# Patient Record
Sex: Female | Born: 1935
Health system: Southern US, Community
[De-identification: ages and names within clinical notes are randomized; demographics above are authoritative.]

## PROBLEM LIST (undated history)

## (undated) DIAGNOSIS — H35372 Puckering of macula, left eye: Secondary | ICD-10-CM

## (undated) DIAGNOSIS — E785 Hyperlipidemia, unspecified: Secondary | ICD-10-CM

## (undated) DIAGNOSIS — R51 Headache: Secondary | ICD-10-CM

## (undated) DIAGNOSIS — K222 Esophageal obstruction: Secondary | ICD-10-CM

## (undated) DIAGNOSIS — R269 Unspecified abnormalities of gait and mobility: Secondary | ICD-10-CM

## (undated) DIAGNOSIS — Z8719 Personal history of other diseases of the digestive system: Secondary | ICD-10-CM

## (undated) DIAGNOSIS — R519 Headache, unspecified: Secondary | ICD-10-CM

## (undated) DIAGNOSIS — R0600 Dyspnea, unspecified: Secondary | ICD-10-CM

## (undated) DIAGNOSIS — M810 Age-related osteoporosis without current pathological fracture: Secondary | ICD-10-CM

## (undated) DIAGNOSIS — I10 Essential (primary) hypertension: Secondary | ICD-10-CM

## (undated) DIAGNOSIS — E119 Type 2 diabetes mellitus without complications: Secondary | ICD-10-CM

## (undated) DIAGNOSIS — K219 Gastro-esophageal reflux disease without esophagitis: Secondary | ICD-10-CM

## (undated) DIAGNOSIS — M1990 Unspecified osteoarthritis, unspecified site: Secondary | ICD-10-CM

## (undated) DIAGNOSIS — I499 Cardiac arrhythmia, unspecified: Secondary | ICD-10-CM

## (undated) DIAGNOSIS — H269 Unspecified cataract: Secondary | ICD-10-CM

## (undated) DIAGNOSIS — I77810 Thoracic aortic ectasia: Secondary | ICD-10-CM

## (undated) DIAGNOSIS — H9319 Tinnitus, unspecified ear: Secondary | ICD-10-CM

## (undated) HISTORY — DX: Essential (primary) hypertension: I10

## (undated) HISTORY — DX: Unspecified abnormalities of gait and mobility: R26.9

## (undated) HISTORY — PX: APPENDECTOMY: SHX54

## (undated) HISTORY — PX: KYPHOSIS SURGERY: SHX114

## (undated) HISTORY — DX: Puckering of macula, left eye: H35.372

## (undated) HISTORY — PX: FRACTURE SURGERY: SHX138

## (undated) HISTORY — PX: BACK SURGERY: SHX140

## (undated) HISTORY — DX: Gastro-esophageal reflux disease without esophagitis: K21.9

## (undated) HISTORY — DX: Thoracic aortic ectasia: I77.810

## (undated) HISTORY — DX: Hyperlipidemia, unspecified: E78.5

## (undated) HISTORY — PX: EYE SURGERY: SHX253

## (undated) HISTORY — PX: SHOULDER SURGERY: SHX246

## (undated) HISTORY — DX: Unspecified cataract: H26.9

## (undated) HISTORY — PX: ABDOMINAL HYSTERECTOMY: SHX81

## (undated) HISTORY — DX: Esophageal obstruction: K22.2

## (undated) HISTORY — DX: Age-related osteoporosis without current pathological fracture: M81.0

## (undated) HISTORY — DX: Unspecified osteoarthritis, unspecified site: M19.90

## (undated) HISTORY — PX: TOTAL KNEE ARTHROPLASTY: SHX125

## (undated) HISTORY — DX: Type 2 diabetes mellitus without complications: E11.9

## (undated) HISTORY — PX: LUMBAR FUSION: SHX111

---

## 1991-08-26 HISTORY — PX: CHOLECYSTECTOMY: SHX55

## 1998-05-30 ENCOUNTER — Encounter: Admission: RE | Admit: 1998-05-30 | Discharge: 1998-08-28 | Payer: Self-pay | Admitting: Orthopaedic Surgery

## 1998-08-31 ENCOUNTER — Encounter: Payer: Self-pay | Admitting: Orthopaedic Surgery

## 1998-09-04 ENCOUNTER — Inpatient Hospital Stay (HOSPITAL_COMMUNITY): Admission: RE | Admit: 1998-09-04 | Discharge: 1998-09-10 | Payer: Self-pay | Admitting: Orthopaedic Surgery

## 1998-11-20 ENCOUNTER — Encounter: Admission: RE | Admit: 1998-11-20 | Discharge: 1999-02-18 | Payer: Self-pay | Admitting: Orthopaedic Surgery

## 2000-03-13 ENCOUNTER — Ambulatory Visit (HOSPITAL_COMMUNITY): Admission: RE | Admit: 2000-03-13 | Discharge: 2000-03-13 | Payer: Self-pay | Admitting: Gastroenterology

## 2001-08-24 ENCOUNTER — Ambulatory Visit (HOSPITAL_COMMUNITY): Admission: RE | Admit: 2001-08-24 | Discharge: 2001-08-24 | Payer: Self-pay | Admitting: Family Medicine

## 2001-08-24 ENCOUNTER — Encounter: Payer: Self-pay | Admitting: Family Medicine

## 2001-10-08 ENCOUNTER — Encounter: Payer: Self-pay | Admitting: Neurosurgery

## 2001-10-12 ENCOUNTER — Inpatient Hospital Stay (HOSPITAL_COMMUNITY): Admission: RE | Admit: 2001-10-12 | Discharge: 2001-10-17 | Payer: Self-pay | Admitting: Neurosurgery

## 2001-10-12 ENCOUNTER — Encounter: Payer: Self-pay | Admitting: Neurosurgery

## 2002-08-25 HISTORY — PX: KNEE ARTHROSCOPY: SUR90

## 2002-09-21 ENCOUNTER — Encounter: Payer: Self-pay | Admitting: Orthopaedic Surgery

## 2002-09-22 ENCOUNTER — Inpatient Hospital Stay (HOSPITAL_COMMUNITY): Admission: RE | Admit: 2002-09-22 | Discharge: 2002-09-27 | Payer: Self-pay | Admitting: Orthopaedic Surgery

## 2004-07-02 ENCOUNTER — Ambulatory Visit: Payer: Self-pay | Admitting: Cardiology

## 2004-07-04 ENCOUNTER — Ambulatory Visit: Payer: Self-pay

## 2007-05-18 ENCOUNTER — Encounter: Admission: RE | Admit: 2007-05-18 | Discharge: 2007-05-18 | Payer: Self-pay | Admitting: Neurosurgery

## 2007-05-22 ENCOUNTER — Encounter: Admission: RE | Admit: 2007-05-22 | Discharge: 2007-05-22 | Payer: Self-pay | Admitting: Neurosurgery

## 2007-08-05 ENCOUNTER — Ambulatory Visit: Payer: Self-pay | Admitting: Cardiology

## 2007-08-23 ENCOUNTER — Encounter: Payer: Self-pay | Admitting: Cardiology

## 2007-08-23 ENCOUNTER — Ambulatory Visit: Payer: Self-pay

## 2007-09-16 ENCOUNTER — Encounter: Admission: RE | Admit: 2007-09-16 | Discharge: 2007-09-16 | Payer: Self-pay | Admitting: Neurosurgery

## 2008-06-13 ENCOUNTER — Ambulatory Visit (HOSPITAL_COMMUNITY): Admission: RE | Admit: 2008-06-13 | Discharge: 2008-06-13 | Payer: Self-pay | Admitting: Orthopedic Surgery

## 2008-08-25 HISTORY — PX: CARDIAC CATHETERIZATION: SHX172

## 2008-12-12 DIAGNOSIS — E1169 Type 2 diabetes mellitus with other specified complication: Secondary | ICD-10-CM | POA: Insufficient documentation

## 2008-12-12 DIAGNOSIS — M1990 Unspecified osteoarthritis, unspecified site: Secondary | ICD-10-CM | POA: Insufficient documentation

## 2008-12-12 DIAGNOSIS — K219 Gastro-esophageal reflux disease without esophagitis: Secondary | ICD-10-CM

## 2008-12-12 DIAGNOSIS — E785 Hyperlipidemia, unspecified: Secondary | ICD-10-CM | POA: Insufficient documentation

## 2008-12-12 DIAGNOSIS — I152 Hypertension secondary to endocrine disorders: Secondary | ICD-10-CM | POA: Insufficient documentation

## 2008-12-12 DIAGNOSIS — I10 Essential (primary) hypertension: Secondary | ICD-10-CM

## 2008-12-13 ENCOUNTER — Ambulatory Visit: Payer: Self-pay | Admitting: Cardiology

## 2008-12-28 ENCOUNTER — Encounter: Admission: RE | Admit: 2008-12-28 | Discharge: 2008-12-28 | Payer: Self-pay | Admitting: Neurosurgery

## 2009-01-18 ENCOUNTER — Ambulatory Visit (HOSPITAL_COMMUNITY): Admission: RE | Admit: 2009-01-18 | Discharge: 2009-01-18 | Payer: Self-pay | Admitting: Neurosurgery

## 2009-03-09 ENCOUNTER — Encounter: Payer: Self-pay | Admitting: Cardiology

## 2009-04-11 ENCOUNTER — Encounter (INDEPENDENT_AMBULATORY_CARE_PROVIDER_SITE_OTHER): Payer: Self-pay | Admitting: *Deleted

## 2009-04-19 ENCOUNTER — Encounter: Admission: RE | Admit: 2009-04-19 | Discharge: 2009-04-19 | Payer: Self-pay | Admitting: Neurosurgery

## 2009-06-27 ENCOUNTER — Ambulatory Visit: Payer: Self-pay | Admitting: Cardiology

## 2009-06-27 DIAGNOSIS — R0602 Shortness of breath: Secondary | ICD-10-CM

## 2009-06-27 DIAGNOSIS — I719 Aortic aneurysm of unspecified site, without rupture: Secondary | ICD-10-CM | POA: Insufficient documentation

## 2009-12-10 ENCOUNTER — Encounter (HOSPITAL_COMMUNITY): Admission: RE | Admit: 2009-12-10 | Discharge: 2010-03-07 | Payer: Self-pay | Admitting: Family Medicine

## 2009-12-12 ENCOUNTER — Ambulatory Visit: Payer: Self-pay | Admitting: Cardiology

## 2009-12-12 DIAGNOSIS — I2510 Atherosclerotic heart disease of native coronary artery without angina pectoris: Secondary | ICD-10-CM

## 2010-01-03 ENCOUNTER — Ambulatory Visit: Payer: Self-pay

## 2010-01-03 ENCOUNTER — Ambulatory Visit: Payer: Self-pay | Admitting: Internal Medicine

## 2010-01-03 ENCOUNTER — Ambulatory Visit (HOSPITAL_COMMUNITY): Admission: RE | Admit: 2010-01-03 | Discharge: 2010-01-03 | Payer: Self-pay | Admitting: Cardiology

## 2010-01-03 ENCOUNTER — Encounter: Payer: Self-pay | Admitting: Cardiology

## 2010-02-20 ENCOUNTER — Encounter (INDEPENDENT_AMBULATORY_CARE_PROVIDER_SITE_OTHER): Payer: Self-pay | Admitting: Internal Medicine

## 2010-02-20 ENCOUNTER — Inpatient Hospital Stay (HOSPITAL_COMMUNITY): Admission: EM | Admit: 2010-02-20 | Discharge: 2010-02-21 | Payer: Self-pay | Admitting: Emergency Medicine

## 2010-02-20 ENCOUNTER — Ambulatory Visit: Payer: Self-pay | Admitting: Internal Medicine

## 2010-02-21 ENCOUNTER — Telehealth: Payer: Self-pay | Admitting: Cardiology

## 2010-02-26 ENCOUNTER — Encounter: Payer: Self-pay | Admitting: Cardiology

## 2010-02-27 ENCOUNTER — Ambulatory Visit: Payer: Self-pay | Admitting: Cardiology

## 2010-02-27 DIAGNOSIS — R002 Palpitations: Secondary | ICD-10-CM

## 2010-03-05 ENCOUNTER — Telehealth (INDEPENDENT_AMBULATORY_CARE_PROVIDER_SITE_OTHER): Payer: Self-pay | Admitting: *Deleted

## 2010-03-06 ENCOUNTER — Encounter: Payer: Self-pay | Admitting: Cardiology

## 2010-03-06 ENCOUNTER — Ambulatory Visit: Payer: Self-pay

## 2010-03-06 ENCOUNTER — Encounter (HOSPITAL_COMMUNITY): Admission: RE | Admit: 2010-03-06 | Discharge: 2010-05-01 | Payer: Self-pay | Admitting: Cardiology

## 2010-03-06 ENCOUNTER — Ambulatory Visit: Payer: Self-pay | Admitting: Cardiology

## 2010-03-18 ENCOUNTER — Telehealth: Payer: Self-pay | Admitting: Cardiology

## 2010-09-24 NOTE — Progress Notes (Signed)
Summary: Nuclear Pre-Procedure  Phone Note Outgoing Call Call back at [facility] Phone [phone]   Call placed by: Stanton Kidney, EMT-P,  March 05, 2010 2:45 PM Call placed to: Patient Action Taken: Phone Call Completed Summary of Call: Reviewed information on Myoview Information Sheet (see scanned document for further details).  Spoke with Patient.    Nuclear Med Background Indications for Stress Test: Evaluation for Ischemia, Post Hospital  Indications Comments: 02/19/10  CP/Palps  History: Abnormal EKG, Echo, Myocardial Perfusion Study  History Comments: 12/08 MPS: EF=75%, (-) ischemia 5/11 Echo=50%  Symptoms: Chest Pressure, Palpitations    Nuclear Pre-Procedure Cardiac Risk Factors: Family History - CAD, Hypertension, Lipids Height (in): 65

## 2010-09-24 NOTE — Progress Notes (Signed)
Summary: test results  Phone Note Call from Patient Call back at Home Phone [phone]   Caller: Patient Summary of Call: test results Initial call taken by: Judie Grieve,  March 18, 2010 10:34 AM  Follow-up for Phone Call        Phone Call Completed. PT AWARE OF MYOVIEW RESULTS. Follow-up by: Scherrie Bateman, LPN,  March 18, 2010 10:59 AM

## 2010-09-24 NOTE — Assessment & Plan Note (Signed)
Summary: [facility]   Visit Type:  Follow-up Primary Provider:  Dr Vernon Prey  CC:  palpitations.  History of Present Illness: The patient presents after her recent hospitalization. She was admitted with palpitations and was noted to have PACs. She did have some chest discomfort and chest pressure as well. An outpatient stress perfusion study was suggested but has not yet been done. She did have an echocardiogram suggesting that her ejection fraction was slightly reduced at 50%. 6 weeks earlier this had been 60%. She did rule out for myocardial infarction however. Since going home she has had no further palpitations and no presyncope or syncope. She has had no chest pressure, neck or arm discomfort. She does feel "jittery". She has had some intermittent chest pressure but cannot quantify or qualify this. She has not been particularly active. Of note she took herself off her antidepressant recently as she "didn't think she needed it."  Current Medications (verified): 1)  Diltiazem Hcl Cr 180 Mg Xr24h-Cap (Diltiazem Hcl) .Marland Kitchen.. 1 By Mouth Daily 2)  Lisinopril-Hydrochlorothiazide 20-12.5 Mg Tabs (Lisinopril-Hydrochlorothiazide) .Marland Kitchen.. 1 By Mouth Daily 3)  Citalopram Hydrobromide 20 Mg Tabs (Citalopram Hydrobromide) .Marland Kitchen.. 1 By Mouth Daily 4)  Furosemide 20 Mg Tabs (Furosemide) .... As Needed 5)  Aspirin 81 Mg  Tabs (Aspirin) .Marland Kitchen.. 1 By Mouth Daily 6)  Fish Oil 1000 Mg Caps (Omega-3 Fatty Acids) .... 2 By Mouth Daily 7)  Calcium 600 Mg Tabs (Calcium) .... Daily 8)  Multivitamins   Tabs (Multiple Vitamin) .Marland Kitchen.. 1 By Mouth Daily 9)  Vitamin C 500 Mg Tabs (Ascorbic Acid) .Marland Kitchen.. 1 By Mouth Daily 10)  Vitamin D3 1000 Unit Tabs (Cholecalciferol) .... 2 By Mouth Daily 11)  Cinnamon 500 Mg Caps (Cinnamon) .... 2 By Mouth Daily 12)  Dexilant 60 Mg Cpdr (Dexlansoprazole) .Marland Kitchen.. 1 By Mouth Daily 13)  Glucophage 500 Mg Tabs (Metformin Hcl) .Marland Kitchen.. 1 By Mouth Two Times A Day 14)  Crestor 5 Mg Tabs (Rosuvastatin  Calcium) .Marland Kitchen.. 1 By Mouth Daily 15)  Tramadol Hcl 50 Mg Tabs (Tramadol Hcl) .Marland Kitchen.. 1 By Mouth As Needed  Allergies (verified): 1)  ! Sulfa 2)  ! Codeine 3)  ! Vancomycin  Past History:  Past Medical History: Reviewed history from 06/27/2009 and no changes required.  1. Hypertension.   2. Esophageal stricture status post dilatation.   3. Osteoarthritis.   4. Gastroesophageal reflux disease.   5. Right breast cyst.   6. Dyslipidemia.   7. Mild Ao root dilatation  Past Surgical History: Reviewed history from 12/12/2009 and no changes required. Partial hysterectomy Cholecystectomy Left  total knee replacement, Right shoulder surgery Right and left knee arthroscopies Lumbar back fusion 2000 Kyphoplasty  Review of Systems       As stated in the HPI and negative for all other systems.   Vital Signs:  Patient profile:   77 year old female Height:      65 inches Weight:      174 pounds BMI:     29.06 Pulse rate:   87 / minute Resp:     18 per minute BP sitting:   124 / 78  (right arm)  Vitals Entered By: Marrion Coy, CNA (February 27, 2010 2:11 PM)  Physical Exam  General:  Well developed, well nourished, in no acute distress. Head:  normocephalic and atraumatic Eyes:  PERRLA/EOM intact; conjunctiva and lids normal. Mouth:  edentulousgums and palate normal. Oral mucosa normal. Neck:  Neck supple, no JVD. No masses, thyromegaly  or abnormal cervical nodes. Chest Wall:  no deformities or breast masses noted Lungs:  Clear bilaterally to auscultation and percussion. Abdomen:  Bowel sounds positive; abdomen soft and non-tender without masses, organomegaly, or hernias noted. No hepatosplenomegaly. Msk:  Back normal, normal gait. Muscle strength and tone normal. Extremities:  No clubbing or cyanosis. Neurologic:  Alert and oriented x 3. Skin:  Intact without lesions or rashes. Cervical Nodes:  no significant adenopathy Inguinal Nodes:  no significant adenopathy Psych:   depressed affect.  depressed affect.     Detailed Cardiovascular Exam  Neck    Carotids: Carotids full and equal bilaterally without bruits.      Neck Veins: Normal, no JVD.    Heart    Inspection: no deformities or lifts noted.      Palpation: normal PMI with no thrills palpable.      Auscultation: regular rate and rhythm, S1, S2 without murmurs, rubs, gallops, or clicks.    Vascular    Abdominal Aorta: no palpable masses, pulsations, or audible bruits.      Femoral Pulses: normal femoral pulses bilaterally.      Pedal Pulses: normal pedal pulses bilaterally.      Radial Pulses: normal radial pulses bilaterally.      Peripheral Circulation: no clubbing, cyanosis, or edema noted with normal capillary refill.     EKG  Procedure date:  02/27/2010  Findings:      Srhythm, rate 87, leftward axis, anteroseptal infarct  Impression & Recommendations:  Problem # 1:  CAD (ICD-414.00) Tt has had some chest discomfort and the EF is reduced apparently on the most recent echo. Therefore, stress perfusion imaging is indicated to see if there are any high risk findings.  Orders: EKG w/ Interpretation (93000) Nuclear Stress Test (Nuc Stress Test)  Problem # 2:  AORTIC ANEURYSM (ICD-441.9) She had very mild aortic root dilatation on the last echo. No further evaluation is suggested.  Problem # 3:  PALPITATIONS (ICD-785.1) Her palpitations have improved. Therefore, we will continue on the meds as listed.  Problem # 4:  HYPERTENSION (ICD-401.9) Her blood pressure is controlled. She will continue the meds as listed. Orders: EKG w/ Interpretation (93000)  Patient Instructions: 1)  Your physician recommends that you schedule a follow-up appointment in: 6 month  2)  Your physician recommends that you continue on your current medications as directed. Please refer to the Current Medication list given to you today. 3)  Your physician has requested that you have an exercise stress myoview.   For further information please visit [email or web address]  Please follow instruction sheet, as given.

## 2010-09-24 NOTE — Assessment & Plan Note (Signed)
Summary: rov/jml   Visit Type:  Follow-up Primary Provider:  Dr Vernon Prey  CC:  Ao Aneurysm and Coronary Calcification.  History of Present Illness: The patient presents for followup of the above. Since I last saw her she has had no new cardiovascular complaints. She has occasional chest pain but thinks it's related to her hiatal hernia. She has some shortness of breath with activities but this has not progressed. She is able to do her chores of daily living. She denies any resting complaints such as PND or orthopnea. She denies any palpitations, presyncope or syncope. She does have emotional stress as one of her daughters is battling breast cancer.  Current Medications (verified): 1)  Diltiazem Hcl Cr 180 Mg Xr24h-Cap (Diltiazem Hcl) .Marland Kitchen.. 1 By Mouth Daily 2)  Lisinopril-Hydrochlorothiazide 20-12.5 Mg Tabs (Lisinopril-Hydrochlorothiazide) .Marland Kitchen.. 1 By Mouth Daily 3)  Citalopram Hydrobromide 20 Mg Tabs (Citalopram Hydrobromide) .... 1/2 By Mouth Daily 4)  Furosemide 20 Mg Tabs (Furosemide) .... As Needed 5)  Aspirin 81 Mg  Tabs (Aspirin) .Marland Kitchen.. 1 By Mouth Daily 6)  Fish Oil 1000 Mg Caps (Omega-3 Fatty Acids) .... 2 By Mouth Daily 7)  Calcium 600 Mg Tabs (Calcium) .... Daily 8)  Multivitamins   Tabs (Multiple Vitamin) .Marland Kitchen.. 1 By Mouth Daily 9)  Vitamin C 500 Mg Tabs (Ascorbic Acid) .Marland Kitchen.. 1 By Mouth Daily 10)  Vitamin D3 1000 Unit Tabs (Cholecalciferol) .... 2 By Mouth Daily 11)  Cinnamon 500 Mg Caps (Cinnamon) .... 2 By Mouth Daily 12)  Dexilant 60 Mg Cpdr (Dexlansoprazole) .Marland Kitchen.. 1 By Mouth Daily 13)  Glucophage 500 Mg Tabs (Metformin Hcl) .Marland Kitchen.. 1 By Mouth Daily  Allergies (verified): 1)  ! Sulfa 2)  ! Codeine 3)  ! Vancomycin  Past History:  Past Medical History: Reviewed history from 06/27/2009 and no changes required.  1. Hypertension.   2. Esophageal stricture status post dilatation.   3. Osteoarthritis.   4. Gastroesophageal reflux disease.   5. Right breast cyst.   6. Dyslipidemia.    7. Mild Ao root dilatation  Past Surgical History: Partial hysterectomy Cholecystectomy Left  total knee replacement, Right shoulder surgery Right and left knee arthroscopies Lumbar back fusion 2000 Kyphoplasty  Review of Systems       Back Pain, Neck Pain, Fatigue,Stress.  Otherwise as stated in the history of present illness negative for all other systems.  Vital Signs:  Patient profile:   77 year old female Height:      65 inches Weight:      180 pounds BMI:     30.06 Pulse rate:   83 / minute Resp:     18 per minute BP sitting:   142 / 78  (right arm)  Vitals Entered By: Marrion Coy, CNA (December 12, 2009 3:13 PM)  Physical Exam  General:  Well developed, well nourished, in no acute distress. Head:  normocephalic and atraumatic Eyes:  PERRLA/EOM intact; conjunctiva and lids normal. Neck:  Neck supple, no JVD. No masses, thyromegaly or abnormal cervical nodes. Chest Wall:  no deformities or breast masses noted Lungs:  Clear bilaterally to auscultation and percussion. Heart:  Non-displaced PMI, chest non-tender; regular rate and rhythm, S1, S2 without murmurs, rubs or gallops. Carotid upstroke normal, no bruit. Normal abdominal aortic size, no bruits. Femorals normal pulses, no bruits. Pedals normal pulses. No edema, no varicosities. Abdomen:  Bowel sounds positive; abdomen soft and non-tender without masses, organomegaly, or hernias noted. No hepatosplenomegaly. Msk:  Back normal, normal gait. Muscle strength  and tone normal. Extremities:  No clubbing or cyanosis. Neurologic:  Alert and oriented x 3. Psych:  Normal affect.   EKG  Procedure date:  12/12/2009  Findings:      sinus rhythm, rate 83, leftward axis, poor anterior R-wave progression, no acute ST-T wave changes  Impression & Recommendations:  Problem # 1:  AORTIC ANEURYSM (ICD-441.9) The patient has a mild aortic root dilatation which I will keep benign with an echocardiogram. I did ask the question  as to whether she would even be interested in surgery if it ever came to that and she said she would consider it.  Problem # 2:  HYPERTENSION (ICD-401.9) Her blood pressure is at the upper limits of normal. She will continue the meds as listed. Orders: Echocardiogram (Echo)  Problem # 3:  CAD (ICD-414.00) She has some mild coronary calcification. She had a stress perfusion study in 2008 and no new symptoms. No further testing is suggested.  Other Orders: EKG w/ Interpretation (93000)  Patient Instructions: 1)  Your physician recommends that you schedule a follow-up appointment in: 1 year with Dr Antoine Poche in [place] 2)  Your physician recommends that you continue on your current medications as directed. Please refer to the Current Medication list given to you today. 3)  Your physician has requested that you have an echocardiogram.  Echocardiography is a painless test that uses sound waves to create images of your heart. It provides your doctor with information about the size and shape of your heart and how well your heart's chambers and valves are working.  This procedure takes approximately one hour. There are no restrictions for this procedure.

## 2010-09-24 NOTE — Assessment & Plan Note (Signed)
Summary: Cardiology Nuclear Study  Nuclear Med Background Indications for Stress Test: Evaluation for Ischemia, Post Hospital  Indications Comments: 02/19/10  Chest pain/palps  History: Abnormal EKG, Echo, Myocardial Perfusion Study  History Comments: '08 MPS:no ischemia, EF=75%; 5/11 Echo=50%; h/o AAA  Symptoms: Chest Pressure, Chest Pressure with Exertion, Diaphoresis, Dizziness, DOE, Fatigue, Palpitations  Symptoms Comments: Last episode of WG:NFAO since D/C.   Nuclear Pre-Procedure Cardiac Risk Factors: Family History - CAD, Hypertension, Lipids, NIDDM, PVD Caffeine/Decaff Intake: None NPO After: 8:00 PM Lungs: Clear.  O2 Sat 98% on RA. IV 0.9% NS with Angio Cath: 24g     IV Site: (R) Wrist IV Started by: Irean Hong RN Chest Size (in) 40     Cup Size B     Height (in): 65 Weight (lb): 171 BMI: 28.56  Nuclear Med Study 1 or 2 day study:  1 day     Stress Test Type:  Eugenie Birks Reading MD:  Marca Ancona, MD     Referring MD:  Rollene Rotunda, MD Resting Radionuclide:  Technetium 66m Tetrofosmin     Resting Radionuclide Dose:  11.0 mCi  Stress Radionuclide:  Technetium 66m Tetrofosmin     Stress Radionuclide Dose:  33.0 mCi   Stress Protocol   Lexiscan: 0.4 mg   Stress Test Technologist:  Rea College CMA-N     Nuclear Technologist:  Domenic Polite CNMT  Rest Procedure  Myocardial perfusion imaging was performed at rest 45 minutes following the intravenous administration of Myoview Technetium 47m Tetrofosmin.  Stress Procedure  The patient received IV Lexiscan 0.4 mg over 15-seconds.  Myoview injected at 30-seconds.  There were no significant changes with infusion.  She did c/o chest tightness and jaw pain with infusion.  Quantitative spect images were obtained after a 45 minute delay.  QPS Raw Data Images:  Normal; no motion artifact; normal heart/lung ratio. Stress Images:  NI: Uniform and normal uptake of tracer in all myocardial segments. Rest Images:  Normal  homogeneous uptake in all areas of the myocardium. Subtraction (SDS):  There is no evidence of scar or ischemia. Transient Ischemic Dilatation:  1.0  (Normal <1.22)  Lung/Heart Ratio:  .27  (Normal <0.45)  Quantitative Gated Spect Images QGS EDV:  76 ml QGS ESV:  20 ml QGS EF:  74 % QGS cine images:  Normal wall motion.    Overall Impression  Exercise Capacity: Lexiscan study.  BP Response: Normal blood pressure response. Clinical Symptoms: Chest tightness.  ECG Impression: No significant ST segment change suggestive of ischemia. Overall Impression: Normal stress nuclear study.  Appended Document: Cardiology Nuclear Study  lmtc 3:45 03/11/2010 No evidence of ischemia.

## 2010-09-24 NOTE — Miscellaneous (Signed)
  Clinical Lists Changes  Observations: Added new observation of ECHOINTERP:  - Left ventricle: Wall thickness was at the upper limits of normal.       Systolic function was normal. The estimated ejection fraction was       in the range of 55% to 60%. Wall motion was normal; there were no       regional wall motion abnormalities. Doppler parameters are       consistent with abnormal left ventricular relaxation (grade 1       diastolic dysfunction). Doppler parameters are consistent with       high ventricular filling pressure.     - Left atrium: The atrium was mildly dilated.     - Atrial septum: There was an atrial septal aneurysm.        (01/03/2010 9:41)      Echocardiogram  Procedure date:  01/03/2010  Findings:       - Left ventricle: Wall thickness was at the upper limits of normal.       Systolic function was normal. The estimated ejection fraction was       in the range of 55% to 60%. Wall motion was normal; there were no       regional wall motion abnormalities. Doppler parameters are       consistent with abnormal left ventricular relaxation (grade 1       diastolic dysfunction). Doppler parameters are consistent with       high ventricular filling pressure.     - Left atrium: The atrium was mildly dilated.     - Atrial septum: There was an atrial septal aneurysm.

## 2010-09-24 NOTE — Progress Notes (Signed)
Summary: still havinf palpatations since hospital d/c  Phone Note Call from Patient   Caller: Daughter Patsi Sears [phone] Reason for Call: Talk to Nurse Summary of Call: pt d/c from hosptial w/note to callto set up 1-2 week follow up with dr Tamala Julian is booked and the pa couldn't see her until 7-28-dtr said pt still having the palpatations-what to do? dtr wanda east [phone] Initial call taken by: Glynda Jaeger,  February 21, 2010 4:55 PM  Follow-up for Phone Call        spoke with daughter d/c from [facility] 02/21/10 with instructions to see Dr Antoine Poche in 1-2 weeks. Our schedulers told her it would be the end of the month before he could see her.  I will foward to [facility] and let her call the daughter back tomorrow with a time Dennis Bast, RN, BSN  February 21, 2010 6:43 PM  Additional Follow-up for Phone Call Additional follow up Details #1::        pt can be seen 7/18 At 9 am with Dr Antoine Poche in [place] or if she wants to be seen in [place] we can see her 02/27/10 at 10:45.  Marcelino Duster will call pt/family Additional Follow-up by: Charolotte Capuchin, RN,  February 22, 2010 2:44 PM

## 2010-11-10 LAB — DIFFERENTIAL
Basophils Relative: 1 % (ref 0–1)
Eosinophils Absolute: 0.1 10*3/uL (ref 0.0–0.7)
Neutro Abs: 7.1 10*3/uL (ref 1.7–7.7)
Neutrophils Relative %: 55 % (ref 43–77)

## 2010-11-10 LAB — GLUCOSE, CAPILLARY
Glucose-Capillary: 129 mg/dL — ABNORMAL HIGH (ref 70–99)
Glucose-Capillary: 134 mg/dL — ABNORMAL HIGH (ref 70–99)
Glucose-Capillary: 139 mg/dL — ABNORMAL HIGH (ref 70–99)

## 2010-11-10 LAB — POCT CARDIAC MARKERS: CKMB, poc: <1 ng/mL — ABNORMAL LOW (ref 1.0–8.0)

## 2010-11-10 LAB — CBC
Hemoglobin: 14.3 g/dL (ref 12.0–15.0)
MCH: 30.7 pg (ref 26.0–34.0)
MCH: 31.4 pg (ref 26.0–34.0)
MCHC: 34.4 g/dL (ref 30.0–36.0)
MCV: 91.2 fL (ref 78.0–100.0)
Platelets: 237 10*3/uL (ref 150–400)
RBC: 4.05 MIL/uL (ref 3.87–5.11)
RBC: 4.64 MIL/uL (ref 3.87–5.11)
RDW: 15.2 % (ref 11.5–15.5)

## 2010-11-10 LAB — POCT I-STAT, CHEM 8
Calcium, Ion: 1.02 mmol/L — ABNORMAL LOW (ref 1.12–1.32)
Creatinine, Ser: 1.1 mg/dL (ref 0.4–1.2)
Hemoglobin: 14.6 g/dL (ref 12.0–15.0)
Potassium: 3.9 mEq/L (ref 3.5–5.1)
TCO2: 22 mmol/L (ref 0–100)

## 2010-11-10 LAB — CARDIAC PANEL(CRET KIN+CKTOT+MB+TROPI)
CK, MB: 2.8 ng/mL (ref 0.3–4.0)
Relative Index: 2.5 (ref 0.0–2.5)
Total CK: 107 U/L (ref 7–177)
Troponin I: 0.01 ng/mL (ref 0.00–0.06)
Troponin I: 0.02 ng/mL (ref 0.00–0.06)

## 2010-11-10 LAB — MRSA PCR SCREENING: MRSA by PCR: NEGATIVE

## 2010-11-10 LAB — CK TOTAL AND CKMB (NOT AT ARMC): Relative Index: 2.5 (ref 0.0–2.5)

## 2010-11-10 LAB — TSH: TSH: 3.607 u[IU]/mL (ref 0.350–4.500)

## 2010-11-10 LAB — BASIC METABOLIC PANEL
CO2: 28 mEq/L (ref 19–32)
Calcium: 8.3 mg/dL — ABNORMAL LOW (ref 8.4–10.5)
Creatinine, Ser: 0.8 mg/dL (ref 0.4–1.2)
GFR calc Af Amer: >60 mL/min (ref >60)
Glucose, Bld: 138 mg/dL — ABNORMAL HIGH (ref 70–99)

## 2010-11-10 LAB — TROPONIN I: Troponin I: 0.01 ng/mL (ref 0.00–0.06)

## 2010-12-03 LAB — BASIC METABOLIC PANEL
BUN: 13 mg/dL (ref 6–23)
CO2: 26 mEq/L (ref 19–32)
Chloride: 101 mEq/L (ref 96–112)
Creatinine, Ser: 0.83 mg/dL (ref 0.4–1.2)

## 2010-12-03 LAB — CBC
MCHC: 34.4 g/dL (ref 30.0–36.0)
MCV: 90.2 fL (ref 78.0–100.0)
Platelets: 301 10*3/uL (ref 150–400)

## 2010-12-24 ENCOUNTER — Encounter (HOSPITAL_BASED_OUTPATIENT_CLINIC_OR_DEPARTMENT_OTHER)
Admission: RE | Admit: 2010-12-24 | Discharge: 2010-12-24 | Disposition: A | Discharge status: Home / Self Care | Payer: Medicare Other | Source: Ambulatory Visit | Attending: Orthopedic Surgery | Admitting: Orthopedic Surgery

## 2010-12-24 HISTORY — PX: RADIAL HEAD ARTHROPLASTY: SUR75

## 2010-12-24 LAB — BASIC METABOLIC PANEL
BUN: 15 mg/dL (ref 6–23)
CO2: 23 mEq/L (ref 19–32)
Calcium: 9.5 mg/dL (ref 8.4–10.5)
Chloride: 102 mEq/L (ref 96–112)
Creatinine, Ser: 0.78 mg/dL (ref 0.4–1.2)
GFR calc Af Amer: >60 mL/min (ref >60)
GFR calc non Af Amer: >60 mL/min (ref >60)
Glucose, Bld: 97 mg/dL (ref 70–99)
Potassium: 3.7 mEq/L (ref 3.5–5.1)
Sodium: 139 mEq/L (ref 135–145)

## 2010-12-25 ENCOUNTER — Ambulatory Visit (HOSPITAL_BASED_OUTPATIENT_CLINIC_OR_DEPARTMENT_OTHER)
Admission: RE | Admit: 2010-12-25 | Discharge: 2010-12-25 | Disposition: A | Discharge status: Home / Self Care | Payer: Medicare Other | Source: Ambulatory Visit | Attending: Orthopedic Surgery | Admitting: Orthopedic Surgery

## 2010-12-25 DIAGNOSIS — Z01818 Encounter for other preprocedural examination: Secondary | ICD-10-CM | POA: Insufficient documentation

## 2010-12-25 DIAGNOSIS — S52123A Displaced fracture of head of unspecified radius, initial encounter for closed fracture: Secondary | ICD-10-CM | POA: Insufficient documentation

## 2010-12-25 DIAGNOSIS — Z01812 Encounter for preprocedural laboratory examination: Secondary | ICD-10-CM | POA: Insufficient documentation

## 2010-12-25 DIAGNOSIS — I10 Essential (primary) hypertension: Secondary | ICD-10-CM | POA: Insufficient documentation

## 2010-12-25 DIAGNOSIS — E119 Type 2 diabetes mellitus without complications: Secondary | ICD-10-CM | POA: Insufficient documentation

## 2010-12-25 DIAGNOSIS — Z79899 Other long term (current) drug therapy: Secondary | ICD-10-CM | POA: Insufficient documentation

## 2010-12-25 DIAGNOSIS — X58XXXA Exposure to other specified factors, initial encounter: Secondary | ICD-10-CM | POA: Insufficient documentation

## 2010-12-25 LAB — GLUCOSE, CAPILLARY
Glucose-Capillary: 109 mg/dL — ABNORMAL HIGH (ref 70–99)
Glucose-Capillary: 131 mg/dL — ABNORMAL HIGH (ref 70–99)

## 2011-01-01 NOTE — Op Note (Signed)
  NAME:  Debra Phillips, Debra Phillips             ACCOUNT NO.:  [id number]  MEDICAL RECORD NO.:  [id number]           PATIENT TYPE:  O  LOCATION:                                FACILITY:  [facility]  PHYSICIAN:  Paris Hohn A. Byrd Terrero, M.D.DATE OF BIRTH:  [birth year]  DATE OF PROCEDURE:  12/25/2010 DATE OF DISCHARGE:  12/25/2010                              OPERATIVE REPORT   PREOPERATIVE DIAGNOSIS:  Comminuted intra-articular fracture radial head right elbow.  POSTOPERATIVE DIAGNOSIS:  Comminuted intra-articular fracture radial head right elbow.  PROCEDURE:  Radial head replacement using Align 20 x 8 radial head.  SURGEONS:  Artist Pais. Mina Marble, MD and Dr. Merlyn Lot.  ANESTHESIA:  General and axillary block.  TOURNIQUET TIME:  45 minutes.  COMPLICATIONS:  None.  DRAINS:  None.  DESCRIPTION:  The patient was taken to the operating suite.  After induction of adequate axillary block analgesia and general anesthetic, right upper extremity was prepped and draped in sterile fashion.  An Esmarch was used to exsanguinate the limb.  Tourniquet was inflated to 275 mmHg.  At this point in time, incision was made on the lateral aspect of the right elbow and incision was carried down to the skin and subcutaneous tissues.  The interval between the [facility] and ECRL was identified and split.  Palpate was used to determine the radiocapitellar joint.  This was incised anteriorly.  The fracture site was identified. There was a comminuted fracture of the radial head with three large fragments that were carefully excised comprising about 45% of the radial head.  At this point in time since operative fixation was not attainable, the radial head was replaced using Align radial head replacement.  A neck cut was made using alignment jig.  The intramedullary canal was broached using a bur that broached up to an 8 mm.  Trialing was then used using an 8-mm stem and a 20-mm head which have been predetermined.  Intraoperative  fluoroscopy revealed good placement and good rotation both with full flexion, extension, pronation, supination.  The wound was then thoroughly irrigated.  Using the external alignment guide, the radial head __________ was placed and secured with the locking screw.  Intraoperative fluoroscopy then revealed adequate reduction in AP, lateral and oblique view.  Good placement of the components.  The wound was irrigated and loosely closed in layers of 0 Vicryl to close the capsule and deeper layers followed by 3-0 Prolene subcuticular stitch on the skin.  Steri-Strips, 4x4s, fluffs and posterior elbow splint was applied.  The patient tolerated the procedure well and went to recovery room in stable fashion.     Artist Pais Mina Marble, M.D.     MAW/MEDQ  D:  12/25/2010  T:  12/26/2010  Job:  [id number]  Electronically Signed by Dairl Ponder M.D. on 01/01/2011 09:26:40 AM
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
## 2011-01-07 NOTE — Assessment & Plan Note (Signed)
[facility]                            CARDIOLOGY OFFICE NOTE   STARKISHA, TULLIS                    MRN:          [id number]  DATE:08/05/2007                            DOB:          May 28, 1929    PRIMARY CARE PHYSICIAN:  Dr. Rudi Heap.   REASON FOR PRESENTATION:  Evaluate patient with progressive dyspnea.   HISTORY OF PRESENT ILLNESS:  The patient is 77 years old.  I last saw  her over 3 years ago.  She had some chest discomfort and had a stress-  perfusion study which was negative for any evidence of ischemia and an  EF of 65%. Since I last saw her she did have a cellulitis at [facility].  Somehow during that admission they found an abdominal aortic  aneurysm that she thinks is greater than 4 cm.  This was in May and she  has had followup recently and was told it was unchanged.   She is referred here because of dyspnea. This was with any activity.  She says she gets short of breath walking a few hundred feet from her  mailbox.  This has been progressive over a year.  She sometimes has to  stop on the way in.  She does not have any dyspnea when she is resting.  She is not describing PND or orthopnea.  She is not describing  palpitations, presyncope or syncope.   She does get chest discomfort.  This does not happen at the same time as  the dyspnea however.  She says this comes on as an aching discomfort.  She stops what she is doing and it goes away.  This seems to be more of  a stable pattern.  She says this happens at rest or with activity.  There was no radiation to her jaw or to her arms.  She has had no  associated nausea, vomiting or diaphoresis.   PAST MEDICAL HISTORY:  1. Hypertension.  2. Esophageal stricture status post dilatation.  3. Osteoarthritis.  4. Gastroesophageal reflux disease.  5. Right breast cyst.  6. Dyslipidemia.   PAST SURGICAL HISTORY:  Partial hysterectomy, cholecystectomy, left  total knee  replacement, right shoulder surgery, right and left knee  arthroscopies.   ALLERGIES:  CODEINE.   MEDICATIONS:  1. Zestoretic 20/12.5.  2. Diltiazem 180 mg daily.  3. Lexapro 10 mg daily.  4. Actos 30 mg daily.  5. Lescol.  6. Coenzyme-Q.  7. Aspirin 81 mg daily.  8. Multivitamin.  9. Calcium.  10.Nexium.  11.Mobic.  12.Lasix p.r.n.   SOCIAL HISTORY:  The patient is a retired Scientist, product/process development. She does not  smoke cigarettes. She is married and has five children.   FAMILY HISTORY:  Noncontributory for early coronary artery disease.   REVIEW OF SYSTEMS:  As stated in the HPI and otherwise negative for  other systems.   PHYSICAL EXAMINATION:  GENERAL:  The patient is in no distress.  VITAL SIGNS:  Blood pressure 125/74, heart rate 68 and regular, weight  203 pounds, body mass index 33.  HEENT:  Eyelids unremarkable, pupils are equal, round and  reactive to  light, fundi within normal limits. Oral mucosa unremarkable.  NECK:  No jugular venous distension at 45 degrees, carotid upstroke  brisk and symmetric, no bruits, no thyromegaly.  LYMPHATICS:  No cervical, axillary, or inguinal adenopathy.  LUNGS:  Clear to auscultation bilaterally.  BACK:  No costovertebral angle tenderness.  CHEST:  Unremarkable.  HEART:  PMI not displaced or sustained, S1 and S2 within normal limits,  no S3, no S4, no clicks, no rubs, no murmurs.  ABDOMEN:  Flat, positive bowel sounds, normal frequency and pitch. No  bruits, no rebound, no guarding, no midline pulsatile mass, no  hepatomegaly, no hepatosplenomegaly.  SKIN:  No rashes.  EXTREMITIES:  Two+ pulses, mild bilateral lower extremity edema, no  cyanosis, no clubbing.  NEURO:  Oriented to person, place and time, cranial nerves II-XII  grossly intact, motor grossly intact.   EKG:  Sinus rhythm, rate 71, axis within normal limits, intervals within  normal limits, no acute ST, T wave changes.   ASSESSMENT AND PLAN:  1. Dyspnea and chest  discomfort. The patient has both complaints.  The      dyspnea seems to be very progressive.  Given this I think screening      with a exercise test would be reasonable.  The patient says she      would not be able to walk on a treadmill.  Therefore, she will have      an Adenosine perfusion study.  Also with the dyspnea and lower      extremity swelling, I would like to see an echocardiogram as well.      This will allow me to look for any occult valve problems, and      measure pulmonary pressures.  2. Abdominal aortic aneurysm.  The patient is asking me to review      these results and will try to get them from Dr. Christell Constant.  3. Hypertension.  Blood pressure is well controlled and she will      continue the medications as listed.  4. Anxiety/depression.  The patient has this managed by Dr. Christell Constant.  5. Followup.  I will see the patient back based on the results of the      above.     Rollene Rotunda, MD, [facility]  Electronically Signed    JH/MedQ  DD: 08/05/2007  DT: 08/05/2007  Job #: [id number]   cc:   Ernestina Penna, M.D.

## 2011-01-10 NOTE — Op Note (Signed)
[facility]  Patient:    Debra Phillips, Debra Phillips Visit Number: [id number] MRN: [id number]          Service Type: SUR Location: [facility] 3172 [id number] Attending Physician:  Emeterio Reeve Dictated by:   Payton Doughty, M.D. Proc. Date: 10/12/01 Admit Date:  10/12/2001                             Operative Report  PREOPERATIVE DIAGNOSIS:  Spondylosis with spondylolisthesis at L4-5.  POSTOPERATIVE DIAGNOSIS:  Spondylosis with spondylolisthesis at L4-5.  OPERATION PERFORMED:  L4-5 laminectomy, diskectomy and posterior lumbar interbody fusion with Ray threaded fusion cage and pedicle screw fixation with 90D system.  SURGEON:  Payton Doughty, M.D.  ANESTHESIA:  General endotracheal.  PREP:  Sterile Betadine prep and scrub with alcohol wipe.  COMPLICATIONS:  None.  ASSISTANT: 1. Cristi Loron, M.D. 2. Farley.  DESCRIPTION OF PROCEDURE:  The patient is a 77 year old right-handed white female with severe lumbar spondylosis and spinal stenosis at 4-5.  The patient was taken to the operating room, smoothly anesthetized and intubated, and placed prone on the operating table.  Following shave, prep and drape in the usual sterile fashion, the skin was infiltrated with 1% lidocaine, 1:400,000 epinephrine.  The skin was incised from the bottom of L5 to the middle of L3 and the lamina of L4 and L5 were exposed bilaterally in the subperiosteal plane.  Several intraoperative x-rays were used to ensure correctness of level because of the dense spondylitic nature of the problem.  Having confirmed correctness of the level, the pars interarticularis,  lamina and inferior facet of L4 and superior facet of L5 were reviewed using the high speed drill and the bone set aside for grafting.  The ligamentum flavum was removed bilaterally.  This uncovered the 4 and 5 roots as they traversed the disk space.  The slip was reduced to about 4 mm.  Having completed  decompression, 14 x 21 Ray threaded fusion cages were placed.  Intraoperative x-ray showed good placement of cages.  Using the standard landmarks, pedicle screws were then placed at L4 and L5 and they were 6.25 x 40 mm screws.  Having placed the screws, an intraoperative x-ray confirming good placement, rods were attached. The transverse processes were decorticated and bone graft laid out there. Bone graft from the facet joints was also packed into the Ray cages and they were capped. Rods were attached to the pedicle screws and tightened. Intraoperative x-ray of the finished product showed good placement of Ray cages, pedicle screws and rods.  The fascia was then reapproximated with 0 Vicryl suture in interrupted fashion and subcutaneous tissues reapproximated with 0 Vicryl in interrupted fashion.  Subcuticular tissues were reapproximated with 3-0 Vicryl in interrupted fashion.  The skin was closed with 3-0 nylon in running locked fashion.  Bacitracin and Telfa dressing was applied and made occlusive with Op-Site.  The patient returned to the recovery room in good condition.  Dictated by:   Payton Doughty, M.D. Attending Physician:  Emeterio Reeve DD:  10/12/01 TD:  10/12/01 Job: [id number]

## 2011-01-10 NOTE — Procedures (Signed)
[facility]  Patient:    Debra Phillips, Debra Phillips                 MRN: [id number] Proc. Date: 03/13/00 Adm. Date:  [id number] Disc. Date: [id number] Attending:  Louie Bun CC:         Gweneth Dimitri, M.D.                           Procedure Report  PROCEDURE:  Esophagogastroduodenoscopy with esophageal dilatation.  ENDOSCOPIST:  Everardo All. Madilyn Fireman, M.D.  ANESTHESIA:  INDICATIONS FOR PROCEDURE:  Intermittent solid food dysphagia in the patient who has responded in the past to dilatation and esophageal ring.  DESCRIPTION OF PROCEDURE:  The patient was placed in the left lateral decubitus position and placed on the pulse monitor with continuous low flow oxygen delivered by nasal cannula.  She was sedated with 50 mg of IV Demerol and 5 mg of IV Versed.  The Olympus video endoscope was advanced under direct vision into the oropharynx and esophagus.  The esophagus was straight and of normal caliber at the squamocolumnar line at 37 cm above a 1.5 cm hiatal hernia.  There is a clearly seen lower esophageal ring at the Z-line with a single small erosion extending about 1 cm upward from it with no exudate.  No other evidence of esophagitis or stricture.  The stomach was entered and there was about a 1.5 cm hiatal hernia with no erosions in the hernia sac. Retroflex view of the cardia confirmed the hernia was otherwise unremarkable. The fundus, body, antrum, and pylorus all appeared normal.  The duodenum was entered, and both the bulb and second portion were well-inspected and appeared to be within normal limits.  A Savary guidewire was placed through the endoscope channel and the scope withdrawn.  The Savary dilators of 15, 16, and 17 mm were passed effectively with mild resistance and moderate amount of blood noted on the last dilator only.  This dilator was removed together with the wire and the patient returned to the recovery room in stable condition.  She  tolerated the procedure well and there were no immediate complications.  IMPRESSION:  Lower esophageal ring with hiatal hernia, status post dilatation to 70 mm.  PLAN:  Advance diet and observe response to dilatation. DD:  03/13/00 TD:  03/16/00 Job: [id number]

## 2011-01-10 NOTE — H&P (Signed)
NAME:  Debra Phillips, Debra Phillips                       ACCOUNT NO.:  [id number]   MEDICAL RECORD NO.:  [id number]                   PATIENT TYPE:   LOCATION:                                       FACILITY:  [facility]   PHYSICIAN:  Claude Manges. Cleophas Dunker, M.D.            DATE OF BIRTH:  May 18, 1929   DATE OF ADMISSION:  09/22/2002  DATE OF DISCHARGE:                                HISTORY & PHYSICAL   CHIEF COMPLAINT:  Right knee pain for last 10 to 15 years.   HISTORY OF PRESENT ILLNESS:  [name] presents  to Dr. Cleophas Dunker with a history of a left knee replacement by Dr. Cleophas Dunker  on September 04, 1998.  She has done well from her left knee replacement but  has been having problems with the right knee for many years.  She has a  history of a right knee replacement by Dr. Cleophas Dunker on August 23, 1987,  and has had intermittent problems with the knee since that time.  She has  had no new injuries with her knee.   At this point the pain in the right knee is described as constant throbbing  at times which can be very sharp and seems to be diffuse about the joint  with radiation about the joint proximally into her hip and distally to her  foot.  The pain increases if she sits for a prolonged period of time and  then goes from a sitting to a standing position and then decreases if she  sits and elevates the knee.  The knee has locked in the past and does grind  but there is no popping, catching or giving way. The knee does swell and it  does keep her up at night at times.  She has received cortisone shots in the  past with minimal relief.  She is currently taking either Darvocet or  Percocet for pain that provides a mild amount of relief.  She was not  walking with any assistive devices.   ALLERGIES:  No known drug allergies.   CURRENT MEDICATIONS:  1. Zestoretic 20/12.5 mg one tablet p.o. q.a.m.  2. Verapamil 240 mg one tablet p.o. q.a.m.  3. Celebrex 200 mg one  tablet p.o. q.a.m.  4. Tricor 160 mg one tablet p.o. q.a.m.  5. Prevacid 30 mg one tablet p.o. q.a.m.  6. Darvocet-N 100 one to two p.o. q.6h. p.r.n. for pain.  7. Percocet 5/325 mg one to two p.o. q.4h. p.r.n. for pain.  8. Tylenol Arthritis one to two p.o. q.8h. p.r.n. for pain.  9. Multivitamin one tablet p.o. q.d.  10.      Os-Cal Plus D one tablet p.o. b.i.d.  11.      Vitamin E 400 i.u. one tablet p.o. q.d.  12.      Black Cohosh one tablet p.o. b.i.d. for hot flashes.  13.      Aspirin 81  mg one tablet p.o. q.d.   PAST MEDICAL HISTORY:  She has had hypertension for the last 10 to 12 years.  She has a hiatal hernia and gastroesophageal reflux disease and has had that  for about seven to nine years.  She denies any history of diabetes mellitus,  thyroid disease, peptic ulcer disease, heart disease, asthma or any other  chronic medical condition other than noted previously.   PAST SURGICAL HISTORY:  1. Partial hysterectomy with appendectomy in 1975 by Dr.  Donnalee Curry in     [place].  2. Right knee arthroscopy August 23, 1987, by Dr. Claude Manges. Whitfield.  3. Laparoscopic cholecystectomy March 15, 1992, by Dr. Elpidio Eric.  4. Open  rotator cuff repair by Dr. Claude Manges. Whitfield, August 1995.  5. Left knee arthroscopy July 1999 by Dr. Claude Manges. Whitfield.  6. Left total knee arthroplasty September 04, 1998, by Dr. Claude Manges. Whitfield.  7. Lumbar fusion February 2003, by Dr. Trey Sailors.   She denies any complications from the above mentioned procedures.   HABITS:  She denies any history of cigarette smoking, alcohol use or drug  use.   SOCIAL HISTORY:  She is married and has five children.  She and her husband  live in a one story house with no step into the main entrance.  Her husband  does have Alzheimer's and is progressing fairly quickly.  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  Her medical doctor is  Ernestina Penna, M.D., and his phone number is  [phone].   FAMILY HISTORY:  Her mother died at the age of 64 with pneumonia.  Her  father died at the age of 26 with brain cancer.  She has one brother alive  at age 58 with emphysema.  She has one brother who passed away at the age of  3 with lung cancer and heart disease and one who died at age 44 with liver  cancer.  She has a sister alive at age 57 with arthritis.  She has five  children; one age 49 with diabetes, son of 15, son 81, daughter 110, and  daughter 64.  The rest of her children are healthy.   REVIEW OF SYSTEMS:  She has bilateral cataracts and occasional tinnitus.  She has some arthritis in her back. She complains of occasional headaches  and sinus congestion with complaints of stiff neck.  She has a history of  palpitations which is treated with Verapamil.  She does have shortness of  breath at times and has problems with some swelling in her lower extremities  which seems to be worse in the summer.  This is treated with fluid pill.  She does complain of urinary frequency and urgency.  She is having some  problems with tendonitis in both shoulders.  She wears dentures and glasses.  She does have occasional neck pain.  All other systems are negative and  noncontributory.   PHYSICAL EXAMINATION:  GENERAL APPEARANCE:  A well-developed, well-  nourished, mildly overweight white female in no acute distress.  She walks  with a limp on the right.  Mood and affect are appropriate.  She talks  easily with the examiner.  VITAL SIGNS:  Height 5 feet 5 inches, weight 185 pounds, BMI is 30.  Temperature 96.9 degrees F, pulse 68, respiratory rate 20, blood pressure  124/82.  HEENT:  Normocephalic and atraumatic with frontal or maxillary sinus  tenderness to palpation.  Conjunctiva pink.  Sclerae are anicteric.  PERRLA.  EOMs intact.  No visible external ear deformities.  Hearing grossly intact.  Tympanic membranes pearly gray bilaterally with good light reflex.  Nose and nasal  septum midline.  Nasal mucosa pink and moist without exudate or polyps  noted.  Buckle mucosa pink and moist.  Good dentition. Pharynx without  erythema or exudate.  Tongue and uvula midline.  Tongue without  fasciculations and uvula rises equally with phonation.  NECK:  No visible masses or lesions noted.  Trachea midline.  No palpable  lymphadenopathy.  No thyromegaly.  Carotids +2 bilaterally without bruits.  RESPIRATORY:  Respirations even and unlabored.  Breath sounds clear to  auscultation bilaterally without rales or wheezes noted.  CARDIOVASCULAR:  Regular rate and rhythm.  S1 and S2 present without  murmurs, rubs, clicks, or gallops noted.  ABDOMEN:  Rounded abdominal contour.  Bowel sounds present x4 quadrants.  Soft and nontender to palpation without hepatosplenomegaly.  No CVA  tenderness.  Femoral pulses +2 bilaterally.  She has a well healed lower  lumbar incision line.  No pain with palpation along the entire length of the  vertebral column.  BREASTS/GU/RECTAL/PELVIC:  These examinations are deferred at this time.  MUSCULOSKELETAL:  She had no obvious deformities of the bilateral upper  extremities with the exception of a well healed transverse incision line  noted over her right shoulder.  She has full range of motion of her  bilateral upper extremities with just some pain with the extremes of forward  flexion and abduction of her shoulders. Radial pulses are +2 bilaterally.  She has full range of motion of her hips, ankles and toes bilaterally.  DP  and PT pulses are +2.  No lower extremity edema at this time and no calf  pain with palpation at this time.  Negative Homan's bilaterally.  Left knee  has a well-healed midline incision.  Skin is otherwise intact.  She has full  extension and flexion to 110 degrees without pain or crepitus. There is no  pain with palpation along the joint line and she is stable to varus and  valgus stress and has a negative anterior drawer.   Right knee has no obvious  deformity.  She is lacking probably 5 to 10 degrees of full extension but  can flex to 105 degrees.  There is a mild amount of crepitus with range of  motion of the knee and it is slow to bend at first and then seems to bend  better after you extend it a few times.  She does have pain with palpation  both the medial and lateral joint line at this time.  Minimal effusion in  the knee.  Moderate crepitus.  Stable to varus and valgus stress and  negative anterior drawer.  NEUROLOGIC:  She is alert and oriented x3.  Cranial nerves II-XII are  grossly intact.  Strength 5/5 bilateral upper and lower extremities.  Sensation intact to light touch.  Deep tendon reflexes 2+ bilateral upper  and lower extremities.  Rapid alternating movements intact.   RADIOLOGIC FINDINGS:  X-rays taken of her right knee in October of 2003  showed significant  spurring in the superior aspect of the patella.  There  is also narrow joint space in the medial compartment of the knee and several loose bodies noted in the posterior aspect of the knee.   IMPRESSION:  1. End-stage osteoarthritis right knee, status post left knee replacement in  January 2000.  2. Hypertension.  3. Hiatal hernia with gastroesophageal reflux disease.  4. Possible history of premature ventricular contractions  5. Tendinitis bilateral shoulders.   PLAN:  The patient will be admitted to [facility] on  September 22, 2002, where she will undergo a right total knee arthroplasty by  Dr. Claude Manges. Whitfield.  She will undergo all the routine preoperative  laboratory tests and studies prior to this procedure.      Legrand Pitts Duffy, P.A.                      Claude Manges. Cleophas Dunker, M.D.    KED/MEDQ  D:  09/14/2002  T:  09/14/2002  Job:  [id number]

## 2011-01-10 NOTE — H&P (Signed)
[facility]  Patient:    KABREA, SEENEY Visit Number: [id number] MRN: [id number]          Service Type: SUR Location: [facility] 3172 [id number] Attending Physician:  Emeterio Reeve Dictated by:   Payton Doughty, M.D. Admit Date:  10/12/2001                           History and Physical  ADMISSION DIAGNOSIS:  Spondylosis L4-5 with neurogenic claudication.  SERVICE:  Neurosurgery.  HISTORY OF PRESENT ILLNESS:  A 77 year old right-handed white female who, since November, has had a sharp increase in back pain, bilateral buttock pain with occasional pain radiating down her legs.  It is worse when she lays down. Her doctor obtained an MRI that demonstrated significant spondylosis and spondylolisthesis grade I bilateral L4 and L5 nerve root compression.  She was referred to me.  PAST MEDICAL HISTORY:  This is remarkable for hypertension.  PAST SURGICAL HISTORY:  She had a hysterectomy in the past, cholecystectomy, and right shoulder spur rotator cuff and left knee replacement.  MEDICATIONS: 1. Calan 240 mg a day. 2. Zestoretic 20/12.5 a day. 3. Premarin 0.625 mg a day. 4. Celebrex 200 mg a day. 5. Aspirin one a day.  ALLERGIES:  CODEINE and SULFA.  SOCIAL HISTORY:  She neither smokes nor drinks and is retired from [employer].  FAMILY HISTORY:  Both parents are deceased.  She has a diabetic son.  REVIEW OF SYSTEMS:  This is remarkable for glasses, tinnitus, nose bleeds, nasal congestion, occasional angina, hypertension, swelling of hands and feet, indigestion, longstanding stress incontinence, leg weakness, back pain, joint pain, arthritis and neck pain.  PHYSICAL EXAMINATION:  HEENT:  Within normal limits.  NECK:  She has regional range of motion of her neck.  CHEST:  Clear.  CARDIOVASCULAR:   Regular rate and rhythm.  ABDOMEN:  Nontender with no hepatosplenomegaly.  EXTREMITIES:  No clubbing or cyanosis.  Femoral pulses are good.  GU:   Deferred.  NEUROLOGICAL:  She is awake, alert and oriented.  Cranial nerves are intact. Motor exam shows 5/5 strength throughout the upper and lower extremities.  At this time when she walks, she feels as if her legs are giving out.  Reflexes are 2 at the right knee, 1 at the left, flicker at the ankles bilaterally. When she sits down, she feels more comfortable when she gets into a flexed position.  MRI shows diffuse spondylitic change much worse at 4/5 with a grade I slit and significant stenosis.  CLINICAL IMPRESSION:  Lumbar spondylosis with neurogenic claudication.  PLAN:  Lumbar laminectomy, discectomy, posterior lumbar interbody fusion, and augmentation with pedicle screws.  The risks and benefits of this approach have been discussed with her and her family and she wished to proceed. Dictated by:   Payton Doughty, M.D. Attending Physician:  Emeterio Reeve DD:  10/12/01 TD:  10/12/01 Job: [id number]

## 2011-01-10 NOTE — Discharge Summary (Signed)
[facility]  Patient:    Debra Phillips, Debra Phillips Visit Number: [id number] MRN: [id number]          Service Type: SUR Location: 3000 3015 01 Attending Physician:  Emeterio Reeve Dictated by:   Hewitt Shorts, M.D. Admit Date:  10/12/2001 Discharge Date: 10/17/2001                             Discharge Summary  HISTORY OF PRESENT ILLNESS:  The patient is a 77 year old woman who is a patient of Payton Doughty, M.D.  She was admitted for lumbar degeneration. Details of her admission history and physical are included in Dr. Emilie Rutter. Roys admission note.  HOSPITAL COURSE:  The patient was admitted and underwent an L4-5 lumbar decompression, Ray cage interbody fusion, and pedicle screw fixation. Postoperatively, she did well.  Her wound has healed nicely.  She is afebrile. Physical therapy and occupational therapy were consulted and worked with the patient throughout her hospitalization.  She had some difficulties with constipation which responded to treatment.  We have recommended home health aid, home health PT, and home health OT and these have been ordered.  The patient does have a rolling walker.  She is to return in one week for suture removal with Payton Doughty, M.D.  She has been instructed on wound care and activities following discharge.  She is to let us know if she has any problems in the meantime.  DISCHARGE MEDICATIONS:  A discharge prescription was given for Percocet one to two tablets p.o. q.4-6h. p.r.n. pain, 50 pills prescribed with no refills. She does have Celebrex at home and she was told to go ahead and use that. Dictated by:   Hewitt Shorts, M.D. Attending Physician:  Emeterio Reeve DD:  10/17/01 TD:  10/18/01 Job: [id number]

## 2011-01-10 NOTE — Discharge Summary (Signed)
NAME:  Debra Phillips, Debra Phillips                       ACCOUNT NO.:  [id number]   MEDICAL RECORD NO.:  [id number]                   PATIENT TYPE:  INP   LOCATION:  5027                                 FACILITY:  [facility]   PHYSICIAN:  Claude Manges. Cleophas Dunker, M.D.            DATE OF BIRTH:  [birth year]   DATE OF ADMISSION:  09/22/2002  DATE OF DISCHARGE:  09/27/2002                                 DISCHARGE SUMMARY   ADMISSION DIAGNOSES:  1. End-stage osteoarthritis right knee, status post left knee replacement     January 2000.  2. Hypertension.  3. Hiatal hernia with gastroesophageal reflux disease.  4. Tendinitis bilateral shoulders.  5. History of premature ventricular contractions.   DISCHARGE DIAGNOSES:  1. End-stage osteoarthritis right knee, status post right total knee     arthroplasty.  2. History of left total knee arthroplasty January 2000.  3. Acute blood loss anemia secondary to surgery.  4. Nausea secondary to patient-controlled anesthesia.  5. Constipation.  6. Hypertension.  7. Hiatal hernia with gastroesophageal reflux disease.  8. Tendinitis bilateral shoulders.  9. History of premature ventricular contractions.   SURGICAL PROCEDURE:  On September 22, 2002, the patient underwent a right  total knee arthroplasty by Dr. Claude Manges. Whitfield, assisted by Arnoldo Morale,  P.A.C.  She had a femoral size standard right LCS complete femoral placed  with an LCS complete MB patella cemented size standard, and then a DePuy MBT  keeled tibial tray size 4 cemented, and then an LCS complete RP insert size  standard 10 mm insert.   COMPLICATIONS:  None.   CONSULTATIONS:  1. Pharmacy consult for Coumadin therapy September 22, 2002.  2. Physical therapy, rehabilitation medicine consult September 23, 2002.  3. Occupational therapy consult September 24, 2002.   HISTORY OF PRESENT ILLNESS:  [name] presented  to Dr. Cleophas Dunker with complaints of right knee pain.  She  had a left knee  replacement in January 2000.  The right knee has a constant throbbing  sensation which can be sharp and diffuse about the joint with radiation into  the joint proximally into her hip and distally to her foot.  The pain  increases with prolonged sitting and decreases with sitting in elevation.  The knee locks and grinds.  She has failed conservative treatment and,  because of that, she is presenting for a right total knee arthroplasty.   HOSPITAL COURSE:  The patient tolerated her surgical procedure well without  immediate postoperative complications.  She was transferred to 5000.  She  did have some postoperative nausea attributed to the PCA, and medicines were  adjusted.  Hemoglobin was 10, hematocrit 29.1.  She was started on PT per  protocol.   The rest of her postoperative course remained basically unremarkable.  She  was started on physical therapy and switched to p.o. pain medications.  She  had a mild elevated temperature on September 26, 2002, with t-max of 100.1.  Her hemoglobin remained slightly low at 8.9 with hematocrit of 26.  She did  not require a transfusion and was started on iron.  She did have some  difficulty with constipation which were treated with a laxative.  On  September 27, 2002, it was felt she was stable enough for discharge home and  was discharged home at that time.   DISCHARGE INSTRUCTIONS:  1. Diet:  She can resume her regular prehospitalization diet.  2. She can resume her home medications except for Celebrex, Tylenol     Arthritis, aspirin, and Darvocet.  These medications include:     A. Zestoretic 20/12.5 mg p.o. q.a.m.     B. Verapamil 240 mg p.o. q.a.m.     C. Tricor 160 mg p.o. q.a.m.     D. Prevacid 30 mg p.o. q.a.m.     E. Multivitamin p.o. daily.     F. Os-Cal + D 1 tablet p.o. b.i.d.     G. Vitamin E 400 international units p.o. daily.     H. Black cohosh 1 tablet p.o. b.i.d.  3. Additional medications given at this time  would be:     A. OxyContin 10 mg 1 p.o. q.12h.     B. Percocet 5/325 mg 1-2 p.o. q.4-6h. p.r.n. for pain.     C. Iron supplement 1 tablet p.o. b.i.d. for a month.     D. Coumadin, to take as directed by the pharmacy.  4. Activity:  She is to be out of bed, 50% weightbearing on the right leg or     less with the use of the walker.  She is arranged for home health PT,     R.N., and pharmacy per [facility].  She is arranged for     home CPM 0-90 degrees six to eight hours a day.  5. Wound care:  She needs to keep the right knee incision clean and dry.     She may shower when no drainage from the wound for two days.  She is to     notify Dr. Cleophas Dunker for temperature greater than or equal to 101.5     degrees Fahrenheit, chills, pain unrelieved by pain medications, or foul-     smelling drainage from the wound.  6. Follow-up:  She needs to follow up with Dr. Cleophas Dunker in our office in     approximately 7-10 days and is to call [phone] for that appointment.   LABORATORY DATA:  Chest x-ray on October 08, 2001, showed a tortuous aorta  but no lung pathology.   On September 23, 2002, hemoglobin 10, hematocrit 29.1.  On September 24, 2002,  white count 12.4, hemoglobin 9.7, hematocrit 28.6.  On September 25, 2002,  hemoglobin 9.3, hematocrit 26.9.  On September 26, 2002, hemoglobin 8.9,  hematocrit 26.   On September 23, 2002, PT 15.3, INR 1.2.  On September 27, 2002, PT 19.4, INR  1.8.   On September 21, 2002, sodium 132, potassium 3.8.  On September 23, 2002,  glucose 130.  On September 24, 2002, glucose 141.  All other laboratory  studies were within normal limits.     Debra Phillips, P.A.                      Claude Manges. Cleophas Dunker, M.D.    KED/MEDQ  D:  11/10/2002  T:  11/11/2002  Job:  [id number]   cc:   Dorinda Hill  Hoy Register, M.D.  [address] [place]  [place] [address]  Fax: [phone]

## 2011-01-10 NOTE — Procedures (Signed)
[facility]  Patient:    Debra Phillips, Debra Phillips                 MRN: [id number] Proc. Date: 03/13/00 Adm. Date:  [id number] Disc. Date: [id number] Attending:  Louie Bun CC:         Gweneth Dimitri, M.D.                           Procedure Report  PROCEDURE:  Colonoscopy.  ENDOSCOPIST:  Everardo All. Madilyn Fireman, M.D.  ANESTHESIA:  INDICATIONS FOR PROCEDURE:  Screening colonoscopy requested by primary care physician in a patient with no recent colon imaging.  DESCRIPTION OF PROCEDURE:  The patient was placed in the left lateral decubitus position and placed on the pulse monitor with continuous low flow oxygen delivered by nasal cannula.  She was sedated with 2 mg of IV Versed and 20 mg of IV Demerol in addition to the 50 mg of Demerol and 5 mg of Versed given for the previous EGD.  The Olympus video colonoscope was inserted into the rectum and advanced to the cecum, confirmed by transillumination at McBurneys point and visualization of the ileocecal valve and appendiceal orifice.  The prep was good.  The cecum, ascending, transverse, descending, and sigmoid colon all appeared normal with no masses, polyps, diverticuli, or other mucosal abnormalities.  The rectum also appeared normal.  Retroflexed view of the anus revealed no obvious internal hemorrhoids.  The colonoscope was then withdrawn and the patient returned to the recovery room in stable condition.  She tolerated the procedure well and there were no immediate complications.  IMPRESSION:  Essentially normal colonoscopy.  PLAN:  Consider repeat colonoscopy in 10 years or flexible sigmoidoscopy in five years. DD:  03/13/00 TD:  03/16/00 Job: [id number]

## 2011-01-10 NOTE — Op Note (Signed)
NAME:  Debra Phillips, Debra Phillips                       ACCOUNT NO.:  [id number]   MEDICAL RECORD NO.:  [id number]                   PATIENT TYPE:  INP   LOCATION:  NA                                   FACILITY:  [facility]   PHYSICIAN:  Claude Manges. Cleophas Dunker, M.D.            DATE OF BIRTH:  [birth year]   DATE OF PROCEDURE:  09/22/2002  DATE OF DISCHARGE:                                 OPERATIVE REPORT   PREOPERATIVE DIAGNOSIS:  Osteoarthritis of the right knee.   POSTOPERATIVE DIAGNOSIS:  Osteoarthritis of the right knee.   OPERATION PERFORMED:  Right total knee replacement.   SURGEON:  Claude Manges. Cleophas Dunker, M.D.   ASSISTANT:  Legrand Pitts. Duffy, P.A.   ANESTHESIA:  General orotracheal anesthesia.   COMPLICATIONS:  None.   COMPONENTS:  Depuy LCS complete standard femoral and #4 rotating keel tibial  platform with a 10 mm polyethylene bridging bearing and a three-peg metal  back rotating patella.  All were secured with polymethyl methacrylate.   DESCRIPTION OF PROCEDURE:  With the patient comfortable on the operating  table and under general orotracheal anesthesia, the nursing staff inserted a  Foley catheter.  The right lower extremity tourniquet was applied to the  right thigh.  The right leg was then prepped with Betadine scrub and then  DuraPrep from the tourniquet to the midfoot.  Sterile draping was performed.  With the extremity still elevated, it was Esmarch exsanguinated with the  proximal tourniquet at 350 mmHg.   A midline longitudinal incision was made centered about the patella  extending from the superior pouch to the tibial tubercle.  By sharp  dissection, the incision was carried down through subcutaneous tissue to the  first layer of the capsule.  A medial parapatellar incision was made through  the deep capsule with the Bovie.  There was a small clear yellow joint  effusion.  The patella was everted 180 degrees, the knee flexed to 90  degrees.  There were large osteophytes  along the medial and lateral femoral  condyle and along the patella.  There were multiple loose bodies.  We  removed a total of five or six, the largest of which was an inch in  diameter.  There were actually two in that size range in the superior pouch.  There were three or four that were removed from the posterior aspect of the  medial and lateral femoral condyle that measured about 1 cm in diameter.  There was complete absence of articular cartilage along the medial  compartment.  There was not a fixed varus position.  Medial release was not  necessary.   Preoperatively, we have measured a standard femoral component either a  number 3 or 4 tibial tray, a number 4 was confirmed intraoperatively.  The  appropriate femoral and tibial jigs were then applied to make the  appropriate femoral and tibial cuts.  ACL and PCL were sacrificed.  MCL and  LCL remained intact.  Laminar spreader was inserted to remove remnants of  the ACL and PCL as well as the medial and lateral menisci and osteophytes  along the medial and lateral femoral condyle.  A 7 degree posterior  inclination was used on the tibia, a 4 degree distal femoral valgus cut was  utilized, 10 mm flexion-extension gaps were perfectly symmetrical.   The trial components were inserted including the #4 rotating tibial platform  with a 10 mm bridging bearing and the standard femoral component.  We had an  excellent range of motion.  There was no malrotation of the tibial tray.  The patella was then prepared by removing 8 mm of bone, leaving  approximately 12 mm of patellar thickness.  The tri peg guide was utilized.  The three holes were made and the trial patella was applied.  The entire  trial construct was then placed with a full range of motion and the patella  did not sublux laterally.   The trial components were removed.  The joint was then copiously irrigated  with jet saline antibiotic solution.  All loose material was removed.   The  knee was then flexed.  The retractors were inserted.  The final components  were secured with polymethyl methacrylate.  After complete maturation,  extraneous methacrylate was removed with an osteotome.  We had excellent  position of the components and again with a full range of motion there was  no malrotation of movement of any of the components.  The patella tracked in  the midline.   The tourniquet was deflated.  Bleeders were Bovie coagulated.  A Hemovac was  not necessary.  The deep capsule was closed with interrupted  #1 Ethibond.  Superficial capsule closed with a running 0 Vicryl, the subcu with 2-0  Vicryl.  The skin closed with skin clips.  Sterile bulky dressing was  applied followed by the patient's support stocking.  A postoperative femoral  nerve block was to be performed by Dr. Ivin Booty for pain control.  The patient  tolerated the procedure without complications.  There was excellent  capillary refill to the right foot.                                                Claude Manges. Cleophas Dunker, M.D.    PWW/MEDQ  D:  09/22/2002  T:  09/22/2002  Job:  [id number]

## 2011-04-01 ENCOUNTER — Encounter: Payer: Self-pay | Admitting: Cardiology

## 2011-04-07 ENCOUNTER — Ambulatory Visit: Payer: Medicare Other | Attending: Family Medicine | Admitting: Physical Therapy

## 2011-04-07 DIAGNOSIS — R5381 Other malaise: Secondary | ICD-10-CM | POA: Insufficient documentation

## 2011-04-07 DIAGNOSIS — IMO0001 Reserved for inherently not codable concepts without codable children: Secondary | ICD-10-CM | POA: Insufficient documentation

## 2011-04-07 DIAGNOSIS — M25629 Stiffness of unspecified elbow, not elsewhere classified: Secondary | ICD-10-CM | POA: Insufficient documentation

## 2011-04-07 DIAGNOSIS — M25529 Pain in unspecified elbow: Secondary | ICD-10-CM | POA: Insufficient documentation

## 2011-04-10 ENCOUNTER — Ambulatory Visit: Payer: Medicare Other | Admitting: *Deleted

## 2011-04-15 ENCOUNTER — Ambulatory Visit: Payer: Medicare Other | Admitting: Physical Therapy

## 2011-04-17 ENCOUNTER — Ambulatory Visit: Payer: Medicare Other | Admitting: Physical Therapy

## 2011-04-22 ENCOUNTER — Ambulatory Visit: Payer: Medicare Other | Admitting: Physical Therapy

## 2011-04-24 ENCOUNTER — Ambulatory Visit: Payer: Medicare Other | Admitting: *Deleted

## 2011-04-30 ENCOUNTER — Ambulatory Visit: Payer: Medicare Other | Attending: Family Medicine | Admitting: Physical Therapy

## 2011-04-30 DIAGNOSIS — R5381 Other malaise: Secondary | ICD-10-CM | POA: Insufficient documentation

## 2011-04-30 DIAGNOSIS — M25529 Pain in unspecified elbow: Secondary | ICD-10-CM | POA: Insufficient documentation

## 2011-04-30 DIAGNOSIS — IMO0001 Reserved for inherently not codable concepts without codable children: Secondary | ICD-10-CM | POA: Insufficient documentation

## 2011-04-30 DIAGNOSIS — M25629 Stiffness of unspecified elbow, not elsewhere classified: Secondary | ICD-10-CM | POA: Insufficient documentation

## 2011-05-06 ENCOUNTER — Ambulatory Visit: Payer: Medicare Other | Admitting: Physical Therapy

## 2011-05-08 ENCOUNTER — Ambulatory Visit: Payer: Medicare Other | Admitting: *Deleted

## 2011-05-12 ENCOUNTER — Encounter: Payer: Medicare Other | Admitting: Physical Therapy

## 2011-05-14 ENCOUNTER — Ambulatory Visit: Payer: Medicare Other | Admitting: Physical Therapy

## 2011-05-20 ENCOUNTER — Ambulatory Visit: Payer: Medicare Other | Admitting: Physical Therapy

## 2011-05-22 ENCOUNTER — Ambulatory Visit: Payer: Medicare Other | Admitting: Physical Therapy

## 2011-05-26 LAB — BASIC METABOLIC PANEL
BUN: 11
CO2: 30
Calcium: 9.4
Chloride: 103
Creatinine, Ser: 0.79
GFR calc Af Amer: >60
GFR calc non Af Amer: >60
Glucose, Bld: 126 — ABNORMAL HIGH
Potassium: 3.8
Sodium: 138

## 2011-09-01 DIAGNOSIS — M47817 Spondylosis without myelopathy or radiculopathy, lumbosacral region: Secondary | ICD-10-CM | POA: Diagnosis not present

## 2011-09-01 DIAGNOSIS — N390 Urinary tract infection, site not specified: Secondary | ICD-10-CM | POA: Diagnosis not present

## 2011-09-01 DIAGNOSIS — R300 Dysuria: Secondary | ICD-10-CM | POA: Diagnosis not present

## 2011-09-12 DIAGNOSIS — Q649 Congenital malformation of urinary system, unspecified: Secondary | ICD-10-CM | POA: Diagnosis not present

## 2011-09-24 DIAGNOSIS — M19019 Primary osteoarthritis, unspecified shoulder: Secondary | ICD-10-CM | POA: Diagnosis not present

## 2011-09-24 DIAGNOSIS — IMO0002 Reserved for concepts with insufficient information to code with codable children: Secondary | ICD-10-CM | POA: Diagnosis not present

## 2011-09-24 DIAGNOSIS — M48061 Spinal stenosis, lumbar region without neurogenic claudication: Secondary | ICD-10-CM | POA: Diagnosis not present

## 2011-09-24 DIAGNOSIS — M47817 Spondylosis without myelopathy or radiculopathy, lumbosacral region: Secondary | ICD-10-CM | POA: Diagnosis not present

## 2011-09-24 DIAGNOSIS — M25519 Pain in unspecified shoulder: Secondary | ICD-10-CM | POA: Diagnosis not present

## 2011-09-29 DIAGNOSIS — M48061 Spinal stenosis, lumbar region without neurogenic claudication: Secondary | ICD-10-CM | POA: Diagnosis not present

## 2011-09-29 DIAGNOSIS — M19019 Primary osteoarthritis, unspecified shoulder: Secondary | ICD-10-CM | POA: Diagnosis not present

## 2011-09-29 DIAGNOSIS — M47817 Spondylosis without myelopathy or radiculopathy, lumbosacral region: Secondary | ICD-10-CM | POA: Diagnosis not present

## 2011-09-29 DIAGNOSIS — IMO0002 Reserved for concepts with insufficient information to code with codable children: Secondary | ICD-10-CM | POA: Diagnosis not present

## 2011-10-02 ENCOUNTER — Ambulatory Visit: Payer: Medicare Other | Attending: Anesthesiology | Admitting: Physical Therapy

## 2011-10-02 DIAGNOSIS — R5381 Other malaise: Secondary | ICD-10-CM | POA: Diagnosis not present

## 2011-10-02 DIAGNOSIS — M25519 Pain in unspecified shoulder: Secondary | ICD-10-CM | POA: Insufficient documentation

## 2011-10-02 DIAGNOSIS — IMO0001 Reserved for inherently not codable concepts without codable children: Secondary | ICD-10-CM | POA: Insufficient documentation

## 2011-10-02 DIAGNOSIS — M25619 Stiffness of unspecified shoulder, not elsewhere classified: Secondary | ICD-10-CM | POA: Diagnosis not present

## 2011-10-07 ENCOUNTER — Ambulatory Visit: Payer: Medicare Other | Admitting: Physical Therapy

## 2011-10-07 DIAGNOSIS — R5381 Other malaise: Secondary | ICD-10-CM | POA: Diagnosis not present

## 2011-10-07 DIAGNOSIS — IMO0001 Reserved for inherently not codable concepts without codable children: Secondary | ICD-10-CM | POA: Diagnosis not present

## 2011-10-07 DIAGNOSIS — M25619 Stiffness of unspecified shoulder, not elsewhere classified: Secondary | ICD-10-CM | POA: Diagnosis not present

## 2011-10-07 DIAGNOSIS — M25519 Pain in unspecified shoulder: Secondary | ICD-10-CM | POA: Diagnosis not present

## 2011-10-09 ENCOUNTER — Ambulatory Visit: Payer: Medicare Other | Admitting: Physical Therapy

## 2011-10-09 DIAGNOSIS — M25519 Pain in unspecified shoulder: Secondary | ICD-10-CM | POA: Diagnosis not present

## 2011-10-09 DIAGNOSIS — R5381 Other malaise: Secondary | ICD-10-CM | POA: Diagnosis not present

## 2011-10-09 DIAGNOSIS — M25619 Stiffness of unspecified shoulder, not elsewhere classified: Secondary | ICD-10-CM | POA: Diagnosis not present

## 2011-10-09 DIAGNOSIS — IMO0001 Reserved for inherently not codable concepts without codable children: Secondary | ICD-10-CM | POA: Diagnosis not present

## 2011-10-14 ENCOUNTER — Ambulatory Visit: Payer: Medicare Other | Admitting: Physical Therapy

## 2011-10-14 DIAGNOSIS — M25519 Pain in unspecified shoulder: Secondary | ICD-10-CM | POA: Diagnosis not present

## 2011-10-14 DIAGNOSIS — E559 Vitamin D deficiency, unspecified: Secondary | ICD-10-CM | POA: Diagnosis not present

## 2011-10-14 DIAGNOSIS — E785 Hyperlipidemia, unspecified: Secondary | ICD-10-CM | POA: Diagnosis not present

## 2011-10-14 DIAGNOSIS — R5381 Other malaise: Secondary | ICD-10-CM | POA: Diagnosis not present

## 2011-10-14 DIAGNOSIS — IMO0001 Reserved for inherently not codable concepts without codable children: Secondary | ICD-10-CM | POA: Diagnosis not present

## 2011-10-14 DIAGNOSIS — E119 Type 2 diabetes mellitus without complications: Secondary | ICD-10-CM | POA: Diagnosis not present

## 2011-10-14 DIAGNOSIS — M25619 Stiffness of unspecified shoulder, not elsewhere classified: Secondary | ICD-10-CM | POA: Diagnosis not present

## 2011-10-14 DIAGNOSIS — I10 Essential (primary) hypertension: Secondary | ICD-10-CM | POA: Diagnosis not present

## 2011-10-16 ENCOUNTER — Ambulatory Visit: Payer: Medicare Other | Admitting: Physical Therapy

## 2011-10-16 DIAGNOSIS — M25619 Stiffness of unspecified shoulder, not elsewhere classified: Secondary | ICD-10-CM | POA: Diagnosis not present

## 2011-10-16 DIAGNOSIS — IMO0001 Reserved for inherently not codable concepts without codable children: Secondary | ICD-10-CM | POA: Diagnosis not present

## 2011-10-16 DIAGNOSIS — M25519 Pain in unspecified shoulder: Secondary | ICD-10-CM | POA: Diagnosis not present

## 2011-10-16 DIAGNOSIS — R5381 Other malaise: Secondary | ICD-10-CM | POA: Diagnosis not present

## 2011-10-20 ENCOUNTER — Ambulatory Visit: Payer: Medicare Other | Admitting: Physical Therapy

## 2011-10-20 DIAGNOSIS — R5381 Other malaise: Secondary | ICD-10-CM | POA: Diagnosis not present

## 2011-10-20 DIAGNOSIS — IMO0001 Reserved for inherently not codable concepts without codable children: Secondary | ICD-10-CM | POA: Diagnosis not present

## 2011-10-20 DIAGNOSIS — M25619 Stiffness of unspecified shoulder, not elsewhere classified: Secondary | ICD-10-CM | POA: Diagnosis not present

## 2011-10-20 DIAGNOSIS — M25519 Pain in unspecified shoulder: Secondary | ICD-10-CM | POA: Diagnosis not present

## 2011-10-22 ENCOUNTER — Ambulatory Visit: Payer: Medicare Other | Admitting: Physical Therapy

## 2011-10-27 ENCOUNTER — Ambulatory Visit: Payer: Medicare Other | Attending: Anesthesiology | Admitting: Physical Therapy

## 2011-10-27 DIAGNOSIS — M25519 Pain in unspecified shoulder: Secondary | ICD-10-CM | POA: Insufficient documentation

## 2011-10-27 DIAGNOSIS — M25619 Stiffness of unspecified shoulder, not elsewhere classified: Secondary | ICD-10-CM | POA: Diagnosis not present

## 2011-10-27 DIAGNOSIS — IMO0001 Reserved for inherently not codable concepts without codable children: Secondary | ICD-10-CM | POA: Insufficient documentation

## 2011-10-27 DIAGNOSIS — R5381 Other malaise: Secondary | ICD-10-CM | POA: Insufficient documentation

## 2011-10-29 DIAGNOSIS — M47817 Spondylosis without myelopathy or radiculopathy, lumbosacral region: Secondary | ICD-10-CM | POA: Diagnosis not present

## 2011-10-29 DIAGNOSIS — M48061 Spinal stenosis, lumbar region without neurogenic claudication: Secondary | ICD-10-CM | POA: Diagnosis not present

## 2011-10-29 DIAGNOSIS — IMO0002 Reserved for concepts with insufficient information to code with codable children: Secondary | ICD-10-CM | POA: Diagnosis not present

## 2011-10-29 DIAGNOSIS — M1990 Unspecified osteoarthritis, unspecified site: Secondary | ICD-10-CM | POA: Diagnosis not present

## 2011-10-30 ENCOUNTER — Ambulatory Visit: Payer: Medicare Other | Admitting: *Deleted

## 2011-10-30 DIAGNOSIS — R5381 Other malaise: Secondary | ICD-10-CM | POA: Diagnosis not present

## 2011-10-30 DIAGNOSIS — IMO0001 Reserved for inherently not codable concepts without codable children: Secondary | ICD-10-CM | POA: Diagnosis not present

## 2011-10-30 DIAGNOSIS — M25619 Stiffness of unspecified shoulder, not elsewhere classified: Secondary | ICD-10-CM | POA: Diagnosis not present

## 2011-10-30 DIAGNOSIS — M25519 Pain in unspecified shoulder: Secondary | ICD-10-CM | POA: Diagnosis not present

## 2011-10-31 DIAGNOSIS — K219 Gastro-esophageal reflux disease without esophagitis: Secondary | ICD-10-CM | POA: Diagnosis not present

## 2011-11-04 ENCOUNTER — Ambulatory Visit: Payer: Medicare Other | Admitting: Physical Therapy

## 2011-11-04 DIAGNOSIS — IMO0001 Reserved for inherently not codable concepts without codable children: Secondary | ICD-10-CM | POA: Diagnosis not present

## 2011-11-04 DIAGNOSIS — M25519 Pain in unspecified shoulder: Secondary | ICD-10-CM | POA: Diagnosis not present

## 2011-11-04 DIAGNOSIS — N3289 Other specified disorders of bladder: Secondary | ICD-10-CM | POA: Diagnosis not present

## 2011-11-04 DIAGNOSIS — R5381 Other malaise: Secondary | ICD-10-CM | POA: Diagnosis not present

## 2011-11-04 DIAGNOSIS — N952 Postmenopausal atrophic vaginitis: Secondary | ICD-10-CM | POA: Diagnosis not present

## 2011-11-04 DIAGNOSIS — N3941 Urge incontinence: Secondary | ICD-10-CM | POA: Diagnosis not present

## 2011-11-04 DIAGNOSIS — M25619 Stiffness of unspecified shoulder, not elsewhere classified: Secondary | ICD-10-CM | POA: Diagnosis not present

## 2011-11-06 ENCOUNTER — Ambulatory Visit: Payer: Medicare Other | Admitting: Physical Therapy

## 2011-11-06 DIAGNOSIS — M25519 Pain in unspecified shoulder: Secondary | ICD-10-CM | POA: Diagnosis not present

## 2011-11-06 DIAGNOSIS — IMO0001 Reserved for inherently not codable concepts without codable children: Secondary | ICD-10-CM | POA: Diagnosis not present

## 2011-11-06 DIAGNOSIS — R5381 Other malaise: Secondary | ICD-10-CM | POA: Diagnosis not present

## 2011-11-06 DIAGNOSIS — M25619 Stiffness of unspecified shoulder, not elsewhere classified: Secondary | ICD-10-CM | POA: Diagnosis not present

## 2011-11-11 ENCOUNTER — Ambulatory Visit: Payer: Medicare Other | Admitting: Physical Therapy

## 2011-11-11 DIAGNOSIS — H353190 Nonexudative age-related macular degeneration, unspecified eye, stage unspecified: Secondary | ICD-10-CM | POA: Diagnosis not present

## 2011-11-11 DIAGNOSIS — IMO0001 Reserved for inherently not codable concepts without codable children: Secondary | ICD-10-CM | POA: Diagnosis not present

## 2011-11-11 DIAGNOSIS — M25519 Pain in unspecified shoulder: Secondary | ICD-10-CM | POA: Diagnosis not present

## 2011-11-11 DIAGNOSIS — R5381 Other malaise: Secondary | ICD-10-CM | POA: Diagnosis not present

## 2011-11-11 DIAGNOSIS — M25619 Stiffness of unspecified shoulder, not elsewhere classified: Secondary | ICD-10-CM | POA: Diagnosis not present

## 2011-11-13 ENCOUNTER — Ambulatory Visit: Payer: Medicare Other | Admitting: Physical Therapy

## 2011-11-13 DIAGNOSIS — R5381 Other malaise: Secondary | ICD-10-CM | POA: Diagnosis not present

## 2011-11-13 DIAGNOSIS — IMO0001 Reserved for inherently not codable concepts without codable children: Secondary | ICD-10-CM | POA: Diagnosis not present

## 2011-11-13 DIAGNOSIS — M25519 Pain in unspecified shoulder: Secondary | ICD-10-CM | POA: Diagnosis not present

## 2011-11-13 DIAGNOSIS — M25619 Stiffness of unspecified shoulder, not elsewhere classified: Secondary | ICD-10-CM | POA: Diagnosis not present

## 2011-11-27 DIAGNOSIS — Z1211 Encounter for screening for malignant neoplasm of colon: Secondary | ICD-10-CM | POA: Diagnosis not present

## 2011-12-02 DIAGNOSIS — S52123A Displaced fracture of head of unspecified radius, initial encounter for closed fracture: Secondary | ICD-10-CM | POA: Diagnosis not present

## 2011-12-02 DIAGNOSIS — M25539 Pain in unspecified wrist: Secondary | ICD-10-CM | POA: Diagnosis not present

## 2011-12-02 DIAGNOSIS — M19019 Primary osteoarthritis, unspecified shoulder: Secondary | ICD-10-CM | POA: Diagnosis not present

## 2011-12-03 ENCOUNTER — Other Ambulatory Visit: Payer: Self-pay | Admitting: Orthopedic Surgery

## 2011-12-03 DIAGNOSIS — E119 Type 2 diabetes mellitus without complications: Secondary | ICD-10-CM | POA: Diagnosis not present

## 2011-12-03 DIAGNOSIS — H353190 Nonexudative age-related macular degeneration, unspecified eye, stage unspecified: Secondary | ICD-10-CM | POA: Diagnosis not present

## 2011-12-03 DIAGNOSIS — H43819 Vitreous degeneration, unspecified eye: Secondary | ICD-10-CM | POA: Diagnosis not present

## 2011-12-03 DIAGNOSIS — H35379 Puckering of macula, unspecified eye: Secondary | ICD-10-CM | POA: Diagnosis not present

## 2011-12-04 DIAGNOSIS — R1310 Dysphagia, unspecified: Secondary | ICD-10-CM | POA: Diagnosis not present

## 2011-12-04 DIAGNOSIS — K222 Esophageal obstruction: Secondary | ICD-10-CM | POA: Diagnosis not present

## 2011-12-05 ENCOUNTER — Encounter (HOSPITAL_BASED_OUTPATIENT_CLINIC_OR_DEPARTMENT_OTHER): Payer: Self-pay | Admitting: *Deleted

## 2011-12-05 NOTE — Progress Notes (Signed)
Pt here 5/12 for original surg-does not drive in [place].  Saw dr hochrein 2011-had stess test neg- Had endo and esoph dilated last week-did well To come in 2 hr early for ekg and istat with daughter

## 2011-12-08 DIAGNOSIS — M8560 Other cyst of bone, unspecified site: Secondary | ICD-10-CM | POA: Diagnosis not present

## 2011-12-08 DIAGNOSIS — M75110 Incomplete rotator cuff tear or rupture of unspecified shoulder, not specified as traumatic: Secondary | ICD-10-CM | POA: Diagnosis not present

## 2011-12-08 DIAGNOSIS — M67919 Unspecified disorder of synovium and tendon, unspecified shoulder: Secondary | ICD-10-CM | POA: Diagnosis not present

## 2011-12-08 DIAGNOSIS — M75100 Unspecified rotator cuff tear or rupture of unspecified shoulder, not specified as traumatic: Secondary | ICD-10-CM | POA: Diagnosis not present

## 2011-12-08 DIAGNOSIS — S43429A Sprain of unspecified rotator cuff capsule, initial encounter: Secondary | ICD-10-CM | POA: Diagnosis not present

## 2011-12-08 DIAGNOSIS — M719 Bursopathy, unspecified: Secondary | ICD-10-CM | POA: Diagnosis not present

## 2011-12-08 DIAGNOSIS — M19019 Primary osteoarthritis, unspecified shoulder: Secondary | ICD-10-CM | POA: Diagnosis not present

## 2011-12-08 DIAGNOSIS — M24119 Other articular cartilage disorders, unspecified shoulder: Secondary | ICD-10-CM | POA: Diagnosis not present

## 2011-12-08 DIAGNOSIS — S46819A Strain of other muscles, fascia and tendons at shoulder and upper arm level, unspecified arm, initial encounter: Secondary | ICD-10-CM | POA: Diagnosis not present

## 2011-12-08 DIAGNOSIS — M898X9 Other specified disorders of bone, unspecified site: Secondary | ICD-10-CM | POA: Diagnosis not present

## 2011-12-10 ENCOUNTER — Encounter (HOSPITAL_BASED_OUTPATIENT_CLINIC_OR_DEPARTMENT_OTHER): Payer: Self-pay | Admitting: *Deleted

## 2011-12-10 ENCOUNTER — Encounter (HOSPITAL_BASED_OUTPATIENT_CLINIC_OR_DEPARTMENT_OTHER)
Admission: RE | Disposition: A | Discharge status: Home / Self Care | Payer: Self-pay | Source: Ambulatory Visit | Attending: Orthopedic Surgery

## 2011-12-10 ENCOUNTER — Other Ambulatory Visit: Payer: Self-pay

## 2011-12-10 ENCOUNTER — Ambulatory Visit (HOSPITAL_BASED_OUTPATIENT_CLINIC_OR_DEPARTMENT_OTHER): Payer: Medicare Other | Admitting: *Deleted

## 2011-12-10 ENCOUNTER — Ambulatory Visit (HOSPITAL_BASED_OUTPATIENT_CLINIC_OR_DEPARTMENT_OTHER)
Admission: RE | Admit: 2011-12-10 | Discharge: 2011-12-10 | Disposition: A | Discharge status: Home / Self Care | Payer: Medicare Other | Source: Ambulatory Visit | Attending: Orthopedic Surgery | Admitting: Orthopedic Surgery

## 2011-12-10 DIAGNOSIS — S52123A Displaced fracture of head of unspecified radius, initial encounter for closed fracture: Secondary | ICD-10-CM

## 2011-12-10 DIAGNOSIS — K219 Gastro-esophageal reflux disease without esophagitis: Secondary | ICD-10-CM | POA: Insufficient documentation

## 2011-12-10 DIAGNOSIS — I10 Essential (primary) hypertension: Secondary | ICD-10-CM | POA: Diagnosis not present

## 2011-12-10 DIAGNOSIS — M19029 Primary osteoarthritis, unspecified elbow: Secondary | ICD-10-CM | POA: Insufficient documentation

## 2011-12-10 DIAGNOSIS — E785 Hyperlipidemia, unspecified: Secondary | ICD-10-CM | POA: Diagnosis not present

## 2011-12-10 DIAGNOSIS — Z472 Encounter for removal of internal fixation device: Secondary | ICD-10-CM | POA: Diagnosis not present

## 2011-12-10 DIAGNOSIS — G8918 Other acute postprocedural pain: Secondary | ICD-10-CM | POA: Diagnosis not present

## 2011-12-10 DIAGNOSIS — M25529 Pain in unspecified elbow: Secondary | ICD-10-CM | POA: Diagnosis not present

## 2011-12-10 DIAGNOSIS — I2510 Atherosclerotic heart disease of native coronary artery without angina pectoris: Secondary | ICD-10-CM | POA: Insufficient documentation

## 2011-12-10 DIAGNOSIS — T8489XA Other specified complication of internal orthopedic prosthetic devices, implants and grafts, initial encounter: Secondary | ICD-10-CM | POA: Diagnosis not present

## 2011-12-10 DIAGNOSIS — Y831 Surgical operation with implant of artificial internal device as the cause of abnormal reaction of the patient, or of later complication, without mention of misadventure at the time of the procedure: Secondary | ICD-10-CM | POA: Insufficient documentation

## 2011-12-10 HISTORY — PX: HARDWARE REMOVAL: SHX979

## 2011-12-10 LAB — POCT I-STAT, CHEM 8
BUN: 12 mg/dL (ref 6–23)
Calcium, Ion: 1.15 mmol/L (ref 1.12–1.32)
Chloride: 104 mEq/L (ref 96–112)
Creatinine, Ser: 0.8 mg/dL (ref 0.50–1.10)
Glucose, Bld: 121 mg/dL — ABNORMAL HIGH (ref 70–99)
HCT: 37 % (ref 36.0–46.0)
Hemoglobin: 12.6 g/dL (ref 12.0–15.0)
Potassium: 4 mEq/L (ref 3.5–5.1)
Sodium: 139 mEq/L (ref 135–145)
TCO2: 26 mmol/L (ref 0–100)

## 2011-12-10 LAB — GLUCOSE, CAPILLARY: Glucose-Capillary: 108 mg/dL — ABNORMAL HIGH (ref 70–99)

## 2011-12-10 SURGERY — REMOVAL, HARDWARE
Anesthesia: General | Site: Arm Upper | Laterality: Right | Wound class: Clean

## 2011-12-10 MED ORDER — ONDANSETRON HCL 4 MG/2ML IJ SOLN
INTRAMUSCULAR | Status: DC | PRN
  Administered 2011-12-10: 4 mg via INTRAVENOUS

## 2011-12-10 MED ORDER — LACTATED RINGERS IV SOLN
INTRAVENOUS | Status: DC
  Administered 2011-12-10 (×2): via INTRAVENOUS

## 2011-12-10 MED ORDER — OXYCODONE-ACETAMINOPHEN 5-325 MG PO TABS: 1.0000 | ORAL_TABLET | ORAL | Status: AC | PRN

## 2011-12-10 MED ORDER — HYDROMORPHONE HCL PF 1 MG/ML IJ SOLN: 0.2500 mg | INTRAMUSCULAR | Status: DC | PRN

## 2011-12-10 MED ORDER — CHLORHEXIDINE GLUCONATE 4 % EX LIQD: 60.0000 mL | Freq: Once | CUTANEOUS | Status: DC

## 2011-12-10 MED ORDER — CEFAZOLIN SODIUM 1-5 GM-% IV SOLN
1.0000 g | INTRAVENOUS | Status: AC
  Administered 2011-12-10: 1 g via INTRAVENOUS

## 2011-12-10 MED ORDER — ROPIVACAINE HCL 5 MG/ML IJ SOLN
INTRAMUSCULAR | Status: DC | PRN
  Administered 2011-12-10: 30 mL via EPIDURAL

## 2011-12-10 MED ORDER — PROPOFOL 10 MG/ML IV EMUL
INTRAVENOUS | Status: DC | PRN
  Administered 2011-12-10: 100 mg via INTRAVENOUS

## 2011-12-10 MED ORDER — GLYCOPYRROLATE 0.2 MG/ML IJ SOLN
INTRAMUSCULAR | Status: DC | PRN
  Administered 2011-12-10: 0.2 mg via INTRAVENOUS

## 2011-12-10 MED ORDER — FENTANYL CITRATE 0.05 MG/ML IJ SOLN
100.0000 ug | INTRAMUSCULAR | Status: AC | PRN
  Administered 2011-12-10 (×2): 50 ug via INTRAVENOUS

## 2011-12-10 MED ORDER — MIDAZOLAM HCL 2 MG/2ML IJ SOLN: 2.0000 mg | INTRAMUSCULAR | Status: DC | PRN

## 2011-12-10 SURGICAL SUPPLY — 56 items
APL SKNCLS STERI-STRIP NONHPOA (GAUZE/BANDAGES/DRESSINGS) ×1
BANDAGE ELASTIC 3 VELCRO ST LF (GAUZE/BANDAGES/DRESSINGS) ×2 IMPLANT
BANDAGE ELASTIC 4 VELCRO ST LF (GAUZE/BANDAGES/DRESSINGS) ×2 IMPLANT
BANDAGE GAUZE ELAST BULKY 4 IN (GAUZE/BANDAGES/DRESSINGS) ×2 IMPLANT
BENZOIN TINCTURE PRP APPL 2/3 (GAUZE/BANDAGES/DRESSINGS) ×2 IMPLANT
BLADE MINI RND TIP GREEN BEAV (BLADE) IMPLANT
BLADE SURG 15 STRL LF DISP TIS (BLADE) ×1 IMPLANT
BLADE SURG 15 STRL SS (BLADE) ×2
BNDG CMPR 9X4 STRL LF SNTH (GAUZE/BANDAGES/DRESSINGS) ×1
BNDG ESMARK 4X9 LF (GAUZE/BANDAGES/DRESSINGS) ×2 IMPLANT
CHLORAPREP W/TINT 26ML (MISCELLANEOUS) IMPLANT
CLOTH BEACON ORANGE TIMEOUT ST (SAFETY) ×2 IMPLANT
CORDS BIPOLAR (ELECTRODE) ×2 IMPLANT
COVER TABLE BACK 60X90 (DRAPES) ×2 IMPLANT
CUFF TOURNIQUET SINGLE 18IN (TOURNIQUET CUFF) ×2 IMPLANT
DECANTER SPIKE VIAL GLASS SM (MISCELLANEOUS) IMPLANT
DRAPE EXTREMITY T 121X128X90 (DRAPE) ×2 IMPLANT
DRAPE OEC MINIVIEW 54X84 (DRAPES) ×2 IMPLANT
DRAPE SURG 17X23 STRL (DRAPES) ×2 IMPLANT
DURAPREP 26ML APPLICATOR (WOUND CARE) ×2 IMPLANT
GAUZE SPONGE 4X4 16PLY XRAY LF (GAUZE/BANDAGES/DRESSINGS) IMPLANT
GAUZE XEROFORM 1X8 LF (GAUZE/BANDAGES/DRESSINGS) IMPLANT
GLOVE BIO SURGEON STRL SZ 6.5 (GLOVE) ×2 IMPLANT
GLOVE BIO SURGEON STRL SZ8.5 (GLOVE) ×2 IMPLANT
GLOVE BIOGEL PI IND STRL 7.0 (GLOVE) ×1 IMPLANT
GLOVE BIOGEL PI INDICATOR 7.0 (GLOVE) ×1
GLOVE SURG ORTHO 8.0 STRL STRW (GLOVE) ×2 IMPLANT
GOWN PREVENTION PLUS XLARGE (GOWN DISPOSABLE) ×2 IMPLANT
GOWN PREVENTION PLUS XXLARGE (GOWN DISPOSABLE) ×6 IMPLANT
NEEDLE HYPO 25X1 1.5 SAFETY (NEEDLE) IMPLANT
PACK BASIN DAY SURGERY FS (CUSTOM PROCEDURE TRAY) ×2 IMPLANT
PAD CAST 4YDX4 CTTN HI CHSV (CAST SUPPLIES) ×1 IMPLANT
PADDING CAST ABS 4INX4YD NS (CAST SUPPLIES) ×1
PADDING CAST ABS COTTON 4X4 ST (CAST SUPPLIES) ×1 IMPLANT
PADDING CAST COTTON 4X4 STRL (CAST SUPPLIES) ×2
SHEET MEDIUM DRAPE 40X70 STRL (DRAPES) ×2 IMPLANT
SLING ARM FOAM STRAP LRG (SOFTGOODS) ×2 IMPLANT
SPONGE GAUZE 4X4 12PLY (GAUZE/BANDAGES/DRESSINGS) ×2 IMPLANT
STOCKINETTE 4X48 STRL (DRAPES) ×2 IMPLANT
STRIP CLOSURE SKIN 1/2X4 (GAUZE/BANDAGES/DRESSINGS) ×2 IMPLANT
SUT ETHILON 3 0 PS 1 (SUTURE) IMPLANT
SUT ETHILON 5 0 PS 2 18 (SUTURE) IMPLANT
SUT MON AB 4-0 PC3 18 (SUTURE) IMPLANT
SUT PROLENE 3 0 PS 2 (SUTURE) ×2 IMPLANT
SUT VIC AB 0 CT3 27 (SUTURE) IMPLANT
SUT VIC AB 0 SH 27 (SUTURE) ×2 IMPLANT
SUT VIC AB 2-0 SH 27 (SUTURE) ×2
SUT VIC AB 2-0 SH 27XBRD (SUTURE) ×1 IMPLANT
SUT VIC AB 3-0 PS1 18 (SUTURE)
SUT VIC AB 3-0 PS1 18XBRD (SUTURE) IMPLANT
SUT VICRYL 4-0 PS2 18IN ABS (SUTURE) IMPLANT
SUT VICRYL RAPIDE 4/0 PS 2 (SUTURE) IMPLANT
SYR BULB 3OZ (MISCELLANEOUS) ×2 IMPLANT
SYRINGE 10CC LL (SYRINGE) IMPLANT
UNDERPAD 30X30 INCONTINENT (UNDERPADS AND DIAPERS) ×2 IMPLANT
WATER STERILE IRR 1000ML POUR (IV SOLUTION) IMPLANT

## 2011-12-10 NOTE — Anesthesia Postprocedure Evaluation (Signed)
  Anesthesia Post-op Note  Patient: Debra Phillips  Procedure(s) Performed: Procedure(s) (LRB): HARDWARE REMOVAL (Right)  Patient Location: PACU  Anesthesia Type: GA combined with regional for post-op pain  Level of Consciousness: awake and alert   Airway and Oxygen Therapy: Patient Spontanous Breathing  Post-op Pain: none  Post-op Assessment: Post-op Vital signs reviewed, Patient's Cardiovascular Status Stable, Respiratory Function Stable and Patent Airway  Post-op Vital Signs: Reviewed and stable  Complications: No apparent anesthesia complications

## 2011-12-10 NOTE — Progress Notes (Signed)
Assisted Dr. Fitzgerald with right, ultrasound guided, supraclavicular block. Side rails up, monitors on throughout procedure. See vital signs in flow sheet. Tolerated Procedure well. 

## 2011-12-10 NOTE — Transfer of Care (Signed)
Immediate Anesthesia Transfer of Care Note  Patient: Debra Phillips  Procedure(s) Performed: Procedure(s) (LRB): HARDWARE REMOVAL (Right)  Patient Location: PACU  Anesthesia Type: GA combined with regional for post-op pain  Level of Consciousness: sedated  Airway & Oxygen Therapy: Patient Spontanous Breathing and Patient connected to face mask oxygen  Post-op Assessment: Report given to PACU RN and Post -op Vital signs reviewed and stable  Post vital signs: Reviewed and stable  Complications: No apparent anesthesia complications

## 2011-12-10 NOTE — Discharge Instructions (Signed)
°  Post Anesthesia Home Care Instructions ° °Activity: °Get plenty of rest for the remainder of the day. A responsible adult should stay with you for 24 hours following the procedure.  °For the next 24 hours, DO NOT: °-Drive a car °-Operate machinery °-Drink alcoholic beverages °-Take any medication unless instructed by your physician °-Make any legal decisions or sign important papers. ° °Meals: °Start with liquid foods such as gelatin or soup. Progress to regular foods as tolerated. Avoid greasy, spicy, heavy foods. If nausea and/or vomiting occur, drink only clear liquids until the nausea and/or vomiting subsides. Call your physician if vomiting continues. ° °Special Instructions/Symptoms: °Your throat may feel dry or sore from the anesthesia or the breathing tube placed in your throat during surgery. If this causes discomfort, gargle with warm salt water. The discomfort should disappear within 24 hours. ° °Regional Anesthesia Blocks ° °1. Numbness or the inability to move the "blocked" extremity may last from 3-48 hours after placement. The length of time depends on the medication injected and your individual response to the medication. If the numbness is not going away after 48 hours, call your surgeon. ° °2. The extremity that is blocked will need to be protected until the numbness is gone and the  Strength has returned. Because you cannot feel it, you will need to take extra care to avoid injury. Because it may be weak, you may have difficulty moving it or using it. You may not know what position it is in without looking at it while the block is in effect. ° °3. For blocks in the legs and feet, returning to weight bearing and walking needs to be done carefully. You will need to wait until the numbness is entirely gone and the strength has returned. You should be able to move your leg and foot normally before you try and bear weight or walk. You will need someone to be with you when you first try to ensure you  do not fall and possibly risk injury. ° °4. Bruising and tenderness at the needle site are common side effects and will resolve in a few days. ° °5. Persistent numbness or new problems with movement should be communicated to the surgeon or the [facility] ([phone])/ [facility] ([phone]). °

## 2011-12-10 NOTE — Anesthesia Procedure Notes (Addendum)
Anesthesia Regional Block:  Supraclavicular block  Pre-Anesthetic Checklist: ,, timeout performed, Correct Patient, Correct Site, Correct Laterality, Correct Procedure, Correct Position, site marked, Risks and benefits discussed, pre-op evaluation, post-op pain management  Laterality: Right  Prep: Maximum Sterile Barrier Precautions used and chloraprep       Needles:  Injection technique: Single-shot  Needle Type: Echogenic Stimulator Needle          Additional Needles:  Procedures: ultrasound guided and nerve stimulator Supraclavicular block Narrative:  Start time: 12/10/2011 11:23 AM End time: 12/10/2011 11:38 AM Injection made incrementally with aspirations every 5 mL. Anesthesiologist: Fitzgerald,MD  Additional Notes: 2% Lidocaine skin wheel. Intercostobrachial with 8cc of 0.5% Ropivicaine plain.  Supraclavicular block Procedure Name: LMA Insertion Date/Time: 12/10/2011 11:49 AM Performed by: Verlan Friends Pre-anesthesia Checklist: Patient identified, Emergency Drugs available, Suction available and Patient being monitored Patient Re-evaluated:Patient Re-evaluated prior to inductionOxygen Delivery Method: Circle System Utilized Preoxygenation: Pre-oxygenation with 100% oxygen Intubation Type: IV induction Ventilation: Mask ventilation without difficulty LMA: LMA with gastric port inserted LMA Size: 4.0 Number of attempts: 1 Placement Confirmation: positive ETCO2 Tube secured with: Tape Dental Injury: Teeth and Oropharynx as per pre-operative assessment

## 2011-12-10 NOTE — H&P (Signed)
Debra Phillips is an 78 y.o. female.   Chief Complaint: right elbow pain HPI:78 y/o female s/p radial head replacement now with capitellar djd  Past Medical History  Diagnosis Date  . Hypertension   . Esophageal stricture     s/p dilation  . Osteoarthritis   . GERD (gastroesophageal reflux disease)   . Breast cyst     Right  . Dyslipidemia   . Aortic root dilatation     Mild  . Shortness of breath     Past Surgical History  Procedure Date  . Partial hysterectomy   . Total knee arthroplasty     Left  . Shoulder surgery     Right  . Knee arthroscopy 2004    Left and right  . Lumbar fusion 2000,2003  . Radial head arthroplasty 5/12    orif rt radial head  . Back surgery   . Abdominal hysterectomy   . Appendectomy   . Cholecystectomy 1993  . Cardiac catheterization 2010    both cataracts  . Fracture surgery     rt wrist2010  . Kyphosis surgery     Family History  Problem Relation Age of Onset  . Pneumonia Mother   . Brain cancer Father   . Arthritis Sister   . Emphysema Brother   . Lung cancer Brother   . Heart disease Brother   . Liver cancer Brother   . Diabetes Other      1 child has diabetes at age 44 and the other 4 are healthy   Social History:  reports that she has never smoked. She does not have any smokeless tobacco history on file. She reports that she does not drink alcohol or use illicit drugs.  Allergies:  Allergies  Allergen Reactions  . Codeine Nausea And Vomiting  . Sulfonamide Derivatives Hives  . Vancomycin Itching    No current facility-administered medications on file as of 12/10/2011.   Medications Prior to Admission  Medication Sig Dispense Refill  . ascorbic acid (VITAMIN C) 500 MG tablet Take 500 mg by mouth daily.        Marland Kitchen aspirin 81 MG tablet Take 81 mg by mouth daily.        . calcium carbonate (OS-CAL) 600 MG TABS Take 600 mg by mouth daily.        . Cholecalciferol 1000 UNITS tablet Take 2,000 Units by mouth daily.         . Cinnamon 500 MG capsule Take 1,000 mg by mouth daily.        . citalopram (CELEXA) 20 MG tablet Take 20 mg by mouth daily.        Marland Kitchen dexlansoprazole (DEXILANT) 60 MG capsule Take 60 mg by mouth daily.       Marland Kitchen diltiazem (DILACOR XR) 180 MG 24 hr capsule Take 180 mg by mouth daily.        . furosemide (LASIX) 20 MG tablet Take by mouth as needed.       Marland Kitchen lisinopril-hydrochlorothiazide (PRINZIDE,ZESTORETIC) 20-12.5 MG per tablet Take 1 tablet by mouth daily.        . metFORMIN (GLUCOPHAGE) 500 MG tablet Take 500 mg by mouth 2 (two) times daily.        . multivitamin (THERAGRAN) per tablet Take 1 tablet by mouth daily.        . Omega-3 Fatty Acids (FISH OIL) 1000 MG CAPS Take 2,000 mg by mouth daily.        . rosuvastatin (CRESTOR)  5 MG tablet Take 5 mg by mouth daily.        . traMADol (ULTRAM) 50 MG tablet Take 50 mg by mouth as needed.          No results found for this or any previous visit (from the past 48 hour(s)). No results found.  Review of Systems  Constitutional: Negative.   HENT: Negative.   Eyes: Negative.   Respiratory: Negative.   Cardiovascular: Negative.   Gastrointestinal: Negative.   Genitourinary: Negative.   Musculoskeletal: Negative.   Skin: Negative.   Neurological: Negative.   Endo/Heme/Allergies: Negative.   Psychiatric/Behavioral: Negative.     There were no vitals taken for this visit. Physical Exam  Constitutional: She is oriented to person, place, and time. She appears well-developed and well-nourished.  HENT:  Head: Normocephalic and atraumatic.  Cardiovascular: Normal rate.   Respiratory: Effort normal.  Musculoskeletal:       Right elbow: tenderness found. Radial head tenderness noted.  Neurological: She is alert and oriented to person, place, and time.  Skin: Skin is warm.  Psychiatric: She has a normal mood and affect. Her speech is normal and behavior is normal. Thought content normal.     Assessment/Plan 78 y/o female with  radiocapitellar DJD  Plan radial head excision  Debra Phillips A 12/10/2011, 10:48 AM

## 2011-12-10 NOTE — Brief Op Note (Signed)
12/10/2011  12:44 PM  PATIENT:  Debra Phillips  78 y.o. female  PRE-OPERATIVE DIAGNOSIS:  right radial head replacement  POST-OPERATIVE DIAGNOSIS:  right radial head replacement  PROCEDURE:  Procedure(s) (LRB): HARDWARE REMOVAL (Right)  SURGEON:  Surgeon(s) and Role:    * Marlowe Shores, MD - Primary    * Nicki Reaper, MD - Assisting  PHYSICIAN ASSISTANT:   ASSISTANTS: Cindee Salt   ANESTHESIA:   general  EBL:  Total I/O In: 1000 [I.V.:1000] Out: -   BLOOD ADMINISTERED:none  DRAINS: none   LOCAL MEDICATIONS USED:  NONE  SPECIMEN:  No Specimen  DISPOSITION OF SPECIMEN:  N/A  COUNTS:  YES  TOURNIQUET:   Total Tourniquet Time Documented: Upper Arm (Right) - 40 minutes  DICTATION: .Other Dictation: Dictation Number [id number]  PLAN OF CARE: Discharge to home after PACU  PATIENT DISPOSITION:  PACU - hemodynamically stable.   Delay start of Pharmacological VTE agent (>24hrs) due to surgical blood loss or risk of bleeding: not applicable

## 2011-12-10 NOTE — Op Note (Signed)
NAME:  Debra Phillips, Debra Phillips           ACCOUNT NO.:  [id number]  MEDICAL RECORD NO.:  [id number]  LOCATION:  [facility]                         FACILITY:  [facility]  PHYSICIAN:  Artist Pais. Albie Bazin, M.D.DATE OF BIRTH:  April 18, 1929  DATE OF PROCEDURE:  12/10/2011 DATE OF DISCHARGE:                              OPERATIVE REPORT   PREOPERATIVE DIAGNOSIS:  Painful radial capitellar degenerative joint disease, status post radial head replacement, right elbow.  POSTOPERATIVE DIAGNOSIS:  Painful radial capitellar degenerative joint disease, status post radial head replacement, right elbow.  PROCEDURES:  Excision of radial head prosthesis, right elbow.  SURGEON:  Artist Pais. Mina Marble, M.D.  ASSISTANT:  Cindee Salt, M.D.  ANESTHESIA:  Ax block and general.  COMPLICATION:  No complication.  DRAINS:  No drains.  DESCRIPTION OF PROCEDURE:  The patient was taken to the operating suite. After induction of general anesthesia, axillary block analgesia.  The right upper extremity prepped and draped in sterile fashion.  An Esmarch was used to exsanguinate the limb.  Tourniquet was inflated to 250 mmHg. At this point time, heater previous incision.  Skin was incised 5-6 cm. Dissection was carried down to the lateral aspect of the elbow.  The arm was pronated and supinated to identify the radial capitellar joint.  The extensor musculature was split over the radial capitellar joint.  The joint was entered.  The implant was identified.  The head was removed as per protocol followed by removal of the stem, which required removal of some of the proximal radial neck area.  It was then removed without difficulty.  Wound was thoroughly irrigated and then loosely closed in layers of 0 Vicryl for the capsule and extensor mechanism, and 3-0 Prolene subcuticular stitch on the skin.  Steri-Strips, 4x4s, fluffs, and a posterior elbow splint was applied.  The patient tolerated well, went to recovery room in stable  fashion.     Artist Pais Mina Marble, M.D.    MAW/MEDQ  D:  12/10/2011  T:  12/10/2011  Job:  [id number]

## 2011-12-10 NOTE — Op Note (Signed)
See dictated note [phone]

## 2011-12-10 NOTE — Anesthesia Preprocedure Evaluation (Addendum)
Anesthesia Evaluation  Patient identified by MRN, date of birth, ID band Patient awake    Reviewed: Allergy & Precautions, H&P , NPO status , Patient's Chart, lab work & pertinent test results  History of Anesthesia Complications (+) AWARENESS UNDER ANESTHESIA  Airway Mallampati: II TM Distance: >3 FB Neck ROM: Full    Dental No notable dental hx. (+) Edentulous Upper and Edentulous Lower   Pulmonary neg pulmonary ROS,  breath sounds clear to auscultation  Pulmonary exam normal       Cardiovascular hypertension, On Medications + CAD Rhythm:Regular Rate:Normal     Neuro/Psych negative neurological ROS  negative psych ROS   GI/Hepatic Neg liver ROS, GERD-  Medicated and Controlled,  Endo/Other  negative endocrine ROS  Renal/GU negative Renal ROS  negative genitourinary   Musculoskeletal   Abdominal   Peds  Hematology negative hematology ROS (+)   Anesthesia Other Findings   Reproductive/Obstetrics negative OB ROS                           Anesthesia Physical Anesthesia Plan  ASA: III  Anesthesia Plan: General   Post-op Pain Management:    Induction: Intravenous  Airway Management Planned: LMA  Additional Equipment:   Intra-op Plan:   Post-operative Plan: Extubation in OR  Informed Consent: I have reviewed the patients History and Physical, chart, labs and discussed the procedure including the risks, benefits and alternatives for the proposed anesthesia with the patient or authorized representative who has indicated his/her understanding and acceptance.   Dental advisory given  Plan Discussed with: CRNA  Anesthesia Plan Comments:        Anesthesia Quick Evaluation

## 2011-12-11 ENCOUNTER — Encounter (HOSPITAL_BASED_OUTPATIENT_CLINIC_OR_DEPARTMENT_OTHER): Payer: Self-pay | Admitting: Orthopedic Surgery

## 2011-12-15 DIAGNOSIS — S43499A Other sprain of unspecified shoulder joint, initial encounter: Secondary | ICD-10-CM | POA: Diagnosis not present

## 2011-12-15 DIAGNOSIS — M7520 Bicipital tendinitis, unspecified shoulder: Secondary | ICD-10-CM | POA: Diagnosis not present

## 2011-12-15 DIAGNOSIS — S46819A Strain of other muscles, fascia and tendons at shoulder and upper arm level, unspecified arm, initial encounter: Secondary | ICD-10-CM | POA: Diagnosis not present

## 2011-12-15 DIAGNOSIS — S43429A Sprain of unspecified rotator cuff capsule, initial encounter: Secondary | ICD-10-CM | POA: Diagnosis not present

## 2011-12-16 ENCOUNTER — Encounter (HOSPITAL_BASED_OUTPATIENT_CLINIC_OR_DEPARTMENT_OTHER): Payer: Self-pay

## 2011-12-29 NOTE — Op Note (Signed)
NAMESOWMYA, Debra Phillips             ACCOUNT NO.:  [id number]  MEDICAL RECORD NO.:  [id number]  LOCATION:                                 FACILITY:  PHYSICIAN:  Artist Pais. Kandee Escalante, M.D.DATE OF BIRTH:  [birth year]  DATE OF PROCEDURE:  12/10/2011 DATE OF DISCHARGE:                              OPERATIVE REPORT   PREOPERATIVE DIAGNOSIS:  Painful radial capitellar degenerative joint disease, status post radial head replacement, right elbow.  POSTOPERATIVE DIAGNOSIS:  Painful radial capitellar degenerative joint disease, status post radial head replacement, right elbow.  PROCEDURES:  Excision of radial head prosthesis, right elbow.  SURGEON:  Artist Pais. Mina Marble, M.D.  ASSISTANT:  Cindee Salt, M.D.  ANESTHESIA:  Ax block and general.  COMPLICATION:  No complication.  DRAINS:  No drains.  DESCRIPTION OF PROCEDURE:  The patient was taken to the operating suite. After induction of general anesthesia, axillary block analgesia.  The right upper extremity prepped and draped in sterile fashion.  An Esmarch was used to exsanguinate the limb.  Tourniquet was inflated to 250 mmHg. At this point time, heater previous incision.  Skin was incised 5-6 cm. Dissection was carried down to the lateral aspect of the elbow.  The arm was pronated and supinated to identify the radial capitellar joint.  The extensor musculature was split over the radial capitellar joint.  The joint was entered.  The implant was identified.  The head was removed as per protocol followed by removal of the stem, which required removal of some of the proximal radial neck area.  It was then removed without difficulty.  Wound was thoroughly irrigated and then loosely closed in layers of 0 Vicryl for the capsule and extensor mechanism, and 3-0 Prolene subcuticular stitch on the skin.  Steri-Strips, 4x4s, fluffs, and a posterior elbow splint was applied.  The patient tolerated well, went to recovery room in stable  fashion.     Artist Pais Mina Marble, M.D.    MAW/MEDQ  D:  12/10/2011  T:  12/29/2011  Job:  [id number]

## 2011-12-31 DIAGNOSIS — M1990 Unspecified osteoarthritis, unspecified site: Secondary | ICD-10-CM | POA: Diagnosis not present

## 2011-12-31 DIAGNOSIS — M48061 Spinal stenosis, lumbar region without neurogenic claudication: Secondary | ICD-10-CM | POA: Diagnosis not present

## 2011-12-31 DIAGNOSIS — IMO0002 Reserved for concepts with insufficient information to code with codable children: Secondary | ICD-10-CM | POA: Diagnosis not present

## 2011-12-31 DIAGNOSIS — M47817 Spondylosis without myelopathy or radiculopathy, lumbosacral region: Secondary | ICD-10-CM | POA: Diagnosis not present

## 2012-01-01 DIAGNOSIS — H35379 Puckering of macula, unspecified eye: Secondary | ICD-10-CM | POA: Diagnosis not present

## 2012-01-02 DIAGNOSIS — H35379 Puckering of macula, unspecified eye: Secondary | ICD-10-CM | POA: Diagnosis not present

## 2012-01-13 DIAGNOSIS — I10 Essential (primary) hypertension: Secondary | ICD-10-CM | POA: Diagnosis not present

## 2012-01-13 DIAGNOSIS — I499 Cardiac arrhythmia, unspecified: Secondary | ICD-10-CM | POA: Diagnosis not present

## 2012-01-13 DIAGNOSIS — R5381 Other malaise: Secondary | ICD-10-CM | POA: Diagnosis not present

## 2012-01-13 DIAGNOSIS — E559 Vitamin D deficiency, unspecified: Secondary | ICD-10-CM | POA: Diagnosis not present

## 2012-01-13 DIAGNOSIS — E785 Hyperlipidemia, unspecified: Secondary | ICD-10-CM | POA: Diagnosis not present

## 2012-01-13 DIAGNOSIS — E119 Type 2 diabetes mellitus without complications: Secondary | ICD-10-CM | POA: Diagnosis not present

## 2012-01-14 DIAGNOSIS — S43429A Sprain of unspecified rotator cuff capsule, initial encounter: Secondary | ICD-10-CM | POA: Diagnosis not present

## 2012-01-14 DIAGNOSIS — M19019 Primary osteoarthritis, unspecified shoulder: Secondary | ICD-10-CM | POA: Diagnosis not present

## 2012-01-14 DIAGNOSIS — M7520 Bicipital tendinitis, unspecified shoulder: Secondary | ICD-10-CM | POA: Diagnosis not present

## 2012-01-21 DIAGNOSIS — K5900 Constipation, unspecified: Secondary | ICD-10-CM | POA: Diagnosis not present

## 2012-01-21 DIAGNOSIS — K219 Gastro-esophageal reflux disease without esophagitis: Secondary | ICD-10-CM | POA: Diagnosis not present

## 2012-01-27 DIAGNOSIS — H35379 Puckering of macula, unspecified eye: Secondary | ICD-10-CM | POA: Diagnosis not present

## 2012-02-11 DIAGNOSIS — T148 Other injury of unspecified body region: Secondary | ICD-10-CM | POA: Diagnosis not present

## 2012-02-11 DIAGNOSIS — W57XXXA Bitten or stung by nonvenomous insect and other nonvenomous arthropods, initial encounter: Secondary | ICD-10-CM | POA: Diagnosis not present

## 2012-02-16 ENCOUNTER — Ambulatory Visit (INDEPENDENT_AMBULATORY_CARE_PROVIDER_SITE_OTHER): Payer: Medicare Other | Admitting: Cardiology

## 2012-02-16 ENCOUNTER — Encounter: Payer: Self-pay | Admitting: Cardiology

## 2012-02-16 ENCOUNTER — Telehealth: Payer: Self-pay | Admitting: Cardiology

## 2012-02-16 VITALS — BP 116/69 | HR 71 | Ht 62.0 in | Wt 177.0 lb

## 2012-02-16 DIAGNOSIS — R0602 Shortness of breath: Secondary | ICD-10-CM

## 2012-02-16 DIAGNOSIS — I10 Essential (primary) hypertension: Secondary | ICD-10-CM | POA: Diagnosis not present

## 2012-02-16 DIAGNOSIS — I719 Aortic aneurysm of unspecified site, without rupture: Secondary | ICD-10-CM

## 2012-02-16 DIAGNOSIS — R072 Precordial pain: Secondary | ICD-10-CM | POA: Diagnosis not present

## 2012-02-16 LAB — BRAIN NATRIURETIC PEPTIDE: Pro B Natriuretic peptide (BNP): 62 pg/mL (ref 0.0–100.0)

## 2012-02-16 NOTE — Assessment & Plan Note (Signed)
Given the patient's chest discomfort he would need stress testing prior to surgery. However, she wouldn't be a walk on a treadmill so she will need a [employer].

## 2012-02-16 NOTE — Progress Notes (Signed)
HPI The patient presents for followup. I saw her last in July 2011. She had a stress perfusion study then prior to having elbow surgery. She's had another surgery since then and now is scheduled apparently or is thinking about having left shoulder surgery. Her stress perfusion study in 2011 demonstrated a well a well preserved ejection fraction and no ischemia. Previous ejection fraction on echo was 45% with some aortic root dilatation. Since I last saw her in over the past few months she's had increasing dyspnea with mild exertion such as walking 25 yards on level ground. Her family notices this. She is limited by back pain in joint pains but still tries to be active. She does get some chest fullness at times with activities as well. She has chronic neck and arm pain. She doesn't describe any associated nausea vomiting or diaphoresis. She doesn't have any palpitations, presyncope or syncope. She doesn't report PND or orthopnea. She's had only very mild lower extremity edema.  Allergies  Allergen Reactions  . Codeine Nausea And Vomiting  . Sulfonamide Derivatives Hives  . Vancomycin Itching    Current Outpatient Prescriptions  Medication Sig Dispense Refill  . ascorbic acid (VITAMIN C) 500 MG tablet Take 500 mg by mouth daily.       Marland Kitchen aspirin 81 MG tablet Take 81 mg by mouth daily.        . calcium carbonate (OS-CAL) 600 MG TABS Take 600 mg by mouth daily.        . Cholecalciferol 1000 UNITS tablet Take 2,000 Units by mouth daily.        . Cinnamon 500 MG capsule Take 1,000 mg by mouth daily.        . citalopram (CELEXA) 20 MG tablet Take 20 mg by mouth daily.        Marland Kitchen dexlansoprazole (DEXILANT) 60 MG capsule Take 60 mg by mouth daily.       Marland Kitchen diltiazem (DILACOR XR) 180 MG 24 hr capsule Take 180 mg by mouth daily.        Marland Kitchen lisinopril-hydrochlorothiazide (PRINZIDE,ZESTORETIC) 20-12.5 MG per tablet Take 1 tablet by mouth daily.        . metFORMIN (GLUCOPHAGE) 500 MG tablet Take 500 mg by mouth  2 (two) times daily.        . multivitamin (THERAGRAN) per tablet Take 1 tablet by mouth daily.        . Omega-3 Fatty Acids (FISH OIL) 1000 MG CAPS Take 2,000 mg by mouth daily.        . rosuvastatin (CRESTOR) 5 MG tablet Take 5 mg by mouth daily.        . traMADol (ULTRAM) 50 MG tablet Take 50 mg by mouth as needed.          Past Medical History  Diagnosis Date  . Hypertension   . Esophageal stricture     s/p dilation  . Osteoarthritis   . GERD (gastroesophageal reflux disease)   . Breast cyst     Right  . Dyslipidemia   . Aortic root dilatation     Mild  . Shortness of breath     Past Surgical History  Procedure Date  . Partial hysterectomy   . Total knee arthroplasty     Left  . Shoulder surgery     Right  . Knee arthroscopy 2004    Left and right  . Lumbar fusion 2000,2003  . Radial head arthroplasty 5/12    orif rt radial head  .  Back surgery   . Abdominal hysterectomy   . Appendectomy   . Cholecystectomy 1993  . Cardiac catheterization 2010    both cataracts  . Fracture surgery     rt wrist2010  . Kyphosis surgery   . Hardware removal 12/10/2011    Procedure: HARDWARE REMOVAL;  Surgeon: Marlowe Shores, MD;  Location: [facility];  Service: Orthopedics;  Laterality: Right;  radial head    ROS:  As stated in the HPI and negative for all other systems.  PHYSICAL EXAM BP 116/69  Pulse 71  Ht 5\' 2"  (1.575 m)  Wt 177 lb (80.287 kg)  BMI 32.37 kg/m2 GENERAL:  Well appearing HEENT:  Pupils equal round and reactive, fundi not visualized, oral mucosa unremarkable, dentures NECK:  No jugular venous distention, waveform within normal limits, carotid upstroke brisk and symmetric, no bruits, no thyromegaly LYMPHATICS:  No cervical, inguinal adenopathy LUNGS:  Clear to auscultation bilaterally BACK:  No CVA tenderness CHEST:  Unremarkable HEART:  PMI not displaced or sustained,S1 and S2 within normal limits, no S3, no S4, no clicks, no rubs, no  murmurs ABD:  Flat, positive bowel sounds normal in frequency in pitch, no bruits, no rebound, no guarding, no midline pulsatile mass, no hepatomegaly, no splenomegaly EXT:  2 plus pulses throughout, no edema, no cyanosis no clubbing SKIN:  No rashes no nodules NEURO:  Cranial nerves II through XII grossly intact, motor grossly intact throughout [facility]:  Cognitively intact, oriented to person place and time  EKG:  12/10/11  normal sinus rhythm, rate 63, left axis deviation, intervals within normal limits, no acute ST-T wave changes.  ASSESSMENT AND PLAN

## 2012-02-16 NOTE — Assessment & Plan Note (Signed)
Her blood pressures well controlled today though she reports it somewhat labile at other times. She will continue the medications as listed.

## 2012-02-16 NOTE — Assessment & Plan Note (Signed)
This will be followed with an echocardiogram.

## 2012-02-16 NOTE — Patient Instructions (Addendum)
Your physician has requested that you have an echocardiogram. Echocardiography is a painless test that uses sound waves to create images of your heart. It provides your doctor with information about the size and shape of your heart and how well your heart's chambers and valves are working. This procedure takes approximately one hour. There are no restrictions for this procedure.  Your physician has requested that you have a lexiscan myoview. For further information please visit [email or web address] Please follow instruction sheet, as given.  Lab today:  BNP

## 2012-03-02 DIAGNOSIS — N952 Postmenopausal atrophic vaginitis: Secondary | ICD-10-CM | POA: Diagnosis not present

## 2012-03-02 DIAGNOSIS — N390 Urinary tract infection, site not specified: Secondary | ICD-10-CM | POA: Diagnosis not present

## 2012-03-02 DIAGNOSIS — N3941 Urge incontinence: Secondary | ICD-10-CM | POA: Diagnosis not present

## 2012-03-02 DIAGNOSIS — N3000 Acute cystitis without hematuria: Secondary | ICD-10-CM | POA: Diagnosis not present

## 2012-03-04 ENCOUNTER — Ambulatory Visit (HOSPITAL_COMMUNITY): Payer: Medicare Other | Attending: Internal Medicine | Admitting: Radiology

## 2012-03-04 ENCOUNTER — Ambulatory Visit (HOSPITAL_COMMUNITY): Payer: Medicare Other | Attending: Internal Medicine

## 2012-03-04 VITALS — BP 105/68 | Ht 62.0 in | Wt 172.0 lb

## 2012-03-04 DIAGNOSIS — E785 Hyperlipidemia, unspecified: Secondary | ICD-10-CM | POA: Insufficient documentation

## 2012-03-04 DIAGNOSIS — R0602 Shortness of breath: Secondary | ICD-10-CM | POA: Diagnosis not present

## 2012-03-04 DIAGNOSIS — R0609 Other forms of dyspnea: Secondary | ICD-10-CM | POA: Diagnosis not present

## 2012-03-04 DIAGNOSIS — R079 Chest pain, unspecified: Secondary | ICD-10-CM | POA: Diagnosis not present

## 2012-03-04 DIAGNOSIS — I10 Essential (primary) hypertension: Secondary | ICD-10-CM | POA: Diagnosis not present

## 2012-03-04 DIAGNOSIS — R609 Edema, unspecified: Secondary | ICD-10-CM | POA: Insufficient documentation

## 2012-03-04 DIAGNOSIS — R0989 Other specified symptoms and signs involving the circulatory and respiratory systems: Secondary | ICD-10-CM | POA: Insufficient documentation

## 2012-03-04 MED ORDER — TECHNETIUM TC 99M TETROFOSMIN IV KIT
33.0000 | PACK | Freq: Once | INTRAVENOUS | Status: AC | PRN
  Administered 2012-03-04: 33 via INTRAVENOUS

## 2012-03-04 MED ORDER — TECHNETIUM TC 99M TETROFOSMIN IV KIT
11.0000 | PACK | Freq: Once | INTRAVENOUS | Status: AC | PRN
  Administered 2012-03-04: 11 via INTRAVENOUS

## 2012-03-04 MED ORDER — REGADENOSON 0.4 MG/5ML IV SOLN
0.4000 mg | Freq: Once | INTRAVENOUS | Status: AC
  Administered 2012-03-04: 0.4 mg via INTRAVENOUS

## 2012-03-04 NOTE — Progress Notes (Signed)
Echocardiogram performed.  

## 2012-03-04 NOTE — Progress Notes (Signed)
[facility] SITE 3 NUCLEAR MED [address] [place] [address] [phone]  Cardiology Nuclear Med Study  Debra Phillips is a 78 y.o. female     MRN : [id number]     DOB: 07-06-1929  Procedure Date: 03/04/2012  Nuclear Med Background Indication for Stress Test:  Evaluation for Ischemia and Pending Surgical Clearance: Left shoulder surgery History:  6/11 ECHO: EF: 45-50% 7/11 MPS: EF: 74% (-) ischemia A/S aneurysm, per echo 5/11,   Cardiac Risk Factors: Hypertension and Lipids  Symptoms:  Chest Pain   Nuclear Pre-Procedure Caffeine/Decaff Intake:  None NPO After: 7:00am   Lungs:  clear O2 Sat: 98% on room air. IV 0.9% NS with Angio Cath:  22g  IV Site: R Antecubital  IV Started by:  Stanton Kidney, EMT-P  Chest Size (in):  40 Cup Size: B  Height: 5\' 2"  (1.575 m)  Weight:  172 lb (78.019 kg)  BMI:  Body mass index is 31.46 kg/(m^2). Tech Comments:  NA    Nuclear Med Study 1 or 2 day study: 1 day  Stress Test Type:  Eugenie Birks  Reading MD: Dietrich Pates, MD  Order Authorizing Provider:  J.Hochrein  Resting Radionuclide: Technetium 55m Tetrofosmin  Resting Radionuclide Dose: 11.0 mCi   Stress Radionuclide:  Technetium 47m Tetrofosmin  Stress Radionuclide Dose: 33.0 mCi           Stress Protocol Rest HR: 62 Stress HR: 90  Rest BP: 105/68 Stress BP: 118  Exercise Time (min): n/a METS: n/a   Predicted Max HR: 139 bpm % Max HR: 64.75 bpm Rate Pressure Product: 46962   Dose of Adenosine (mg):  n/a Dose of Lexiscan: 0.4 mg  Dose of Atropine (mg): n/a Dose of Dobutamine: n/a mcg/kg/min (at max HR)  Stress Test Technologist: Milana Na, EMT-P  Nuclear Technologist:  Domenic Polite, CNMT     Rest Procedure:  Myocardial perfusion imaging was performed at rest 45 minutes following the intravenous administration of Technetium 14m Tetrofosmin. Rest ECG: NSR - Normal EKG  Stress Procedure:  The patient received IV Lexiscan 0.4 mg over 15-seconds.   Technetium 31m Tetrofosmin injected at 30-seconds.  There were no significant changes, sob, and feels faint with Lexiscan.  Quantitative spect images were obtained after a 45 minute delay. Stress ECG: No significant change from baseline ECG  QPS Raw Data Images:  Soft tissue (diaphragm, bowel activity) underlies the inferior wall. Stress Images: Normal perfusion. Rest Images:  Normal homogeneous uptake in all areas of the myocardium. Subtraction (SDS):  No evidence of ischemia. Transient Ischemic Dilatation (Normal <1.22):  *0.97** Lung/Heart Ratio (Normal <0.45):  0.24  Quantitative Gated Spect Images QGS EDV:  70 ml QGS ESV:  18 ml  Impression Exercise Capacity:  Lexiscan with no exercise. BP Response:  Normal blood pressure response. Clinical Symptoms:  No significant symptoms noted. ECG Impression:  No significant ST segment change suggestive of ischemia. Comparison with Prior Nuclear Study: No significant change from previous study  Overall Impression:  Normal stress nuclear study.  LV Ejection Fraction: 74%.  LV Wall Motion:  NL LV Function; NL Wall Motion

## 2012-03-22 ENCOUNTER — Telehealth: Payer: Self-pay | Admitting: Cardiology

## 2012-03-22 ENCOUNTER — Encounter: Payer: Self-pay | Admitting: *Deleted

## 2012-03-22 NOTE — Telephone Encounter (Signed)
Pt needs letter of clearance written for her surgery. She was told everything was ok but she still has no letter

## 2012-03-22 NOTE — Telephone Encounter (Signed)
Letter mailed to pt.  

## 2012-03-29 DIAGNOSIS — N390 Urinary tract infection, site not specified: Secondary | ICD-10-CM | POA: Diagnosis not present

## 2012-03-31 DIAGNOSIS — H3581 Retinal edema: Secondary | ICD-10-CM | POA: Diagnosis not present

## 2012-03-31 DIAGNOSIS — H35379 Puckering of macula, unspecified eye: Secondary | ICD-10-CM | POA: Diagnosis not present

## 2012-04-02 ENCOUNTER — Other Ambulatory Visit: Payer: Self-pay | Admitting: *Deleted

## 2012-04-02 DIAGNOSIS — I714 Abdominal aortic aneurysm, without rupture: Secondary | ICD-10-CM

## 2012-04-08 DIAGNOSIS — I714 Abdominal aortic aneurysm, without rupture: Secondary | ICD-10-CM | POA: Diagnosis not present

## 2012-04-08 DIAGNOSIS — I77819 Aortic ectasia, unspecified site: Secondary | ICD-10-CM | POA: Diagnosis not present

## 2012-04-15 DIAGNOSIS — S43429A Sprain of unspecified rotator cuff capsule, initial encounter: Secondary | ICD-10-CM | POA: Diagnosis not present

## 2012-04-15 DIAGNOSIS — S43499A Other sprain of unspecified shoulder joint, initial encounter: Secondary | ICD-10-CM | POA: Diagnosis not present

## 2012-04-15 DIAGNOSIS — M7520 Bicipital tendinitis, unspecified shoulder: Secondary | ICD-10-CM | POA: Diagnosis not present

## 2012-04-15 DIAGNOSIS — S46819A Strain of other muscles, fascia and tendons at shoulder and upper arm level, unspecified arm, initial encounter: Secondary | ICD-10-CM | POA: Diagnosis not present

## 2012-04-19 ENCOUNTER — Telehealth: Payer: Self-pay

## 2012-04-19 NOTE — Telephone Encounter (Signed)
Message copied by Yolonda Kida on Mon Apr 19, 2012 11:55 AM ------      Message from: Eustace Moore      Created: Tue Apr 13, 2012  4:33 PM                   ----- Message -----         From: Rollene Rotunda, MD         Sent: 04/11/2012   9:36 PM           To: Eustace Moore, LPN            No abdominal aortic anuerysm.  Call Ms. Sippel with the results.

## 2012-04-19 NOTE — Telephone Encounter (Signed)
Patient aware of results of abd test

## 2012-04-22 ENCOUNTER — Encounter: Payer: Self-pay | Admitting: Cardiology

## 2012-04-22 DIAGNOSIS — E559 Vitamin D deficiency, unspecified: Secondary | ICD-10-CM | POA: Diagnosis not present

## 2012-04-22 DIAGNOSIS — E785 Hyperlipidemia, unspecified: Secondary | ICD-10-CM | POA: Diagnosis not present

## 2012-04-22 DIAGNOSIS — N390 Urinary tract infection, site not specified: Secondary | ICD-10-CM | POA: Diagnosis not present

## 2012-04-22 DIAGNOSIS — I10 Essential (primary) hypertension: Secondary | ICD-10-CM | POA: Diagnosis not present

## 2012-04-27 DIAGNOSIS — J069 Acute upper respiratory infection, unspecified: Secondary | ICD-10-CM | POA: Diagnosis not present

## 2012-05-20 DIAGNOSIS — S43499A Other sprain of unspecified shoulder joint, initial encounter: Secondary | ICD-10-CM | POA: Diagnosis not present

## 2012-05-20 DIAGNOSIS — I519 Heart disease, unspecified: Secondary | ICD-10-CM | POA: Diagnosis not present

## 2012-05-20 DIAGNOSIS — Z885 Allergy status to narcotic agent status: Secondary | ICD-10-CM | POA: Diagnosis not present

## 2012-05-20 DIAGNOSIS — S43429A Sprain of unspecified rotator cuff capsule, initial encounter: Secondary | ICD-10-CM | POA: Diagnosis not present

## 2012-05-20 DIAGNOSIS — J449 Chronic obstructive pulmonary disease, unspecified: Secondary | ICD-10-CM | POA: Diagnosis not present

## 2012-05-20 DIAGNOSIS — M810 Age-related osteoporosis without current pathological fracture: Secondary | ICD-10-CM | POA: Diagnosis not present

## 2012-05-20 DIAGNOSIS — I10 Essential (primary) hypertension: Secondary | ICD-10-CM | POA: Diagnosis not present

## 2012-05-20 DIAGNOSIS — G8929 Other chronic pain: Secondary | ICD-10-CM | POA: Diagnosis not present

## 2012-05-20 DIAGNOSIS — Z882 Allergy status to sulfonamides status: Secondary | ICD-10-CM | POA: Diagnosis not present

## 2012-05-20 DIAGNOSIS — K219 Gastro-esophageal reflux disease without esophagitis: Secondary | ICD-10-CM | POA: Diagnosis not present

## 2012-05-20 DIAGNOSIS — F329 Major depressive disorder, single episode, unspecified: Secondary | ICD-10-CM | POA: Diagnosis not present

## 2012-05-20 DIAGNOSIS — Z883 Allergy status to other anti-infective agents status: Secondary | ICD-10-CM | POA: Diagnosis not present

## 2012-05-20 DIAGNOSIS — E119 Type 2 diabetes mellitus without complications: Secondary | ICD-10-CM | POA: Diagnosis not present

## 2012-05-21 DIAGNOSIS — M7520 Bicipital tendinitis, unspecified shoulder: Secondary | ICD-10-CM | POA: Diagnosis not present

## 2012-05-21 DIAGNOSIS — I519 Heart disease, unspecified: Secondary | ICD-10-CM | POA: Diagnosis not present

## 2012-05-21 DIAGNOSIS — S43499A Other sprain of unspecified shoulder joint, initial encounter: Secondary | ICD-10-CM | POA: Diagnosis not present

## 2012-05-21 DIAGNOSIS — J449 Chronic obstructive pulmonary disease, unspecified: Secondary | ICD-10-CM | POA: Diagnosis not present

## 2012-05-21 DIAGNOSIS — K219 Gastro-esophageal reflux disease without esophagitis: Secondary | ICD-10-CM | POA: Diagnosis not present

## 2012-05-21 DIAGNOSIS — M19019 Primary osteoarthritis, unspecified shoulder: Secondary | ICD-10-CM | POA: Diagnosis not present

## 2012-05-21 DIAGNOSIS — G8929 Other chronic pain: Secondary | ICD-10-CM | POA: Diagnosis not present

## 2012-05-21 DIAGNOSIS — G8918 Other acute postprocedural pain: Secondary | ICD-10-CM | POA: Diagnosis not present

## 2012-05-21 DIAGNOSIS — S43429A Sprain of unspecified rotator cuff capsule, initial encounter: Secondary | ICD-10-CM | POA: Diagnosis not present

## 2012-05-21 DIAGNOSIS — M25819 Other specified joint disorders, unspecified shoulder: Secondary | ICD-10-CM | POA: Diagnosis not present

## 2012-05-21 DIAGNOSIS — M810 Age-related osteoporosis without current pathological fracture: Secondary | ICD-10-CM | POA: Diagnosis not present

## 2012-05-31 DIAGNOSIS — S43429A Sprain of unspecified rotator cuff capsule, initial encounter: Secondary | ICD-10-CM | POA: Diagnosis not present

## 2012-06-04 DIAGNOSIS — Z23 Encounter for immunization: Secondary | ICD-10-CM | POA: Diagnosis not present

## 2012-06-14 ENCOUNTER — Ambulatory Visit: Payer: Medicare Other | Attending: Orthopedic Surgery | Admitting: Physical Therapy

## 2012-06-14 DIAGNOSIS — R5381 Other malaise: Secondary | ICD-10-CM | POA: Diagnosis not present

## 2012-06-14 DIAGNOSIS — M25519 Pain in unspecified shoulder: Secondary | ICD-10-CM | POA: Insufficient documentation

## 2012-06-14 DIAGNOSIS — IMO0001 Reserved for inherently not codable concepts without codable children: Secondary | ICD-10-CM | POA: Diagnosis not present

## 2012-06-14 DIAGNOSIS — M25619 Stiffness of unspecified shoulder, not elsewhere classified: Secondary | ICD-10-CM | POA: Diagnosis not present

## 2012-06-15 ENCOUNTER — Ambulatory Visit: Payer: Medicare Other | Admitting: Physical Therapy

## 2012-06-16 DIAGNOSIS — H35379 Puckering of macula, unspecified eye: Secondary | ICD-10-CM | POA: Diagnosis not present

## 2012-06-16 DIAGNOSIS — E119 Type 2 diabetes mellitus without complications: Secondary | ICD-10-CM | POA: Diagnosis not present

## 2012-06-16 DIAGNOSIS — H524 Presbyopia: Secondary | ICD-10-CM | POA: Diagnosis not present

## 2012-06-16 DIAGNOSIS — H353190 Nonexudative age-related macular degeneration, unspecified eye, stage unspecified: Secondary | ICD-10-CM | POA: Diagnosis not present

## 2012-06-18 ENCOUNTER — Ambulatory Visit: Payer: Medicare Other | Admitting: Physical Therapy

## 2012-06-21 ENCOUNTER — Ambulatory Visit: Payer: Medicare Other | Admitting: *Deleted

## 2012-06-23 ENCOUNTER — Ambulatory Visit: Payer: Medicare Other | Admitting: Physical Therapy

## 2012-06-24 ENCOUNTER — Ambulatory Visit: Payer: Medicare Other | Admitting: Physical Therapy

## 2012-06-28 ENCOUNTER — Ambulatory Visit: Payer: Medicare Other | Attending: Orthopedic Surgery | Admitting: Physical Therapy

## 2012-06-28 DIAGNOSIS — M25519 Pain in unspecified shoulder: Secondary | ICD-10-CM | POA: Diagnosis not present

## 2012-06-28 DIAGNOSIS — R5381 Other malaise: Secondary | ICD-10-CM | POA: Insufficient documentation

## 2012-06-28 DIAGNOSIS — IMO0001 Reserved for inherently not codable concepts without codable children: Secondary | ICD-10-CM | POA: Insufficient documentation

## 2012-06-28 DIAGNOSIS — M25619 Stiffness of unspecified shoulder, not elsewhere classified: Secondary | ICD-10-CM | POA: Diagnosis not present

## 2012-06-30 ENCOUNTER — Ambulatory Visit: Payer: Medicare Other | Admitting: Physical Therapy

## 2012-07-01 ENCOUNTER — Ambulatory Visit: Payer: Medicare Other | Admitting: Physical Therapy

## 2012-07-06 ENCOUNTER — Ambulatory Visit: Payer: Medicare Other | Admitting: Physical Therapy

## 2012-07-07 ENCOUNTER — Ambulatory Visit: Payer: Medicare Other | Admitting: *Deleted

## 2012-07-09 ENCOUNTER — Ambulatory Visit: Payer: Medicare Other | Admitting: Physical Therapy

## 2012-07-12 ENCOUNTER — Ambulatory Visit: Payer: Medicare Other | Admitting: Physical Therapy

## 2012-07-14 ENCOUNTER — Ambulatory Visit: Payer: Medicare Other | Admitting: Physical Therapy

## 2012-07-15 ENCOUNTER — Ambulatory Visit: Payer: Medicare Other | Admitting: Physical Therapy

## 2012-07-19 ENCOUNTER — Ambulatory Visit: Payer: Medicare Other | Admitting: Physical Therapy

## 2012-07-20 ENCOUNTER — Encounter: Payer: Medicare Other | Admitting: Physical Therapy

## 2012-07-21 ENCOUNTER — Encounter: Payer: Medicare Other | Admitting: Physical Therapy

## 2012-07-26 ENCOUNTER — Ambulatory Visit: Payer: Medicare Other | Attending: Orthopedic Surgery | Admitting: *Deleted

## 2012-07-26 DIAGNOSIS — E785 Hyperlipidemia, unspecified: Secondary | ICD-10-CM | POA: Diagnosis not present

## 2012-07-26 DIAGNOSIS — R5381 Other malaise: Secondary | ICD-10-CM | POA: Insufficient documentation

## 2012-07-26 DIAGNOSIS — E559 Vitamin D deficiency, unspecified: Secondary | ICD-10-CM | POA: Diagnosis not present

## 2012-07-26 DIAGNOSIS — K219 Gastro-esophageal reflux disease without esophagitis: Secondary | ICD-10-CM | POA: Diagnosis not present

## 2012-07-26 DIAGNOSIS — IMO0001 Reserved for inherently not codable concepts without codable children: Secondary | ICD-10-CM | POA: Diagnosis not present

## 2012-07-26 DIAGNOSIS — M25619 Stiffness of unspecified shoulder, not elsewhere classified: Secondary | ICD-10-CM | POA: Diagnosis not present

## 2012-07-26 DIAGNOSIS — E119 Type 2 diabetes mellitus without complications: Secondary | ICD-10-CM | POA: Diagnosis not present

## 2012-07-26 DIAGNOSIS — M25519 Pain in unspecified shoulder: Secondary | ICD-10-CM | POA: Insufficient documentation

## 2012-07-26 DIAGNOSIS — I10 Essential (primary) hypertension: Secondary | ICD-10-CM | POA: Diagnosis not present

## 2012-07-27 ENCOUNTER — Ambulatory Visit: Payer: Medicare Other | Admitting: Physical Therapy

## 2012-07-29 ENCOUNTER — Ambulatory Visit: Payer: Medicare Other | Admitting: Physical Therapy

## 2012-08-02 ENCOUNTER — Ambulatory Visit: Payer: Medicare Other | Admitting: *Deleted

## 2012-08-03 ENCOUNTER — Other Ambulatory Visit: Payer: Medicare Other

## 2012-08-03 ENCOUNTER — Ambulatory Visit: Payer: Medicare Other | Admitting: Cardiology

## 2012-08-03 DIAGNOSIS — I10 Essential (primary) hypertension: Secondary | ICD-10-CM | POA: Diagnosis not present

## 2012-08-03 DIAGNOSIS — Z124 Encounter for screening for malignant neoplasm of cervix: Secondary | ICD-10-CM | POA: Diagnosis not present

## 2012-08-05 ENCOUNTER — Ambulatory Visit: Payer: Medicare Other | Admitting: Physical Therapy

## 2012-08-09 DIAGNOSIS — Z9889 Other specified postprocedural states: Secondary | ICD-10-CM | POA: Diagnosis not present

## 2012-08-09 DIAGNOSIS — S43429A Sprain of unspecified rotator cuff capsule, initial encounter: Secondary | ICD-10-CM | POA: Diagnosis not present

## 2012-08-10 ENCOUNTER — Ambulatory Visit: Payer: Medicare Other | Admitting: Physical Therapy

## 2012-08-12 ENCOUNTER — Ambulatory Visit: Payer: Medicare Other | Admitting: Physical Therapy

## 2012-08-19 ENCOUNTER — Ambulatory Visit: Payer: Medicare Other | Admitting: Physical Therapy

## 2012-08-23 ENCOUNTER — Encounter: Payer: Medicare Other | Admitting: Physical Therapy

## 2012-08-24 ENCOUNTER — Ambulatory Visit: Payer: Medicare Other | Admitting: Physical Therapy

## 2012-08-30 ENCOUNTER — Encounter: Payer: Medicare Other | Admitting: Physical Therapy

## 2012-09-01 ENCOUNTER — Ambulatory Visit: Payer: Medicare Other | Attending: Orthopedic Surgery | Admitting: Physical Therapy

## 2012-09-01 DIAGNOSIS — M25519 Pain in unspecified shoulder: Secondary | ICD-10-CM | POA: Insufficient documentation

## 2012-09-01 DIAGNOSIS — M25619 Stiffness of unspecified shoulder, not elsewhere classified: Secondary | ICD-10-CM | POA: Insufficient documentation

## 2012-09-01 DIAGNOSIS — R5381 Other malaise: Secondary | ICD-10-CM | POA: Insufficient documentation

## 2012-09-01 DIAGNOSIS — IMO0001 Reserved for inherently not codable concepts without codable children: Secondary | ICD-10-CM | POA: Insufficient documentation

## 2012-09-02 ENCOUNTER — Ambulatory Visit: Payer: Medicare Other | Admitting: Physical Therapy

## 2012-09-06 ENCOUNTER — Ambulatory Visit: Payer: Medicare Other | Admitting: *Deleted

## 2012-09-06 DIAGNOSIS — J209 Acute bronchitis, unspecified: Secondary | ICD-10-CM | POA: Diagnosis not present

## 2012-09-08 ENCOUNTER — Ambulatory Visit: Payer: Medicare Other | Admitting: *Deleted

## 2012-09-13 ENCOUNTER — Ambulatory Visit: Payer: Medicare Other | Admitting: *Deleted

## 2012-09-15 ENCOUNTER — Encounter: Payer: Medicare Other | Admitting: *Deleted

## 2012-09-20 DIAGNOSIS — M546 Pain in thoracic spine: Secondary | ICD-10-CM | POA: Diagnosis not present

## 2012-09-20 DIAGNOSIS — S43429A Sprain of unspecified rotator cuff capsule, initial encounter: Secondary | ICD-10-CM | POA: Diagnosis not present

## 2012-09-20 DIAGNOSIS — Z9889 Other specified postprocedural states: Secondary | ICD-10-CM | POA: Diagnosis not present

## 2012-09-21 ENCOUNTER — Encounter: Payer: Medicare Other | Admitting: Physical Therapy

## 2012-09-23 ENCOUNTER — Encounter: Payer: Medicare Other | Admitting: Physical Therapy

## 2012-09-29 ENCOUNTER — Ambulatory Visit: Payer: Medicare Other | Attending: Orthopedic Surgery | Admitting: *Deleted

## 2012-09-29 DIAGNOSIS — M25519 Pain in unspecified shoulder: Secondary | ICD-10-CM | POA: Diagnosis not present

## 2012-09-29 DIAGNOSIS — IMO0001 Reserved for inherently not codable concepts without codable children: Secondary | ICD-10-CM | POA: Insufficient documentation

## 2012-09-29 DIAGNOSIS — R5381 Other malaise: Secondary | ICD-10-CM | POA: Insufficient documentation

## 2012-09-29 DIAGNOSIS — M25619 Stiffness of unspecified shoulder, not elsewhere classified: Secondary | ICD-10-CM | POA: Diagnosis not present

## 2012-10-11 DIAGNOSIS — M47817 Spondylosis without myelopathy or radiculopathy, lumbosacral region: Secondary | ICD-10-CM | POA: Diagnosis not present

## 2012-10-14 ENCOUNTER — Other Ambulatory Visit: Payer: Self-pay | Admitting: Neurosurgery

## 2012-10-14 DIAGNOSIS — I6203 Nontraumatic chronic subdural hemorrhage: Secondary | ICD-10-CM

## 2012-10-14 DIAGNOSIS — G919 Hydrocephalus, unspecified: Secondary | ICD-10-CM

## 2012-10-15 ENCOUNTER — Ambulatory Visit
Admission: RE | Admit: 2012-10-15 | Discharge: 2012-10-15 | Disposition: A | Discharge status: Home / Self Care | Payer: Medicare Other | Source: Ambulatory Visit | Attending: Neurosurgery | Admitting: Neurosurgery

## 2012-10-15 DIAGNOSIS — R51 Headache: Secondary | ICD-10-CM | POA: Diagnosis not present

## 2012-10-15 DIAGNOSIS — I6203 Nontraumatic chronic subdural hemorrhage: Secondary | ICD-10-CM

## 2012-10-15 DIAGNOSIS — F29 Unspecified psychosis not due to a substance or known physiological condition: Secondary | ICD-10-CM | POA: Diagnosis not present

## 2012-10-15 DIAGNOSIS — G919 Hydrocephalus, unspecified: Secondary | ICD-10-CM

## 2012-10-20 DIAGNOSIS — Z1231 Encounter for screening mammogram for malignant neoplasm of breast: Secondary | ICD-10-CM | POA: Diagnosis not present

## 2012-11-03 ENCOUNTER — Encounter: Payer: Self-pay | Admitting: Cardiology

## 2012-11-03 DIAGNOSIS — K219 Gastro-esophageal reflux disease without esophagitis: Secondary | ICD-10-CM | POA: Diagnosis not present

## 2012-11-03 DIAGNOSIS — N390 Urinary tract infection, site not specified: Secondary | ICD-10-CM | POA: Diagnosis not present

## 2012-11-03 DIAGNOSIS — I10 Essential (primary) hypertension: Secondary | ICD-10-CM | POA: Diagnosis not present

## 2012-11-03 DIAGNOSIS — R350 Frequency of micturition: Secondary | ICD-10-CM | POA: Diagnosis not present

## 2012-11-03 DIAGNOSIS — E785 Hyperlipidemia, unspecified: Secondary | ICD-10-CM | POA: Diagnosis not present

## 2012-11-03 DIAGNOSIS — E559 Vitamin D deficiency, unspecified: Secondary | ICD-10-CM | POA: Diagnosis not present

## 2012-11-03 DIAGNOSIS — E039 Hypothyroidism, unspecified: Secondary | ICD-10-CM | POA: Diagnosis not present

## 2012-11-03 DIAGNOSIS — E119 Type 2 diabetes mellitus without complications: Secondary | ICD-10-CM | POA: Diagnosis not present

## 2012-11-03 DIAGNOSIS — R82998 Other abnormal findings in urine: Secondary | ICD-10-CM | POA: Diagnosis not present

## 2012-11-10 DIAGNOSIS — S060X9A Concussion with loss of consciousness of unspecified duration, initial encounter: Secondary | ICD-10-CM | POA: Diagnosis not present

## 2012-12-23 ENCOUNTER — Other Ambulatory Visit (INDEPENDENT_AMBULATORY_CARE_PROVIDER_SITE_OTHER): Payer: Medicare Other

## 2012-12-23 DIAGNOSIS — Z1212 Encounter for screening for malignant neoplasm of rectum: Secondary | ICD-10-CM | POA: Diagnosis not present

## 2013-01-04 ENCOUNTER — Telehealth: Payer: Self-pay | Admitting: Family Medicine

## 2013-01-04 ENCOUNTER — Ambulatory Visit (INDEPENDENT_AMBULATORY_CARE_PROVIDER_SITE_OTHER): Payer: Medicare Other | Admitting: Family Medicine

## 2013-01-04 ENCOUNTER — Encounter: Payer: Self-pay | Admitting: Family Medicine

## 2013-01-04 ENCOUNTER — Ambulatory Visit (INDEPENDENT_AMBULATORY_CARE_PROVIDER_SITE_OTHER): Payer: Medicare Other

## 2013-01-04 VITALS — BP 126/77 | HR 70 | Temp 97.7°F | Ht 60.0 in | Wt 178.6 lb

## 2013-01-04 DIAGNOSIS — R0602 Shortness of breath: Secondary | ICD-10-CM | POA: Diagnosis not present

## 2013-01-04 DIAGNOSIS — R5383 Other fatigue: Secondary | ICD-10-CM

## 2013-01-04 DIAGNOSIS — W57XXXA Bitten or stung by nonvenomous insect and other nonvenomous arthropods, initial encounter: Secondary | ICD-10-CM

## 2013-01-04 DIAGNOSIS — R609 Edema, unspecified: Secondary | ICD-10-CM

## 2013-01-04 DIAGNOSIS — R5381 Other malaise: Secondary | ICD-10-CM | POA: Diagnosis not present

## 2013-01-04 DIAGNOSIS — T148 Other injury of unspecified body region: Secondary | ICD-10-CM | POA: Diagnosis not present

## 2013-01-04 LAB — BASIC METABOLIC PANEL WITH GFR
BUN: 14 mg/dL (ref 6–23)
Calcium: 10.1 mg/dL (ref 8.4–10.5)
GFR, Est African American: 81 mL/min
Glucose, Bld: 106 mg/dL — ABNORMAL HIGH (ref 70–99)

## 2013-01-04 LAB — POCT CBC
Lymph, poc: 2.7 (ref 0.6–3.4)
MCH, POC: 30.2 pg (ref 27–31.2)
MCV: 90.3 fL (ref 80–97)
Platelet Count, POC: 335 10*3/uL (ref 142–424)
RDW, POC: 13.2 %
WBC: 8.1 10*3/uL (ref 4.6–10.2)

## 2013-01-04 MED ORDER — FUROSEMIDE 20 MG PO TABS: ORAL_TABLET | ORAL | Status: DC

## 2013-01-04 NOTE — Patient Instructions (Addendum)
Take medications as directed Return to clinic in about 10 days for recheck Watch sodium intake more closely

## 2013-01-04 NOTE — Telephone Encounter (Signed)
APPT MADE

## 2013-01-04 NOTE — Progress Notes (Signed)
  Subjective:    Patient ID: Debra Phillips, female    DOB: 1928-09-12, 79 y.o.   MRN: [id number]  HPI Increased swelling in feet and legs for 2-3 weeks. Yesterday was a particularly bad day because of increased swelling and shortness of breath. She does not have any worsening breathing laying down than sitting up. Weight is up 2 lb. No increased sodium in diet. She also notes removing several ticks from her over the past 2-3 weeks. She cannot say how long they were attached.   Review of Systems  Constitutional: Positive for fatigue.  HENT: Negative for congestion.   Respiratory: Positive for cough (dry) and shortness of breath.   Cardiovascular: Positive for leg swelling (x 2 weeks).  Neurological: Positive for weakness, light-headedness and headaches.       Objective:   Physical Exam  Nursing note and vitals reviewed. Constitutional: She is oriented to person, place, and time. She appears well-developed and well-nourished. No distress.  HENT:  Head: Normocephalic and atraumatic.  Right Ear: External ear normal.  Left Ear: External ear normal.  Nose: Nose normal.  Mouth/Throat: Oropharynx is clear and moist.  Eyes: Conjunctivae are normal. Right eye exhibits no discharge. Left eye exhibits no discharge. No scleral icterus.  Neck: Normal range of motion. Neck supple. No thyromegaly present.  Cardiovascular: Normal rate, regular rhythm and normal heart sounds.  Exam reveals no friction rub.   No murmur heard. Pulmonary/Chest: Effort normal and breath sounds normal. No respiratory distress. She has no wheezes. She has no rales.  Abdominal: Soft. Bowel sounds are normal. She exhibits no mass. There is no tenderness. There is no guarding.  Slight epigastric tenderness  Musculoskeletal: She exhibits edema (1+ pretibial edema bilaterally).  Her motion is limited due to back pain.  Lymphadenopathy:    She has no cervical adenopathy.  Neurological: She is alert and oriented to person,  place, and time. She has normal reflexes.  Skin: Skin is warm and dry. No rash noted. No erythema.  Psychiatric: She has a normal mood and affect. Her behavior is normal. Judgment and thought content normal.   [facility] reading (PRIMARY) by  Dr. Christell Constant : CXR: Kyphotic and degenerative disc changes at the lower athero sclerotic changes in the aorta                                        Assessment & Plan:  1. SOB (shortness of breath) - POCT CBC - Brain natriuretic peptide - DG Chest 2 View - BASIC METABOLIC PANEL WITH GFR  2. Edema - POCT CBC - Brain natriuretic peptide - DG Chest 2 View - BASIC METABOLIC PANEL WITH GFR     - furosemide (LASIX) 20 MG tablet; 2 pills the first day then one pill daily thereafter for fluid  Dispense: 30 tablet; Refill: 3   Patient Instructions  Take medications as directed Return to clinic in about 10 days for recheck Watch sodium intake more closely

## 2013-01-13 ENCOUNTER — Encounter: Payer: Self-pay | Admitting: Family Medicine

## 2013-01-13 ENCOUNTER — Ambulatory Visit (INDEPENDENT_AMBULATORY_CARE_PROVIDER_SITE_OTHER): Payer: Medicare Other | Admitting: Family Medicine

## 2013-01-13 VITALS — BP 105/68 | HR 75 | Temp 98.4°F | Ht 60.0 in | Wt 179.0 lb

## 2013-01-13 DIAGNOSIS — R609 Edema, unspecified: Secondary | ICD-10-CM | POA: Diagnosis not present

## 2013-01-13 DIAGNOSIS — T148 Other injury of unspecified body region: Secondary | ICD-10-CM | POA: Diagnosis not present

## 2013-01-13 DIAGNOSIS — W57XXXA Bitten or stung by nonvenomous insect and other nonvenomous arthropods, initial encounter: Secondary | ICD-10-CM

## 2013-01-13 DIAGNOSIS — R0602 Shortness of breath: Secondary | ICD-10-CM | POA: Diagnosis not present

## 2013-01-13 NOTE — Patient Instructions (Addendum)
Continue current medications. Take Lasix or fluid pill only as needed Continue to watch sodium intake For ticks, you might try [place] Off Check skin regularly!!!!

## 2013-01-13 NOTE — Progress Notes (Signed)
  Subjective:    Patient ID: Debra Phillips, female    DOB: 1929/05/24, 79 y.o.   MRN: [id number]  HPI Patient says the swelling is better and her breathing is better even though she still fatigues easily. Lab work from the previous visit was reviewed with patient and everything looked good. The chest x-ray did not show any sign of any infection. She does present today with another tick on her which we promptly removed. Was on her left posterior shoulder. She said this it probably was not there but for less than 24 hour. No purulent sputum. No fever.   Review of Systems  Constitutional: Positive for fatigue.  Eyes: Positive for itching (very dry).  Respiratory: Positive for cough and wheezing (at times).   Cardiovascular: Positive for leg swelling (ankle edema).  Gastrointestinal: Negative.   Endocrine: Negative.   Genitourinary: Negative.   Musculoskeletal: Negative.   Skin: Positive for wound (possible tick on back).  Allergic/Immunologic: Negative.   Neurological: Negative.   Hematological: Negative.   Psychiatric/Behavioral: Negative.    Labs reviewed with patient from last week    Objective:   Physical Exam Patient alert and feeling well. Her edema is less, only 1+ today. HEENT within normal Neck within normal limit Heart had a regular rate and rhythm without any murmurs or gallops Lungs were clear anteriorly and posteriorly Abdomen was soft to palpation without tenderness or enlargement or mass Extremities 1+ or less edema bilaterally       Assessment & Plan:  1. Edema - BASIC METABOLIC PANEL WITH GFR  2. Tick bite  3. SOB (shortness of breath) -Improved Patient Instructions  Continue current medications. Take Lasix or fluid pill only as needed Continue to watch sodium intake For ticks, you might try [place] Off Check skin regularly!!!!

## 2013-01-25 ENCOUNTER — Other Ambulatory Visit: Payer: Self-pay

## 2013-01-25 MED ORDER — METFORMIN HCL 500 MG PO TABS: 500.0000 mg | ORAL_TABLET | Freq: Two times a day (BID) | ORAL | Status: DC

## 2013-01-25 MED ORDER — LISINOPRIL-HYDROCHLOROTHIAZIDE 20-12.5 MG PO TABS: 1.0000 | ORAL_TABLET | Freq: Every day | ORAL | Status: DC

## 2013-02-26 ENCOUNTER — Other Ambulatory Visit: Payer: Self-pay | Admitting: Family Medicine

## 2013-03-11 DIAGNOSIS — K219 Gastro-esophageal reflux disease without esophagitis: Secondary | ICD-10-CM | POA: Diagnosis not present

## 2013-03-11 DIAGNOSIS — R159 Full incontinence of feces: Secondary | ICD-10-CM | POA: Diagnosis not present

## 2013-03-11 DIAGNOSIS — R1310 Dysphagia, unspecified: Secondary | ICD-10-CM | POA: Diagnosis not present

## 2013-03-14 ENCOUNTER — Ambulatory Visit (INDEPENDENT_AMBULATORY_CARE_PROVIDER_SITE_OTHER): Payer: Medicare Other | Admitting: Family Medicine

## 2013-03-14 ENCOUNTER — Encounter: Payer: Self-pay | Admitting: Family Medicine

## 2013-03-14 VITALS — BP 128/67 | HR 66 | Temp 97.4°F | Ht 60.0 in | Wt 175.0 lb

## 2013-03-14 DIAGNOSIS — R7309 Other abnormal glucose: Secondary | ICD-10-CM

## 2013-03-14 DIAGNOSIS — I10 Essential (primary) hypertension: Secondary | ICD-10-CM | POA: Diagnosis not present

## 2013-03-14 DIAGNOSIS — N390 Urinary tract infection, site not specified: Secondary | ICD-10-CM | POA: Diagnosis not present

## 2013-03-14 DIAGNOSIS — R300 Dysuria: Secondary | ICD-10-CM

## 2013-03-14 DIAGNOSIS — E785 Hyperlipidemia, unspecified: Secondary | ICD-10-CM | POA: Diagnosis not present

## 2013-03-14 DIAGNOSIS — I2510 Atherosclerotic heart disease of native coronary artery without angina pectoris: Secondary | ICD-10-CM | POA: Diagnosis not present

## 2013-03-14 DIAGNOSIS — R7989 Other specified abnormal findings of blood chemistry: Secondary | ICD-10-CM

## 2013-03-14 DIAGNOSIS — K219 Gastro-esophageal reflux disease without esophagitis: Secondary | ICD-10-CM

## 2013-03-14 DIAGNOSIS — M1990 Unspecified osteoarthritis, unspecified site: Secondary | ICD-10-CM

## 2013-03-14 DIAGNOSIS — R002 Palpitations: Secondary | ICD-10-CM

## 2013-03-14 LAB — BASIC METABOLIC PANEL WITH GFR
CO2: 28 mEq/L (ref 19–32)
Chloride: 100 mEq/L (ref 96–112)
Creat: 0.77 mg/dL (ref 0.50–1.10)
Potassium: 4.9 mEq/L (ref 3.5–5.3)
Sodium: 136 mEq/L (ref 135–145)

## 2013-03-14 LAB — HEPATIC FUNCTION PANEL
AST: 16 U/L (ref 0–37)
Alkaline Phosphatase: 62 U/L (ref 39–117)
Bilirubin, Direct: 0.1 mg/dL (ref 0.0–0.3)
Indirect Bilirubin: 0.4 mg/dL (ref 0.0–0.9)
Total Bilirubin: 0.5 mg/dL (ref 0.3–1.2)

## 2013-03-14 LAB — POCT URINALYSIS DIPSTICK
Bilirubin, UA: NEGATIVE
Glucose, UA: NEGATIVE
Nitrite, UA: NEGATIVE
Spec Grav, UA: <=1.005

## 2013-03-14 LAB — POCT UA - MICROSCOPIC ONLY: Crystals, Ur, HPF, POC: NEGATIVE

## 2013-03-14 NOTE — Addendum Note (Signed)
Addended by: Monica Becton on: 03/14/2013 06:12 PM   Modules accepted: Orders

## 2013-03-14 NOTE — Progress Notes (Signed)
  Subjective:    Patient ID: Debra Phillips, female    DOB: 15-Jan-1929, 79 y.o.   MRN: [id number]  HPI Patient returns to clinic today for followup of chronic medical problems. These include diabetes mellitus type 2, hypertension, hyperlipidemia, and chronic back pain both upper and lower. On her health maintenance, the only item that is passed view is her shingles shot. Edema in her legs is better since on the Lasix. Patient is going to see Dr. Channing Mutters, tomorrow regarding her shoulder and neck pain. She complains of weakness and fatigue along with her chronic low back pain and neck and shoulder. The problems voiding started yesterday with burning and pain. She is also getting ready to have another endoscopy and dilatation for an esophageal stricture by Dr. Jason Fila.   Review of Systems  Constitutional: Positive for fatigue.  HENT: Positive for neck pain (and left shoulder pain).   Eyes: Negative.   Respiratory: Negative.   Cardiovascular: Positive for leg swelling (better since on lasix).  Gastrointestinal: Negative.   Endocrine: Negative.   Genitourinary: Positive for dysuria, frequency and difficulty urinating.  Skin: Negative.   Allergic/Immunologic: Negative.   Neurological: Positive for weakness.  Hematological: Negative.   Psychiatric/Behavioral: Negative.        Objective:   Physical Exam BP 128/67  Pulse 66  Temp(Src) 97.4 F (36.3 C) (Oral)  Ht 5' (1.524 m)  Wt 175 lb (79.379 kg)  BMI 34.18 kg/m2  The patient appeared well nourished and normally developed, somewhat overweight, alert and oriented to time and place. Speech, behavior and judgement appear normal. Vital signs as documented.  Head exam is unremarkable. No scleral icterus or pallor noted. Ears nose and throat are within normal limit. She has upper lower dentures in place.  Neck is without jugular venous distension, thyromegally, or carotid bruits. Carotid upstrokes are brisk bilaterally. No cervical  adenopathy. Lungs are clear anteriorly and posteriorly to auscultation. Normal respiratory effort. There is no axillary adenopathy Cardiac exam reveals regular rate and rhythm at 72 per min. First and second heart sounds normal.  No murmurs, rubs or gallops.  Abdominal exam reveals normal bowl sounds, no masses, no organomegaly and no aortic enlargement. No inguinal adenopathy. Slight epigastric tenderness, no suprapubic tenderness. Extremities are nonedematous and both femoral and pedal pulses are normal. Skin without pallor or jaundice.  Warm and dry, without rash. She does have an eraser sized nevus over her right scapula, for which she is going to see a dermatologist about. Neurologic exam reveals normal deep tendon reflexes and normal sensation.        Assessment & Plan:  1. HTN (hypertension) - BASIC METABOLIC PANEL WITH GFR - Hepatic function panel  2. Other and unspecified hyperlipidemia - NMR Lipoprofile with Lipids - Hepatic function panel  3. Dysuria - POCT urinalysis dipstick - POCT UA - Microscopic Only  4. CAD  5. Chronic low back pain  6. Dysplastic nevus right scapula area -Patient has appointment to see dermatologist   7. Esophageal stricture -Has appointment with gastroenterologist for endoscopy and dilatation  8. OSTEOARTHRITIS  9. HYPERTENSION  7. GERD  8. Palpitations   Patient Instructions  Keep appointments as planned Be careful to put herself at risk of falling   Nyra Capes MD

## 2013-03-14 NOTE — Patient Instructions (Signed)
Keep appointments as planned Be careful to put herself at risk of falling

## 2013-03-15 ENCOUNTER — Telehealth: Payer: Self-pay | Admitting: Family Medicine

## 2013-03-15 DIAGNOSIS — M47817 Spondylosis without myelopathy or radiculopathy, lumbosacral region: Secondary | ICD-10-CM | POA: Diagnosis not present

## 2013-03-15 DIAGNOSIS — M48061 Spinal stenosis, lumbar region without neurogenic claudication: Secondary | ICD-10-CM | POA: Diagnosis not present

## 2013-03-15 DIAGNOSIS — M1990 Unspecified osteoarthritis, unspecified site: Secondary | ICD-10-CM | POA: Diagnosis not present

## 2013-03-15 DIAGNOSIS — IMO0002 Reserved for concepts with insufficient information to code with codable children: Secondary | ICD-10-CM | POA: Diagnosis not present

## 2013-03-15 LAB — NMR LIPOPROFILE WITH LIPIDS
Cholesterol, Total: 182 mg/dL (ref <200)
HDL Particle Number: 33.6 umol/L (ref >=30.5)
LDL (calc): 101 mg/dL — ABNORMAL HIGH (ref <100)
LP-IR Score: 46 — ABNORMAL HIGH (ref <=45)
Small LDL Particle Number: 718 nmol/L — ABNORMAL HIGH (ref <=527)
Triglycerides: 205 mg/dL — ABNORMAL HIGH (ref <150)

## 2013-03-15 NOTE — Addendum Note (Signed)
Addended by: Orma Render F on: 03/15/2013 10:27 AM   Modules accepted: Orders

## 2013-03-16 ENCOUNTER — Telehealth: Payer: Self-pay | Admitting: Family Medicine

## 2013-03-16 LAB — URINE CULTURE

## 2013-03-17 ENCOUNTER — Telehealth: Payer: Self-pay | Admitting: Family Medicine

## 2013-03-17 NOTE — Telephone Encounter (Signed)
PT CALLED

## 2013-03-18 NOTE — Telephone Encounter (Signed)
Refill for lancets called into [facility]

## 2013-03-24 ENCOUNTER — Other Ambulatory Visit: Payer: Self-pay | Admitting: *Deleted

## 2013-03-24 DIAGNOSIS — D485 Neoplasm of uncertain behavior of skin: Secondary | ICD-10-CM | POA: Diagnosis not present

## 2013-03-24 DIAGNOSIS — E119 Type 2 diabetes mellitus without complications: Secondary | ICD-10-CM

## 2013-03-24 MED ORDER — CITALOPRAM HYDROBROMIDE 20 MG PO TABS: 20.0000 mg | ORAL_TABLET | Freq: Every day | ORAL | Status: DC

## 2013-03-24 MED ORDER — ONETOUCH ULTRASOFT LANCETS MISC: Status: DC

## 2013-03-29 ENCOUNTER — Other Ambulatory Visit: Payer: Self-pay | Admitting: *Deleted

## 2013-03-29 DIAGNOSIS — E119 Type 2 diabetes mellitus without complications: Secondary | ICD-10-CM

## 2013-03-29 LAB — POCT GLYCOSYLATED HEMOGLOBIN (HGB A1C): Hemoglobin A1C: 5.9

## 2013-03-29 MED ORDER — ONETOUCH ULTRASOFT LANCETS MISC: Status: DC

## 2013-04-04 ENCOUNTER — Other Ambulatory Visit (INDEPENDENT_AMBULATORY_CARE_PROVIDER_SITE_OTHER): Payer: Medicare Other

## 2013-04-04 DIAGNOSIS — R799 Abnormal finding of blood chemistry, unspecified: Secondary | ICD-10-CM

## 2013-04-04 DIAGNOSIS — N390 Urinary tract infection, site not specified: Secondary | ICD-10-CM

## 2013-04-04 LAB — POCT UA - MICROSCOPIC ONLY
Crystals, Ur, HPF, POC: NEGATIVE
Mucus, UA: NEGATIVE
RBC, urine, microscopic: NEGATIVE
WBC, Ur, HPF, POC: NEGATIVE

## 2013-04-04 LAB — POCT URINALYSIS DIPSTICK
Blood, UA: NEGATIVE
Glucose, UA: NEGATIVE
Ketones, UA: NEGATIVE
Spec Grav, UA: <=1.005
Urobilinogen, UA: NEGATIVE

## 2013-04-04 NOTE — Progress Notes (Signed)
Pt dropped off urine for recheck only

## 2013-04-05 DIAGNOSIS — R1310 Dysphagia, unspecified: Secondary | ICD-10-CM | POA: Diagnosis not present

## 2013-04-05 DIAGNOSIS — K222 Esophageal obstruction: Secondary | ICD-10-CM | POA: Diagnosis not present

## 2013-04-07 DIAGNOSIS — D3000 Benign neoplasm of unspecified kidney: Secondary | ICD-10-CM | POA: Diagnosis not present

## 2013-04-07 DIAGNOSIS — N281 Cyst of kidney, acquired: Secondary | ICD-10-CM | POA: Diagnosis not present

## 2013-04-07 DIAGNOSIS — R1011 Right upper quadrant pain: Secondary | ICD-10-CM | POA: Diagnosis not present

## 2013-04-23 ENCOUNTER — Other Ambulatory Visit: Payer: Self-pay | Admitting: Family Medicine

## 2013-04-26 ENCOUNTER — Other Ambulatory Visit: Payer: Self-pay | Admitting: Family Medicine

## 2013-05-04 ENCOUNTER — Ambulatory Visit (INDEPENDENT_AMBULATORY_CARE_PROVIDER_SITE_OTHER): Payer: Medicare Other | Admitting: General Practice

## 2013-05-04 VITALS — BP 144/79 | HR 66 | Temp 97.8°F | Wt 175.0 lb

## 2013-05-04 DIAGNOSIS — N390 Urinary tract infection, site not specified: Secondary | ICD-10-CM

## 2013-05-04 DIAGNOSIS — R300 Dysuria: Secondary | ICD-10-CM | POA: Diagnosis not present

## 2013-05-04 DIAGNOSIS — E1059 Type 1 diabetes mellitus with other circulatory complications: Secondary | ICD-10-CM | POA: Diagnosis not present

## 2013-05-04 LAB — POCT UA - MICROSCOPIC ONLY
Bacteria, U Microscopic: NEGATIVE
Casts, Ur, LPF, POC: NEGATIVE
Crystals, Ur, HPF, POC: NEGATIVE
Mucus, UA: NEGATIVE
Yeast, UA: NEGATIVE

## 2013-05-04 MED ORDER — CIPROFLOXACIN HCL 500 MG PO TABS: 500.0000 mg | ORAL_TABLET | Freq: Two times a day (BID) | ORAL | Status: DC

## 2013-05-04 NOTE — Progress Notes (Signed)
  Subjective:    Patient ID: Debra Phillips, female    DOB: Aug 09, 1929, 79 y.o.   MRN: [id number]  Urinary Tract Infection  This is a new problem. The current episode started in the past 7 days. The problem occurs every urination. The problem has been gradually worsening. The quality of the pain is described as aching and burning. The pain is at a severity of 3/10. The pain is mild. There has been no fever. She is not sexually active. There is no history of pyelonephritis. Associated symptoms include flank pain, frequency and urgency. Pertinent negatives include no chills, hematuria, nausea or vomiting. She has tried nothing for the symptoms. Her past medical history is significant for recurrent UTIs.      Review of Systems  Constitutional: Negative for fever and chills.  Respiratory: Negative for chest tightness and shortness of breath.   Cardiovascular: Negative for chest pain and palpitations.  Gastrointestinal: Positive for abdominal pain. Negative for nausea, vomiting and blood in stool.  Genitourinary: Positive for urgency, frequency and flank pain. Negative for dysuria, hematuria and difficulty urinating.  Neurological: Negative for dizziness, weakness and headaches.       Objective:   Physical Exam  Constitutional: She is oriented to person, place, and time. She appears well-developed and well-nourished.  Cardiovascular: Normal rate, regular rhythm and normal heart sounds.   Pulmonary/Chest: Effort normal and breath sounds normal. No respiratory distress. She exhibits no tenderness.  Abdominal: Soft. Bowel sounds are normal. She exhibits no distension. There is tenderness in the suprapubic area. There is no CVA tenderness.  Neurological: She is alert and oriented to person, place, and time.  Skin: Skin is warm and dry.  Psychiatric: She has a normal mood and affect.    Results for orders placed in visit on 05/04/13  POCT UA - MICROALBUMIN      Result Value Range   Microalbumin Ur, POC 50    POCT UA - MICROSCOPIC ONLY      Result Value Range   WBC, Ur, HPF, POC 20-30     RBC, urine, microscopic 5-10     Bacteria, U Microscopic neg     Mucus, UA neg     Epithelial cells, urine per micros occ     Crystals, Ur, HPF, POC neg     Casts, Ur, LPF, POC neg     Yeast, UA neg           Assessment & Plan:  1. Burning with urination - POCT UA - Microalbumin - POCT UA - Microscopic Only  2. UTI (urinary tract infection) - Urine culture - Microalbumin, urine - ciprofloxacin (CIPRO) 500 MG tablet; Take 1 tablet (500 mg total) by mouth 2 (two) times daily.  Dispense: 20 tablet; Refill: 0 -Increase fluid intake AZO over the counter X2 days Frequent voiding Proper perineal hygiene RTO if symptoms worsen or unresolved Patient verbalized understanding Coralie Keens, FNP-C

## 2013-05-04 NOTE — Patient Instructions (Addendum)
Urinary Tract Infection  Urinary tract infections (UTIs) can develop anywhere along your urinary tract. Your urinary tract is your body's drainage system for removing wastes and extra water. Your urinary tract includes two kidneys, two ureters, a bladder, and a urethra. Your kidneys are a pair of bean-shaped organs. Each kidney is about the size of your fist. They are located below your ribs, one on each side of your spine.  CAUSES  Infections are caused by microbes, which are microscopic organisms, including fungi, viruses, and bacteria. These organisms are so small that they can only be seen through a microscope. Bacteria are the microbes that most commonly cause UTIs.  SYMPTOMS   Symptoms of UTIs may vary by age and gender of the patient and by the location of the infection. Symptoms in young women typically include a frequent and intense urge to urinate and a painful, burning feeling in the bladder or urethra during urination. Older women and men are more likely to be tired, shaky, and weak and have muscle aches and abdominal pain. A fever may mean the infection is in your kidneys. Other symptoms of a kidney infection include pain in your back or sides below the ribs, nausea, and vomiting.  DIAGNOSIS  To diagnose a UTI, your caregiver will ask you about your symptoms. Your caregiver also will ask to provide a urine sample. The urine sample will be tested for bacteria and white blood cells. White blood cells are made by your body to help fight infection.  TREATMENT   Typically, UTIs can be treated with medication. Because most UTIs are caused by a bacterial infection, they usually can be treated with the use of antibiotics. The choice of antibiotic and length of treatment depend on your symptoms and the type of bacteria causing your infection.  HOME CARE INSTRUCTIONS   If you were prescribed antibiotics, take them exactly as your caregiver instructs you. Finish the medication even if you feel better after you  have only taken some of the medication.   Drink enough water and fluids to keep your urine clear or pale yellow.   Avoid caffeine, tea, and carbonated beverages. They tend to irritate your bladder.   Empty your bladder often. Avoid holding urine for long periods of time.   Empty your bladder before and after sexual intercourse.   After a bowel movement, women should cleanse from front to back. Use each tissue only once.  SEEK MEDICAL CARE IF:    You have back pain.   You develop a fever.   Your symptoms do not begin to resolve within 3 days.  SEEK IMMEDIATE MEDICAL CARE IF:    You have severe back pain or lower abdominal pain.   You develop chills.   You have nausea or vomiting.   You have continued burning or discomfort with urination.  MAKE SURE YOU:    Understand these instructions.   Will watch your condition.   Will get help right away if you are not doing well or get worse.  Document Released: 05/21/2005 Document Revised: 02/10/2012 Document Reviewed: 09/19/2011  [employer] Patient Information 2014 [employer].

## 2013-05-06 ENCOUNTER — Other Ambulatory Visit: Payer: Self-pay | Admitting: General Practice

## 2013-05-10 ENCOUNTER — Telehealth: Payer: Self-pay | Admitting: *Deleted

## 2013-05-10 NOTE — Telephone Encounter (Signed)
Find out if patinet has trid anything else other than dexilant- ins doesn't want to cover it

## 2013-05-10 NOTE — Telephone Encounter (Signed)
Ins denied dexilant saying they need the prescriber to send a letter of medical necessity indicating that this drug  Is necessary because:  It must say none of the covered drugs on the formulary will be as effective, or those drugs would have adverse effects. Or they said provider could call [phone] within 14 calendar days from 05-09-13 and discuss coverage.. I told them that she had documented prilosec and prevacid. And documented failure guess they want to hear it from you.

## 2013-06-06 DIAGNOSIS — Z23 Encounter for immunization: Secondary | ICD-10-CM | POA: Diagnosis not present

## 2013-06-09 DIAGNOSIS — R1310 Dysphagia, unspecified: Secondary | ICD-10-CM | POA: Diagnosis not present

## 2013-06-09 DIAGNOSIS — K219 Gastro-esophageal reflux disease without esophagitis: Secondary | ICD-10-CM | POA: Diagnosis not present

## 2013-07-01 DIAGNOSIS — M1990 Unspecified osteoarthritis, unspecified site: Secondary | ICD-10-CM | POA: Diagnosis not present

## 2013-07-01 DIAGNOSIS — M48061 Spinal stenosis, lumbar region without neurogenic claudication: Secondary | ICD-10-CM | POA: Diagnosis not present

## 2013-07-01 DIAGNOSIS — M76899 Other specified enthesopathies of unspecified lower limb, excluding foot: Secondary | ICD-10-CM | POA: Diagnosis not present

## 2013-07-08 DIAGNOSIS — H04129 Dry eye syndrome of unspecified lacrimal gland: Secondary | ICD-10-CM | POA: Diagnosis not present

## 2013-07-08 DIAGNOSIS — H35379 Puckering of macula, unspecified eye: Secondary | ICD-10-CM | POA: Diagnosis not present

## 2013-07-08 DIAGNOSIS — E119 Type 2 diabetes mellitus without complications: Secondary | ICD-10-CM | POA: Diagnosis not present

## 2013-07-08 DIAGNOSIS — H353190 Nonexudative age-related macular degeneration, unspecified eye, stage unspecified: Secondary | ICD-10-CM | POA: Diagnosis not present

## 2013-07-19 ENCOUNTER — Other Ambulatory Visit: Payer: Self-pay | Admitting: Family Medicine

## 2013-07-19 DIAGNOSIS — M48061 Spinal stenosis, lumbar region without neurogenic claudication: Secondary | ICD-10-CM | POA: Diagnosis not present

## 2013-07-19 DIAGNOSIS — M76899 Other specified enthesopathies of unspecified lower limb, excluding foot: Secondary | ICD-10-CM | POA: Diagnosis not present

## 2013-07-19 DIAGNOSIS — M47817 Spondylosis without myelopathy or radiculopathy, lumbosacral region: Secondary | ICD-10-CM | POA: Diagnosis not present

## 2013-07-19 DIAGNOSIS — M1990 Unspecified osteoarthritis, unspecified site: Secondary | ICD-10-CM | POA: Diagnosis not present

## 2013-07-24 ENCOUNTER — Other Ambulatory Visit: Payer: Self-pay | Admitting: Family Medicine

## 2013-07-26 NOTE — Telephone Encounter (Signed)
Last seen 03/14/13 DWM  Requesting a 90 day supply

## 2013-07-28 DIAGNOSIS — M47817 Spondylosis without myelopathy or radiculopathy, lumbosacral region: Secondary | ICD-10-CM | POA: Diagnosis not present

## 2013-07-28 DIAGNOSIS — Z7982 Long term (current) use of aspirin: Secondary | ICD-10-CM | POA: Diagnosis not present

## 2013-07-28 DIAGNOSIS — I10 Essential (primary) hypertension: Secondary | ICD-10-CM | POA: Diagnosis not present

## 2013-07-28 DIAGNOSIS — Z883 Allergy status to other anti-infective agents status: Secondary | ICD-10-CM | POA: Diagnosis not present

## 2013-07-28 DIAGNOSIS — M76899 Other specified enthesopathies of unspecified lower limb, excluding foot: Secondary | ICD-10-CM | POA: Diagnosis not present

## 2013-07-28 DIAGNOSIS — Z96659 Presence of unspecified artificial knee joint: Secondary | ICD-10-CM | POA: Diagnosis not present

## 2013-07-28 DIAGNOSIS — Z882 Allergy status to sulfonamides status: Secondary | ICD-10-CM | POA: Diagnosis not present

## 2013-07-28 DIAGNOSIS — G8929 Other chronic pain: Secondary | ICD-10-CM | POA: Diagnosis not present

## 2013-07-28 DIAGNOSIS — I519 Heart disease, unspecified: Secondary | ICD-10-CM | POA: Diagnosis not present

## 2013-07-28 DIAGNOSIS — Z79899 Other long term (current) drug therapy: Secondary | ICD-10-CM | POA: Diagnosis not present

## 2013-07-28 DIAGNOSIS — M810 Age-related osteoporosis without current pathological fracture: Secondary | ICD-10-CM | POA: Diagnosis not present

## 2013-07-28 DIAGNOSIS — E119 Type 2 diabetes mellitus without complications: Secondary | ICD-10-CM | POA: Diagnosis not present

## 2013-07-28 DIAGNOSIS — F329 Major depressive disorder, single episode, unspecified: Secondary | ICD-10-CM | POA: Diagnosis not present

## 2013-07-28 DIAGNOSIS — Z880 Allergy status to penicillin: Secondary | ICD-10-CM | POA: Diagnosis not present

## 2013-07-28 DIAGNOSIS — Z809 Family history of malignant neoplasm, unspecified: Secondary | ICD-10-CM | POA: Diagnosis not present

## 2013-07-28 DIAGNOSIS — K219 Gastro-esophageal reflux disease without esophagitis: Secondary | ICD-10-CM | POA: Diagnosis not present

## 2013-07-28 DIAGNOSIS — M1990 Unspecified osteoarthritis, unspecified site: Secondary | ICD-10-CM | POA: Diagnosis not present

## 2013-07-28 DIAGNOSIS — J449 Chronic obstructive pulmonary disease, unspecified: Secondary | ICD-10-CM | POA: Diagnosis not present

## 2013-07-28 DIAGNOSIS — Z885 Allergy status to narcotic agent status: Secondary | ICD-10-CM | POA: Diagnosis not present

## 2013-08-09 ENCOUNTER — Other Ambulatory Visit: Payer: Self-pay | Admitting: Family Medicine

## 2013-08-10 NOTE — Telephone Encounter (Signed)
Last seen 03/14/13  DWM

## 2013-08-11 ENCOUNTER — Telehealth: Payer: Self-pay | Admitting: Family Medicine

## 2013-08-11 DIAGNOSIS — E119 Type 2 diabetes mellitus without complications: Secondary | ICD-10-CM

## 2013-08-11 MED ORDER — CITALOPRAM HYDROBROMIDE 20 MG PO TABS: ORAL_TABLET | ORAL | Status: DC

## 2013-08-11 NOTE — Telephone Encounter (Signed)
done

## 2013-08-24 ENCOUNTER — Encounter: Payer: Self-pay | Admitting: Family Medicine

## 2013-08-24 ENCOUNTER — Ambulatory Visit (INDEPENDENT_AMBULATORY_CARE_PROVIDER_SITE_OTHER): Payer: Medicare Other | Admitting: Family Medicine

## 2013-08-24 VITALS — BP 134/74 | HR 77 | Temp 98.4°F | Ht 60.0 in | Wt 177.0 lb

## 2013-08-24 DIAGNOSIS — Z23 Encounter for immunization: Secondary | ICD-10-CM | POA: Diagnosis not present

## 2013-08-24 DIAGNOSIS — M545 Low back pain: Secondary | ICD-10-CM

## 2013-08-24 DIAGNOSIS — I2510 Atherosclerotic heart disease of native coronary artery without angina pectoris: Secondary | ICD-10-CM

## 2013-08-24 DIAGNOSIS — K219 Gastro-esophageal reflux disease without esophagitis: Secondary | ICD-10-CM

## 2013-08-24 DIAGNOSIS — E785 Hyperlipidemia, unspecified: Secondary | ICD-10-CM | POA: Diagnosis not present

## 2013-08-24 DIAGNOSIS — M1990 Unspecified osteoarthritis, unspecified site: Secondary | ICD-10-CM

## 2013-08-24 DIAGNOSIS — I10 Essential (primary) hypertension: Secondary | ICD-10-CM

## 2013-08-24 DIAGNOSIS — N390 Urinary tract infection, site not specified: Secondary | ICD-10-CM

## 2013-08-24 DIAGNOSIS — E559 Vitamin D deficiency, unspecified: Secondary | ICD-10-CM | POA: Diagnosis not present

## 2013-08-24 DIAGNOSIS — E119 Type 2 diabetes mellitus without complications: Secondary | ICD-10-CM | POA: Diagnosis not present

## 2013-08-24 DIAGNOSIS — N3941 Urge incontinence: Secondary | ICD-10-CM | POA: Diagnosis not present

## 2013-08-24 LAB — POCT URINALYSIS DIPSTICK
Bilirubin, UA: NEGATIVE
Glucose, UA: NEGATIVE
Ketones, UA: NEGATIVE
Nitrite, UA: NEGATIVE
Protein, UA: NEGATIVE
Spec Grav, UA: 1.015
Urobilinogen, UA: NEGATIVE
pH, UA: 6

## 2013-08-24 LAB — POCT CBC
HCT, POC: 40 % (ref 37.7–47.9)
Hemoglobin: 13.3 g/dL (ref 12.2–16.2)
MCH, POC: 30.1 pg (ref 27–31.2)
MCHC: 33.2 g/dL (ref 31.8–35.4)
MCV: 90.5 fL (ref 80–97)
MPV: 6.7 fL (ref 0–99.8)
POC LYMPH PERCENT: 22.9 %L (ref 10–50)
RDW, POC: 13.4 %
WBC: 11 10*3/uL — AB (ref 4.6–10.2)

## 2013-08-24 LAB — POCT UA - MICROSCOPIC ONLY: Yeast, UA: NEGATIVE

## 2013-08-24 MED ORDER — CIPROFLOXACIN HCL 500 MG PO TABS: 500.0000 mg | ORAL_TABLET | Freq: Two times a day (BID) | ORAL | Status: DC

## 2013-08-24 NOTE — Progress Notes (Signed)
Subjective:    Patient ID: Debra Phillips, female    DOB: 1929-05-24, 79 y.o.   MRN: [id number]  HPI Pt here for follow up and management of chronic medical problems. Patient is pleasant and has her usual complaints with her back and some lightheadedness. She just received an injection in her left hip.       Patient Active Problem List   Diagnosis Date Noted  . Precordial pain 02/16/2012  . PALPITATIONS 02/27/2010  . CAD 12/12/2009  . Aortic aneurysm of unspecified site without mention of rupture 06/27/2009  . DYSPNEA 06/27/2009  . DYSLIPIDEMIA 12/12/2008  . HYPERTENSION 12/12/2008  . GERD 12/12/2008  . OSTEOARTHRITIS 12/12/2008   Outpatient Encounter Prescriptions as of 08/24/2013  Medication Sig  . ascorbic acid (VITAMIN C) 500 MG tablet Take 500 mg by mouth daily.   Marland Kitchen aspirin 81 MG tablet Take 81 mg by mouth daily.    . calcium carbonate (OS-CAL) 600 MG TABS Take 600 mg by mouth daily.    . Cholecalciferol 1000 UNITS tablet Take 2,000 Units by mouth daily.    . Cinnamon 500 MG capsule Take 1,000 mg by mouth daily.    . citalopram (CELEXA) 20 MG tablet One tab po qd as directed  . DEXILANT 60 MG capsule TAKE ONE CAPSULE BY MOUTH ONE TIME DAILY  . diltiazem (CARDIZEM CD) 180 MG 24 hr capsule TAKE ONE CAPSULE BY MOUTH ONE TIME DAILY  . diltiazem (DILACOR XR) 180 MG 24 hr capsule Take 180 mg by mouth daily.    . furosemide (LASIX) 20 MG tablet 2 pills the first day then one pill daily thereafter for fluid  . Lancets (ONETOUCH ULTRASOFT) lancets Use to check blood sugar once daily and prn  . lisinopril-hydrochlorothiazide (PRINZIDE,ZESTORETIC) 20-12.5 MG per tablet TAKE ONE TABLET BY MOUTH ONE TIME DAILY  . metFORMIN (GLUCOPHAGE) 500 MG tablet TAKE ONE TABLET BY MOUTH TWICE DAILY  . multivitamin (THERAGRAN) per tablet Take 1 tablet by mouth daily.    . Omega-3 Fatty Acids (FISH OIL) 1000 MG CAPS Take 2,000 mg by mouth daily.    . ONE TOUCH ULTRA TEST test strip USE TO CHECK  BLOOD GLUCOSE ONCE DAILY  . rosuvastatin (CRESTOR) 5 MG tablet Take 5 mg by mouth daily.    . traMADol (ULTRAM) 50 MG tablet Take 50 mg by mouth as needed.    . [DISCONTINUED] ciprofloxacin (CIPRO) 500 MG tablet Take 1 tablet (500 mg total) by mouth 2 (two) times daily.    Review of Systems  Constitutional: Negative.   HENT: Negative.   Eyes: Negative.   Respiratory: Negative.   Cardiovascular: Negative.   Gastrointestinal: Negative.   Endocrine: Negative.   Genitourinary: Negative.   Musculoskeletal: Positive for back pain (and hip pain- recent injection).  Skin: Negative.   Allergic/Immunologic: Negative.   Neurological: Positive for light-headedness (off balance at times).  Hematological: Negative.   Psychiatric/Behavioral: Negative.        Objective:   Physical Exam  Nursing note and vitals reviewed. Constitutional: She is oriented to person, place, and time. She appears well-developed and well-nourished. No distress.  For her age  HENT:  Head: Normocephalic and atraumatic.  Right Ear: External ear normal.  Left Ear: External ear normal.  Nose: Nose normal.  Mouth/Throat: Oropharynx is clear and moist.  Eyes: Conjunctivae and EOM are normal. Pupils are equal, round, and reactive to light. Right eye exhibits no discharge. Left eye exhibits no discharge. No scleral icterus.  Neck:  Normal range of motion. Neck supple. No thyromegaly present.  No carotid bruits  Cardiovascular: Normal rate, regular rhythm, normal heart sounds and intact distal pulses.  Exam reveals no gallop and no friction rub.   No murmur heard. At 72 per minute  Pulmonary/Chest: Effort normal and breath sounds normal. No respiratory distress. She has no wheezes. She has no rales. She exhibits no tenderness.  Abdominal: Soft. Bowel sounds are normal. She exhibits no mass. There is tenderness. There is no rebound and no guarding.  Slight upper abdominal tenderness  Musculoskeletal: She exhibits no edema  and no tenderness.  Range of motion and mobility somewhat restricted D2 knee pain and back pain and her kyphosis.  Lymphadenopathy:    She has no cervical adenopathy.  Neurological: She is alert and oriented to person, place, and time. She has normal reflexes. No cranial nerve deficit.  Skin: Skin is warm and dry.  Psychiatric: She has a normal mood and affect. Her behavior is normal. Judgment and thought content normal.  She has a positive affect   BP 134/74  Pulse 77  Temp(Src) 98.4 F (36.9 C) (Oral)  Ht 5' (1.524 m)  Wt 177 lb (80.287 kg)  BMI 34.57 kg/m2        Assessment & Plan:   1. CAD - POCT CBC  2. DYSLIPIDEMIA - POCT CBC - NMR, lipoprofile  3. GERD - POCT CBC  4. HYPERTENSION - POCT CBC - BMP8+EGFR - Hepatic function panel  5. Diabetes - POCT CBC - POCT glycosylated hemoglobin (Hb A1C)  6. Vitamin D deficiency - Vit D  25 hydroxy (rtn osteoporosis monitoring)  7. Urgency incontinence - POCT UA - Microscopic Only - POCT urinalysis dipstick  8. OSTEOARTHRITIS  9. Low back pain No orders of the defined types were placed in this encounter.   Patient Instructions  Continue current medications. Continue good therapeutic lifestyle changes which include good diet and exercise. Fall precautions discussed with patient. Schedule your flu vaccine if you haven't had it yet If you are over 10 years old - you may need Prevnar 13 or the adult Pneumonia vaccine. Walk as much as possible The Prevnar vaccine may make your arm sore Will schedule you for a future pelvic exam, mammogram, and DEXA scan.   Nyra Capes MD

## 2013-08-24 NOTE — Addendum Note (Signed)
Addended by: Ernestina Penna on: 08/24/2013 01:45 PM   Modules accepted: Orders

## 2013-08-24 NOTE — Patient Instructions (Addendum)
Continue current medications. Continue good therapeutic lifestyle changes which include good diet and exercise. Fall precautions discussed with patient. Schedule your flu vaccine if you haven't had it yet If you are over 77 years old - you may need Prevnar 13 or the adult Pneumonia vaccine. Walk as much as possible The Prevnar vaccine may make your arm sore Will schedule you for a future pelvic exam, mammogram, and DEXA scan.

## 2013-08-26 DIAGNOSIS — I2510 Atherosclerotic heart disease of native coronary artery without angina pectoris: Secondary | ICD-10-CM | POA: Diagnosis not present

## 2013-08-26 DIAGNOSIS — E785 Hyperlipidemia, unspecified: Secondary | ICD-10-CM | POA: Diagnosis not present

## 2013-08-26 DIAGNOSIS — N3941 Urge incontinence: Secondary | ICD-10-CM | POA: Diagnosis not present

## 2013-08-26 DIAGNOSIS — M1990 Unspecified osteoarthritis, unspecified site: Secondary | ICD-10-CM | POA: Diagnosis not present

## 2013-08-26 DIAGNOSIS — E119 Type 2 diabetes mellitus without complications: Secondary | ICD-10-CM | POA: Diagnosis not present

## 2013-08-26 DIAGNOSIS — K219 Gastro-esophageal reflux disease without esophagitis: Secondary | ICD-10-CM | POA: Diagnosis not present

## 2013-08-26 DIAGNOSIS — E559 Vitamin D deficiency, unspecified: Secondary | ICD-10-CM | POA: Diagnosis not present

## 2013-08-26 DIAGNOSIS — I10 Essential (primary) hypertension: Secondary | ICD-10-CM | POA: Diagnosis not present

## 2013-08-26 LAB — HEPATIC FUNCTION PANEL
ALT: 13 IU/L (ref 0–32)
AST: 17 IU/L (ref 0–40)
Albumin: 4.4 g/dL (ref 3.5–4.7)
Alkaline Phosphatase: 70 IU/L (ref 39–117)
Bilirubin, Direct: 0.09 mg/dL (ref 0.00–0.40)
Total Bilirubin: 0.3 mg/dL (ref 0.0–1.2)
Total Protein: 6.8 g/dL (ref 6.0–8.5)

## 2013-08-26 LAB — BMP8+EGFR
BUN/Creatinine Ratio: 20 (ref 11–26)
BUN: 16 mg/dL (ref 8–27)
CO2: 25 mmol/L (ref 18–29)
Calcium: 10.1 mg/dL (ref 8.6–10.2)
Chloride: 96 mmol/L — ABNORMAL LOW (ref 97–108)
Creatinine, Ser: 0.82 mg/dL (ref 0.57–1.00)
GFR calc Af Amer: 77 mL/min/{1.73_m2} (ref >59)
GFR calc non Af Amer: 67 mL/min/{1.73_m2} (ref >59)
Glucose: 112 mg/dL — ABNORMAL HIGH (ref 65–99)
Potassium: 4.7 mmol/L (ref 3.5–5.2)
Sodium: 137 mmol/L (ref 134–144)

## 2013-08-26 LAB — VITAMIN D 25 HYDROXY (VIT D DEFICIENCY, FRACTURES): Vit D, 25-Hydroxy: 32.1 ng/mL (ref 30.0–100.0)

## 2013-08-26 NOTE — Addendum Note (Signed)
Addended by: Pollyann Kennedy F on: 08/26/2013 10:29 AM   Modules accepted: Orders

## 2013-08-28 LAB — NMR, LIPOPROFILE
CHOLESTEROL: 236 mg/dL — AB (ref <200)
HDL CHOLESTEROL BY NMR: 46 mg/dL (ref >=40)
HDL PARTICLE NUMBER: 33.2 umol/L (ref >=30.5)
LDL PARTICLE NUMBER: 2002 nmol/L — AB (ref <1000)
LDL Size: 19.6 nm — ABNORMAL LOW (ref >20.5)
LDLC SERPL CALC-MCNC: 131 mg/dL — AB (ref <100)
LP-IR SCORE: 74 — AB (ref <=45)
Small LDL Particle Number: 1543 nmol/L — ABNORMAL HIGH (ref <=527)
Triglycerides by NMR: 297 mg/dL — ABNORMAL HIGH (ref <150)

## 2013-08-30 DIAGNOSIS — M48061 Spinal stenosis, lumbar region without neurogenic claudication: Secondary | ICD-10-CM | POA: Diagnosis not present

## 2013-08-30 DIAGNOSIS — M5380 Other specified dorsopathies, site unspecified: Secondary | ICD-10-CM | POA: Diagnosis not present

## 2013-08-30 DIAGNOSIS — M47817 Spondylosis without myelopathy or radiculopathy, lumbosacral region: Secondary | ICD-10-CM | POA: Diagnosis not present

## 2013-08-30 DIAGNOSIS — M1990 Unspecified osteoarthritis, unspecified site: Secondary | ICD-10-CM | POA: Diagnosis not present

## 2013-09-07 ENCOUNTER — Other Ambulatory Visit: Payer: Self-pay | Admitting: Family Medicine

## 2013-09-14 ENCOUNTER — Other Ambulatory Visit: Payer: Self-pay | Admitting: Family Medicine

## 2013-09-23 ENCOUNTER — Ambulatory Visit (INDEPENDENT_AMBULATORY_CARE_PROVIDER_SITE_OTHER): Payer: Medicare Other | Admitting: Family Medicine

## 2013-09-23 VITALS — BP 114/69 | HR 78 | Temp 98.8°F | Ht 60.0 in | Wt 170.0 lb

## 2013-09-23 DIAGNOSIS — J069 Acute upper respiratory infection, unspecified: Secondary | ICD-10-CM | POA: Diagnosis not present

## 2013-09-23 MED ORDER — AZITHROMYCIN 250 MG PO TABS: ORAL_TABLET | ORAL | Status: DC

## 2013-09-23 NOTE — Progress Notes (Signed)
   Subjective:    Patient ID: Debra Phillips, female    DOB: [birth year], 80 y.o.   MRN: [id number]  HPI This 80 y.o. female presents for evaluation of uri sx's for over a week.   Review of Systems C/o uri sx's No chest pain, SOB, HA, dizziness, vision change, N/V, diarrhea, constipation, dysuria, urinary urgency or frequency, myalgias, arthralgias or rash.     Objective:   Physical Exam Vital signs noted  Well developed well nourished female.  HEENT - Head atraumatic Normocephalic                Eyes - PERRLA, Conjuctiva - clear Sclera- Clear EOMI                Ears - EAC's Wnl TM's Wnl Gross Hearing WNL                Throat - oropharanx wnl Respiratory - Lungs CTA bilateral Cardiac - RRR S1 and S2 without murmur GI - Abdomen soft Nontender and bowel sounds active x 4        Assessment & Plan:  URI (upper respiratory infection) - Plan: azithromycin (ZITHROMAX) 250 MG tablet Push po fluids, rest, tylenol and motrin otc prn as directed for fever, arthralgias, and myalgias.  Follow up prn if sx's continue or persist.  Lysbeth Penner FNP

## 2013-09-27 DIAGNOSIS — K219 Gastro-esophageal reflux disease without esophagitis: Secondary | ICD-10-CM | POA: Diagnosis not present

## 2013-09-27 DIAGNOSIS — R1310 Dysphagia, unspecified: Secondary | ICD-10-CM | POA: Diagnosis not present

## 2013-09-27 DIAGNOSIS — R112 Nausea with vomiting, unspecified: Secondary | ICD-10-CM | POA: Diagnosis not present

## 2013-10-11 ENCOUNTER — Other Ambulatory Visit: Payer: Medicare Other | Admitting: General Practice

## 2013-10-18 ENCOUNTER — Other Ambulatory Visit: Payer: Self-pay | Admitting: Family Medicine

## 2013-10-20 ENCOUNTER — Other Ambulatory Visit: Payer: Self-pay | Admitting: Family Medicine

## 2013-10-24 DIAGNOSIS — R1013 Epigastric pain: Secondary | ICD-10-CM | POA: Diagnosis not present

## 2013-10-24 DIAGNOSIS — K219 Gastro-esophageal reflux disease without esophagitis: Secondary | ICD-10-CM | POA: Diagnosis not present

## 2013-10-28 DIAGNOSIS — R1013 Epigastric pain: Secondary | ICD-10-CM | POA: Diagnosis not present

## 2013-10-31 ENCOUNTER — Encounter: Payer: Self-pay | Admitting: General Practice

## 2013-10-31 ENCOUNTER — Ambulatory Visit (INDEPENDENT_AMBULATORY_CARE_PROVIDER_SITE_OTHER): Payer: Medicare Other | Admitting: General Practice

## 2013-10-31 VITALS — BP 110/67 | HR 74 | Temp 98.2°F

## 2013-10-31 DIAGNOSIS — Z124 Encounter for screening for malignant neoplasm of cervix: Secondary | ICD-10-CM | POA: Diagnosis not present

## 2013-10-31 DIAGNOSIS — Z01419 Encounter for gynecological examination (general) (routine) without abnormal findings: Secondary | ICD-10-CM

## 2013-10-31 NOTE — Progress Notes (Signed)
   Subjective:    Patient ID: Debra Phillips, female    DOB: Dec 27, 1928, 80 y.o.   MRN: [id number]  HPI Patient presents today for gyn exam. Reports having partial hysterectomy. Denies any complaints at this time. Declines manual breast exam, current on mammograms.     Review of Systems  Constitutional: Negative for fever and chills.  Respiratory: Negative for chest tightness and shortness of breath.   Cardiovascular: Negative for chest pain and palpitations.  All other systems reviewed and are negative.       Objective:   Physical Exam  Constitutional: She is oriented to person, place, and time. She appears well-developed and well-nourished.  HENT:  Head: Normocephalic and atraumatic.  Right Ear: External ear normal.  Left Ear: External ear normal.  Eyes: EOM are normal.  Neck: Normal range of motion. Neck supple. No thyromegaly present.  Cardiovascular: Normal rate, regular rhythm and normal heart sounds.   Pulmonary/Chest: Effort normal and breath sounds normal. No respiratory distress. She exhibits no tenderness.  Abdominal: Soft. Bowel sounds are normal. She exhibits no distension. There is no tenderness.  Genitourinary: Rectum normal. No labial fusion. There is no rash, tenderness, lesion or injury on the right labia. There is no rash, tenderness, lesion or injury on the left labia. Right adnexum displays no mass, no tenderness and no fullness. Left adnexum displays no mass, no tenderness and no fullness. No erythema, tenderness or bleeding around the vagina. No foreign body around the vagina. No signs of injury around the vagina. No vaginal discharge found.  Lymphadenopathy:    She has no cervical adenopathy.  Neurological: She is alert and oriented to person, place, and time.  Skin: Skin is warm and dry.  Psychiatric: She has a normal mood and affect.          Assessment & Plan:  1. Routine gynecological examination - Pap IG w/ reflex to HPV when ASC-U -RTO prn  and scheduled appointment -Patient verbalized understanding Erby Pian, FNP-C

## 2013-10-31 NOTE — Patient Instructions (Signed)

## 2013-11-01 ENCOUNTER — Encounter: Payer: Self-pay | Admitting: General Practice

## 2013-11-01 LAB — PAP IG W/ RFLX HPV ASCU: PAP Smear Comment: 0

## 2013-11-03 DIAGNOSIS — M1990 Unspecified osteoarthritis, unspecified site: Secondary | ICD-10-CM | POA: Diagnosis not present

## 2013-11-03 DIAGNOSIS — M47817 Spondylosis without myelopathy or radiculopathy, lumbosacral region: Secondary | ICD-10-CM | POA: Diagnosis not present

## 2013-11-03 DIAGNOSIS — M48061 Spinal stenosis, lumbar region without neurogenic claudication: Secondary | ICD-10-CM | POA: Diagnosis not present

## 2013-11-11 DIAGNOSIS — K449 Diaphragmatic hernia without obstruction or gangrene: Secondary | ICD-10-CM | POA: Diagnosis not present

## 2013-11-11 DIAGNOSIS — R1013 Epigastric pain: Secondary | ICD-10-CM | POA: Diagnosis not present

## 2013-11-11 DIAGNOSIS — K219 Gastro-esophageal reflux disease without esophagitis: Secondary | ICD-10-CM | POA: Diagnosis not present

## 2013-11-11 DIAGNOSIS — K222 Esophageal obstruction: Secondary | ICD-10-CM | POA: Diagnosis not present

## 2013-11-14 DIAGNOSIS — H1045 Other chronic allergic conjunctivitis: Secondary | ICD-10-CM | POA: Diagnosis not present

## 2013-12-09 NOTE — Telephone Encounter (Signed)
Seeing internal medicine specialist and he has her on a different stomach medication is seeing him again in May,

## 2013-12-10 ENCOUNTER — Other Ambulatory Visit: Payer: Self-pay | Admitting: Family Medicine

## 2013-12-13 NOTE — Telephone Encounter (Signed)
Last seen 10/31/13  Debra Phillips  Last glucose 08/24/13

## 2013-12-19 ENCOUNTER — Encounter: Payer: Self-pay | Admitting: Family Medicine

## 2013-12-19 ENCOUNTER — Ambulatory Visit (INDEPENDENT_AMBULATORY_CARE_PROVIDER_SITE_OTHER): Payer: Medicare Other | Admitting: Family Medicine

## 2013-12-19 VITALS — BP 126/78 | HR 62 | Temp 97.6°F | Ht 60.0 in | Wt 181.0 lb

## 2013-12-19 DIAGNOSIS — I2510 Atherosclerotic heart disease of native coronary artery without angina pectoris: Secondary | ICD-10-CM

## 2013-12-19 DIAGNOSIS — K219 Gastro-esophageal reflux disease without esophagitis: Secondary | ICD-10-CM

## 2013-12-19 DIAGNOSIS — E8881 Metabolic syndrome: Secondary | ICD-10-CM

## 2013-12-19 DIAGNOSIS — W57XXXA Bitten or stung by nonvenomous insect and other nonvenomous arthropods, initial encounter: Secondary | ICD-10-CM

## 2013-12-19 DIAGNOSIS — Z13820 Encounter for screening for osteoporosis: Secondary | ICD-10-CM

## 2013-12-19 DIAGNOSIS — E559 Vitamin D deficiency, unspecified: Secondary | ICD-10-CM | POA: Diagnosis not present

## 2013-12-19 DIAGNOSIS — S30861A Insect bite (nonvenomous) of abdominal wall, initial encounter: Secondary | ICD-10-CM

## 2013-12-19 DIAGNOSIS — E785 Hyperlipidemia, unspecified: Secondary | ICD-10-CM | POA: Diagnosis not present

## 2013-12-19 DIAGNOSIS — S30860A Insect bite (nonvenomous) of lower back and pelvis, initial encounter: Secondary | ICD-10-CM

## 2013-12-19 DIAGNOSIS — I10 Essential (primary) hypertension: Secondary | ICD-10-CM

## 2013-12-19 LAB — POCT CBC
Granulocyte percent: 57 %G (ref 37–80)
HCT, POC: 40.4 % (ref 37.7–47.9)
Hemoglobin: 12.8 g/dL (ref 12.2–16.2)
LYMPH, POC: 2.4 (ref 0.6–3.4)
MCH, POC: 28.6 pg (ref 27–31.2)
MCHC: 31.7 g/dL — AB (ref 31.8–35.4)
MCV: 90.2 fL (ref 80–97)
MPV: 6.8 fL (ref 0–99.8)
PLATELET COUNT, POC: 335 10*3/uL (ref 142–424)
POC Granulocyte: 4 (ref 2–6.9)
POC LYMPH PERCENT: 34.7 %L (ref 10–50)
RBC: 4.5 M/uL (ref 4.04–5.48)
RDW, POC: 13.9 %
WBC: 7 10*3/uL (ref 4.6–10.2)

## 2013-12-19 LAB — POCT GLYCOSYLATED HEMOGLOBIN (HGB A1C): Hemoglobin A1C: 6.3

## 2013-12-19 MED ORDER — AMOXICILLIN 500 MG PO CAPS: 500.0000 mg | ORAL_CAPSULE | Freq: Three times a day (TID) | ORAL | Status: DC

## 2013-12-19 NOTE — Patient Instructions (Addendum)
Medicare Annual Wellness Visit  [facility] and the medical providers at [facility] strive to bring you the best medical care.  In doing so we not only want to address your current medical conditions and concerns but also to detect new conditions early and prevent illness, disease and health-related problems.    Medicare offers a yearly Wellness Visit which allows our clinical staff to assess your need for preventative services including immunizations, lifestyle education, counseling to decrease risk of preventable diseases and screening for fall risk and other medical concerns.    This visit is provided free of charge (no copay) for all Medicare recipients. The clinical pharmacists at [facility] have begun to conduct these Wellness Visits which will also include a thorough review of all your medications.    As you primary medical provider recommend that you make an appointment for your Annual Wellness Visit if you have not done so already this year.  You may set up this appointment before you leave today or you may call back ([phone]) and schedule an appointment.  Please make sure when you call that you mention that you are scheduling your Annual Wellness Visit with the clinical pharmacist so that the appointment may be made for the proper length of time.      Continue current medications. Continue good therapeutic lifestyle changes which include good diet and exercise. Fall precautions discussed with patient. If an FOBT was given today- please return it to our front desk. If you are over 81 years old - you may need Prevnar 58 or the adult Pneumonia vaccine.  Continue to monitor blood sugars at home Exercise but being cautious not to fall Continue to drink plenty of fluids  Monitor body closely for ticks

## 2013-12-19 NOTE — Progress Notes (Signed)
Subjective:    Patient ID: Debra Phillips, female    DOB: 03-Jul-1929, 80 y.o.   MRN: [id number]  HPI Pt here for follow up and management of chronic medical problems. The patient today complains of fatigue, arthralgias and a recent tick bite. She also has had some congestion. She is due for a mammogram, DEXA scan. She'll get a shingle shot today as a cause is minimal. He will have her lab work and will be given and FOBT to return.          Patient Active Problem List   Diagnosis Date Noted  . Precordial pain 02/16/2012  . PALPITATIONS 02/27/2010  . CAD 12/12/2009  . Aortic aneurysm of unspecified site without mention of rupture 06/27/2009  . DYSPNEA 06/27/2009  . DYSLIPIDEMIA 12/12/2008  . HYPERTENSION 12/12/2008  . GERD 12/12/2008  . OSTEOARTHRITIS 12/12/2008   Outpatient Encounter Prescriptions as of 12/19/2013  Medication Sig  . aspirin 81 MG tablet Take 81 mg by mouth daily.    . calcium carbonate (OS-CAL) 600 MG TABS Take 600 mg by mouth daily.    . Cholecalciferol 1000 UNITS tablet Take 2,000 Units by mouth daily.    . Cinnamon 500 MG capsule Take 1,000 mg by mouth daily.    . citalopram (CELEXA) 20 MG tablet TAKE 1 TABLET BY MOUTH ONCE DAILY AS INSTRUCTED  . diltiazem (CARDIZEM CD) 180 MG 24 hr capsule TAKE ONE CAPSULE BY MOUTH ONE TIME DAILY  . Lancets (ONETOUCH ULTRASOFT) lancets Use to check blood sugar once daily and prn  . lisinopril-hydrochlorothiazide (PRINZIDE,ZESTORETIC) 20-12.5 MG per tablet TAKE ONE TABLET BY MOUTH ONE TIME DAILY  . metFORMIN (GLUCOPHAGE) 500 MG tablet TAKE ONE TABLET BY MOUTH TWICE DAILY  . multivitamin (THERAGRAN) per tablet Take 1 tablet by mouth daily.    . Omega-3 Fatty Acids (FISH OIL) 1000 MG CAPS Take 2,000 mg by mouth daily.    Marland Kitchen omeprazole (PRILOSEC) 40 MG capsule Take 40 mg by mouth daily.  . ONE TOUCH ULTRA TEST test strip USE TO CHECK BLOOD GLUCOSE ONCE DAILY  . traMADol (ULTRAM) 50 MG tablet Take 50 mg by mouth as needed.     . [DISCONTINUED] ascorbic acid (VITAMIN C) 500 MG tablet Take 500 mg by mouth daily.   . furosemide (LASIX) 20 MG tablet 2 pills the first day then one pill daily thereafter for fluid  . [DISCONTINUED] diltiazem (DILACOR XR) 180 MG 24 hr capsule Take 180 mg by mouth daily.    . [DISCONTINUED] rosuvastatin (CRESTOR) 5 MG tablet Take 5 mg by mouth daily.      Review of Systems  Constitutional: Positive for fatigue (sleepy).  HENT: Positive for congestion.   Eyes: Negative.   Respiratory: Negative.   Cardiovascular: Negative.   Gastrointestinal: Negative.   Endocrine: Negative.   Genitourinary: Negative.   Musculoskeletal: Positive for arthralgias.  Skin: Negative.        Tick bite to right rib area  Allergic/Immunologic: Negative.   Neurological: Negative.   Hematological: Negative.   Psychiatric/Behavioral: Negative.        Objective:   Physical Exam  Nursing note and vitals reviewed. Constitutional: She is oriented to person, place, and time. She appears well-developed and well-nourished. No distress.  HENT:  Head: Normocephalic and atraumatic.  Right Ear: External ear normal.  Left Ear: External ear normal.  Mouth/Throat: Oropharynx is clear and moist.  Nasal congestion bilaterally  Eyes: Conjunctivae and EOM are normal. Pupils are equal, round, and reactive  to light. Right eye exhibits no discharge. Left eye exhibits no discharge. No scleral icterus.  Neck: Normal range of motion. Neck supple. No thyromegaly present.  Cardiovascular: Normal rate, regular rhythm, normal heart sounds and intact distal pulses.  Exam reveals no gallop and no friction rub.   No murmur heard. At 60 per minute  Pulmonary/Chest: Effort normal and breath sounds normal. No respiratory distress. She has no wheezes. She has no rales.  Axillary negative  Abdominal: Soft. Bowel sounds are normal. She exhibits no mass. There is no tenderness. There is no rebound and no guarding.   Generalized  abdominal tenderness upper and lower abdomen  Genitourinary:  Both breasts were checked and axillary regions were negative. There were no lumps or masses.  Musculoskeletal: Normal range of motion. She exhibits no edema and no tenderness.  The patient is slow to move due to 2 back pain and lower extremity joint pain.  Lymphadenopathy:    She has no cervical adenopathy.  Neurological: She is alert and oriented to person, place, and time. She has normal reflexes. No cranial nerve deficit.  Skin: Skin is warm and dry. Rash noted. There is erythema.  There was an area of erythema on the right flank area. There was a small speck in the center which might have been the residual of a piece of the tick bite she removed. This area was cleansed and this was carefully removed with a pair of tweezers.  Psychiatric: She has a normal mood and affect. Her behavior is normal. Judgment and thought content normal.   BP 126/78  Pulse 62  Temp(Src) 97.6 F (36.4 C) (Oral)  Ht 5' (1.524 m)  Wt 181 lb (82.101 kg)  BMI 35.35 kg/m2        Assessment & Plan:  1. CAD - POCT CBC - BMP8+EGFR - Hepatic function panel  2. DYSLIPIDEMIA - POCT CBC - NMR, lipoprofile  3. GERD - POCT CBC - Hepatic function panel -Continue followup with gastroenterologist, Dr. Glennon Hamilton  4. HYPERTENSION - POCT CBC - BMP8+EGFR - Hepatic function panel  5. Metabolic syndrome - POCT glycosylated hemoglobin (Hb A1C) - POCT CBC  6. Vitamin D deficiency - POCT CBC - Vit D  25 hydroxy (rtn osteoporosis monitoring)  7. Osteoporosis screening - DG Bone Density; Future  8. Tick bite of abdomen - amoxicillin (AMOXIL) 500 MG capsule; Take 1 capsule (500 mg total) by mouth 3 (three) times daily.  Dispense: 42 capsule; Refill: 0 -Tick remnant was removed with tweezers after thorough cleansing  Patient Instructions                       Medicare Annual Wellness Visit  [facility] and the medical providers at [facility] strive to bring you the best medical care.  In doing so we not only want to address your current medical conditions and concerns but also to detect new conditions early and prevent illness, disease and health-related problems.    Medicare offers a yearly Wellness Visit which allows our clinical staff to assess your need for preventative services including immunizations, lifestyle education, counseling to decrease risk of preventable diseases and screening for fall risk and other medical concerns.    This visit is provided free of charge (no copay) for all Medicare recipients. The clinical pharmacists at [facility] have begun to conduct these Wellness Visits which will also include a thorough review of all your medications.  As you primary medical provider recommend that you make an appointment for your Annual Wellness Visit if you have not done so already this year.  You may set up this appointment before you leave today or you may call back ([phone]) and schedule an appointment.  Please make sure when you call that you mention that you are scheduling your Annual Wellness Visit with the clinical pharmacist so that the appointment may be made for the proper length of time.      Continue current medications. Continue good therapeutic lifestyle changes which include good diet and exercise. Fall precautions discussed with patient. If an FOBT was given today- please return it to our front desk. If you are over 48 years old - you may need Prevnar 17 or the adult Pneumonia vaccine.  Continue to monitor blood sugars at home Exercise but being cautious not to fall Continue to drink plenty of fluids  Monitor body closely for ticks   Arrie Senate MD

## 2013-12-20 LAB — HEPATIC FUNCTION PANEL
ALK PHOS: 62 IU/L (ref 39–117)
ALT: 14 IU/L (ref 0–32)
AST: 17 IU/L (ref 0–40)
Albumin: 4.3 g/dL (ref 3.5–4.7)
Bilirubin, Direct: 0.12 mg/dL (ref 0.00–0.40)
Total Bilirubin: 0.4 mg/dL (ref 0.0–1.2)
Total Protein: 6.6 g/dL (ref 6.0–8.5)

## 2013-12-20 LAB — BMP8+EGFR
BUN/Creatinine Ratio: 18 (ref 11–26)
BUN: 13 mg/dL (ref 8–27)
CHLORIDE: 98 mmol/L (ref 97–108)
CO2: 27 mmol/L (ref 18–29)
Calcium: 9.6 mg/dL (ref 8.7–10.3)
Creatinine, Ser: 0.73 mg/dL (ref 0.57–1.00)
GFR calc non Af Amer: 76 mL/min/{1.73_m2} (ref >59)
GFR, EST AFRICAN AMERICAN: 88 mL/min/{1.73_m2} (ref >59)
Glucose: 148 mg/dL — ABNORMAL HIGH (ref 65–99)
Potassium: 4.7 mmol/L (ref 3.5–5.2)
SODIUM: 140 mmol/L (ref 134–144)

## 2013-12-20 LAB — NMR, LIPOPROFILE
Cholesterol: 223 mg/dL — ABNORMAL HIGH (ref <200)
HDL CHOLESTEROL BY NMR: 34 mg/dL — AB (ref >=40)
HDL Particle Number: 27.9 umol/L — ABNORMAL LOW (ref >=30.5)
LDL Particle Number: 1854 nmol/L — ABNORMAL HIGH (ref <1000)
LDL Size: 19.6 nm (ref >20.5)
LDLC SERPL CALC-MCNC: 117 mg/dL — ABNORMAL HIGH (ref <100)
LP-IR SCORE: 64 — AB (ref <=45)
SMALL LDL PARTICLE NUMBER: 1161 nmol/L — AB (ref <=527)
Triglycerides by NMR: 362 mg/dL — ABNORMAL HIGH (ref <150)

## 2013-12-20 LAB — VITAMIN D 25 HYDROXY (VIT D DEFICIENCY, FRACTURES): Vit D, 25-Hydroxy: 36.1 ng/mL (ref 30.0–100.0)

## 2013-12-21 ENCOUNTER — Telehealth: Payer: Self-pay | Admitting: Family Medicine

## 2013-12-21 NOTE — Telephone Encounter (Signed)
Message copied by Waverly Ferrari on Wed Dec 21, 2013 11:21 AM ------      Message from: Chipper Herb      Created: Tue Dec 20, 2013  6:15 PM       The blood sugar is elevated at 148. The creatinine, the most important kidney function test is within normal limits. The electrolytes including potassium are within normal limits.      All liver function tests are within normal limits      On advanced lipid testing, the total LDL particle number is elevated at 1854. This is slightly decreased from what it was 3 months ago. The LDL C. remains elevated at 117. The triglycerides are also elevated. The HDL particle number, the good cholesterol is low.=== Please find out from patient while she is not taking a statin drug??? Make sure that she is following her diet and getting as much exercise as possible       The vitamin D level is at the low end of the normal range.----------------- increased vitamin D3 to 2000 daily ------

## 2013-12-21 NOTE — Telephone Encounter (Signed)
Pt is aware of lab results.  Per pt she takes fish oil  And is not taking a statin.  She does not want to take one, she will cut back on sweets and watch diet.  Call her if you have questions.

## 2013-12-28 ENCOUNTER — Other Ambulatory Visit: Payer: Medicare Other

## 2013-12-28 DIAGNOSIS — Z1212 Encounter for screening for malignant neoplasm of rectum: Secondary | ICD-10-CM | POA: Diagnosis not present

## 2013-12-29 DIAGNOSIS — R1310 Dysphagia, unspecified: Secondary | ICD-10-CM | POA: Diagnosis not present

## 2013-12-29 DIAGNOSIS — K222 Esophageal obstruction: Secondary | ICD-10-CM | POA: Diagnosis not present

## 2013-12-30 LAB — FECAL OCCULT BLOOD, IMMUNOCHEMICAL: FECAL OCCULT BLD: NEGATIVE

## 2014-01-13 ENCOUNTER — Other Ambulatory Visit: Payer: Self-pay | Admitting: General Practice

## 2014-01-18 ENCOUNTER — Other Ambulatory Visit: Payer: Self-pay | Admitting: Family Medicine

## 2014-02-13 ENCOUNTER — Ambulatory Visit (INDEPENDENT_AMBULATORY_CARE_PROVIDER_SITE_OTHER): Payer: Medicare Other | Admitting: Family

## 2014-02-13 ENCOUNTER — Encounter: Payer: Self-pay | Admitting: Family

## 2014-02-13 ENCOUNTER — Other Ambulatory Visit: Payer: Self-pay | Admitting: General Practice

## 2014-02-13 ENCOUNTER — Other Ambulatory Visit: Payer: Self-pay | Admitting: Family Medicine

## 2014-02-13 VITALS — BP 133/79 | HR 72 | Temp 96.8°F | Ht 60.0 in | Wt 182.0 lb

## 2014-02-13 DIAGNOSIS — I2510 Atherosclerotic heart disease of native coronary artery without angina pectoris: Secondary | ICD-10-CM | POA: Diagnosis not present

## 2014-02-13 DIAGNOSIS — R300 Dysuria: Secondary | ICD-10-CM

## 2014-02-13 DIAGNOSIS — N390 Urinary tract infection, site not specified: Secondary | ICD-10-CM

## 2014-02-13 LAB — POCT URINALYSIS DIPSTICK
Bilirubin, UA: NEGATIVE
Blood, UA: NEGATIVE
GLUCOSE UA: NEGATIVE
Ketones, UA: NEGATIVE
NITRITE UA: NEGATIVE
PH UA: 6.5
Protein, UA: NEGATIVE
Spec Grav, UA: 1.01
UROBILINOGEN UA: NEGATIVE

## 2014-02-13 LAB — POCT UA - MICROSCOPIC ONLY
CASTS, UR, LPF, POC: NEGATIVE
Crystals, Ur, HPF, POC: NEGATIVE
Mucus, UA: NEGATIVE
RBC, urine, microscopic: NEGATIVE
YEAST UA: NEGATIVE

## 2014-02-13 MED ORDER — NITROFURANTOIN MONOHYD MACRO 100 MG PO CAPS: 100.0000 mg | ORAL_CAPSULE | Freq: Two times a day (BID) | ORAL | Status: DC

## 2014-02-13 NOTE — Patient Instructions (Signed)

## 2014-02-13 NOTE — Progress Notes (Signed)
   Subjective:    Patient ID: Debra Phillips, female    DOB: 10-15-1928, 80 y.o.   MRN: [id number]  Urinary Tract Infection  This is a new problem. The current episode started 1 to 4 weeks ago ("several weeks"). The problem has been gradually worsening. The quality of the pain is described as aching. The pain is at a severity of 9/10. The pain is moderate. There has been no fever. Associated symptoms include flank pain, frequency, hesitancy and urgency. Pertinent negatives include no chills, discharge, hematuria or nausea. The treatment provided mild relief. There is no history of kidney stones.      Review of Systems  Constitutional: Negative for chills.  HENT: Negative.   Respiratory: Negative.   Cardiovascular: Negative.   Gastrointestinal: Negative.  Negative for nausea.  Genitourinary: Positive for hesitancy, urgency, frequency and flank pain. Negative for hematuria.  All other systems reviewed and are negative.      Objective:   Physical Exam  Vitals reviewed. Constitutional: She is oriented to person, place, and time. She appears well-developed and well-nourished. No distress.  Cardiovascular: Normal rate, regular rhythm, normal heart sounds and intact distal pulses.   No murmur heard. Pulmonary/Chest: Effort normal and breath sounds normal. No respiratory distress. She has no wheezes.  Abdominal: Soft. Bowel sounds are normal. She exhibits no distension. There is no tenderness.  Musculoskeletal: Normal range of motion. She exhibits no edema and no tenderness.  Negative for CVA  Neurological: She is alert and oriented to person, place, and time.  Skin: Skin is warm and dry.  Psychiatric: She has a normal mood and affect. Her behavior is normal. Judgment and thought content normal.     BP 133/79  Pulse 72  Temp(Src) 96.8 F (36 C) (Oral)  Ht 5' (1.524 m)  Wt 182 lb (82.555 kg)  BMI 35.54 kg/m2  SpO2 98%   Results for orders placed in visit on 02/13/14  POCT UA  - MICROSCOPIC ONLY      Result Value Ref Range   WBC, Ur, HPF, POC 15-20     RBC, urine, microscopic neg     Bacteria, U Microscopic many     Mucus, UA neg     Epithelial cells, urine per micros occ     Crystals, Ur, HPF, POC neg     Casts, Ur, LPF, POC neg     Yeast, UA neg    POCT URINALYSIS DIPSTICK      Result Value Ref Range   Color, UA yellow     Clarity, UA cloudy     Glucose, UA neg     Bilirubin, UA neg     Ketones, UA neg     Spec Grav, UA 1.010     Blood, UA neg     pH, UA 6.5     Protein, UA neg     Urobilinogen, UA negative     Nitrite, UA neg     Leukocytes, UA moderate (2+)         Assessment & Plan:  1. Dysuria - POCT UA - Microscopic Only - POCT urinalysis dipstick  2. Urinary tract infection without hematuria, site unspecified -Force fluids AZO over the counter X2 days RTO prn - nitrofurantoin, macrocrystal-monohydrate, (MACROBID) 100 MG capsule; Take 1 capsule (100 mg total) by mouth 2 (two) times daily.  Dispense: 14 capsule; Refill: 0  Evelina Dun, FNP

## 2014-03-08 ENCOUNTER — Ambulatory Visit (INDEPENDENT_AMBULATORY_CARE_PROVIDER_SITE_OTHER): Payer: Medicare Other | Admitting: Pharmacist

## 2014-03-08 ENCOUNTER — Ambulatory Visit (INDEPENDENT_AMBULATORY_CARE_PROVIDER_SITE_OTHER): Payer: Medicare Other

## 2014-03-08 ENCOUNTER — Encounter: Payer: Self-pay | Admitting: Pharmacist

## 2014-03-08 VITALS — Ht 60.0 in | Wt 181.0 lb

## 2014-03-08 DIAGNOSIS — M810 Age-related osteoporosis without current pathological fracture: Secondary | ICD-10-CM | POA: Diagnosis not present

## 2014-03-08 DIAGNOSIS — Z13820 Encounter for screening for osteoporosis: Secondary | ICD-10-CM

## 2014-03-08 DIAGNOSIS — M8080XD Other osteoporosis with current pathological fracture, unspecified site, subsequent encounter for fracture with routine healing: Secondary | ICD-10-CM

## 2014-03-08 DIAGNOSIS — M8448XD Pathological fracture, other site, subsequent encounter for fracture with routine healing: Secondary | ICD-10-CM | POA: Diagnosis not present

## 2014-03-08 DIAGNOSIS — M8088XD Other osteoporosis with current pathological fracture, vertebra(e), subsequent encounter for fracture with routine healing: Secondary | ICD-10-CM | POA: Insufficient documentation

## 2014-03-08 DIAGNOSIS — Z8781 Personal history of (healed) traumatic fracture: Secondary | ICD-10-CM

## 2014-03-08 DIAGNOSIS — M4850XA Collapsed vertebra, not elsewhere classified, site unspecified, initial encounter for fracture: Secondary | ICD-10-CM | POA: Insufficient documentation

## 2014-03-08 DIAGNOSIS — M8580 Other specified disorders of bone density and structure, unspecified site: Secondary | ICD-10-CM

## 2014-03-08 LAB — HM DEXA SCAN

## 2014-03-08 NOTE — Progress Notes (Signed)
Patient ID: Debra Phillips, female   DOB: Jul 29, 1929, 80 y.o.   MRN: [id number] [facility] Current Height: Height: 5' (152.4 cm)      Max Lifetime Height:  5\' 3"  Current Weight: Weight: 181 lb (82.101 kg)       Ethnicity:Caucasian       HPI: Patient with osteoporosis and history of refusal of treatment recommendations in past Back Pain?  yes      Kyphosis?  Yes Prior fracture?  Yes - vertebral fracture w/ vetebralplasty and elbow Med(s) for Osteoporosis/Osteopenia:  None currently Med(s) previously tried for Osteoporosis/Osteopenia:  Tried Reclast by caused her to feel terrible and severe espoagitis prevents use of oral bisphosphonates                                                             PMH: Age at menopause:  54's Hysterectomy?  Yes Oophorectomy?  No HRT? Yes - Former.  Type/duration: premarin Steroid Use?  No Thyroid med?  No History of cancer?  No History of digestive disorders (ie Crohn's)?  Yes - severe esophagitis Current or previous eating disorders?  No Last Vitamin D Result:  36.1 (12/19/2013) Last GFR Result:  76 (03/20/2014)   FH/SH: Family history of osteoporosis?  Yes - maternal aunt Parent with history of hip fracture?  No Family history of breast cancer?  No Exercise?  No Smoking?  No Alcohol?  No    Calcium Assessment Calcium Intake  # of servings/day  Calcium mg  Milk (8 oz) 1  x  300  = 300mg   Yogurt (4 oz) 1 x  200 = 200mg   Cheese (1 oz) 0 x  200 = 0  Other Calcium sources   250mg   Ca supplement 1000mg  calcium + MVI = 1000mg    Estimated calcium intake per day 1750mg     DEXA Results Date of Test T-Score for AP Spine L1-L4 T-Score for Total Left Hip T-Score for Total Right Hip  03/08/2014 2.0 -1.1 -0.9  07/18/2009 0.7 -1.0 -1.0  08/05/1999 0.7 -1.1 -1.0       **lowest T-Score was -1.8 at neck of right hip**  Assessment: History of vertebral fracture with vertebralplasty Osteoporosis  Recommendations: 1.  Left message at  [facility] to check insurance coverage of Prolia 60mg  1SQ q 6 months (patient has both medicare and medicaid) 2.  recommend calcium 1200mg  daily through supplementation or diet.  3.  recommend weight bearing exercise - 30 minutes at least 4 days per week.   - only if OK with neurosurgeon.  Made referral for balance evaluation and treatment to PT 4.  Counseled and educated about fall risk and prevention. 5.  Appt for AWV made for 03/17/14  Recheck DEXA:  2 years  Time spent counseling patient:  30 minutes  Cherre Robins, PharmD, CPP

## 2014-03-08 NOTE — Patient Instructions (Addendum)
Make sure to separate Multivitamin and Calcium - take one in the morning and the other in the evening. We are checking into cost of Prolia for osteoporosis (injection that is given every 6 months)   Fall Prevention and Home Safety Falls cause injuries and can affect all age groups. It is possible to use preventive measures to significantly decrease the likelihood of falls. There are many simple measures which can make your home safer and prevent falls. OUTDOORS  Repair cracks and edges of walkways and driveways.  Remove high doorway thresholds.  Trim shrubbery on the main path into your home.  Have good outside lighting.  Clear walkways of tools, rocks, debris, and clutter.  Check that handrails are not broken and are securely fastened. Both sides of steps should have handrails.  Have leaves, snow, and ice cleared regularly.  Use sand or salt on walkways during winter months.  In the garage, clean up grease or oil spills. BATHROOM  Install night lights.  Install grab bars by the toilet and in the tub and shower.  Use non-skid mats or decals in the tub or shower.  Place a plastic non-slip stool in the shower to sit on, if needed.  Keep floors dry and clean up all water on the floor immediately.  Remove soap buildup in the tub or shower on a regular basis.  Secure bath mats with non-slip, double-sided rug tape.  Remove throw rugs and tripping hazards from the floors. BEDROOMS  Install night lights.  Make sure a bedside light is easy to reach.  Do not use oversized bedding.  Keep a telephone by your bedside.  Have a firm chair with side arms to use for getting dressed.  Remove throw rugs and tripping hazards from the floor. KITCHEN  Keep handles on pots and pans turned toward the center of the stove. Use back burners when possible.  Clean up spills quickly and allow time for drying.  Avoid walking on wet floors.  Avoid hot utensils and knives.  Position  shelves so they are not too high or low.  Place commonly used objects within easy reach.  If necessary, use a sturdy step stool with a grab bar when reaching.  Keep electrical cables out of the way.  Do not use floor polish or wax that makes floors slippery. If you must use wax, use non-skid floor wax.  Remove throw rugs and tripping hazards from the floor. STAIRWAYS  Never leave objects on stairs.  Place handrails on both sides of stairways and use them. Fix any loose handrails. Make sure handrails on both sides of the stairways are as long as the stairs.  Check carpeting to make sure it is firmly attached along stairs. Make repairs to worn or loose carpet promptly.  Avoid placing throw rugs at the top or bottom of stairways, or properly secure the rug with carpet tape to prevent slippage. Get rid of throw rugs, if possible.  Have an electrician put in a light switch at the top and bottom of the stairs. OTHER FALL PREVENTION TIPS  Wear low-heel or rubber-soled shoes that are supportive and fit well. Wear closed toe shoes.  When using a stepladder, make sure it is fully opened and both spreaders are firmly locked. Do not climb a closed stepladder.  Add color or contrast paint or tape to grab bars and handrails in your home. Place contrasting color strips on first and last steps.  Learn and use mobility aids as needed. Install an  electrical emergency response system.  Turn on lights to avoid dark areas. Replace light bulbs that burn out immediately. Get light switches that glow.  Arrange furniture to create clear pathways. Keep furniture in the same place.  Firmly attach carpet with non-skid or double-sided tape.  Eliminate uneven floor surfaces.  Select a carpet pattern that does not visually hide the edge of steps.  Be aware of all pets. OTHER HOME SAFETY TIPS  Set the water temperature for 120 F (48.8 C).  Keep emergency numbers on or near the telephone.  Keep  smoke detectors on every level of the home and near sleeping areas. Document Released: 08/01/2002 Document Revised: 02/10/2012 Document Reviewed: 10/31/2011 [facility] Patient Information 2015 [place], [place]. This information is not intended to replace advice given to you by your health care provider. Make sure you discuss any questions you have with your health care provider.                Exercise for Strong Bones  Exercise is important to build and maintain strong bones / bone density.  There are 2 types of exercises that are important to building and maintaining strong bones:  Weight- bearing and muscle-stregthening.  Weight-bearing Exercises  These exercises include activities that make you move against gravity while staying upright. Weight-bearing exercises can be high-impact or low-impact.  High-impact weight-bearing exercises help build bones and keep them strong. If you have broken a bone due to osteoporosis or are at risk of breaking a bone, you may need to avoid high-impact exercises. If you're not sure, you should check with your healthcare provider.  Examples of high-impact weight-bearing exercises are: Dancing  Doing high-impact aerobics  Hiking  Jogging/running  Jumping Rope  Stair climbing  Tennis  Low-impact weight-bearing exercises can also help keep bones strong and are a safe alternative if you cannot do high-impact exercises.   Examples of low-impact weight-bearing exercises are: Using elliptical training machines  Doing low-impact aerobics  Using stair-step machines  Fast walking on a treadmill or outside   Muscle-Strengthening Exercises These exercises include activities where you move your body, a weight or some other resistance against gravity. They are also known as resistance exercises and include: Lifting weights  Using elastic exercise bands  Using weight machines  Lifting your own body weight  Functional movements, such as standing and rising up on  your toes  Yoga and Pilates can also improve strength, balance and flexibility. However, certain positions may not be safe for people with osteoporosis or those at increased risk of broken bones. For example, exercises that have you bend forward may increase the chance of breaking a bone in the spine.   Non-Impact Exercises There are other types of exercises that can help prevent falls.  Non-impact exercises can help you to improve balance, posture and how well you move in everyday activities. Some of these exercises include: Balance exercises that strengthen your legs and test your balance, such as Tai Chi, can decrease your risk of falls.  Posture exercises that improve your posture and reduce rounded or "sloping" shoulders can help you decrease the chance of breaking a bone, especially in the spine.  Functional exercises that improve how well you move can help you with everyday activities and decrease your chance of falling and breaking a bone. For example, if you have trouble getting up from a chair or climbing stairs, you should do these activities as exercises.   **A physical therapist can teach you balance, posture and  functional exercises. He/she can also help you learn which exercises are safe and appropriate for you.  [facility] has a physical therapy office in [place] in front of our office and referrals can be made for assessments and treatment as needed and strength and balance training.  If you would like to have an assessment with [facility] and our physical therapy team please let a nurse or provider know.

## 2014-03-14 DIAGNOSIS — K219 Gastro-esophageal reflux disease without esophagitis: Secondary | ICD-10-CM | POA: Diagnosis not present

## 2014-03-16 DIAGNOSIS — Z1231 Encounter for screening mammogram for malignant neoplasm of breast: Secondary | ICD-10-CM | POA: Diagnosis not present

## 2014-03-17 ENCOUNTER — Encounter: Payer: Self-pay | Admitting: Pharmacist

## 2014-03-17 ENCOUNTER — Ambulatory Visit (INDEPENDENT_AMBULATORY_CARE_PROVIDER_SITE_OTHER): Payer: Medicare Other | Admitting: Pharmacist

## 2014-03-17 VITALS — BP 124/70 | HR 74 | Ht 60.0 in | Wt 181.5 lb

## 2014-03-17 DIAGNOSIS — Z0000 Encounter for general adult medical examination without abnormal findings: Secondary | ICD-10-CM | POA: Diagnosis not present

## 2014-03-17 DIAGNOSIS — Z23 Encounter for immunization: Secondary | ICD-10-CM

## 2014-03-17 NOTE — Patient Instructions (Signed)
Health Maintenance Summary    FOOT EXAM Done today       ZOSTAVAX Completed 03/17/14      INFLUENZA VACCINE Next Due 03/25/2014  last 11/02/13    URINE MICROALBUMIN Next Due 05/04/2014  last 05/04/13   Mammogram Next due 03/17/2015 Last was 03/16/2014   Dexa Next due  02/2016 Last was 03/08/2014    HEMOGLOBIN A1C Next Due 06/20/2014  last was 6.3% on 11/2013    OPHTHALMOLOGY EXAM Next Due 07/08/2014  last was 06/2013    TETANUS/TDAP Next Due 08/26/2015  last was 08/25/2005    COLONOSCOPY Next Due 11/23/2021  last was 11/24/2011       Pick up Prolia  (injection for bones / osteoporosis) at [facility] A healthy lifestyle and preventive care can promote health and wellness. Preventive health guidelines for women include the following key practices.  A routine yearly physical is a good way to check with your health care provider about your health and preventive screening. It is a chance to share any concerns and updates on your health and to receive a thorough exam.  Visit your dentist for a routine exam and preventive care every 6 months. Brush your teeth twice a day and floss once a day. Good oral hygiene prevents tooth decay and gum disease.  The frequency of eye exams is based on your age, health, family medical history, use of contact lenses, and other factors. Follow your health care provider's recommendations for frequency of eye exams.  Eat a healthy diet. Foods like vegetables, fruits, whole grains, low-fat dairy products, and lean protein foods contain the nutrients you need without too many calories. Decrease your intake of foods high in solid fats, added sugars, and salt. Eat the right amount of calories for you.Get information about a proper diet from your health care provider, if necessary.  Regular physical exercise is one of the most important things you can do for your health. Most adults should get at least 150 minutes of moderate-intensity exercise (any activity  that increases your heart rate and causes you to sweat) each week. In addition, most adults need muscle-strengthening exercises on 2 or more days a week.  Maintain a healthy weight. The body mass index (BMI) is a screening tool to identify possible weight problems. It provides an estimate of body fat based on height and weight. Your health care provider can find your BMI and can help you achieve or maintain a healthy weight.For adults 20 years and older:  A BMI below 18.5 is considered underweight.  A BMI of 18.5 to 24.9 is normal.  A BMI of 25 to 29.9 is considered overweight.  A BMI of 30 and above is considered obese.  Maintain normal blood lipids and cholesterol levels by exercising and minimizing your intake of saturated fat. Eat a balanced diet with plenty of fruit and vegetables. Blood tests for lipids and cholesterol should begin at age 7 and be repeated every 5 years. If your lipid or cholesterol levels are high, you are over 50, or you are at high risk for heart disease, you may need your cholesterol levels checked more frequently.Ongoing high lipid and cholesterol levels should be treated with medicines if diet and exercise are not working.  If you smoke, find out from your health care provider how to quit. If you do not use tobacco, do not start.  Lung cancer screening is recommended for adults aged 63-80 years who are at high risk for developing  lung cancer because of a history of smoking. A yearly low-dose CT scan of the lungs is recommended for people who have at least a 30-pack-year history of smoking and are a current smoker or have quit within the past 15 years. A pack year of smoking is smoking an average of 1 pack of cigarettes a day for 1 year (for example: 1 pack a day for 30 years or 2 packs a day for 15 years). Yearly screening should continue until the smoker has stopped smoking for at least 15 years. Yearly screening should be stopped for people who develop a health  problem that would prevent them from having lung cancer treatment.  If you are pregnant, do not drink alcohol. If you are breastfeeding, be very cautious about drinking alcohol. If you are not pregnant and choose to drink alcohol, do not have more than 1 drink per day. One drink is considered to be 12 ounces (355 mL) of beer, 5 ounces (148 mL) of wine, or 1.5 ounces (44 mL) of liquor.  Avoid use of street drugs. Do not share needles with anyone. Ask for help if you need support or instructions about stopping the use of drugs.  High blood pressure causes heart disease and increases the risk of stroke. Your blood pressure should be checked at least every 1 to 2 years. Ongoing high blood pressure should be treated with medicines if weight loss and exercise do not work.  If you are 41-46 years old, ask your health care provider if you should take aspirin to prevent strokes.  Diabetes screening involves taking a blood sample to check your fasting blood sugar level. This should be done once every 3 years, after age 47, if you are within normal weight and without risk factors for diabetes. Testing should be considered at a younger age or be carried out more frequently if you are overweight and have at least 1 risk factor for diabetes.  Breast cancer screening is essential preventive care for women. You should practice "breast self-awareness." This means understanding the normal appearance and feel of your breasts and may include breast self-examination. Any changes detected, no matter how small, should be reported to a health care provider. Women in their 63s and 30s should have a clinical breast exam (CBE) by a health care provider as part of a regular health exam every 1 to 3 years. After age 78, women should have a CBE every year. Starting at age 24, women should consider having a mammogram (breast X-ray test) every year. Women who have a family history of breast cancer should talk to their health care  provider about genetic screening. Women at a high risk of breast cancer should talk to their health care providers about having an MRI and a mammogram every year.  Breast cancer gene (BRCA)-related cancer risk assessment is recommended for women who have family members with BRCA-related cancers. BRCA-related cancers include breast, ovarian, tubal, and peritoneal cancers. Having family members with these cancers may be associated with an increased risk for harmful changes (mutations) in the breast cancer genes BRCA1 and BRCA2. Results of the assessment will determine the need for genetic counseling and BRCA1 and BRCA2 testing.  Routine pelvic exams to screen for cancer are no longer recommended for nonpregnant women who are considered low risk for cancer of the pelvic organs (ovaries, uterus, and vagina) and who do not have symptoms. Ask your health care provider if a screening pelvic exam is right for you.  If you  have had past treatment for cervical cancer or a condition that could lead to cancer, you need Pap tests and screening for cancer for at least 20 years after your treatment. If Pap tests have been discontinued, your risk factors (such as having a new sexual partner) need to be reassessed to determine if screening should be resumed. Some women have medical problems that increase the chance of getting cervical cancer. In these cases, your health care provider may recommend more frequent screening and Pap tests.  The HPV test is an additional test that may be used for cervical cancer screening. The HPV test looks for the virus that can cause the cell changes on the cervix. The cells collected during the Pap test can be tested for HPV. The HPV test could be used to screen women aged 59 years and older, and should be used in women of any age who have unclear Pap test results. After the age of 63, women should have HPV testing at the same frequency as a Pap test.  Colorectal cancer can be detected and  often prevented. Most routine colorectal cancer screening begins at the age of 75 years and continues through age 49 years. However, your health care provider may recommend screening at an earlier age if you have risk factors for colon cancer. On a yearly basis, your health care provider may provide home test kits to check for hidden blood in the stool. Use of a small camera at the end of a tube, to directly examine the colon (sigmoidoscopy or colonoscopy), can detect the earliest forms of colorectal cancer. Talk to your health care provider about this at age 46, when routine screening begins. Direct exam of the colon should be repeated every 5-10 years through age 38 years, unless early forms of pre-cancerous polyps or small growths are found.  People who are at an increased risk for hepatitis B should be screened for this virus. You are considered at high risk for hepatitis B if:  You were born in a country where hepatitis B occurs often. Talk with your health care provider about which countries are considered high risk.  Your parents were born in a high-risk country and you have not received a shot to protect against hepatitis B (hepatitis B vaccine).  You have HIV or AIDS.  You use needles to inject street drugs.  You live with, or have sex with, someone who has hepatitis B.  You get hemodialysis treatment.  You take certain medicines for conditions like cancer, organ transplantation, and autoimmune conditions.  Hepatitis C blood testing is recommended for all people born from [birth year] through 1963 and any individual with known risks for hepatitis C.  Practice safe sex. Use condoms and avoid high-risk sexual practices to reduce the spread of sexually transmitted infections (STIs). STIs include gonorrhea, chlamydia, syphilis, trichomonas, herpes, HPV, and human immunodeficiency virus (HIV). Herpes, HIV, and HPV are viral illnesses that have no cure. They can result in disability, cancer, and  death.  You should be screened for sexually transmitted illnesses (STIs) including gonorrhea and chlamydia if:  You are sexually active and are younger than 24 years.  You are older than 24 years and your health care provider tells you that you are at risk for this type of infection.  Your sexual activity has changed since you were last screened and you are at an increased risk for chlamydia or gonorrhea. Ask your health care provider if you are at risk.  If you are at  risk of being infected with HIV, it is recommended that you take a prescription medicine daily to prevent HIV infection. This is called preexposure prophylaxis (PrEP). You are considered at risk if:  You are a heterosexual woman, are sexually active, and are at increased risk for HIV infection.  You take drugs by injection.  You are sexually active with a partner who has HIV.  Talk with your health care provider about whether you are at high risk of being infected with HIV. If you choose to begin PrEP, you should first be tested for HIV. You should then be tested every 3 months for as long as you are taking PrEP.  Osteoporosis is a disease in which the bones lose minerals and strength with aging. This can result in serious bone fractures or breaks. The risk of osteoporosis can be identified using a bone density scan. Women ages 27 years and over and women at risk for fractures or osteoporosis should discuss screening with their health care providers. Ask your health care provider whether you should take a calcium supplement or vitamin D to reduce the rate of osteoporosis.  Menopause can be associated with physical symptoms and risks. Hormone replacement therapy is available to decrease symptoms and risks. You should talk to your health care provider about whether hormone replacement therapy is right for you.  Use sunscreen. Apply sunscreen liberally and repeatedly throughout the day. You should seek shade when your shadow is  shorter than you. Protect yourself by wearing long sleeves, pants, a wide-brimmed hat, and sunglasses year round, whenever you are outdoors.  Once a month, do a whole body skin exam, using a mirror to look at the skin on your back. Tell your health care provider of new moles, moles that have irregular borders, moles that are larger than a pencil eraser, or moles that have changed in shape or color.  Stay current with required vaccines (immunizations).  Influenza vaccine. All adults should be immunized every year.  Tetanus, diphtheria, and acellular pertussis (Td, Tdap) vaccine. Pregnant women should receive 1 dose of Tdap vaccine during each pregnancy. The dose should be obtained regardless of the length of time since the last dose. Immunization is preferred during the 27th-36th week of gestation. An adult who has not previously received Tdap or who does not know her vaccine status should receive 1 dose of Tdap. This initial dose should be followed by tetanus and diphtheria toxoids (Td) booster doses every 10 years. Adults with an unknown or incomplete history of completing a 3-dose immunization series with Td-containing vaccines should begin or complete a primary immunization series including a Tdap dose. Adults should receive a Td booster every 10 years.  Varicella vaccine. An adult without evidence of immunity to varicella should receive 2 doses or a second dose if she has previously received 1 dose. Pregnant females who do not have evidence of immunity should receive the first dose after pregnancy. This first dose should be obtained before leaving the health care facility. The second dose should be obtained 4-8 weeks after the first dose.  Human papillomavirus (HPV) vaccine. Females aged 13-26 years who have not received the vaccine previously should obtain the 3-dose series. The vaccine is not recommended for use in pregnant females. However, pregnancy testing is not needed before receiving a dose.  If a female is found to be pregnant after receiving a dose, no treatment is needed. In that case, the remaining doses should be delayed until after the pregnancy. Immunization is recommended  for any person with an immunocompromised condition through the age of 72 years if she did not get any or all doses earlier. During the 3-dose series, the second dose should be obtained 4-8 weeks after the first dose. The third dose should be obtained 24 weeks after the first dose and 16 weeks after the second dose.  Zoster vaccine. One dose is recommended for adults aged 75 years or older unless certain conditions are present.  Measles, mumps, and rubella (MMR) vaccine. Adults born before [birth year] generally are considered immune to measles and mumps. Adults born in [birth year] or later should have 1 or more doses of MMR vaccine unless there is a contraindication to the vaccine or there is laboratory evidence of immunity to each of the three diseases. A routine second dose of MMR vaccine should be obtained at least 28 days after the first dose for students attending postsecondary schools, health care workers, or international travelers. People who received inactivated measles vaccine or an unknown type of measles vaccine during 1963-1967 should receive 2 doses of MMR vaccine. People who received inactivated mumps vaccine or an unknown type of mumps vaccine before 1979 and are at high risk for mumps infection should consider immunization with 2 doses of MMR vaccine. For females of childbearing age, rubella immunity should be determined. If there is no evidence of immunity, females who are not pregnant should be vaccinated. If there is no evidence of immunity, females who are pregnant should delay immunization until after pregnancy. Unvaccinated health care workers born before [birth year] who lack laboratory evidence of measles, mumps, or rubella immunity or laboratory confirmation of disease should consider measles and mumps immunization with 2  doses of MMR vaccine or rubella immunization with 1 dose of MMR vaccine.  Pneumococcal 13-valent conjugate (PCV13) vaccine. When indicated, a person who is uncertain of her immunization history and has no record of immunization should receive the PCV13 vaccine. An adult aged 47 years or older who has certain medical conditions and has not been previously immunized should receive 1 dose of PCV13 vaccine. This PCV13 should be followed with a dose of pneumococcal polysaccharide (PPSV23) vaccine. The PPSV23 vaccine dose should be obtained at least 8 weeks after the dose of PCV13 vaccine. An adult aged 54 years or older who has certain medical conditions and previously received 1 or more doses of PPSV23 vaccine should receive 1 dose of PCV13. The PCV13 vaccine dose should be obtained 1 or more years after the last PPSV23 vaccine dose.  Pneumococcal polysaccharide (PPSV23) vaccine. When PCV13 is also indicated, PCV13 should be obtained first. All adults aged 33 years and older should be immunized. An adult younger than age 56 years who has certain medical conditions should be immunized. Any person who resides in a nursing home or long-term care facility should be immunized. An adult smoker should be immunized. People with an immunocompromised condition and certain other conditions should receive both PCV13 and PPSV23 vaccines. People with human immunodeficiency virus (HIV) infection should be immunized as soon as possible after diagnosis. Immunization during chemotherapy or radiation therapy should be avoided. Routine use of PPSV23 vaccine is not recommended for American Indians, [place] Natives, or people younger than 65 years unless there are medical conditions that require PPSV23 vaccine. When indicated, people who have unknown immunization and have no record of immunization should receive PPSV23 vaccine. One-time revaccination 5 years after the first dose of PPSV23 is recommended for people aged 19-64 years who  have chronic kidney failure, nephrotic  syndrome, asplenia, or immunocompromised conditions. People who received 1-2 doses of PPSV23 before age 77 years should receive another dose of PPSV23 vaccine at age 4 years or later if at least 5 years have passed since the previous dose. Doses of PPSV23 are not needed for people immunized with PPSV23 at or after age 97 years.  Meningococcal vaccine. Adults with asplenia or persistent complement component deficiencies should receive 2 doses of quadrivalent meningococcal conjugate (MenACWY-D) vaccine. The doses should be obtained at least 2 months apart. Microbiologists working with certain meningococcal bacteria, [employer], people at risk during an outbreak, and people who travel to or live in countries with a high rate of meningitis should be immunized. A first-year college student up through age 65 years who is living in a residence hall should receive a dose if she did not receive a dose on or after her 16th birthday. Adults who have certain high-risk conditions should receive one or more doses of vaccine.  Hepatitis A vaccine. Adults who wish to be protected from this disease, have certain high-risk conditions, work with hepatitis A-infected animals, work in hepatitis A research labs, or travel to or work in countries with a high rate of hepatitis A should be immunized. Adults who were previously unvaccinated and who anticipate close contact with an international adoptee during the first 60 days after arrival in the [place] from a country with a high rate of hepatitis A should be immunized.  Hepatitis B vaccine. Adults who wish to be protected from this disease, have certain high-risk conditions, may be exposed to blood or other infectious body fluids, are household contacts or sex partners of hepatitis B positive people, are clients or workers in certain care facilities, or travel to or work in countries with a high rate of hepatitis B should be  immunized.

## 2014-03-17 NOTE — Progress Notes (Signed)
Subjective:    Debra Phillips is a 80 y.o. female who presents for Medicare Initial Wellness Visit  Preventive Screening-Counseling & Management  Tobacco History  Smoking status  . Never Smoker   Smokeless tobacco  . Never Used     Current Problems (verified) Patient Active Problem List   Diagnosis Date Noted  . Osteoporotic compression fracture of spine with routine healing 03/08/2014  . Osteoporosis with fracture 03/08/2014  . Osteoporosis, post-menopausal 03/08/2014  . Precordial pain 02/16/2012  . PALPITATIONS 02/27/2010  . CAD 12/12/2009  . Aortic aneurysm of unspecified site without mention of rupture 06/27/2009  . DYSPNEA 06/27/2009  . DYSLIPIDEMIA 12/12/2008  . HYPERTENSION 12/12/2008  . GERD 12/12/2008  . OSTEOARTHRITIS 12/12/2008    Medications Prior to Visit Current Outpatient Prescriptions on File Prior to Visit  Medication Sig Dispense Refill  . aspirin 81 MG tablet Take 81 mg by mouth daily.        . calcium carbonate (OS-CAL) 600 MG TABS Take 600 mg by mouth daily.        . Cholecalciferol 1000 UNITS tablet Take 2,000 Units by mouth daily.        . Cinnamon 500 MG capsule Take 1,000 mg by mouth daily.        . citalopram (CELEXA) 20 MG tablet TAKE ONE TABLET BY MOUTH EVERY DAY AS DIRECTED  30 tablet  3  . diltiazem (CARDIZEM CD) 180 MG 24 hr capsule TAKE ONE CAPSULE BY MOUTH ONE TIME DAILY  90 capsule  1  . furosemide (LASIX) 20 MG tablet 2 pills the first day then one pill daily thereafter for fluid  30 tablet  3  . Lancets (ONETOUCH ULTRASOFT) lancets Use to check blood sugar once daily and prn  100 each  12  . lisinopril-hydrochlorothiazide (PRINZIDE,ZESTORETIC) 20-12.5 MG per tablet TAKE ONE TABLET BY MOUTH ONE TIME DAILY  30 tablet  3  . metFORMIN (GLUCOPHAGE) 500 MG tablet TAKE ONE TABLET BY MOUTH TWICE DAILY  60 tablet  2  . multivitamin (THERAGRAN) per tablet Take 1 tablet by mouth daily.        . Omega-3 Fatty Acids (FISH OIL) 1000 MG CAPS Take  2,000 mg by mouth daily.        Marland Kitchen omeprazole (PRILOSEC) 40 MG capsule Take 40 mg by mouth daily.      . ONE TOUCH ULTRA TEST test strip USE TO CHECK BLOOD GLUCOSE ONCE DAILY  50 each  10  . traMADol (ULTRAM) 50 MG tablet Take 50 mg by mouth as needed.         No current facility-administered medications on file prior to visit.    Current Medications (verified) Current Outpatient Prescriptions  Medication Sig Dispense Refill  . aspirin 81 MG tablet Take 81 mg by mouth daily.        . calcium carbonate (OS-CAL) 600 MG TABS Take 600 mg by mouth daily.        . Cholecalciferol 1000 UNITS tablet Take 2,000 Units by mouth daily.        . Cinnamon 500 MG capsule Take 1,000 mg by mouth daily.        . citalopram (CELEXA) 20 MG tablet TAKE ONE TABLET BY MOUTH EVERY DAY AS DIRECTED  30 tablet  3  . diltiazem (CARDIZEM CD) 180 MG 24 hr capsule TAKE ONE CAPSULE BY MOUTH ONE TIME DAILY  90 capsule  1  . furosemide (LASIX) 20 MG tablet 2 pills the first day  then one pill daily thereafter for fluid  30 tablet  3  . Lancets (ONETOUCH ULTRASOFT) lancets Use to check blood sugar once daily and prn  100 each  12  . lisinopril-hydrochlorothiazide (PRINZIDE,ZESTORETIC) 20-12.5 MG per tablet TAKE ONE TABLET BY MOUTH ONE TIME DAILY  30 tablet  3  . metFORMIN (GLUCOPHAGE) 500 MG tablet TAKE ONE TABLET BY MOUTH TWICE DAILY  60 tablet  2  . multivitamin (THERAGRAN) per tablet Take 1 tablet by mouth daily.        . Omega-3 Fatty Acids (FISH OIL) 1000 MG CAPS Take 2,000 mg by mouth daily.        Marland Kitchen omeprazole (PRILOSEC) 40 MG capsule Take 40 mg by mouth daily.      . ONE TOUCH ULTRA TEST test strip USE TO CHECK BLOOD GLUCOSE ONCE DAILY  50 each  10  . PROLIA 60 MG/ML SOLN injection       . traMADol (ULTRAM) 50 MG tablet Take 50 mg by mouth as needed.         No current facility-administered medications for this visit.     Allergies (verified) Doxycycline; Codeine; Sulfonamide derivatives; and Vancomycin   PAST  HISTORY  Family History Family History  Problem Relation Age of Onset  . Pneumonia Mother   . Brain cancer Father   . Arthritis Sister   . COPD Sister   . Emphysema Brother   . COPD Brother   . Lung cancer Brother   . Heart disease Brother   . Liver cancer Brother   . Diabetes Other      1 child has diabetes at age 11 and the other 66 are healthy    Social History History  Substance Use Topics  . Smoking status: Never Smoker   . Smokeless tobacco: Never Used  . Alcohol Use: No     Are there smokers in your home (other than you)? No  Risk Factors Current exercise habits: The patient does not participate in regular exercise at present.  Patient is going to start balance assessment with physical therapy next week Dietary issues discussed: limiting CHO serving sizes    Cardiac risk factors: advanced age (older than 56 for men, 86 for women), diabetes mellitus, dyslipidemia, family history of premature cardiovascular disease, obesity (BMI >= 30 kg/m2) and sedentary lifestyle.  Depression Screen - patient is taking citalopram daily (Note: if answer to either of the following is "Yes", a more complete depression screening is indicated)   Over the past 2 weeks, have you felt down, depressed or hopeless? No  Over the past 2 weeks, have you felt little interest or pleasure in doing things? No  Have you lost interest or pleasure in daily life? No  Do you often feel hopeless? No  Do you cry easily over simple problems? No  Activities of Daily Living In your present state of health, do you have any difficulty performing the following activities?:  Driving? No Managing money?  No Feeding yourself? No Getting from bed to chair? No  Climbing a flight of stairs? No Preparing food and eating?: No Bathing or showering? No Getting dressed: No Getting to the toilet? No Using the toilet:No Moving around from place to place: No In the past year have you fallen or had a near  fall?:No   Are you sexually active?  No  Do you have more than one partner?  No  Hearing Difficulties: No Do you often ask people to speak up or repeat  themselves? No Do you experience ringing or noises in your ears? No Do you have difficulty understanding soft or whispered voices? No   Do you feel that you have a problem with memory? No  Do you often misplace items? No  Do you feel safe at home?  Yes  Cognitive Testing  Alert? Yes    Normal Appearance?Yes  Oriented to person? Yes    Place? Yes   Time? Yes  Recall of three objects?  Yes  Can perform simple calculations? Yes  Displays appropriate judgment?Yes  Can read the correct time from a watch face?Yes   Advanced Directives have been discussed with the patient? Yes  List the Names of Other Physician/Practitioners you currently use: 1.  Urologist - Irine Seal 2.  Eye - Okey Regal 3.  Neurologist - Dr Glenna Fellows 4.  Pain Management - Dr Francesco Runner 5.  Cardio - Dr Jon Billings   Indicate any recent Medical Services you may have received from other than [facility] providers in the past year (date may be approximate).  Immunization History  Administered Date(s) Administered  . Influenza-Unspecified 04/25/2013  . Pneumococcal Conjugate-13 08/24/2013  . Zoster 03/17/2014    Screening Tests Health Maintenance  Topic Date Due  . Zostavax  11/14/1990  . Influenza Vaccine  03/25/2014  . Urine Microalbumin  05/04/2014  . Hemoglobin A1c  06/20/2014  . Ophthalmology Exam  07/08/2014  . Foot Exam  03/18/2015  . Tetanus/tdap  08/26/2015  . Colonoscopy  11/23/2021  . Pneumococcal Polysaccharide Vaccine Age 38 And Over  Completed    All answers were reviewed with the patient and necessary referrals were made:  Cherre Robins, [facility]   03/17/2014   History reviewed: allergies, current medications, past family history, past medical history, past social history, past surgical history and problem list     Objective:    Body  mass index is 35.45 kg/(m^2). BP 124/70  Pulse 74  Ht 5' (1.524 m)  Wt 181 lb 8 oz (82.328 kg)  BMI 35.45 kg/m2     Assessment:  Initial Annual Medicare Wellness Visit Obesity Type 2 DM    Plan:     During the course of the visit the patient was educated and counseled about appropriate screening and preventive services including:    Pneumococcal vaccine   Influenza vaccine  Hepatitis B vaccine  Td vaccine  Screening electrocardiogram  Screening mammography  Screening Pap smear and pelvic exam   Bone densitometry screening  Colorectal cancer screening  Glaucoma screening  Nutrition counseling   Advanced directives: caring connections packet given  Appt given to start prolia next week - rx called to [facility] and PA approved by her insruance.  Reviewed CHO serving sizes and discussed limiting CHO intake to 45 grams per meal and 15 grams per snack.   Printed out chart of medications, what they are used for and when to take.   Zostavax given today  Foot exam performed today   Diet review for nutrition referral? Patient refused   Patient Instructions (the written plan) was given to the patient.  Medicare Attestation I have personally reviewed: The patient's medical and social history Their use of alcohol, tobacco or illicit drugs Their current medications and supplements The patient's functional ability including ADLs,fall risks, home safety risks, cognitive, and hearing and visual impairment Diet and physical activities Evidence for depression or mood disorders  The patient's weight, height, BMI, and HR/BP have been recorded in the chart.  I have made  referrals, counseling, and provided education to the patient based on review of the above and I have provided the patient with a written personalized care plan for preventive services.     Cherre Robins, [facility]   03/17/2014

## 2014-03-18 ENCOUNTER — Emergency Department (HOSPITAL_COMMUNITY): Payer: Medicare Other

## 2014-03-18 ENCOUNTER — Encounter (HOSPITAL_COMMUNITY): Payer: Self-pay | Admitting: Emergency Medicine

## 2014-03-18 ENCOUNTER — Inpatient Hospital Stay (HOSPITAL_COMMUNITY)
Admission: EM | Admit: 2014-03-18 | Discharge: 2014-03-21 | DRG: 313 | Disposition: A | Discharge status: Home / Self Care | Payer: Medicare Other | Attending: Family Medicine | Admitting: Family Medicine

## 2014-03-18 DIAGNOSIS — Z96659 Presence of unspecified artificial knee joint: Secondary | ICD-10-CM | POA: Diagnosis not present

## 2014-03-18 DIAGNOSIS — Z981 Arthrodesis status: Secondary | ICD-10-CM

## 2014-03-18 DIAGNOSIS — R911 Solitary pulmonary nodule: Secondary | ICD-10-CM | POA: Diagnosis not present

## 2014-03-18 DIAGNOSIS — R634 Abnormal weight loss: Secondary | ICD-10-CM | POA: Diagnosis present

## 2014-03-18 DIAGNOSIS — Z66 Do not resuscitate: Secondary | ICD-10-CM | POA: Diagnosis present

## 2014-03-18 DIAGNOSIS — E785 Hyperlipidemia, unspecified: Secondary | ICD-10-CM | POA: Diagnosis present

## 2014-03-18 DIAGNOSIS — K2100 Gastro-esophageal reflux disease with esophagitis, without bleeding: Secondary | ICD-10-CM

## 2014-03-18 DIAGNOSIS — R079 Chest pain, unspecified: Secondary | ICD-10-CM | POA: Diagnosis not present

## 2014-03-18 DIAGNOSIS — F3289 Other specified depressive episodes: Secondary | ICD-10-CM | POA: Diagnosis present

## 2014-03-18 DIAGNOSIS — M1990 Unspecified osteoarthritis, unspecified site: Secondary | ICD-10-CM | POA: Diagnosis present

## 2014-03-18 DIAGNOSIS — N390 Urinary tract infection, site not specified: Secondary | ICD-10-CM | POA: Diagnosis not present

## 2014-03-18 DIAGNOSIS — J438 Other emphysema: Secondary | ICD-10-CM | POA: Diagnosis not present

## 2014-03-18 DIAGNOSIS — Z800 Family history of malignant neoplasm of digestive organs: Secondary | ICD-10-CM

## 2014-03-18 DIAGNOSIS — I10 Essential (primary) hypertension: Secondary | ICD-10-CM | POA: Diagnosis present

## 2014-03-18 DIAGNOSIS — K219 Gastro-esophageal reflux disease without esophagitis: Secondary | ICD-10-CM | POA: Diagnosis present

## 2014-03-18 DIAGNOSIS — F411 Generalized anxiety disorder: Secondary | ICD-10-CM | POA: Diagnosis present

## 2014-03-18 DIAGNOSIS — I719 Aortic aneurysm of unspecified site, without rupture: Secondary | ICD-10-CM | POA: Diagnosis not present

## 2014-03-18 DIAGNOSIS — G8929 Other chronic pain: Secondary | ICD-10-CM | POA: Diagnosis present

## 2014-03-18 DIAGNOSIS — F329 Major depressive disorder, single episode, unspecified: Secondary | ICD-10-CM | POA: Diagnosis present

## 2014-03-18 DIAGNOSIS — I2510 Atherosclerotic heart disease of native coronary artery without angina pectoris: Secondary | ICD-10-CM | POA: Diagnosis present

## 2014-03-18 DIAGNOSIS — M810 Age-related osteoporosis without current pathological fracture: Secondary | ICD-10-CM | POA: Diagnosis present

## 2014-03-18 DIAGNOSIS — R9389 Abnormal findings on diagnostic imaging of other specified body structures: Secondary | ICD-10-CM

## 2014-03-18 DIAGNOSIS — E119 Type 2 diabetes mellitus without complications: Secondary | ICD-10-CM | POA: Diagnosis present

## 2014-03-18 DIAGNOSIS — R0602 Shortness of breath: Secondary | ICD-10-CM

## 2014-03-18 DIAGNOSIS — I369 Nonrheumatic tricuspid valve disorder, unspecified: Secondary | ICD-10-CM | POA: Diagnosis not present

## 2014-03-18 DIAGNOSIS — Z7982 Long term (current) use of aspirin: Secondary | ICD-10-CM

## 2014-03-18 DIAGNOSIS — M549 Dorsalgia, unspecified: Secondary | ICD-10-CM | POA: Diagnosis present

## 2014-03-18 DIAGNOSIS — R0789 Other chest pain: Secondary | ICD-10-CM | POA: Diagnosis not present

## 2014-03-18 DIAGNOSIS — R918 Other nonspecific abnormal finding of lung field: Secondary | ICD-10-CM | POA: Diagnosis not present

## 2014-03-18 DIAGNOSIS — Z833 Family history of diabetes mellitus: Secondary | ICD-10-CM | POA: Diagnosis not present

## 2014-03-18 DIAGNOSIS — Z79899 Other long term (current) drug therapy: Secondary | ICD-10-CM | POA: Diagnosis not present

## 2014-03-18 DIAGNOSIS — R072 Precordial pain: Secondary | ICD-10-CM | POA: Diagnosis not present

## 2014-03-18 DIAGNOSIS — R6889 Other general symptoms and signs: Secondary | ICD-10-CM | POA: Diagnosis not present

## 2014-03-18 LAB — TSH: TSH: 3.52 u[IU]/mL (ref 0.350–4.500)

## 2014-03-18 LAB — URINALYSIS, ROUTINE W REFLEX MICROSCOPIC
Bilirubin Urine: NEGATIVE
Glucose, UA: NEGATIVE mg/dL
Hgb urine dipstick: NEGATIVE
Ketones, ur: NEGATIVE mg/dL
Nitrite: POSITIVE — AB
PH: 6.5 (ref 5.0–8.0)
Protein, ur: NEGATIVE mg/dL
SPECIFIC GRAVITY, URINE: 1.014 (ref 1.005–1.030)
Urobilinogen, UA: 0.2 mg/dL (ref 0.0–1.0)

## 2014-03-18 LAB — I-STAT TROPONIN, ED: Troponin i, poc: 0 ng/mL (ref 0.00–0.08)

## 2014-03-18 LAB — COMPREHENSIVE METABOLIC PANEL
ALBUMIN: 3.7 g/dL (ref 3.5–5.2)
ALT: 17 U/L (ref 0–35)
AST: 20 U/L (ref 0–37)
Alkaline Phosphatase: 62 U/L (ref 39–117)
Anion gap: 14 (ref 5–15)
BUN: 13 mg/dL (ref 6–23)
CALCIUM: 9.4 mg/dL (ref 8.4–10.5)
CO2: 26 meq/L (ref 19–32)
CREATININE: 0.78 mg/dL (ref 0.50–1.10)
Chloride: 95 mEq/L — ABNORMAL LOW (ref 96–112)
GFR calc Af Amer: 87 mL/min — ABNORMAL LOW (ref >90)
GFR calc non Af Amer: 75 mL/min — ABNORMAL LOW (ref >90)
Glucose, Bld: 127 mg/dL — ABNORMAL HIGH (ref 70–99)
Potassium: 4.1 mEq/L (ref 3.7–5.3)
SODIUM: 135 meq/L — AB (ref 137–147)
TOTAL PROTEIN: 6.6 g/dL (ref 6.0–8.3)
Total Bilirubin: 0.3 mg/dL (ref 0.3–1.2)

## 2014-03-18 LAB — CBC
HCT: 37.6 % (ref 36.0–46.0)
Hemoglobin: 12.4 g/dL (ref 12.0–15.0)
MCH: 29.8 pg (ref 26.0–34.0)
MCHC: 33 g/dL (ref 30.0–36.0)
MCV: 90.4 fL (ref 78.0–100.0)
PLATELETS: 292 10*3/uL (ref 150–400)
RBC: 4.16 MIL/uL (ref 3.87–5.11)
RDW: 13.6 % (ref 11.5–15.5)
WBC: 9.1 10*3/uL (ref 4.0–10.5)

## 2014-03-18 LAB — URINE MICROSCOPIC-ADD ON

## 2014-03-18 LAB — TROPONIN I: Troponin I: <0.3 ng/mL (ref <0.30)

## 2014-03-18 MED ORDER — LISINOPRIL-HYDROCHLOROTHIAZIDE 20-12.5 MG PO TABS: 1.0000 | ORAL_TABLET | Freq: Every day | ORAL | Status: DC

## 2014-03-18 MED ORDER — HYDROCHLOROTHIAZIDE 12.5 MG PO CAPS
12.5000 mg | ORAL_CAPSULE | Freq: Every day | ORAL | Status: DC
  Administered 2014-03-19 – 2014-03-21 (×3): 12.5 mg via ORAL
  Filled 2014-03-18 (×3): qty 1

## 2014-03-18 MED ORDER — MORPHINE SULFATE 2 MG/ML IJ SOLN: 1.0000 mg | INTRAMUSCULAR | Status: DC | PRN

## 2014-03-18 MED ORDER — LISINOPRIL 20 MG PO TABS
20.0000 mg | ORAL_TABLET | Freq: Every day | ORAL | Status: DC
  Administered 2014-03-19 – 2014-03-21 (×3): 20 mg via ORAL
  Filled 2014-03-18 (×3): qty 1

## 2014-03-18 MED ORDER — ENOXAPARIN SODIUM 40 MG/0.4ML ~~LOC~~ SOLN
40.0000 mg | SUBCUTANEOUS | Status: DC
  Administered 2014-03-18 – 2014-03-20 (×3): 40 mg via SUBCUTANEOUS
  Filled 2014-03-18 (×4): qty 0.4

## 2014-03-18 MED ORDER — ACETAMINOPHEN 325 MG PO TABS
650.0000 mg | ORAL_TABLET | ORAL | Status: DC | PRN
  Administered 2014-03-18 – 2014-03-20 (×2): 650 mg via ORAL
  Filled 2014-03-18: qty 2

## 2014-03-18 MED ORDER — ASPIRIN EC 81 MG PO TBEC
81.0000 mg | DELAYED_RELEASE_TABLET | Freq: Every day | ORAL | Status: DC
Start: 2014-03-19 — End: 2014-03-21
  Administered 2014-03-19 – 2014-03-21 (×3): 81 mg via ORAL
  Filled 2014-03-18 (×3): qty 1

## 2014-03-18 MED ORDER — TRAMADOL HCL 50 MG PO TABS
50.0000 mg | ORAL_TABLET | Freq: Every evening | ORAL | Status: DC | PRN
  Administered 2014-03-19: 50 mg via ORAL
  Filled 2014-03-18: qty 1

## 2014-03-18 MED ORDER — ONDANSETRON HCL 4 MG/2ML IJ SOLN: 4.0000 mg | Freq: Four times a day (QID) | INTRAMUSCULAR | Status: DC | PRN

## 2014-03-18 MED ORDER — CEPHALEXIN 500 MG PO CAPS
500.0000 mg | ORAL_CAPSULE | Freq: Four times a day (QID) | ORAL | Status: DC
  Administered 2014-03-18 – 2014-03-21 (×11): 500 mg via ORAL
  Filled 2014-03-18 (×12): qty 1

## 2014-03-18 MED ORDER — CITALOPRAM HYDROBROMIDE 10 MG PO TABS
10.0000 mg | ORAL_TABLET | Freq: Every day | ORAL | Status: DC
  Administered 2014-03-19 – 2014-03-21 (×3): 10 mg via ORAL
  Filled 2014-03-18 (×3): qty 1

## 2014-03-18 MED ORDER — DILTIAZEM HCL ER COATED BEADS 180 MG PO CP24
180.0000 mg | ORAL_CAPSULE | Freq: Every evening | ORAL | Status: DC
  Administered 2014-03-18 – 2014-03-20 (×3): 180 mg via ORAL
  Filled 2014-03-18 (×4): qty 1

## 2014-03-18 MED ORDER — PANTOPRAZOLE SODIUM 40 MG PO TBEC
40.0000 mg | DELAYED_RELEASE_TABLET | Freq: Every day | ORAL | Status: DC
  Administered 2014-03-19 – 2014-03-20 (×2): 40 mg via ORAL
  Filled 2014-03-18: qty 1

## 2014-03-18 NOTE — ED Provider Notes (Signed)
CSN: [id number]     Arrival date & time 03/18/14  1622 History   First MD Initiated Contact with Patient 03/18/14 1654     Chief Complaint  Patient presents with  . Chest Pain     HPI Pt reports to the ED for eval of midsternal CP that radiated into her back and into her left arm and jaw. Pt reports associated symptoms of nausea. Denies any other associated symptoms. Pt reports she noted the pain when she awoke this am with the CP and reports after the ASA her pain went from a 5/10 to a chest tightness and pressure.  Past Medical History  Diagnosis Date  . Hypertension   . Esophageal stricture     s/p dilation  . Osteoarthritis   . GERD (gastroesophageal reflux disease)   . Breast cyst     Right  . Dyslipidemia   . Aortic root dilatation     Mild  . Shortness of breath   . Diabetes mellitus without complication   . Hyperlipidemia   . Osteoporosis    Past Surgical History  Procedure Laterality Date  . Partial hysterectomy    . Total knee arthroplasty      Left  . Shoulder surgery      Right  . Knee arthroscopy  2004    Left and right  . Lumbar fusion  2000,2003  . Radial head arthroplasty  5/12    orif rt radial head  . Back surgery    . Abdominal hysterectomy    . Appendectomy    . Cholecystectomy  1993  . Cardiac catheterization  2010    both cataracts  . Fracture surgery      rt wrist2010  . Kyphosis surgery    . Hardware removal  12/10/2011    Procedure: HARDWARE REMOVAL;  Surgeon: Schuyler Amor, MD;  Location: [place];  Service: Orthopedics;  Laterality: Right;  radial head   Family History  Problem Relation Age of Onset  . Pneumonia Mother   . Brain cancer Father   . Arthritis Sister   . COPD Sister   . Emphysema Brother   . COPD Brother   . Lung cancer Brother   . Heart disease Brother   . Liver cancer Brother   . Diabetes Other      1 child has diabetes at age 29 and the other 4 are healthy   History  Substance Use Topics   . Smoking status: Never Smoker   . Smokeless tobacco: Never Used  . Alcohol Use: No   OB History   Grav Para Term Preterm Abortions TAB SAB Ect Mult Living                 Review of Systems  All other systems reviewed and are negative  Allergies  Codeine; Doxycycline; Other; Sulfonamide derivatives; and Vancomycin  Home Medications   Prior to Admission medications   Medication Sig Start Date End Date Taking? Authorizing Provider  aspirin 81 MG tablet Take 81 mg by mouth every evening.    Yes Historical Provider, MD  aspirin 81 MG tablet Take 81 mg by mouth every evening.   Yes Historical Provider, MD  Calcium Carb-Cholecalciferol 600-500 MG-UNIT CAPS Take 1 capsule by mouth every morning.   Yes Historical Provider, MD  Cholecalciferol 1000 UNITS tablet Take 1,000 Units by mouth daily.    Yes Historical Provider, MD  Cinnamon 500 MG capsule Take 500 mg by mouth daily.  Yes Historical Provider, MD  citalopram (CELEXA) 20 MG tablet Take 10 mg by mouth daily.   Yes Historical Provider, MD  dexlansoprazole (DEXILANT) 60 MG capsule Take 60 mg by mouth daily.   Yes Historical Provider, MD  diltiazem (CARDIZEM CD) 180 MG 24 hr capsule Take 180 mg by mouth every evening.   Yes Historical Provider, MD  Flaxseed, Linseed, (FLAX SEED OIL PO) Take 1 tablet by mouth daily.   Yes Historical Provider, MD  furosemide (LASIX) 20 MG tablet Take 40 mg by mouth daily as needed for fluid or edema.   Yes Historical Provider, MD  lisinopril-hydrochlorothiazide (PRINZIDE,ZESTORETIC) 20-12.5 MG per tablet Take 1 tablet by mouth daily.   Yes Historical Provider, MD  metFORMIN (GLUCOPHAGE) 500 MG tablet Take 500 mg by mouth 2 (two) times daily with a meal.   Yes Historical Provider, MD  multivitamin (THERAGRAN) per tablet Take 1 tablet by mouth daily.     Yes Historical Provider, MD  Omega-3 Fatty Acids (FISH OIL) 1000 MG CAPS Take 2,000 mg by mouth at bedtime.   Yes Historical Provider, MD  omeprazole  (PRILOSEC) 40 MG capsule Take 40 mg by mouth every morning.    Yes Historical Provider, MD  traMADol (ULTRAM) 50 MG tablet Take 50 mg by mouth at bedtime as needed for moderate pain.    Yes Historical Provider, MD  PROLIA 60 MG/ML SOLN injection  03/17/14   Historical Provider, MD   BP 117/81  Pulse 85  Temp(Src) 98.3 F (36.8 C)  Resp 16  Ht 5\' 2"  (1.575 m)  Wt 177 lb (80.287 kg)  BMI 32.37 kg/m2  SpO2 93% Physical Exam Physical Exam  Nursing note and vitals reviewed. Constitutional: She is oriented to person, place, and time. She appears well-developed and well-nourished. No distress.  HENT:  Head: Normocephalic and atraumatic.  Eyes: Pupils are equal, round, and reactive to light.  Neck: Normal range of motion.  Cardiovascular: Normal rate and intact distal pulses.   Pulmonary/Chest: No respiratory distress.  Abdominal: Normal appearance. She exhibits no distension.  Musculoskeletal: Normal range of motion.  Neurological: She is alert and oriented to person, place, and time. No cranial nerve deficit.  Skin: Skin is warm and dry. No rash noted.  Psychiatric: She has a normal mood and affect. Her behavior is normal.   ED Course  Procedures (including critical care time) Labs Review Labs Reviewed  COMPREHENSIVE METABOLIC PANEL - Abnormal; Notable for the following:    Sodium 135 (*)    Chloride 95 (*)    Glucose, Bld 127 (*)    GFR calc non Af Amer 75 (*)    GFR calc Af Amer 87 (*)    All other components within normal limits  URINALYSIS, ROUTINE W REFLEX MICROSCOPIC - Abnormal; Notable for the following:    APPearance CLOUDY (*)    Nitrite POSITIVE (*)    Leukocytes, UA SMALL (*)    All other components within normal limits  URINE MICROSCOPIC-ADD ON - Abnormal; Notable for the following:    Bacteria, UA MANY (*)    All other components within normal limits  URINE CULTURE  CBC  I-STAT TROPOININ, ED    Imaging Review Dg Chest 2 View  03/18/2014   CLINICAL DATA:   Chest pain  EXAM: CHEST  2 VIEW  COMPARISON:  Jan 04, 2013  FINDINGS: There is a 1.4 x 1.1 cm opacity in the left mid lung which was not appreciable on prior study. Elsewhere lungs  clear. Note that there is underlying emphysema. Heart size and pulmonary vascularity are normal. No adenopathy. Aorta is tortuous but stable. There is degenerative change in the thoracic spine.  IMPRESSION: 1.4 x 1.1 cm nodular opacity left mid lung, not seen previously. Advise noncontrast enhanced chest CT to further evaluate.  Underlying emphysema.  No edema or consolidation.  These results were called by telephone at the time of interpretation on 03/18/2014 at 5:56 pm to Dr. Christel Mormon verbally acknowledged these results.   Electronically Signed   By: Lowella Grip M.D.   On: 03/18/2014 17:57     EKG Interpretation   Date/Time:  Saturday March 18 2014 16:43:49 EDT Ventricular Rate:  88 PR Interval:  243 QRS Duration: 110 QT Interval:  391 QTC Calculation: 473 R Axis:   -53 Text Interpretation:  Sinus rhythm Prolonged PR interval LAD, consider  left anterior fascicular block Consider anterior infarct No significant  change since last tracing Confirmed by Shayne Diguglielmo  MD, Dorisann Schwanke ([id number]) on  03/18/2014 4:55:33 PM      MDM   Final diagnoses:  Chest pain, unspecified chest pain type  Abnormal chest x-ray  UTI (lower urinary tract infection)        Dot Lanes, MD 03/18/14 1859

## 2014-03-18 NOTE — ED Notes (Signed)
Pt reports to the ED for eval of midsternal CP that radiated into her back and into her left arm and jaw. Pt reports associated symptoms of nausea. Denies any other associated symptoms. Pt reports she noted the pain when she awoke this am with the CP and reports after the ASA her pain went from a 5/10 to a chest tightness and pressure. 12 lead en route showed NSR with 1st degree HB. Pt has 1 in of nitro paste and 324 of ASA PTA. VSS en route. Pt A&Ox4, resp e/u, and skin warm and dry.

## 2014-03-18 NOTE — H&P (Signed)
[facility] Admission History and Physical Service Pager: [phone]  Patient name: Debra Phillips Medical record number: [id number] Date of birth: [birth year] Age: 80 y.o. Gender: female  Primary Care Provider: Redge Gainer, MD Consultants: Cardiology Code Status: DNR, discussed with patient on admission  Chief Complaint: Chest pain  Assessment and Plan: Debra Phillips is a 80 y.o. female presenting with chest pain.  PMH is significant for CAD, HTN, HLD, T2DM, GERD, and chronic back pain.  Chest Pain, with h/o CAD: Substernal CP that came on with activity and was relieved with nitro.  Associated nausea and diaphoresis.  Patient followed by Dr. Percival Spanish as an outpatient.  Normal Lexiscan myoview in 02/2012.  Last Echo in 02/2012 showed EF 60-65%, normal wall motion, grade 1 diastolic dysfunction.  EKG unchanged from previous, iStat Troponin neg, CXR clear.  GERD is possibly a component of pain, but sounds more like typical angina. - Cycle troponins - Repeat EKG in AM - Monitor on tele - Risk stratification labs: Lipid panel and TSH in AM - Repeat Echo in AM - Will consult cardiology in AM for likely myoview  - Continue home Diltiazem (unclear from notes why the patient is taking this, but does have history of palpitations).  - Continue home aspirin 81mg  - Morphine 1mg  q4h prn for pain  UTI: UA Nitrite positive with symptoms of UTI.  VSS, afebrile, no leukocytosis. - Urine Culture pending - Keflex 500mg  QID x3d  T2DM: Well controlled. Last A1c 6.3 (4/15), on Metformin at home. - Holding home Metformin - Monitor CBGs - Can start SSI in the AM if CBGs are elevated  HTN: BP stable on admission. - Continue home Lisinopril-HCTZ - Continue to monitor  GERD: Possibly a component of CP. H/o esophageal stricture. - Continue home Protonix - GI cocktail prn  Chronic back pain: H/o spinal compression fracture. - Continue home  tramadol  ?Depression/Anxiety: Continue home celexa.  FEN/GI: NPO O/N, SLIV Prophylaxis: Lovenox  Disposition: Admit to tele for CP w/u. Home pending cardiac evaluation  History of Present Illness: Debra Phillips is a 80 y.o. female presenting with chest pain.  PMH is significant for CAD, HTN, HLD, T2DM, GERD, and chronic back pain.  Patient was in her usual state of health this afternoon when she started feeling nauseous while hanging curtains around her house at ~2:30pm. She then started having substernal chest pressure that radiated through to mid back.  Then she started having tingling in her L arm and her head felt "swimmified."  She then took 2 baby aspirin and called EMS.  Chest pain resolved with nitroglycerin patch.  Pt has had CP in the past 2/2 GERD and esophageal stricture that she reports felt much different than this pain.  Patient also reports urinary frequency and lower abd pain.  Denies fevers/chills/flank pain.  She just completed a course of antibiotics (she can not recall the name of the abx) for a UTI 1-2 weeks ago.  Review Of Systems: Per HPI with the following additions: no vomiting, diarrhea. Otherwise 12 point review of systems was performed and was unremarkable.  Patient Active Problem List   Diagnosis Date Noted  . Chest pain 03/18/2014  . Osteoporotic compression fracture of spine with routine healing 03/08/2014  . Osteoporosis with fracture 03/08/2014  . Osteoporosis, post-menopausal 03/08/2014  . Precordial pain 02/16/2012  . PALPITATIONS 02/27/2010  . CAD 12/12/2009  . Aortic aneurysm of unspecified site without mention of rupture 06/27/2009  . DYSPNEA 06/27/2009  .  DYSLIPIDEMIA 12/12/2008  . HYPERTENSION 12/12/2008  . GERD 12/12/2008  . OSTEOARTHRITIS 12/12/2008   Past Medical History: Past Medical History  Diagnosis Date  . Hypertension   . Esophageal stricture     s/p dilation  . Osteoarthritis   . GERD (gastroesophageal reflux disease)   .  Breast cyst     Right  . Dyslipidemia   . Aortic root dilatation     Mild  . Shortness of breath   . Diabetes mellitus without complication   . Hyperlipidemia   . Osteoporosis    Past Surgical History: Past Surgical History  Procedure Laterality Date  . Partial hysterectomy    . Total knee arthroplasty      Left  . Shoulder surgery      Right  . Knee arthroscopy  2004    Left and right  . Lumbar fusion  2000,2003  . Radial head arthroplasty  5/12    orif rt radial head  . Back surgery    . Abdominal hysterectomy    . Appendectomy    . Cholecystectomy  1993  . Cardiac catheterization  2010    both cataracts  . Fracture surgery      rt wrist2010  . Kyphosis surgery    . Hardware removal  12/10/2011    Procedure: HARDWARE REMOVAL;  Surgeon: Schuyler Amor, MD;  Location: [place];  Service: Orthopedics;  Laterality: Right;  radial head   Social History: History  Substance Use Topics  . Smoking status: Never Smoker   . Smokeless tobacco: Never Used  . Alcohol Use: No   Additional social history: Never smoker, lives alone.  Please also refer to relevant sections of EMR.  Family History: Family History  Problem Relation Age of Onset  . Pneumonia Mother   . Brain cancer Father   . Arthritis Sister   . COPD Sister   . Emphysema Brother   . COPD Brother   . Lung cancer Brother   . Heart disease Brother   . Liver cancer Brother   . Diabetes Other      1 child has diabetes at age 6 and the other 4 are healthy   Allergies and Medications: Allergies  Allergen Reactions  . Codeine Nausea And Vomiting  . Doxycycline Hives  . Other Nausea Only    Almost all antibiotics  . Sulfonamide Derivatives Hives  . Vancomycin Itching   No current facility-administered medications on file prior to encounter.   Current Outpatient Prescriptions on File Prior to Encounter  Medication Sig Dispense Refill  . aspirin 81 MG tablet Take 81 mg by mouth every  evening.       . Cholecalciferol 1000 UNITS tablet Take 1,000 Units by mouth daily.       . Cinnamon 500 MG capsule Take 500 mg by mouth daily.       . multivitamin (THERAGRAN) per tablet Take 1 tablet by mouth daily.        Marland Kitchen omeprazole (PRILOSEC) 40 MG capsule Take 40 mg by mouth every morning.       . traMADol (ULTRAM) 50 MG tablet Take 50 mg by mouth at bedtime as needed for moderate pain.       Marland Kitchen PROLIA 60 MG/ML SOLN injection         Objective: BP 117/81  Pulse 85  Temp(Src) 98.3 F (36.8 C)  Resp 16  Ht 5\' 2"  (1.575 m)  Wt 177 lb (80.287 kg)  BMI 32.37  kg/m2  SpO2 93% Exam: General: Well-appearing female sitting in bed in NAD HEENT: NCAT, MMM, OP clear, PERRL, EOMI Cardiovascular: RRR, no m/r/g. 2+ Post tib pulses b/l Respiratory: CTAB, no w/r/c. Normal WOB Abdomen: Soft, NT/ND, NABS, no HSM Extremities: 1+ pitting edema bilaterally Skin: No rashes Neuro: CN 2-12 intact, no gross deficits  Labs and Imaging: CBC BMET   Recent Labs Lab 03/18/14 1730  WBC 9.1  HGB 12.4  HCT 37.6  PLT 292    Recent Labs Lab 03/18/14 1730  NA 135*  K 4.1  CL 95*  CO2 26  BUN 13  CREATININE 0.78  GLUCOSE 127*  CALCIUM 9.4     Urinalysis    Component Value Date/Time   COLORURINE YELLOW 03/18/2014 1702   APPEARANCEUR CLOUDY* 03/18/2014 1702   LABSPEC 1.014 03/18/2014 1702   PHURINE 6.5 03/18/2014 1702   GLUCOSEU NEGATIVE 03/18/2014 1702   HGBUR NEGATIVE 03/18/2014 1702   BILIRUBINUR NEGATIVE 03/18/2014 1702   BILIRUBINUR neg 02/13/2014 1437   KETONESUR NEGATIVE 03/18/2014 1702   PROTEINUR NEGATIVE 03/18/2014 1702   PROTEINUR neg 02/13/2014 1437   UROBILINOGEN 0.2 03/18/2014 1702   UROBILINOGEN negative 02/13/2014 1437   NITRITE POSITIVE* 03/18/2014 1702   NITRITE neg 02/13/2014 1437   LEUKOCYTESUR SMALL* 03/18/2014 1702     EKG (7/25): NSR, L axis deviation, Q waves in I and aVL, 1st degree AV block, unchanged from previous EKG.  CXR (7/25) :  IMPRESSION: 1.4 x 1.1 cm  nodular opacity left mid lung, not seen previously. Advise noncontrast enhanced chest CT to further evaluate.  Underlying emphysema.  No edema or consolidation.    Troponin neg x1  Lavon Paganini, MD 03/18/2014, 7:21 PM PGY-1, [place] [facility] pager: [phone], text pages welcome  I have seen and evaluated the above patient with Dr. Brita Romp.  I agree with the above note; Addendum in blue.  [place] PGY-3

## 2014-03-19 ENCOUNTER — Encounter (HOSPITAL_COMMUNITY): Payer: Self-pay | Admitting: Cardiology

## 2014-03-19 DIAGNOSIS — R079 Chest pain, unspecified: Principal | ICD-10-CM

## 2014-03-19 DIAGNOSIS — R0602 Shortness of breath: Secondary | ICD-10-CM

## 2014-03-19 DIAGNOSIS — I369 Nonrheumatic tricuspid valve disorder, unspecified: Secondary | ICD-10-CM

## 2014-03-19 DIAGNOSIS — N390 Urinary tract infection, site not specified: Secondary | ICD-10-CM

## 2014-03-19 DIAGNOSIS — K2100 Gastro-esophageal reflux disease with esophagitis, without bleeding: Secondary | ICD-10-CM

## 2014-03-19 DIAGNOSIS — R9389 Abnormal findings on diagnostic imaging of other specified body structures: Secondary | ICD-10-CM

## 2014-03-19 LAB — TROPONIN I: Troponin I: <0.3 ng/mL (ref <0.30)

## 2014-03-19 LAB — BASIC METABOLIC PANEL
Anion gap: 12 (ref 5–15)
BUN: 14 mg/dL (ref 6–23)
CHLORIDE: 97 meq/L (ref 96–112)
CO2: 28 mEq/L (ref 19–32)
CREATININE: 0.81 mg/dL (ref 0.50–1.10)
Calcium: 8.8 mg/dL (ref 8.4–10.5)
GFR calc Af Amer: 76 mL/min — ABNORMAL LOW (ref >90)
GFR, EST NON AFRICAN AMERICAN: 65 mL/min — AB (ref >90)
Glucose, Bld: 118 mg/dL — ABNORMAL HIGH (ref 70–99)
POTASSIUM: 3.7 meq/L (ref 3.7–5.3)
Sodium: 137 mEq/L (ref 137–147)

## 2014-03-19 LAB — CBC
HEMATOCRIT: 34.6 % — AB (ref 36.0–46.0)
HEMOGLOBIN: 11.5 g/dL — AB (ref 12.0–15.0)
MCH: 30.5 pg (ref 26.0–34.0)
MCHC: 33.2 g/dL (ref 30.0–36.0)
MCV: 91.8 fL (ref 78.0–100.0)
Platelets: 292 10*3/uL (ref 150–400)
RBC: 3.77 MIL/uL — ABNORMAL LOW (ref 3.87–5.11)
RDW: 13.5 % (ref 11.5–15.5)
WBC: 6.3 10*3/uL (ref 4.0–10.5)

## 2014-03-19 LAB — LIPID PANEL
Cholesterol: 194 mg/dL (ref 0–200)
HDL: 30 mg/dL — AB (ref >39)
LDL CALC: 109 mg/dL — AB (ref 0–99)
TRIGLYCERIDES: 275 mg/dL — AB (ref <150)
Total CHOL/HDL Ratio: 6.5 RATIO
VLDL: 55 mg/dL — AB (ref 0–40)

## 2014-03-19 LAB — GLUCOSE, CAPILLARY
Glucose-Capillary: 148 mg/dL — ABNORMAL HIGH (ref 70–99)
Glucose-Capillary: 133 mg/dL — ABNORMAL HIGH (ref 70–99)
Glucose-Capillary: 130 mg/dL — ABNORMAL HIGH (ref 70–99)
Glucose-Capillary: 137 mg/dL — ABNORMAL HIGH (ref 70–99)
Glucose-Capillary: 154 mg/dL — ABNORMAL HIGH (ref 70–99)

## 2014-03-19 MED ORDER — DEXTROSE-NACL 5-0.45 % IV SOLN
INTRAVENOUS | Status: DC
  Administered 2014-03-20: 01:00:00 via INTRAVENOUS

## 2014-03-19 NOTE — Consult Note (Signed)
Primary cardiologist: [facility]  HPI: 80 year old female for evaluation of chest pain. Followed by Dr. Percival Spanish. Nuclear study 713 showed ejection fraction 74% and normal perfusion. Echocardiogram 713 showed normal LV function and grade 1 diastolic dysfunction. Trace aortic insufficiency. Patient has chronic dyspnea on exertion. She typically does not have orthopnea or PND there is occasional mild pedal edema. She has had intermittent nausea for approximately one month. Yesterday she developed some nausea followed by dull chest pain radiating to her left upper extremity, back and neck. The pain lasted approximately one hour and resolved. She had mild diaphoresis, dyspnea and nausea. Some of the description is somewhat difficult. She denies pleuritic component. She has had no pain since.  Medications Prior to Admission  Medication Sig Dispense Refill  . aspirin 81 MG tablet Take 81 mg by mouth every evening.       Marland Kitchen aspirin 81 MG tablet Take 81 mg by mouth every evening.      . Calcium Carb-Cholecalciferol 600-500 MG-UNIT CAPS Take 1 capsule by mouth every morning.      . Cholecalciferol 1000 UNITS tablet Take 1,000 Units by mouth daily.       . Cinnamon 500 MG capsule Take 500 mg by mouth daily.       . citalopram (CELEXA) 20 MG tablet Take 10 mg by mouth daily.      Marland Kitchen dexlansoprazole (DEXILANT) 60 MG capsule Take 60 mg by mouth daily.      Marland Kitchen diltiazem (CARDIZEM CD) 180 MG 24 hr capsule Take 180 mg by mouth every evening.      . Flaxseed, Linseed, (FLAX SEED OIL PO) Take 1 tablet by mouth daily.      . furosemide (LASIX) 20 MG tablet Take 40 mg by mouth daily as needed for fluid or edema.      Marland Kitchen lisinopril-hydrochlorothiazide (PRINZIDE,ZESTORETIC) 20-12.5 MG per tablet Take 1 tablet by mouth daily.      . metFORMIN (GLUCOPHAGE) 500 MG tablet Take 500 mg by mouth 2 (two) times daily with a meal.      . multivitamin (THERAGRAN) per tablet Take 1 tablet by mouth daily.        . Omega-3 Fatty Acids  (FISH OIL) 1000 MG CAPS Take 2,000 mg by mouth at bedtime.      Marland Kitchen omeprazole (PRILOSEC) 40 MG capsule Take 40 mg by mouth every morning.       . traMADol (ULTRAM) 50 MG tablet Take 50 mg by mouth at bedtime as needed for moderate pain.       Marland Kitchen PROLIA 60 MG/ML SOLN injection         Allergies  Allergen Reactions  . Codeine Nausea And Vomiting  . Doxycycline Hives  . Other Nausea Only    Almost all antibiotics  . Sulfonamide Derivatives Hives  . Vancomycin Itching    Past Medical History  Diagnosis Date  . Hypertension   . Esophageal stricture     s/p dilation  . Osteoarthritis   . GERD (gastroesophageal reflux disease)   . Breast cyst     Right  . Dyslipidemia   . Aortic root dilatation     Mild  . Shortness of breath   . Diabetes mellitus without complication   . Hyperlipidemia   . Osteoporosis     Past Surgical History  Procedure Laterality Date  . Partial hysterectomy    . Total knee arthroplasty      Left  . Shoulder surgery  Right  . Knee arthroscopy  2004    Left and right  . Lumbar fusion  2000,2003  . Radial head arthroplasty  5/12    orif rt radial head  . Back surgery    . Abdominal hysterectomy    . Appendectomy    . Cholecystectomy  1993  . Cardiac catheterization  2010    both cataracts  . Fracture surgery      rt wrist2010  . Kyphosis surgery    . Hardware removal  12/10/2011    Procedure: HARDWARE REMOVAL;  Surgeon: Schuyler Amor, MD;  Location: [place];  Service: Orthopedics;  Laterality: Right;  radial head    History   Social History  . Marital Status: Widowed    Spouse Name: N/A    Number of Children: 62  . Years of Education: N/A   Occupational History  . 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 Smoking status: Never Smoker   . Smokeless tobacco: Never Used  . Alcohol Use: No  . Drug Use: No  . Sexual Activity: No   Other Topics Concern  . Not on file    Social History Narrative   She and her husband live in a one story house with no step into main entrance.   Her husband has Alzheimer's and is progressing fairly quickly.    Family History  Problem Relation Age of Onset  . Pneumonia Mother   . Brain cancer Father   . Arthritis Sister   . COPD Sister   . Emphysema Brother   . COPD Brother   . Lung cancer Brother   . Heart disease Brother   . Liver cancer Brother   . Diabetes Other      1 child has diabetes at age 91 and the other 38 are healthy    ROS:  Some nausea but no fevers or chills, productive cough, hemoptysis, dysphasia, odynophagia, melena, hematochezia, dysuria, hematuria, rash, seizure activity, orthopnea, PND, claudication. Remaining systems are negative.  Physical Exam:   Blood pressure 126/55, pulse 64, temperature 97.9 F (36.6 C), temperature source Oral, resp. rate 18, height '5\' 2"'  (1.575 m), weight 176 lb (79.833 kg), SpO2 99.00%.  General:  Well developed/well nourished in NAD Skin warm/dry Patient not depressed No peripheral clubbing Back-normal HEENT-normal/normal eyelids Neck supple/normal carotid upstroke bilaterally; no bruits; no JVD; no thyromegaly chest - CTA/ normal expansion CV - RRR/normal S1 and S2; no murmurs, rubs or gallops;  PMI nondisplaced Abdomen -NT/ND, no HSM, no mass, + bowel sounds, no bruit 2+ femoral pulses, no bruits Ext-no edema, chords, 2+ DP Neuro-grossly nonfocal  ECG Sinus rhythm, first degree AV block, left axis deviation, cannot rule out prior septal infarct, no significant ST changes.  Results for orders placed during the hospital encounter of 03/18/14 (from the past 48 hour(s))  URINALYSIS, ROUTINE W REFLEX MICROSCOPIC     Status: Abnormal   Collection Time    03/18/14  5:02 PM      Result Value Ref Range   Color, Urine YELLOW  YELLOW   APPearance CLOUDY (*) CLEAR   Specific Gravity, Urine 1.014  1.005 - 1.030   pH 6.5  5.0 - 8.0   Glucose, UA NEGATIVE   NEGATIVE mg/dL   Hgb urine dipstick NEGATIVE  NEGATIVE   Bilirubin Urine NEGATIVE  NEGATIVE   Ketones, ur NEGATIVE  NEGATIVE mg/dL   Protein, ur  NEGATIVE  NEGATIVE mg/dL   Urobilinogen, UA 0.2  0.0 - 1.0 mg/dL   Nitrite POSITIVE (*) NEGATIVE   Leukocytes, UA SMALL (*) NEGATIVE  URINE MICROSCOPIC-ADD ON     Status: Abnormal   Collection Time    03/18/14  5:02 PM      Result Value Ref Range   Squamous Epithelial / LPF RARE  RARE   WBC, UA 3-6  <3 WBC/hpf   Bacteria, UA MANY (*) RARE  CBC     Status: None   Collection Time    03/18/14  5:30 PM      Result Value Ref Range   WBC 9.1  4.0 - 10.5 K/uL   RBC 4.16  3.87 - 5.11 MIL/uL   Hemoglobin 12.4  12.0 - 15.0 g/dL   HCT 37.6  36.0 - 46.0 %   MCV 90.4  78.0 - 100.0 fL   MCH 29.8  26.0 - 34.0 pg   MCHC 33.0  30.0 - 36.0 g/dL   RDW 13.6  11.5 - 15.5 %   Platelets 292  150 - 400 K/uL  COMPREHENSIVE METABOLIC PANEL     Status: Abnormal   Collection Time    03/18/14  5:30 PM      Result Value Ref Range   Sodium 135 (*) 137 - 147 mEq/L   Potassium 4.1  3.7 - 5.3 mEq/L   Chloride 95 (*) 96 - 112 mEq/L   CO2 26  19 - 32 mEq/L   Glucose, Bld 127 (*) 70 - 99 mg/dL   BUN 13  6 - 23 mg/dL   Creatinine, Ser 0.78  0.50 - 1.10 mg/dL   Calcium 9.4  8.4 - 10.5 mg/dL   Total Protein 6.6  6.0 - 8.3 g/dL   Albumin 3.7  3.5 - 5.2 g/dL   AST 20  0 - 37 U/L   ALT 17  0 - 35 U/L   Alkaline Phosphatase 62  39 - 117 U/L   Total Bilirubin 0.3  0.3 - 1.2 mg/dL   GFR calc non Af Amer 75 (*) >90 mL/min   GFR calc Af Amer 87 (*) >90 mL/min   Comment: (NOTE)     The eGFR has been calculated using the CKD EPI equation.     This calculation has not been validated in all clinical situations.     eGFR's persistently <90 mL/min signify possible Chronic Kidney     Disease.   Anion gap 14  5 - 15  I-STAT TROPOININ, ED     Status: None   Collection Time    03/18/14  5:34 PM      Result Value Ref Range   Troponin i, poc 0.00  0.00 - 0.08 ng/mL   Comment  3            Comment: Due to the release kinetics of cTnI,     a negative result within the first hours     of the onset of symptoms does not rule out     myocardial infarction with certainty.     If myocardial infarction is still suspected,     repeat the test at appropriate intervals.  GLUCOSE, CAPILLARY     Status: Abnormal   Collection Time    03/18/14  8:53 PM      Result Value Ref Range   Glucose-Capillary 148 (*) 70 - 99 mg/dL  TSH     Status: None   Collection Time  03/18/14  9:50 PM      Result Value Ref Range   TSH 3.520  0.350 - 4.500 uIU/mL  TROPONIN I     Status: None   Collection Time    03/18/14  9:50 PM      Result Value Ref Range   Troponin I <0.30  <0.30 ng/mL   Comment:            Due to the release kinetics of cTnI,     a negative result within the first hours     of the onset of symptoms does not rule out     myocardial infarction with certainty.     If myocardial infarction is still suspected,     repeat the test at appropriate intervals.  CBC     Status: Abnormal   Collection Time    03/19/14  3:00 AM      Result Value Ref Range   WBC 6.3  4.0 - 10.5 K/uL   Comment: WHITE COUNT CONFIRMED ON SMEAR   RBC 3.77 (*) 3.87 - 5.11 MIL/uL   Hemoglobin 11.5 (*) 12.0 - 15.0 g/dL   HCT 34.6 (*) 36.0 - 46.0 %   MCV 91.8  78.0 - 100.0 fL   MCH 30.5  26.0 - 34.0 pg   MCHC 33.2  30.0 - 36.0 g/dL   RDW 13.5  11.5 - 15.5 %   Platelets 292  150 - 400 K/uL  BASIC METABOLIC PANEL     Status: Abnormal   Collection Time    03/19/14  3:00 AM      Result Value Ref Range   Sodium 137  137 - 147 mEq/L   Potassium 3.7  3.7 - 5.3 mEq/L   Chloride 97  96 - 112 mEq/L   CO2 28  19 - 32 mEq/L   Glucose, Bld 118 (*) 70 - 99 mg/dL   BUN 14  6 - 23 mg/dL   Creatinine, Ser 0.81  0.50 - 1.10 mg/dL   Calcium 8.8  8.4 - 10.5 mg/dL   GFR calc non Af Amer 65 (*) >90 mL/min   GFR calc Af Amer 76 (*) >90 mL/min   Comment: (NOTE)     The eGFR has been calculated using the CKD  EPI equation.     This calculation has not been validated in all clinical situations.     eGFR's persistently <90 mL/min signify possible Chronic Kidney     Disease.   Anion gap 12  5 - 15  LIPID PANEL     Status: Abnormal   Collection Time    03/19/14  3:00 AM      Result Value Ref Range   Cholesterol 194  0 - 200 mg/dL   Triglycerides 275 (*) <150 mg/dL   HDL 30 (*) >39 mg/dL   Total CHOL/HDL Ratio 6.5     VLDL 55 (*) 0 - 40 mg/dL   LDL Cholesterol 109 (*) 0 - 99 mg/dL   Comment:            Total Cholesterol/HDL:CHD Risk     Coronary Heart Disease Risk Table                         Men   Women      1/2 Average Risk   3.4   3.3      Average Risk       5.0   4.4  2 X Average Risk   9.6   7.1      3 X Average Risk  23.4   11.0                Use the calculated Patient Ratio     above and the CHD Risk Table     to determine the patient's CHD Risk.                ATP III CLASSIFICATION (LDL):      <100     mg/dL   Optimal      100-129  mg/dL   Near or Above                        Optimal      130-159  mg/dL   Borderline      160-189  mg/dL   High      >190     mg/dL   Very High  TROPONIN I     Status: None   Collection Time    03/19/14  3:05 AM      Result Value Ref Range   Troponin I <0.30  <0.30 ng/mL   Comment:            Due to the release kinetics of cTnI,     a negative result within the first hours     of the onset of symptoms does not rule out     myocardial infarction with certainty.     If myocardial infarction is still suspected,     repeat the test at appropriate intervals.  GLUCOSE, CAPILLARY     Status: Abnormal   Collection Time    03/19/14  7:36 AM      Result Value Ref Range   Glucose-Capillary 133 (*) 70 - 99 mg/dL  TROPONIN I     Status: None   Collection Time    03/19/14  8:32 AM      Result Value Ref Range   Troponin I <0.30  <0.30 ng/mL   Comment:            Due to the release kinetics of cTnI,     a negative result within the first  hours     of the onset of symptoms does not rule out     myocardial infarction with certainty.     If myocardial infarction is still suspected,     repeat the test at appropriate intervals.  GLUCOSE, CAPILLARY     Status: Abnormal   Collection Time    03/19/14 11:39 AM      Result Value Ref Range   Glucose-Capillary 130 (*) 70 - 99 mg/dL    Dg Chest 2 View  03/18/2014   CLINICAL DATA:  Chest pain  EXAM: CHEST  2 VIEW  COMPARISON:  Jan 04, 2013  FINDINGS: There is a 1.4 x 1.1 cm opacity in the left mid lung which was not appreciable on prior study. Elsewhere lungs clear. Note that there is underlying emphysema. Heart size and pulmonary vascularity are normal. No adenopathy. Aorta is tortuous but stable. There is degenerative change in the thoracic spine.  IMPRESSION: 1.4 x 1.1 cm nodular opacity left mid lung, not seen previously. Advise noncontrast enhanced chest CT to further evaluate.  Underlying emphysema.  No edema or consolidation.  These results were called by telephone at the time of interpretation on 03/18/2014 at 5:56 pm to Dr. Christel Mormon verbally  acknowledged these results.   Electronically Signed   By: Lowella Grip M.D.   On: 03/18/2014 17:57    Assessment/Plan 1 chest pain-symptoms are atypical. Enzymes are negative. Multiple risk factors. Plan nuclear study tomorrow morning. 2 abnormal chest x-ray-she will need a followup chest CT per primary care. 3 possible UTI-management per primary care. 4 diabetes mellitus-follow CBGs. 5 hypertension-continue preadmission medications.  Kirk Ruths MD 03/19/2014, 1:10 PM

## 2014-03-19 NOTE — Progress Notes (Signed)
  Echocardiogram 2D Echocardiogram has been performed.  Debra Phillips FRANCES 03/19/2014, 4:20 PM

## 2014-03-19 NOTE — Progress Notes (Signed)
Family Medicine Teaching Service Daily Progress Note Intern Pager: [phone]  Patient name: Debra Phillips Medical record number: [id number] Date of birth: Sep 04, 1928 Age: 80 y.o. Gender: female  Primary Care Provider: Redge Gainer, MD Consultants: Cardiology Code Status: DNR  Pt Overview and Major Events to Date:  7/25 - Admitted  Assessment and Plan: Debra Phillips is a 80 y.o. female presenting with chest pain. PMH is significant for CAD, HTN, HLD, T2DM, GERD, and chronic back pain.   Chest Pain, with h/o CAD: Substernal CP that came on with activity and was relieved with nitro. Associated nausea and diaphoresis. Patient followed by Dr. Percival Spanish as an outpatient. Normal Lexiscan myoview in 02/2012. Last Echo in 02/2012 showed EF 60-65%, normal wall motion, grade 1 diastolic dysfunction. EKG unchanged from previous, iStat Troponin neg, CXR clear.  - Troponins negative x 2. Repeat EKG this am - NSR w/ 1st degree AV block. - Risk Stratification labs - TSH normal. Lipid panel - HDL 30, LDL 109, Triglycerides 275.  Recent A1C of 6.3 - Cardiology consulted this am; Awaiting recs; patient will most likely need nuclear stress test.  - Continue home Diltiazem (unclear from notes why the patient is taking this, but does have history of palpitations).  - Continue home aspirin 81mg   - Morphine 1mg  q4h prn for pain   UTI: UA Nitrite positive with symptoms of UTI. VSS, afebrile, no leukocytosis.  - Urine Culture pending  - Keflex 500mg  QID x3d   T2DM: Well controlled. Last A1c 6.3 (4/15), on Metformin at home.  - Holding home Metformin  - CBG's mildly elevated. Will continue to monitor.  HTN: BP stable on admission.  - Continue home Lisinopril-HCTZ  - Continue to monitor   GERD: Possibly a component of CP. H/o esophageal stricture.  - Continue home Protonix  - GI cocktail prn   Chronic back pain: H/o spinal compression fracture.  - Continue home tramadol   ?Depression/Anxiety:  Continue home celexa.   FEN/GI: NPO; SLIV Prophylaxis: Lovenox  Disposition: Pending cards consult/further workup.  Subjective:  No complaints this am.  No further chest pain.  Does report stable chronic back and neck pain.  Objective: Temp:  [97.3 F (36.3 C)-98.3 F (36.8 C)] 97.5 F (36.4 C) (07/26 0740) Pulse Rate:  [56-88] 58 (07/26 0740) Resp:  [16-24] 18 (07/26 0740) BP: (102-146)/(48-93) 118/64 mmHg (07/26 0740) SpO2:  [92 %-100 %] 100 % (07/26 0740) Weight:  [176 lb (79.833 kg)-177 lb (80.287 kg)] 176 lb (79.833 kg) (07/26 0400)  Physical Exam: General: Well-appearing female resting in bed; NAD.  Cardiovascular: RRR, no m/r/g. Respiratory: CTAB. No rales, rhonchi, or wheezing. Abdomen: Soft, nontender, nondistended. Extremities: Trace - 1+ LE edema bilaterally.  Laboratory:  Recent Labs Lab 03/18/14 1730 03/19/14 0300  WBC 9.1 6.3  HGB 12.4 11.5*  HCT 37.6 34.6*  PLT 292 292    Recent Labs Lab 03/18/14 1730 03/19/14 0300  NA 135* 137  K 4.1 3.7  CL 95* 97  CO2 26 28  BUN 13 14  CREATININE 0.78 0.81  CALCIUM 9.4 8.8  PROT 6.6  --   BILITOT 0.3  --   ALKPHOS 62  --   ALT 17  --   AST 20  --   GLUCOSE 127* 118*   Urinalysis    Component Value Date/Time   COLORURINE YELLOW 03/18/2014 1702   APPEARANCEUR CLOUDY* 03/18/2014 1702   LABSPEC 1.014 03/18/2014 1702   PHURINE 6.5 03/18/2014 1702   GLUCOSEU NEGATIVE  03/18/2014 1702   HGBUR NEGATIVE 03/18/2014 1702   BILIRUBINUR NEGATIVE 03/18/2014 1702   BILIRUBINUR neg 02/13/2014 1437   KETONESUR NEGATIVE 03/18/2014 1702   PROTEINUR NEGATIVE 03/18/2014 1702   PROTEINUR neg 02/13/2014 1437   UROBILINOGEN 0.2 03/18/2014 1702   UROBILINOGEN negative 02/13/2014 1437   NITRITE POSITIVE* 03/18/2014 1702   NITRITE neg 02/13/2014 1437   LEUKOCYTESUR SMALL* 03/18/2014 1702   Lipid Panel     Component Value Date/Time   CHOL 194 03/19/2014 0300   TRIG 275* 03/19/2014 0300   TRIG 362* 12/19/2013 1140   TRIG 205*  03/14/2013 1104   HDL 30* 03/19/2014 0300   HDL 34* 12/19/2013 1140   CHOLHDL 6.5 03/19/2014 0300   VLDL 55* 03/19/2014 0300   LDLCALC 109* 03/19/2014 0300   LDLCALC 117* 12/19/2013 1140   LDLCALC 101* 03/14/2013 1104    Lab Results  Component Value Date   TSH 3.520 03/18/2014    Imaging/Diagnostic Tests:  EKG (7/25): NSR, L axis deviation, Q waves in I and aVL, 1st degree AV block, unchanged from previous EKG.   CXR (7/25) : IMPRESSION: 1.4 x 1.1 cm nodular opacity left mid lung, not seen previously. Advise noncontrast enhanced chest CT to further evaluate. Underlying emphysema. No edema or consolidation.   Coral Spikes, DO 03/19/2014, 8:39 AM PGY-3, [place] Intern pager: [phone], text pages welcome

## 2014-03-19 NOTE — H&P (Signed)
FMTS Attending Admission Note: Annabell Sabal MD Personal pager:  [phone] FPTS Service Pager:  [phone]  I  have seen and examined this patient, reviewed their chart. I have discussed this patient with the resident. I agree with the resident's findings, assessment and care plan.  Additionally:  80 yo F with PMH of HTN, HLD, DM2, longstanding GERD and history of esophageal dilations who presents with 1 day history of substernal chest pain and nausea.  Has chronic nausea related to GERD symptoms.  States that whenever she has UTI, she is advised to stop her PPI while taking abx.  Diagnosed with UTI last week, therefore not taking her PPIs last week.  States CP started while working around home, radiated to Left jaw and Left arm.  Resolved with rest after a few minutes.  Has not had any pain since being brought to ED.  Exam:  Elderly female, lying in bed.  NAD.  Awake and alert.  RRR, lung clear.  Abdomen with some vague epigastric tenderness with palpation.  No edema LE's.    Imp/Plan: 1.  Chest pain: - Fairly typical chest pain.  Normal Myoview in 2013.  EKG with only Type 1 PR block.  No troponin elevation.   - Cardiology already consulted.  Await recommendations.  - Esophegeal reflux possible etiology as well.    2.  GERD/esophageal stricture: - not on PPI prior to admit, as above, possibly causing chest pain - Does endorse some mild weight loss, but states not eating well since husband passed away (which was ~6 years ago).  Followed by GI with regular endoscopies.    3.  Lung nodule:  - Left mid-lung.  Found on CXR.  Reported as not there previously - On review of records, had Left lung nodule 4 mm in size left apex.  Does not appear to be same nodule - With persistent epigastric pain and questionable weight loss, recommend CT to more fully evaluate lung nodule. Can be done outpatient, followed by PCP.  Could also include abdomen for any gastric malignancy, though sounds like none on  EGD.   Alveda Reasons, MD 03/19/2014 10:22 AM

## 2014-03-19 NOTE — Progress Notes (Signed)
FMTS Attending Daily Note:  Annabell Sabal MD  [phone] pager  Family Practice pager:  [phone] I have seen and examined this patient and have reviewed their chart. I have discussed this patient with the resident. I agree with the resident's findings, assessment and care plan.  Additionally:  See separate admit note.  Starting IVF as NPO for possible Myoview.  Alveda Reasons, MD 03/19/2014 1:11 PM

## 2014-03-20 ENCOUNTER — Inpatient Hospital Stay (HOSPITAL_COMMUNITY): Payer: Medicare Other

## 2014-03-20 ENCOUNTER — Ambulatory Visit (HOSPITAL_COMMUNITY): Payer: Medicare Other

## 2014-03-20 ENCOUNTER — Ambulatory Visit: Payer: Self-pay

## 2014-03-20 ENCOUNTER — Telehealth: Payer: Self-pay | Admitting: Pharmacist

## 2014-03-20 DIAGNOSIS — R079 Chest pain, unspecified: Secondary | ICD-10-CM

## 2014-03-20 DIAGNOSIS — I719 Aortic aneurysm of unspecified site, without rupture: Secondary | ICD-10-CM

## 2014-03-20 DIAGNOSIS — R911 Solitary pulmonary nodule: Secondary | ICD-10-CM

## 2014-03-20 LAB — GLUCOSE, CAPILLARY
GLUCOSE-CAPILLARY: 142 mg/dL — AB (ref 70–99)
GLUCOSE-CAPILLARY: 169 mg/dL — AB (ref 70–99)
GLUCOSE-CAPILLARY: 108 mg/dL — AB (ref 70–99)
Glucose-Capillary: 148 mg/dL — ABNORMAL HIGH (ref 70–99)

## 2014-03-20 MED ORDER — REGADENOSON 0.4 MG/5ML IV SOLN
0.4000 mg | Freq: Once | INTRAVENOUS | Status: AC
  Administered 2014-03-20: 0.4 mg via INTRAVENOUS
  Filled 2014-03-20: qty 5

## 2014-03-20 MED ORDER — CEPHALEXIN 500 MG PO CAPS: 500.0000 mg | ORAL_CAPSULE | Freq: Four times a day (QID) | ORAL | Status: DC

## 2014-03-20 MED ORDER — REGADENOSON 0.4 MG/5ML IV SOLN
INTRAVENOUS | Status: AC
  Administered 2014-03-20: 0.4 mg via INTRAVENOUS
  Filled 2014-03-20: qty 5

## 2014-03-20 MED ORDER — TECHNETIUM TC 99M SESTAMIBI GENERIC - CARDIOLITE
10.0000 | Freq: Once | INTRAVENOUS | Status: AC | PRN
  Administered 2014-03-20: 10 via INTRAVENOUS

## 2014-03-20 MED ORDER — DEXLANSOPRAZOLE 60 MG PO CPDR
60.0000 mg | DELAYED_RELEASE_CAPSULE | Freq: Every day | ORAL | Status: DC
  Administered 2014-03-20 – 2014-03-21 (×2): 60 mg via ORAL
  Filled 2014-03-20 (×2): qty 1

## 2014-03-20 MED ORDER — TECHNETIUM TC 99M SESTAMIBI GENERIC - CARDIOLITE
30.0000 | Freq: Once | INTRAVENOUS | Status: AC | PRN
  Administered 2014-03-20: 30 via INTRAVENOUS

## 2014-03-20 MED ORDER — ACETAMINOPHEN 325 MG PO TABS
ORAL_TABLET | ORAL | Status: AC
  Filled 2014-03-20: qty 2

## 2014-03-20 NOTE — Progress Notes (Signed)
Stress test results reviewed with Dr. Angelena Form.  Since low risk and no moderate areas of ischemia, no further cardiac workup planned.   Rec: Aggressive CRF control and f/u with cardiology (will arrange).

## 2014-03-20 NOTE — Progress Notes (Signed)
Nitropaste taken off for [employer]

## 2014-03-20 NOTE — Progress Notes (Signed)
Lexiscan MV performed 

## 2014-03-20 NOTE — Telephone Encounter (Signed)
Called daughter - patient is having stress test today.  She has not had any chest pain since she was admitted to hospital.  Recommended reschedule appt for Prolia injection when patient at home and feeling better.

## 2014-03-20 NOTE — Progress Notes (Signed)
Family Medicine Teaching Service Daily Progress Note Intern Pager: [phone]  Patient name: Debra Phillips Medical record number: [id number] Date of birth: Dec 21, 1928 Age: 80 y.o. Gender: female  Primary Care Provider: Redge Gainer, MD Consultants: Cardiology Code Status: DNR  Pt Overview and Major Events to Date:  7/25 - Admitted for chest pain  Assessment and Plan:  Debra Phillips is a 80 y.o. female presenting with chest pain. PMH is significant for CAD, HTN, HLD, T2DM, GERD, and chronic back pain.   Chest Pain, with h/o CAD:  EKG unchanged from previous, Troponins neg x3, CXR clear. - Cardiology following, appreciate recs - Lexiscan myoview today - Risk Strat labs - TSH nl. Lipid panel - HDL 30, LDL 109, Trig 275.  Recent A1C of 6.3 - Echo (7/26) read pending - Continue home Diltiazem (unclear from notes why the patient is taking this, but does have h/o palpitations).  - Continue home aspirin 81mg   - Morphine 1mg  q4h prn for pain - pt not requiring any since admission  UTI: UA Nitrite positive with symptoms of UTI. VSS, afebrile, no leukocytosis.  - Urine Culture pending  - Keflex 500mg  QID x3d   T2DM: Well controlled. Last A1c 6.3 (4/15), on Metformin at home.  - Holding home Metformin  - CBGs mildly elevated (130-154). Will continue to monitor.  HTN: BP stable.  - Continue home Lisinopril-HCTZ  - Continue to monitor   GERD: Possibly a component of CP. H/o esophageal stricture.  - Continue home Protonix   Chronic back pain: H/o spinal compression fracture.  - Continue home tramadol   ?Depression/Anxiety: Mood stable. Continue home celexa.   Solitary pulmonary nodule: 1cm SNP noted in mid-left lung on CXR on admission. - Will f/u as an outpatient with PCP.  Will likely need chest CT to further evaluate  FEN/GI: NPO; SLIV Prophylaxis: Lovenox  Disposition: Home pending cards consult/further workup.  Subjective:  No complaints this am.  No further chest  pain.  Does report stable chronic back and neck pain. Would like to go home.  Objective: Temp:  [97.5 F (36.4 C)-98.3 F (36.8 C)] 98.3 F (36.8 C) (07/27 0400) Pulse Rate:  [56-65] 56 (07/27 0400) Resp:  [15-18] 16 (07/27 0400) BP: (117-130)/(55-68) 117/68 mmHg (07/27 0400) SpO2:  [94 %-99 %] 94 % (07/27 0400) Weight:  [173 lb (78.472 kg)] 173 lb (78.472 kg) (07/27 0700)  Physical Exam: General: Well-appearing female sitting in bed; NAD.  Cardiovascular: RRR, no m/r/g. Respiratory: CTAB. No rales, rhonchi, or wheezing. Abdomen: Soft, nontender, nondistended. Extremities: Trace - 1+ LE edema bilaterally.  Laboratory:  Recent Labs Lab 03/18/14 1730 03/19/14 0300  WBC 9.1 6.3  HGB 12.4 11.5*  HCT 37.6 34.6*  PLT 292 292    Recent Labs Lab 03/18/14 1730 03/19/14 0300  NA 135* 137  K 4.1 3.7  CL 95* 97  CO2 26 28  BUN 13 14  CREATININE 0.78 0.81  CALCIUM 9.4 8.8  PROT 6.6  --   BILITOT 0.3  --   ALKPHOS 62  --   ALT 17  --   AST 20  --   GLUCOSE 127* 118*   Urinalysis    Component Value Date/Time   COLORURINE YELLOW 03/18/2014 1702   APPEARANCEUR CLOUDY* 03/18/2014 1702   LABSPEC 1.014 03/18/2014 1702   PHURINE 6.5 03/18/2014 1702   GLUCOSEU NEGATIVE 03/18/2014 1702   HGBUR NEGATIVE 03/18/2014 1702   BILIRUBINUR NEGATIVE 03/18/2014 1702   BILIRUBINUR neg 02/13/2014 1437  KETONESUR NEGATIVE 03/18/2014 1702   PROTEINUR NEGATIVE 03/18/2014 1702   PROTEINUR neg 02/13/2014 1437   UROBILINOGEN 0.2 03/18/2014 1702   UROBILINOGEN negative 02/13/2014 1437   NITRITE POSITIVE* 03/18/2014 1702   NITRITE neg 02/13/2014 1437   LEUKOCYTESUR SMALL* 03/18/2014 1702   Lipid Panel     Component Value Date/Time   CHOL 194 03/19/2014 0300   TRIG 275* 03/19/2014 0300   TRIG 362* 12/19/2013 1140   TRIG 205* 03/14/2013 1104   HDL 30* 03/19/2014 0300   HDL 34* 12/19/2013 1140   CHOLHDL 6.5 03/19/2014 0300   VLDL 55* 03/19/2014 0300   LDLCALC 109* 03/19/2014 0300   LDLCALC 117* 12/19/2013  1140   LDLCALC 101* 03/14/2013 1104    Lab Results  Component Value Date   TSH 3.520 03/18/2014    Imaging/Diagnostic Tests:  EKG (7/25): NSR, L axis deviation, Q waves in I and aVL, 1st degree AV block, unchanged from previous EKG.   EKG (7/26): NSR, L axis deviation, Q waves in I and aVL, 1st degree AV block, unchanged from previous EKG.   CXR (7/25) : IMPRESSION: 1.4 x 1.1 cm nodular opacity left mid lung, not seen previously. Advise noncontrast enhanced chest CT to further evaluate. Underlying emphysema. No edema or consolidation.     Lavon Paganini, MD 03/20/2014, 8:02 AM PGY-1, [place] Intern pager: [phone], text pages welcome

## 2014-03-20 NOTE — Discharge Summary (Signed)
Debra Phillips  Patient name: Debra Phillips Medical record number: [id number] Date of birth: 1929/06/07 Age: 80 y.o. Gender: female Date of Admission: 03/18/2014  Date of Discharge: 03/21/2014 Admitting Physician: Alveda Reasons, MD  Primary Care Provider: Redge Gainer, MD Consultants: Cardiology  Indication for Hospitalization: Chest pain  Discharge Diagnoses/Problem List:  Chest pain CAD UTI T2DM HTN GERD Chronic back pain with h/o compression fracture ?Depression/Anxiety Rib sclerosis  Disposition: Home  Discharge Condition: Stable  Discharge Exam: General: Well-appearing female sitting in bed; NAD.  Cardiovascular: RRR, no m/r/g.  Respiratory: CTAB. No rales, rhonchi, or wheezing.  Abdomen: Soft, nontender, nondistended.  Extremities: Trace - 1+ LE edema bilaterally.  Brief Hospital Course:   Debra Phillips is a 80 y.o. female presenting with chest pain. PMH is significant for CAD, HTN, HLD, T2DM, GERD, and chronic back pain.   Chest Pain, with h/o CAD: Patient presented with substernal CP that came on with activity and relieved with Nitro.  EKGs unchanged from previous, Troponins neg x3, CXR clear.  Cardiology consulted.  Risk stratification labs normal.  Echo unchanged from previous Echo in 2013 (results below).  Low risk nuclear stress test.  Home Diltiazem was continued, though it was unclear from notes why the patient is taking this, but does have h/o palpitations. Home baby aspirin was continued.  CP resolved before discharge.  UTI: In the ED, UA was Nitrite positive, and patient reported lower abdominal pain and urinary frequency.  Her vital signs were stable, she was afebrile and had no leukocytosis throughout admission. Urine culture showed Klebsiella pneumoniae only resistant to ampicillin and intermediate to Macrobid.  She completed a 3 day course of Keflex 500mg  QID while inpatient.  Symptoms resolved before  discharge.  T2DM: Last A1c 6.3 (4/15), repeat Hgb A1c was ordered on admission, but never resulted.  Pt takes Metformin at home, but this was held on admission.  Her CBGs were well controlled throughout admission without any insulin or Metformin.   GERD: This was thought to be a possible contributor to her CP.  She does have a h/o esophageal stricture. Her home protonix was continued throughout admission.  Symptoms stable. CP resolved before discharge.  Solitary pulmonary nodule: 1cm SNP noted incidentally in mid-left lung on CXR on admission.   Chest CT revealed no pulmonary nodules, only rib sclerosis.  No further evaluation required. Patient reassured.  All other chronic conditions were stable throughout admission and were treated with home regimens.  Issues for Follow Up:  1. Should f/u with Cardiologist for chest pain. 2. Repeat Hgb A1c never drawn during admission.  Last one in 11/2013.  Consider repeat as outpatient.  CBGs well controlled off of Metformin, consider d/c as outpatient.  Significant Procedures: Lexiscan Myoview  Significant Labs and Imaging:   Recent Labs Lab 03/18/14 1730 03/19/14 0300  WBC 9.1 6.3  HGB 12.4 11.5*  HCT 37.6 34.6*  PLT 292 292    Recent Labs Lab 03/18/14 1730 03/19/14 0300  NA 135* 137  K 4.1 3.7  CL 95* 97  CO2 26 28  GLUCOSE 127* 118*  BUN 13 14  CREATININE 0.78 0.81  CALCIUM 9.4 8.8  ALKPHOS 62  --   AST 20  --   ALT 17  --   ALBUMIN 3.7  --     Urinalysis    Component Value Date/Time   COLORURINE YELLOW 03/18/2014 1702   APPEARANCEUR CLOUDY* 03/18/2014 1702   LABSPEC 1.014 03/18/2014  [place] 6.5 03/18/2014 [place] 03/18/2014 1702   HGBUR NEGATIVE 03/18/2014 [place] 03/18/2014 1702   BILIRUBINUR neg 02/13/2014 1437   [place] 03/18/2014 1702   PROTEINUR NEGATIVE 03/18/2014 1702   PROTEINUR neg 02/13/2014 1437   UROBILINOGEN 0.2 03/18/2014 1702   UROBILINOGEN negative  02/13/2014 1437   NITRITE POSITIVE* 03/18/2014 1702   NITRITE neg 02/13/2014 1437   LEUKOCYTESUR SMALL* 03/18/2014 1702    UCx (7/25): Klebsiella pneumoniae, resistant only to ampicillin and intermediate to Macrobid.  TSH 3.520, Hgb A1c 6.3 in 11/2013. Lipid Panel     Component Value Date/Time   CHOL 194 03/19/2014 0300   TRIG 275* 03/19/2014 0300   TRIG 362* 12/19/2013 1140   TRIG 205* 03/14/2013 1104   HDL 30* 03/19/2014 0300   HDL 34* 12/19/2013 1140   CHOLHDL 6.5 03/19/2014 0300   VLDL 55* 03/19/2014 0300   LDLCALC 109* 03/19/2014 0300   LDLCALC 117* 12/19/2013 1140   LDLCALC 101* 03/14/2013 1104     Echo (7/26): Normal LV function, EF 55-60%, normal wall motion, grade 1 diastolic dysfunction, trivial AV regurg, moderate dilation of ascending aorta (unchanged from previous Echo), mod LA dilation, atrial septal aneurysm.   Lexiscan (7/27): Low risk stress nuclear study with mild chest pain but no ST  changes. The scintigraphic results show soft tissue attenuation and  mild ischemia in the distal anterior wall. Gated ejection fraction  76% and wall motion normal.  CT Chest (7/27): 1. The nodular density seen on the frontal view of the recent chest  radiograph dated 03/18/2014 corresponds to a stable benign appearing  sclerotic lesion in the left seventh rib. This sclerotic rib lesion  is unchanged compared to a chest CT of 2011.  2. Negative pulmonary mass/nodule.  3. Coronary artery and thoracic aorta atherosclerosis.  4. Prior L2 vertebroplasty.   Results/Tests Pending at Time of Discharge: None  Discharge Medications:    Medication List         aspirin 81 MG tablet  Take 81 mg by mouth every evening.     Calcium Carb-Cholecalciferol 600-500 MG-UNIT Caps  Take 1 capsule by mouth every morning.     Cholecalciferol 1000 UNITS tablet  Take 1,000 Units by mouth daily.     Cinnamon 500 MG capsule  Take 500 mg by mouth daily.     citalopram 20 MG tablet  Commonly known as:   CELEXA  Take 10 mg by mouth daily.     DEXILANT 60 MG capsule  Generic drug:  dexlansoprazole  Take 60 mg by mouth daily.     diltiazem 180 MG 24 hr capsule  Commonly known as:  CARDIZEM CD  Take 180 mg by mouth every evening.     Fish Oil 1000 MG Caps  Take 2,000 mg by mouth at bedtime.     FLAX SEED OIL PO  Take 1 tablet by mouth daily.     furosemide 20 MG tablet  Commonly known as:  LASIX  Take 40 mg by mouth daily as needed for fluid or edema.     lisinopril-hydrochlorothiazide 20-12.5 MG per tablet  Commonly known as:  PRINZIDE,ZESTORETIC  Take 1 tablet by mouth daily.     metFORMIN 500 MG tablet  Commonly known as:  GLUCOPHAGE  Take 500 mg by mouth 2 (two) times daily with a meal.     multivitamin per tablet  Take 1 tablet by mouth daily.  omeprazole 40 MG capsule  Commonly known as:  PRILOSEC  Take 40 mg by mouth every morning.     PROLIA 60 MG/ML Soln injection  Generic drug:  denosumab     traMADol 50 MG tablet  Commonly known as:  ULTRAM  Take 50 mg by mouth at bedtime as needed for moderate pain.        Discharge Instructions: Please refer to Patient Instructions section of EMR for full details.  Patient was counseled important signs and symptoms that should prompt return to medical care, changes in medications, dietary instructions, activity restrictions, and follow up appointments.   Follow-Up Appointments: Follow-up Information   Call Redge Gainer, MD. (Call to make an appointment for hospital follow-up within the next week.)    Specialty:  Family Medicine   Contact information:   [place] [address] [phone]       Call Minus Breeding, MD. (Call to make an appointment with your cardiologist)    Specialty:  Cardiology   Contact information:   [place] [address] [phone]       Lavon Paganini, MD 03/22/2014, 11:40 AM PGY-1, [place]

## 2014-03-20 NOTE — Progress Notes (Signed)
I have seen and examined this patient. I have discussed with Dr Brita Romp.  I agree with their findings and plans as documented in their progress note.  Chest Pain  - Resolved - Awaiting Nuclear myocardial perfusion study results.  Solitary Pulmonary Nodule, Possible:  CHEST XRAY finding (03/18/14) new from 12/2012 CHEST XRAY.  Agree with non-contrasted CT to confirm presence of nodule, and characterize it if it is present.  - Set up outpatient non-contrasted Chest CT if patient to be discharged today.  If not to be discharged today, then perform CT inhouse.   Aortic Root Dilation - Echocardiogram showed 34 mm Aortic root diameter which same diameter as on Echo 03/04/12. - Monitor as outpatient in follow up with cardiology
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
## 2014-03-20 NOTE — Progress Notes (Signed)
UR Completed Meggan Dhaliwal Graves-Bigelow, RN,BSN [phone]  

## 2014-03-20 NOTE — Care Management Note (Signed)
    Page 1 of 1   03/20/2014     11:59:08 AM CARE MANAGEMENT NOTE 03/20/2014  Patient:  Debra Phillips, Debra Phillips   Account Number:  [id number]  Date Initiated:  03/20/2014  Documentation initiated by:  GRAVES-BIGELOW,Carely Nappier  Subjective/Objective Assessment:   Pt admitted for cp. S/p stress test 03-20-14.     Action/Plan:   No neds from CM at this time.   Anticipated DC Date:  03/20/2014   Anticipated DC Plan:  HOME/SELF CARE         Choice offered to / List presented to:             Status of service:  Completed, signed off Medicare Important Message given?  NA - LOS <3 / Initial given by admissions (If response is "NO", the following Medicare IM given date fields will be blank) Date Medicare IM given:   Medicare IM given by:   Date Additional Medicare IM given:   Additional Medicare IM given by:    Discharge Disposition:  HOME/SELF CARE  Per UR Regulation:  Reviewed for med. necessity/level of care/duration of stay  If discussed at [place] of Stay Meetings, dates discussed:    Comments:

## 2014-03-21 LAB — GLUCOSE, CAPILLARY
GLUCOSE-CAPILLARY: 139 mg/dL — AB (ref 70–99)
Glucose-Capillary: 136 mg/dL — ABNORMAL HIGH (ref 70–99)

## 2014-03-21 NOTE — Discharge Instructions (Signed)
You were admitted for chest pain.  All of the tests for your heart were normal.  You were also found to have a urinary tract infection, so you are taking antibiotics (Keflex) for this.  You finished a course of antibiotics and this was fully treated.   Chest Pain (Nonspecific) It is often hard to give a specific diagnosis for the cause of chest pain. There is always a chance that your pain could be related to something serious, such as a heart attack or a blood clot in the lungs. You need to follow up with your health care provider for further evaluation. CAUSES   Heartburn.  Pneumonia or bronchitis.  Anxiety or stress.  Inflammation around your heart (pericarditis) or lung (pleuritis or pleurisy).  A blood clot in the lung.  A collapsed lung (pneumothorax). It can develop suddenly on its own (spontaneous pneumothorax) or from trauma to the chest.  Shingles infection (herpes zoster virus). The chest wall is composed of bones, muscles, and cartilage. Any of these can be the source of the pain.  The bones can be bruised by injury.  The muscles or cartilage can be strained by coughing or overwork.  The cartilage can be affected by inflammation and become sore (costochondritis). DIAGNOSIS  Lab tests or other studies may be needed to find the cause of your pain. Your health care provider may have you take a test called an ambulatory electrocardiogram (ECG). An ECG records your heartbeat patterns over a 24-hour period. You may also have other tests, such as:  Transthoracic echocardiogram (TTE). During echocardiography, sound waves are used to evaluate how blood flows through your heart.  Transesophageal echocardiogram (TEE).  Cardiac monitoring. This allows your health care provider to monitor your heart rate and rhythm in real time.  Holter monitor. This is a portable device that records your heartbeat and can help diagnose heart arrhythmias. It allows your health care provider to  track your heart activity for several days, if needed.  Stress tests by exercise or by giving medicine that makes the heart beat faster. TREATMENT   Treatment depends on what may be causing your chest pain. Treatment may include:  Acid blockers for heartburn.  Anti-inflammatory medicine.  Pain medicine for inflammatory conditions.  Antibiotics if an infection is present.  You may be advised to change lifestyle habits. This includes stopping smoking and avoiding alcohol, caffeine, and chocolate.  You may be advised to keep your head raised (elevated) when sleeping. This reduces the chance of acid going backward from your stomach into your esophagus. Most of the time, nonspecific chest pain will improve within 2-3 days with rest and mild pain medicine.  HOME CARE INSTRUCTIONS   If antibiotics were prescribed, take them as directed. Finish them even if you start to feel better.  For the next few days, avoid physical activities that bring on chest pain. Continue physical activities as directed.  Do not use any tobacco products, including cigarettes, chewing tobacco, or electronic cigarettes.  Avoid drinking alcohol.  Only take medicine as directed by your health care provider.  Follow your health care provider's suggestions for further testing if your chest pain does not go away.  Keep any follow-up appointments you made. If you do not go to an appointment, you could develop lasting (chronic) problems with pain. If there is any problem keeping an appointment, call to reschedule. SEEK MEDICAL CARE IF:   Your chest pain does not go away, even after treatment.  You have a rash  with blisters on your chest.  You have a fever. SEEK IMMEDIATE MEDICAL CARE IF:   You have increased chest pain or pain that spreads to your arm, neck, jaw, back, or abdomen.  You have shortness of breath.  You have an increasing cough, or you cough up blood.  You have severe back or abdominal  pain.  You feel nauseous or vomit.  You have severe weakness.  You faint.  You have chills. This is an emergency. Do not wait to see if the pain will go away. Get medical help at once. Call your local emergency services (911 in [place] Do not drive yourself to the hospital. MAKE SURE YOU:   Understand these instructions.  Will watch your condition.  Will get help right away if you are not doing well or get worse. Document Released: 05/21/2005 Document Revised: 08/16/2013 Document Reviewed: 03/16/2008 [facility] Patient Information 2015 [place], [place]. This information is not intended to replace advice given to you by your health care provider. Make sure you discuss any questions you have with your health care provider.     Urinary Tract Infection Urinary tract infections (UTIs) can develop anywhere along your urinary tract. Your urinary tract is your body's drainage system for removing wastes and extra water. Your urinary tract includes two kidneys, two ureters, a bladder, and a urethra. Your kidneys are a pair of bean-shaped organs. Each kidney is about the size of your fist. They are located below your ribs, one on each side of your spine. CAUSES Infections are caused by microbes, which are microscopic organisms, including fungi, viruses, and bacteria. These organisms are so small that they can only be seen through a microscope. Bacteria are the microbes that most commonly cause UTIs. SYMPTOMS  Symptoms of UTIs may vary by age and gender of the patient and by the location of the infection. Symptoms in young women typically include a frequent and intense urge to urinate and a painful, burning feeling in the bladder or urethra during urination. Older women and men are more likely to be tired, shaky, and weak and have muscle aches and abdominal pain. A fever may mean the infection is in your kidneys. Other symptoms of a kidney infection include pain in your back or sides below the ribs,  nausea, and vomiting. DIAGNOSIS To diagnose a UTI, your caregiver will ask you about your symptoms. Your caregiver also will ask to provide a urine sample. The urine sample will be tested for bacteria and white blood cells. White blood cells are made by your body to help fight infection. TREATMENT  Typically, UTIs can be treated with medication. Because most UTIs are caused by a bacterial infection, they usually can be treated with the use of antibiotics. The choice of antibiotic and length of treatment depend on your symptoms and the type of bacteria causing your infection. HOME CARE INSTRUCTIONS  If you were prescribed antibiotics, take them exactly as your caregiver instructs you. Finish the medication even if you feel better after you have only taken some of the medication.  Drink enough water and fluids to keep your urine clear or pale yellow.  Avoid caffeine, tea, and carbonated beverages. They tend to irritate your bladder.  Empty your bladder often. Avoid holding urine for long periods of time.  Empty your bladder before and after sexual intercourse.  After a bowel movement, women should cleanse from front to back. Use each tissue only once. SEEK MEDICAL CARE IF:   You have back pain.  You develop a fever.  Your symptoms do not begin to resolve within 3 days. SEEK IMMEDIATE MEDICAL CARE IF:   You have severe back pain or lower abdominal pain.  You develop chills.  You have nausea or vomiting.  You have continued burning or discomfort with urination. MAKE SURE YOU:   Understand these instructions.  Will watch your condition.  Will get help right away if you are not doing well or get worse. Document Released: 05/21/2005 Document Revised: 02/10/2012 Document Reviewed: 09/19/2011 [facility] Patient Information 2015 [place], [place]. This information is not intended to replace advice given to you by your health care provider. Make sure you discuss any questions you have with  your health care provider.

## 2014-03-21 NOTE — Clinical Documentation Improvement (Signed)
Possible Clinical Conditions?    Based on your clinical judgment, please clarify cause of Chest Pain:  Acute MI Unstable Angina/ACS Musculoskeletal Chest Pain Pleuritic Chest Pain/ Chest wall pain GERD Costochondritis Cholelithiasis / Cholecystitis Dressler's syndrome/Post Myocardial Infarction Syndrome Other Condition Cannot Clinically Determine    Supporting Information: Risk Factors: Gerd--possibly a component of Chest Pain per 7/26 progress notes.   Thank You, Theron Arista, Clinical Documentation Specialist:  [phone]  [facility] Information Management

## 2014-03-22 LAB — URINE CULTURE
Colony Count: >=100000
Special Requests: NORMAL

## 2014-03-22 NOTE — Discharge Summary (Signed)
I discussed with  Dr Bacigalupo.  I agree with their plans documented in their progress note.  

## 2014-03-27 ENCOUNTER — Ambulatory Visit: Payer: Medicare Other | Admitting: Physical Therapy

## 2014-04-03 ENCOUNTER — Telehealth: Payer: Self-pay | Admitting: Family Medicine

## 2014-04-03 NOTE — Telephone Encounter (Signed)
appt scheduled for Monday with Dr. Laurance Flatten at 11:30

## 2014-04-04 ENCOUNTER — Ambulatory Visit: Payer: Medicare Other | Attending: Family Medicine | Admitting: Physical Therapy

## 2014-04-04 DIAGNOSIS — M6281 Muscle weakness (generalized): Secondary | ICD-10-CM | POA: Diagnosis not present

## 2014-04-04 DIAGNOSIS — R269 Unspecified abnormalities of gait and mobility: Secondary | ICD-10-CM | POA: Insufficient documentation

## 2014-04-04 DIAGNOSIS — R5381 Other malaise: Secondary | ICD-10-CM | POA: Insufficient documentation

## 2014-04-04 DIAGNOSIS — IMO0001 Reserved for inherently not codable concepts without codable children: Secondary | ICD-10-CM | POA: Insufficient documentation

## 2014-04-06 ENCOUNTER — Ambulatory Visit: Payer: Medicare Other | Admitting: *Deleted

## 2014-04-06 DIAGNOSIS — IMO0001 Reserved for inherently not codable concepts without codable children: Secondary | ICD-10-CM | POA: Diagnosis not present

## 2014-04-07 ENCOUNTER — Telehealth: Payer: Self-pay | Admitting: Cardiology

## 2014-04-07 NOTE — Telephone Encounter (Signed)
Closed encounter °

## 2014-04-10 ENCOUNTER — Ambulatory Visit (INDEPENDENT_AMBULATORY_CARE_PROVIDER_SITE_OTHER): Payer: Medicare Other | Admitting: Family Medicine

## 2014-04-10 ENCOUNTER — Encounter: Payer: Self-pay | Admitting: Family Medicine

## 2014-04-10 VITALS — BP 130/77 | HR 76 | Temp 97.8°F | Ht 62.0 in | Wt 178.0 lb

## 2014-04-10 DIAGNOSIS — R102 Pelvic and perineal pain: Secondary | ICD-10-CM

## 2014-04-10 DIAGNOSIS — N390 Urinary tract infection, site not specified: Secondary | ICD-10-CM | POA: Diagnosis not present

## 2014-04-10 DIAGNOSIS — Z09 Encounter for follow-up examination after completed treatment for conditions other than malignant neoplasm: Secondary | ICD-10-CM | POA: Diagnosis not present

## 2014-04-10 DIAGNOSIS — N9489 Other specified conditions associated with female genital organs and menstrual cycle: Secondary | ICD-10-CM | POA: Diagnosis not present

## 2014-04-10 DIAGNOSIS — K219 Gastro-esophageal reflux disease without esophagitis: Secondary | ICD-10-CM | POA: Diagnosis not present

## 2014-04-10 DIAGNOSIS — E785 Hyperlipidemia, unspecified: Secondary | ICD-10-CM

## 2014-04-10 DIAGNOSIS — I2510 Atherosclerotic heart disease of native coronary artery without angina pectoris: Secondary | ICD-10-CM | POA: Diagnosis not present

## 2014-04-10 DIAGNOSIS — M1990 Unspecified osteoarthritis, unspecified site: Secondary | ICD-10-CM

## 2014-04-10 DIAGNOSIS — I10 Essential (primary) hypertension: Secondary | ICD-10-CM

## 2014-04-10 LAB — POCT CBC
GRANULOCYTE PERCENT: 63.7 % (ref 37–80)
HEMATOCRIT: 39.6 % (ref 37.7–47.9)
Hemoglobin: 12.9 g/dL (ref 12.2–16.2)
Lymph, poc: 2.8 (ref 0.6–3.4)
MCH, POC: 29.7 pg (ref 27–31.2)
MCHC: 32.5 g/dL (ref 31.8–35.4)
MCV: 91.2 fL (ref 80–97)
MPV: 7 fL (ref 0–99.8)
PLATELET COUNT, POC: 338 10*3/uL (ref 142–424)
POC Granulocyte: 5.4 (ref 2–6.9)
POC LYMPH PERCENT: 32.9 %L (ref 10–50)
RBC: 4.3 M/uL (ref 4.04–5.48)
RDW, POC: 13.8 %
WBC: 8.5 10*3/uL (ref 4.6–10.2)

## 2014-04-10 LAB — POCT URINALYSIS DIPSTICK
BILIRUBIN UA: NEGATIVE
GLUCOSE UA: NEGATIVE
KETONES UA: NEGATIVE
Nitrite, UA: POSITIVE
Protein, UA: NEGATIVE
Spec Grav, UA: 1.01
Urobilinogen, UA: NEGATIVE
pH, UA: 6.5

## 2014-04-10 LAB — POCT UA - MICROSCOPIC ONLY
Casts, Ur, LPF, POC: NEGATIVE
Crystals, Ur, HPF, POC: NEGATIVE
Yeast, UA: NEGATIVE

## 2014-04-10 MED ORDER — SUCRALFATE 1 GM/10ML PO SUSP: ORAL | Status: DC

## 2014-04-10 MED ORDER — CIPROFLOXACIN HCL 500 MG PO TABS: 500.0000 mg | ORAL_TABLET | Freq: Two times a day (BID) | ORAL | Status: DC

## 2014-04-10 NOTE — Progress Notes (Signed)
Subjective:    Patient ID: Debra Phillips, female    DOB: 31-Jul-1929, 80 y.o.   MRN: [id number]  HPI Patient here today for hospital follow up. She was at [facility] about 2 weeks ago for chest pain and uti. She is still having trouble with the urine, for example vaginal pain, urinary odor, and urgency. She has a follow up appointment with cardiologist Dr Percival Spanish this Wednesday. The patient's CT scan and echocardiogram were reviewed. The CT scan did not show an aortic aneurysm. He echocardiogram said that she had a moderately dilated ascending aorta. The patient has seen the gastroenterologist but continues to have a sick nauseated feeling in her stomach. She is currently taking a PPI for this.        Patient Active Problem List   Diagnosis Date Noted  . Solitary pulmonary nodule 03/20/2014  . Chest pain 03/18/2014  . Osteoporotic compression fracture of spine with routine healing 03/08/2014  . Osteoporosis with fracture 03/08/2014  . Osteoporosis, post-menopausal 03/08/2014  . Precordial pain 02/16/2012  . PALPITATIONS 02/27/2010  . CAD 12/12/2009  . Aortic aneurysm of unspecified site without mention of rupture 06/27/2009  . DYSPNEA 06/27/2009  . DYSLIPIDEMIA 12/12/2008  . HYPERTENSION 12/12/2008  . GERD 12/12/2008  . OSTEOARTHRITIS 12/12/2008   Outpatient Encounter Prescriptions as of 04/10/2014  Medication Sig  . aspirin 81 MG tablet Take 81 mg by mouth every evening.   . Calcium Carb-Cholecalciferol 600-500 MG-UNIT CAPS Take 1 capsule by mouth every morning.  . Cholecalciferol 1000 UNITS tablet Take 1,000 Units by mouth daily.   . Cinnamon 500 MG capsule Take 500 mg by mouth daily.   . citalopram (CELEXA) 20 MG tablet Take 10 mg by mouth daily.  Marland Kitchen dexlansoprazole (DEXILANT) 60 MG capsule Take 60 mg by mouth daily.  Marland Kitchen diltiazem (CARDIZEM CD) 180 MG 24 hr capsule Take 180 mg by mouth every evening.  . Flaxseed, Linseed, (FLAX SEED OIL PO) Take 1 tablet by mouth  daily.  . furosemide (LASIX) 20 MG tablet Take 40 mg by mouth daily as needed for fluid or edema.  Marland Kitchen lisinopril-hydrochlorothiazide (PRINZIDE,ZESTORETIC) 20-12.5 MG per tablet Take 1 tablet by mouth daily.  . metFORMIN (GLUCOPHAGE) 500 MG tablet Take 500 mg by mouth 2 (two) times daily with a meal.  . multivitamin (THERAGRAN) per tablet Take 1 tablet by mouth daily.    . Omega-3 Fatty Acids (FISH OIL) 1000 MG CAPS Take 2,000 mg by mouth at bedtime.  Marland Kitchen omeprazole (PRILOSEC) 40 MG capsule Take 40 mg by mouth every morning.   Marland Kitchen PROLIA 60 MG/ML SOLN injection   . traMADol (ULTRAM) 50 MG tablet Take 50 mg by mouth at bedtime as needed for moderate pain.     Review of Systems  Constitutional: Negative.   HENT: Negative.   Eyes: Negative.   Respiratory: Negative.   Cardiovascular: Negative.   Gastrointestinal: Negative.   Endocrine: Negative.   Genitourinary: Positive for dysuria, urgency and pelvic pain.  Musculoskeletal: Negative.   Skin: Negative.   Allergic/Immunologic: Negative.   Neurological: Negative.   Hematological: Negative.   Psychiatric/Behavioral: Negative.        Objective:   Physical Exam  Nursing note and vitals reviewed. Constitutional: She is oriented to person, place, and time. She appears well-developed and well-nourished. No distress.  The patient is alert and younger looking than her stated age  HENT:  Head: Normocephalic and atraumatic.  Right Ear: External ear normal.  Left Ear: External ear normal.  Nose: Nose normal.  Mouth/Throat: Oropharynx is clear and moist.  Eyes: Conjunctivae and EOM are normal. Pupils are equal, round, and reactive to light. Right eye exhibits no discharge. Left eye exhibits no discharge. No scleral icterus.  Neck: Normal range of motion. Neck supple. No JVD present. No thyromegaly present.  No carotid bruits  Cardiovascular: Normal rate, regular rhythm, normal heart sounds and intact distal pulses.  Exam reveals no gallop and no  friction rub.   No murmur heard. At 72 per minute  Pulmonary/Chest: Effort normal and breath sounds normal. No respiratory distress. She has no wheezes. She has no rales. She exhibits no tenderness.  Abdominal: Soft. Bowel sounds are normal. She exhibits no mass. There is tenderness. There is no rebound and no guarding.  There is both left and right upper quadrant tenderness and epigastric tenderness. There is also suprapubic tenderness.  Musculoskeletal: Normal range of motion. She exhibits no edema and no tenderness.  The patient has limited range of motion due to the back pain with arising from a supine position and going to a supine position.  Lymphadenopathy:    She has no cervical adenopathy.  Neurological: She is alert and oriented to person, place, and time. She has normal reflexes. No cranial nerve deficit.  Skin: Skin is warm and dry. No rash noted.  Psychiatric: She has a normal mood and affect. Her behavior is normal. Judgment and thought content normal.   BP 130/77  Pulse 76  Temp(Src) 97.8 F (36.6 C) (Oral)  Ht 5\' 2"  (1.575 m)  Wt 178 lb (80.74 kg)  BMI 32.55 kg/m2  Results for orders placed in visit on 04/10/14  POCT UA - MICROSCOPIC ONLY      Result Value Ref Range   WBC, Ur, HPF, POC 20-30     RBC, urine, microscopic 5-10     Bacteria, U Microscopic mod     Mucus, UA rare     Epithelial cells, urine per micros occ     Crystals, Ur, HPF, POC neg     Casts, Ur, LPF, POC neg     Yeast, UA neg    POCT URINALYSIS DIPSTICK      Result Value Ref Range   Color, UA yellow     Clarity, UA cloudy     Glucose, UA neg     Bilirubin, UA neg     Ketones, UA neg     Spec Grav, UA 1.010     Blood, UA trace     pH, UA 6.5     Protein, UA neg     Urobilinogen, UA negative     Nitrite, UA pos     Leukocytes, UA large (3+)     The patient was made aware of this urinalysis result before she left the office. She understands that a culture and sensitivity will be done.       Assessment & Plan:  1. CAD  2. DYSLIPIDEMIA  3. Gastroesophageal reflux disease, esophagitis presence not specified  4. HYPERTENSION  5. OSTEOARTHRITIS  6. Urinary tract infection, site not specified - POCT UA - Microscopic Only - POCT urinalysis dipstick - Urine culture  7. Frequent UTI - Ambulatory referral to Urology  8. Pelvic pressure in female - Ambulatory referral to Urology  9. Hospital discharge follow-up  10. Urinary tract infection without hematuria, site unspecified   Meds ordered this encounter  Medications  . sucralfate (CARAFATE) 1 GM/10ML suspension    Sig: 2 teaspoons before lunch,  dinner and bedtime.    Dispense:  420 mL    Refill:  1  . ciprofloxacin (CIPRO) 500 MG tablet    Sig: Take 1 tablet (500 mg total) by mouth 2 (two) times daily.    Dispense:  6 tablet    Refill:  0   Patient Instructions  We will schedule you for a visit with the urologist because of your continued bladder problems and infections. In the meantime, drink plenty of water Take the prescribed antibiotic For the continued nausea take carafate in addition to the PPI We will also do a urine culture and sensitivity on the urine specimen that you left with [place] today.   Arrie Senate MD

## 2014-04-10 NOTE — Patient Instructions (Signed)
We will schedule you for a visit with the urologist because of your continued bladder problems and infections. In the meantime, drink plenty of water Take the prescribed antibiotic For the continued nausea take carafate in addition to the PPI We will also do a urine culture and sensitivity on the urine specimen that you left with [place] today.

## 2014-04-10 NOTE — Addendum Note (Signed)
Addended by: Zannie Cove on: 04/10/2014 12:29 PM   Modules accepted: Orders

## 2014-04-11 ENCOUNTER — Ambulatory Visit: Payer: Medicare Other | Admitting: *Deleted

## 2014-04-11 DIAGNOSIS — IMO0001 Reserved for inherently not codable concepts without codable children: Secondary | ICD-10-CM | POA: Diagnosis not present

## 2014-04-12 ENCOUNTER — Encounter: Payer: Self-pay | Admitting: Cardiology

## 2014-04-12 ENCOUNTER — Other Ambulatory Visit: Payer: Self-pay | Admitting: Family Medicine

## 2014-04-12 ENCOUNTER — Ambulatory Visit (INDEPENDENT_AMBULATORY_CARE_PROVIDER_SITE_OTHER): Payer: Medicare Other | Admitting: Cardiology

## 2014-04-12 VITALS — BP 137/90 | HR 83 | Ht 64.0 in | Wt 173.0 lb

## 2014-04-12 DIAGNOSIS — R072 Precordial pain: Secondary | ICD-10-CM | POA: Diagnosis not present

## 2014-04-12 DIAGNOSIS — I2510 Atherosclerotic heart disease of native coronary artery without angina pectoris: Secondary | ICD-10-CM

## 2014-04-12 NOTE — Patient Instructions (Signed)
The current medical regimen is effective;  continue present plan and medications.  Follow up in 2 years with Dr. Percival Spanish in [place].  You will receive a letter in the mail 2 months before you are due.  Please call us when you receive this letter to schedule your follow up appointment.

## 2014-04-12 NOTE — Progress Notes (Signed)
HPI The patient presents for followup after recent hospitalization for chest pain. She ruled out for myocardial infarction.  She did have a stress perfusion study which demonstrated an EF of 76% with possible mild ischemia in the distal anterior wall versus artifact.  She did have a chest CT and there was no mention of any aortic aneurysm.  She had an echocardiogram and I reviewed this as well. This demonstrated that the aorta was 34 mm. I did review all of the hospital records. She continues to have multiple aches and pains that she has described over the years including chest discomfort. However, she has lots of problems with her esophagus and GI discomfort. She has lots of back pain. She's not describing any classic substernal chest pressure, neck or arm discomfort. She's not had any new palpitations, presyncope or syncope. She has no PND or orthopnea. She's had no weight gain or edema.   Allergies  Allergen Reactions  . Codeine Nausea And Vomiting  . Doxycycline Hives  . Other Nausea Only    Almost all antibiotics  . Sulfonamide Derivatives Hives  . Vancomycin Itching    Current Outpatient Prescriptions  Medication Sig Dispense Refill  . aspirin 81 MG tablet Take 81 mg by mouth every evening.       . Calcium Carb-Cholecalciferol 600-500 MG-UNIT CAPS Take 1 capsule by mouth every morning.      . Cholecalciferol 1000 UNITS tablet Take 1,000 Units by mouth daily.       . Cinnamon 500 MG capsule Take 500 mg by mouth daily.       . ciprofloxacin (CIPRO) 500 MG tablet Take 1 tablet (500 mg total) by mouth 2 (two) times daily.  6 tablet  0  . citalopram (CELEXA) 20 MG tablet Take 10 mg by mouth daily.      Marland Kitchen dexlansoprazole (DEXILANT) 60 MG capsule Take 60 mg by mouth daily.      Marland Kitchen diltiazem (CARDIZEM CD) 180 MG 24 hr capsule Take 180 mg by mouth every evening.      . Flaxseed, Linseed, (FLAX SEED OIL PO) Take 1 tablet by mouth daily.      . furosemide (LASIX) 20 MG tablet Take 40 mg by  mouth daily as needed for fluid or edema.      Marland Kitchen lisinopril-hydrochlorothiazide (PRINZIDE,ZESTORETIC) 20-12.5 MG per tablet Take 1 tablet by mouth daily.      . metFORMIN (GLUCOPHAGE) 500 MG tablet Take 500 mg by mouth 2 (two) times daily with a meal.      . multivitamin (THERAGRAN) per tablet Take 1 tablet by mouth daily.        . Omega-3 Fatty Acids (FISH OIL) 1000 MG CAPS Take 2,000 mg by mouth at bedtime.      Marland Kitchen omeprazole (PRILOSEC) 40 MG capsule Take 40 mg by mouth every morning.       Marland Kitchen PROLIA 60 MG/ML SOLN injection       . sucralfate (CARAFATE) 1 GM/10ML suspension 2 teaspoons before lunch, dinner and bedtime.  420 mL  1  . traMADol (ULTRAM) 50 MG tablet Take 50 mg by mouth at bedtime as needed for moderate pain.        No current facility-administered medications for this visit.    Past Medical History  Diagnosis Date  . Hypertension   . Esophageal stricture     s/p dilation  . Osteoarthritis   . GERD (gastroesophageal reflux disease)   . Breast cyst  Right  . Dyslipidemia   . Aortic root dilatation     Mild  . Diabetes mellitus without complication   . Hyperlipidemia   . Osteoporosis     Past Surgical History  Procedure Laterality Date  . Partial hysterectomy    . Total knee arthroplasty      Left  . Shoulder surgery      Right  . Knee arthroscopy  2004    Left and right  . Lumbar fusion  2000,2003  . Radial head arthroplasty  5/12    orif rt radial head  . Back surgery    . Abdominal hysterectomy    . Appendectomy    . Cholecystectomy  1993  . Cardiac catheterization  2010    both cataracts  . Fracture surgery      rt wrist2010  . Kyphosis surgery    . Hardware removal  12/10/2011    Procedure: HARDWARE REMOVAL;  Surgeon: Schuyler Amor, MD;  Location: [facility];  Service: Orthopedics;  Laterality: Right;  radial head    ROS:  As stated in the HPI and negative for all other systems.  PHYSICAL EXAM BP 137/90  Pulse 83  Ht 5'  4" (1.626 m)  Wt 173 lb (78.472 kg)  BMI 29.68 kg/m2 GENERAL:  Well appearing HEENT:  Pupils equal round and reactive, fundi not visualized, oral mucosa unremarkable, dentures NECK:  No jugular venous distention, waveform within normal limits, carotid upstroke brisk and symmetric, no bruits, no thyromegaly LYMPHATICS:  No cervical, inguinal adenopathy LUNGS:  Clear to auscultation bilaterally BACK:  No CVA tenderness CHEST:  Unremarkable HEART:  PMI not displaced or sustained,S1 and S2 within normal limits, no S3, no S4, no clicks, no rubs, no murmurs ABD:  Flat, positive bowel sounds normal in frequency in pitch, no bruits, no rebound, no guarding, no midline pulsatile mass, no hepatomegaly, no splenomegaly EXT:  2 plus pulses throughout, no edema, no cyanosis no clubbing SKIN:  No rashes no nodules NEURO:  Cranial nerves II through XII grossly intact, motor grossly intact throughout PSYCH:  Cognitively intact, oriented to person place and time   ASSESSMENT AND PLAN  CHEST PAIN:  She's had a thorough workup. No further evaluation is warranted. Her pain seems to be nonanginal.  AORTIC ANEURYSM:  She has a mildly dilated aortic root and this can be followed in a couple of years.  HTN:  Her blood pressure is well controlled. She will continue the meds as listed.  All hospital records and imaging reviewed.

## 2014-04-13 ENCOUNTER — Ambulatory Visit: Payer: Medicare Other | Admitting: Physical Therapy

## 2014-04-13 DIAGNOSIS — IMO0001 Reserved for inherently not codable concepts without codable children: Secondary | ICD-10-CM | POA: Diagnosis not present

## 2014-04-13 LAB — URINE CULTURE

## 2014-04-18 ENCOUNTER — Other Ambulatory Visit: Payer: Self-pay | Admitting: Urology

## 2014-04-18 ENCOUNTER — Ambulatory Visit: Payer: Medicare Other | Admitting: Physical Therapy

## 2014-04-18 DIAGNOSIS — N3020 Other chronic cystitis without hematuria: Secondary | ICD-10-CM | POA: Diagnosis not present

## 2014-04-18 DIAGNOSIS — IMO0001 Reserved for inherently not codable concepts without codable children: Secondary | ICD-10-CM | POA: Diagnosis not present

## 2014-04-18 DIAGNOSIS — N3090 Cystitis, unspecified without hematuria: Secondary | ICD-10-CM

## 2014-04-18 DIAGNOSIS — N952 Postmenopausal atrophic vaginitis: Secondary | ICD-10-CM | POA: Diagnosis not present

## 2014-04-20 ENCOUNTER — Ambulatory Visit: Payer: Medicare Other | Admitting: Physical Therapy

## 2014-04-20 DIAGNOSIS — IMO0001 Reserved for inherently not codable concepts without codable children: Secondary | ICD-10-CM | POA: Diagnosis not present

## 2014-04-25 ENCOUNTER — Ambulatory Visit: Payer: Medicare Other | Attending: Family Medicine | Admitting: *Deleted

## 2014-04-25 DIAGNOSIS — IMO0001 Reserved for inherently not codable concepts without codable children: Secondary | ICD-10-CM | POA: Diagnosis not present

## 2014-04-25 DIAGNOSIS — M6281 Muscle weakness (generalized): Secondary | ICD-10-CM | POA: Diagnosis not present

## 2014-04-25 DIAGNOSIS — R5381 Other malaise: Secondary | ICD-10-CM | POA: Diagnosis not present

## 2014-04-25 DIAGNOSIS — R269 Unspecified abnormalities of gait and mobility: Secondary | ICD-10-CM | POA: Diagnosis not present

## 2014-04-26 ENCOUNTER — Ambulatory Visit (INDEPENDENT_AMBULATORY_CARE_PROVIDER_SITE_OTHER): Payer: Medicare Other | Admitting: Family Medicine

## 2014-04-26 ENCOUNTER — Encounter: Payer: Self-pay | Admitting: Family Medicine

## 2014-04-26 VITALS — BP 128/70 | HR 64 | Temp 98.3°F | Ht 64.0 in | Wt 178.0 lb

## 2014-04-26 DIAGNOSIS — I2510 Atherosclerotic heart disease of native coronary artery without angina pectoris: Secondary | ICD-10-CM

## 2014-04-26 DIAGNOSIS — N3020 Other chronic cystitis without hematuria: Secondary | ICD-10-CM

## 2014-04-26 DIAGNOSIS — I10 Essential (primary) hypertension: Secondary | ICD-10-CM

## 2014-04-26 DIAGNOSIS — K219 Gastro-esophageal reflux disease without esophagitis: Secondary | ICD-10-CM | POA: Diagnosis not present

## 2014-04-26 DIAGNOSIS — E785 Hyperlipidemia, unspecified: Secondary | ICD-10-CM

## 2014-04-26 DIAGNOSIS — E119 Type 2 diabetes mellitus without complications: Secondary | ICD-10-CM | POA: Insufficient documentation

## 2014-04-26 DIAGNOSIS — N952 Postmenopausal atrophic vaginitis: Secondary | ICD-10-CM | POA: Insufficient documentation

## 2014-04-26 DIAGNOSIS — E559 Vitamin D deficiency, unspecified: Secondary | ICD-10-CM | POA: Diagnosis not present

## 2014-04-26 LAB — POCT GLYCOSYLATED HEMOGLOBIN (HGB A1C): Hemoglobin A1C: 6.6

## 2014-04-26 NOTE — Patient Instructions (Addendum)
Medicare Annual Wellness Visit  [facility] and the medical providers at [facility] strive to bring you the best medical care.  In doing so we not only want to address your current medical conditions and concerns but also to detect new conditions early and prevent illness, disease and health-related problems.    Medicare offers a yearly Wellness Visit which allows our clinical staff to assess your need for preventative services including immunizations, lifestyle education, counseling to decrease risk of preventable diseases and screening for fall risk and other medical concerns.    This visit is provided free of charge (no copay) for all Medicare recipients. The clinical pharmacists at [facility] have begun to conduct these Wellness Visits which will also include a thorough review of all your medications.    As you primary medical provider recommend that you make an appointment for your Annual Wellness Visit if you have not done so already this year.  You may set up this appointment before you leave today or you may call back ([phone]) and schedule an appointment.  Please make sure when you call that you mention that you are scheduling your Annual Wellness Visit with the clinical pharmacist so that the appointment may be made for the proper length of time.       Continue current medications. Continue good therapeutic lifestyle changes which include good diet and exercise. Fall precautions discussed with patient. If an FOBT was given today- please return it to our front desk. If you are over 11 years old - you may need Prevnar 29 or the adult Pneumonia vaccine.  Flu Shots will be available at our office starting mid- September. Please call and schedule a FLU CLINIC APPOINTMENT.   We will discuss the Premarin cream with the urology office to try to get approval for this so that you can start using it Monitor blood sugars and  blood pressures at home if possible We will call you with the results of today's lab work when those results are available Continue with your physical therapy

## 2014-04-26 NOTE — Progress Notes (Signed)
Subjective:    Patient ID: Debra Phillips, female    DOB: 1928/10/27, 80 y.o.   MRN: [id number]  HPI Pt here for follow up and management of chronic medical problems. The patient continues to have arthralgias and back pain and she is going for therapy for this. She recently saw a cardiologist and he did confirm that she has some mild aortic root aneurysm and it can be followed again in a couple of years. She is due to get a hemoglobin A1c today. She is up-to-date on all of her other health maintenance parameters. It is important to note that the nodule on the chest x-ray turned out to be a sclerotic rib lesion. This was from a CT scan that was done on 03/20/2014.       Patient Active Problem List   Diagnosis Date Noted  . Solitary pulmonary nodule 03/20/2014  . Chest pain 03/18/2014  . Osteoporotic compression fracture of spine with routine healing 03/08/2014  . Osteoporosis with fracture 03/08/2014  . Osteoporosis, post-menopausal 03/08/2014  . Precordial pain 02/16/2012  . PALPITATIONS 02/27/2010  . CAD 12/12/2009  . Aortic aneurysm of unspecified site without mention of rupture 06/27/2009  . DYSPNEA 06/27/2009  . DYSLIPIDEMIA 12/12/2008  . HYPERTENSION 12/12/2008  . GERD 12/12/2008  . OSTEOARTHRITIS 12/12/2008   Outpatient Encounter Prescriptions as of 04/26/2014  Medication Sig  . aspirin 81 MG tablet Take 81 mg by mouth every evening.   . Calcium Carb-Cholecalciferol 600-500 MG-UNIT CAPS Take 1 capsule by mouth every morning.  . cephALEXin (KEFLEX) 250 MG capsule Take 1 capsule by mouth daily.  . Cholecalciferol 1000 UNITS tablet Take 1,000 Units by mouth daily.   . Cinnamon 500 MG capsule Take 500 mg by mouth daily.   . citalopram (CELEXA) 20 MG tablet Take 10 mg by mouth daily.  Marland Kitchen dexlansoprazole (DEXILANT) 60 MG capsule Take 60 mg by mouth daily.  Marland Kitchen diltiazem (CARDIZEM CD) 180 MG 24 hr capsule Take 180 mg by mouth every evening.  . Flaxseed, Linseed, (FLAX SEED OIL  PO) Take 1 tablet by mouth daily.  . furosemide (LASIX) 20 MG tablet Take 40 mg by mouth daily as needed for fluid or edema.  Marland Kitchen lisinopril-hydrochlorothiazide (PRINZIDE,ZESTORETIC) 20-12.5 MG per tablet Take 1 tablet by mouth daily.  . metFORMIN (GLUCOPHAGE) 500 MG tablet TAKE ONE TABLET BY MOUTH TWICE DAILY  . multivitamin (THERAGRAN) per tablet Take 1 tablet by mouth daily.    . Omega-3 Fatty Acids (FISH OIL) 1000 MG CAPS Take 2,000 mg by mouth at bedtime.  Marland Kitchen omeprazole (PRILOSEC) 40 MG capsule Take 40 mg by mouth every morning.   Marland Kitchen PROLIA 60 MG/ML SOLN injection   . sucralfate (CARAFATE) 1 GM/10ML suspension 2 teaspoons before lunch, dinner and bedtime.  . traMADol (ULTRAM) 50 MG tablet Take 50 mg by mouth at bedtime as needed for moderate pain.   . [DISCONTINUED] ciprofloxacin (CIPRO) 500 MG tablet Take 1 tablet (500 mg total) by mouth 2 (two) times daily.    Review of Systems  Constitutional: Negative.   HENT: Negative.   Eyes: Negative.   Respiratory: Negative.   Cardiovascular: Negative.   Gastrointestinal: Negative.   Endocrine: Negative.   Genitourinary: Negative.   Musculoskeletal: Positive for arthralgias (bilat hip pain and low back pain- going to therapy for this) and back pain.  Skin: Negative.   Allergic/Immunologic: Negative.   Neurological: Negative.   Hematological: Negative.   Psychiatric/Behavioral: Negative.        Objective:  Physical Exam  Nursing note and vitals reviewed. Constitutional: She is oriented to person, place, and time. She appears well-developed and well-nourished. No distress.  The patient is pleasant alert and cooperative  HENT:  Head: Normocephalic and atraumatic.  Right Ear: External ear normal.  Left Ear: External ear normal.  Nose: Nose normal.  Mouth/Throat: Oropharynx is clear and moist.  Eyes: Conjunctivae and EOM are normal. Pupils are equal, round, and reactive to light. Right eye exhibits no discharge. Left eye exhibits no  discharge. No scleral icterus.  Neck: Normal range of motion. Neck supple. No thyromegaly present.  Cardiovascular: Normal rate, regular rhythm, normal heart sounds and intact distal pulses.  Exam reveals no gallop and no friction rub.   No murmur heard. At 72 per minute  Pulmonary/Chest: Effort normal and breath sounds normal. No respiratory distress. She has no wheezes. She has no rales. She exhibits no tenderness.  Abdominal: Soft. Bowel sounds are normal. She exhibits no mass. There is no tenderness. There is no rebound and no guarding.  No abdominal bruits  Musculoskeletal: Normal range of motion. She exhibits no edema and no tenderness.  Lymphadenopathy:    She has no cervical adenopathy.  Neurological: She is alert and oriented to person, place, and time. She has normal reflexes. No cranial nerve deficit.  Skin: Skin is warm and dry. No rash noted.  Psychiatric: She has a normal mood and affect. Her behavior is normal. Judgment and thought content normal.   BP 128/70  Pulse 64  Temp(Src) 98.3 F (36.8 C) (Oral)  Ht 5\' 4"  (1.626 m)  Wt 178 lb (80.74 kg)  BMI 30.54 kg/m2        Assessment & Plan:  1. CAD  2. DYSLIPIDEMIA  3. HYPERTENSION - Hepatic function panel  4. Gastroesophageal reflux disease, esophagitis presence not specified  5. Type 2 diabetes mellitus without complication - POCT glycosylated hemoglobin (Hb A1C)  6. Vitamin D deficiency - Vit D  25 hydroxy (rtn osteoporosis monitoring)  7. Chronic cystitis  8. Atrophic vaginitis  Patient Instructions                       Medicare Annual Wellness Visit  [facility] and the medical providers at [facility] strive to bring you the best medical care.  In doing so we not only want to address your current medical conditions and concerns but also to detect new conditions early and prevent illness, disease and health-related problems.    Medicare offers a yearly Wellness Visit which  allows our clinical staff to assess your need for preventative services including immunizations, lifestyle education, counseling to decrease risk of preventable diseases and screening for fall risk and other medical concerns.    This visit is provided free of charge (no copay) for all Medicare recipients. The clinical pharmacists at [facility] have begun to conduct these Wellness Visits which will also include a thorough review of all your medications.    As you primary medical provider recommend that you make an appointment for your Annual Wellness Visit if you have not done so already this year.  You may set up this appointment before you leave today or you may call back ([phone]) and schedule an appointment.  Please make sure when you call that you mention that you are scheduling your Annual Wellness Visit with the clinical pharmacist so that the appointment may be made for the proper length of time.  Continue current medications. Continue good therapeutic lifestyle changes which include good diet and exercise. Fall precautions discussed with patient. If an FOBT was given today- please return it to our front desk. If you are over 66 years old - you may need Prevnar 4 or the adult Pneumonia vaccine.  Flu Shots will be available at our office starting mid- September. Please call and schedule a FLU CLINIC APPOINTMENT.   We will discuss the Premarin cream with the urology office to try to get approval for this so that you can start using it Monitor blood sugars and blood pressures at home if possible We will call you with the results of today's lab work when those results are available Continue with your physical therapy   Arrie Senate MD

## 2014-04-27 ENCOUNTER — Ambulatory Visit: Payer: Medicare Other | Admitting: *Deleted

## 2014-04-27 DIAGNOSIS — IMO0001 Reserved for inherently not codable concepts without codable children: Secondary | ICD-10-CM | POA: Diagnosis not present

## 2014-04-27 LAB — HEPATIC FUNCTION PANEL
ALBUMIN: 4.3 g/dL (ref 3.5–4.7)
ALT: 15 IU/L (ref 0–32)
AST: 30 IU/L (ref 0–40)
Alkaline Phosphatase: 71 IU/L (ref 39–117)
BILIRUBIN DIRECT: 0.05 mg/dL (ref 0.00–0.40)
TOTAL PROTEIN: 6.7 g/dL (ref 6.0–8.5)
Total Bilirubin: 0.2 mg/dL (ref 0.0–1.2)

## 2014-04-27 LAB — VITAMIN D 25 HYDROXY (VIT D DEFICIENCY, FRACTURES): Vit D, 25-Hydroxy: 45.6 ng/mL (ref 30.0–100.0)

## 2014-05-02 ENCOUNTER — Other Ambulatory Visit: Payer: Self-pay | Admitting: *Deleted

## 2014-05-02 ENCOUNTER — Encounter: Payer: Self-pay | Admitting: *Deleted

## 2014-05-02 ENCOUNTER — Telehealth: Payer: Self-pay | Admitting: *Deleted

## 2014-05-02 NOTE — Progress Notes (Signed)
Patient ID: Debra Phillips, female   DOB: 11/02/1928, 80 y.o.   MRN: [id number] Insurance co deniied premarin vag cream and instead suggested estrace.  Dr. Denton Lank officxe had ordered the premarin and they changed it to  Estrace cream.

## 2014-05-03 ENCOUNTER — Ambulatory Visit: Payer: Medicare Other | Admitting: *Deleted

## 2014-05-03 DIAGNOSIS — IMO0001 Reserved for inherently not codable concepts without codable children: Secondary | ICD-10-CM | POA: Diagnosis not present

## 2014-05-09 ENCOUNTER — Ambulatory Visit: Payer: Medicare Other | Admitting: *Deleted

## 2014-05-09 DIAGNOSIS — IMO0001 Reserved for inherently not codable concepts without codable children: Secondary | ICD-10-CM | POA: Diagnosis not present

## 2014-05-10 DIAGNOSIS — Z23 Encounter for immunization: Secondary | ICD-10-CM | POA: Diagnosis not present

## 2014-05-11 ENCOUNTER — Ambulatory Visit: Payer: Medicare Other | Admitting: *Deleted

## 2014-05-11 DIAGNOSIS — IMO0001 Reserved for inherently not codable concepts without codable children: Secondary | ICD-10-CM | POA: Diagnosis not present

## 2014-05-15 NOTE — Telephone Encounter (Signed)
No action needed

## 2014-05-16 ENCOUNTER — Ambulatory Visit: Payer: Medicare Other | Admitting: *Deleted

## 2014-05-18 ENCOUNTER — Encounter: Payer: Medicare Other | Admitting: *Deleted

## 2014-05-18 DIAGNOSIS — IMO0002 Reserved for concepts with insufficient information to code with codable children: Secondary | ICD-10-CM | POA: Diagnosis not present

## 2014-05-18 DIAGNOSIS — M48061 Spinal stenosis, lumbar region without neurogenic claudication: Secondary | ICD-10-CM | POA: Diagnosis not present

## 2014-05-18 DIAGNOSIS — M47817 Spondylosis without myelopathy or radiculopathy, lumbosacral region: Secondary | ICD-10-CM | POA: Diagnosis not present

## 2014-05-19 ENCOUNTER — Ambulatory Visit: Payer: Medicare Other | Admitting: *Deleted

## 2014-05-22 ENCOUNTER — Telehealth: Payer: Self-pay | Admitting: Family Medicine

## 2014-05-22 NOTE — Telephone Encounter (Signed)
Already in chart.

## 2014-05-23 ENCOUNTER — Ambulatory Visit: Payer: Medicare Other | Admitting: Physical Therapy

## 2014-05-23 DIAGNOSIS — IMO0001 Reserved for inherently not codable concepts without codable children: Secondary | ICD-10-CM | POA: Diagnosis not present

## 2014-05-25 ENCOUNTER — Ambulatory Visit: Payer: Medicare Other | Attending: Family Medicine | Admitting: Physical Therapy

## 2014-05-25 DIAGNOSIS — M6281 Muscle weakness (generalized): Secondary | ICD-10-CM | POA: Insufficient documentation

## 2014-05-25 DIAGNOSIS — R5381 Other malaise: Secondary | ICD-10-CM | POA: Diagnosis not present

## 2014-05-25 DIAGNOSIS — Z5189 Encounter for other specified aftercare: Secondary | ICD-10-CM | POA: Insufficient documentation

## 2014-05-25 DIAGNOSIS — R269 Unspecified abnormalities of gait and mobility: Secondary | ICD-10-CM | POA: Diagnosis not present

## 2014-05-30 ENCOUNTER — Ambulatory Visit: Payer: Medicare Other | Admitting: Physical Therapy

## 2014-05-30 ENCOUNTER — Other Ambulatory Visit: Payer: Self-pay | Admitting: Family Medicine

## 2014-05-30 DIAGNOSIS — Z5189 Encounter for other specified aftercare: Secondary | ICD-10-CM | POA: Diagnosis not present

## 2014-06-08 DIAGNOSIS — N3941 Urge incontinence: Secondary | ICD-10-CM | POA: Diagnosis not present

## 2014-06-08 DIAGNOSIS — N952 Postmenopausal atrophic vaginitis: Secondary | ICD-10-CM | POA: Diagnosis not present

## 2014-06-08 DIAGNOSIS — D3000 Benign neoplasm of unspecified kidney: Secondary | ICD-10-CM | POA: Diagnosis not present

## 2014-06-08 DIAGNOSIS — N3020 Other chronic cystitis without hematuria: Secondary | ICD-10-CM | POA: Diagnosis not present

## 2014-06-12 DIAGNOSIS — M545 Low back pain: Secondary | ICD-10-CM | POA: Diagnosis not present

## 2014-06-12 DIAGNOSIS — M47816 Spondylosis without myelopathy or radiculopathy, lumbar region: Secondary | ICD-10-CM | POA: Diagnosis not present

## 2014-06-12 DIAGNOSIS — Z981 Arthrodesis status: Secondary | ICD-10-CM | POA: Diagnosis not present

## 2014-06-12 DIAGNOSIS — Z9889 Other specified postprocedural states: Secondary | ICD-10-CM | POA: Diagnosis not present

## 2014-06-12 DIAGNOSIS — M4806 Spinal stenosis, lumbar region: Secondary | ICD-10-CM | POA: Diagnosis not present

## 2014-06-13 ENCOUNTER — Other Ambulatory Visit: Payer: Self-pay | Admitting: Family Medicine

## 2014-06-29 DIAGNOSIS — G8929 Other chronic pain: Secondary | ICD-10-CM | POA: Diagnosis not present

## 2014-06-29 DIAGNOSIS — M5416 Radiculopathy, lumbar region: Secondary | ICD-10-CM | POA: Diagnosis not present

## 2014-06-29 DIAGNOSIS — M4806 Spinal stenosis, lumbar region: Secondary | ICD-10-CM | POA: Diagnosis not present

## 2014-06-29 DIAGNOSIS — M47816 Spondylosis without myelopathy or radiculopathy, lumbar region: Secondary | ICD-10-CM | POA: Diagnosis not present

## 2014-07-05 ENCOUNTER — Telehealth: Payer: Self-pay | Admitting: Pharmacist

## 2014-07-05 NOTE — Telephone Encounter (Signed)
Patient called to let me know that she had decided to get Reclast.  Dr Carloyn Manner has set up for her to get at [facility].  She will let me know when she receives so that I can document.  I advised patient to take APAP 500mg  and drink a glass of water before and after infusion to lessen side effects.

## 2014-07-10 ENCOUNTER — Telehealth: Payer: Self-pay | Admitting: Family Medicine

## 2014-07-10 DIAGNOSIS — L0390 Cellulitis, unspecified: Secondary | ICD-10-CM | POA: Diagnosis not present

## 2014-07-10 NOTE — Telephone Encounter (Signed)
Stp, offered 6:15 appt, pt declined states she will go to the urgent care, will close encounter

## 2014-07-11 DIAGNOSIS — M810 Age-related osteoporosis without current pathological fracture: Secondary | ICD-10-CM | POA: Diagnosis not present

## 2014-07-15 ENCOUNTER — Other Ambulatory Visit: Payer: Self-pay | Admitting: Family Medicine

## 2014-07-17 NOTE — Telephone Encounter (Signed)
Refilled per protocol.

## 2014-09-01 ENCOUNTER — Encounter: Payer: Self-pay | Admitting: Family Medicine

## 2014-09-01 ENCOUNTER — Ambulatory Visit (INDEPENDENT_AMBULATORY_CARE_PROVIDER_SITE_OTHER): Payer: Medicare Other | Admitting: Family Medicine

## 2014-09-01 VITALS — BP 130/71 | HR 70 | Temp 97.1°F | Ht 64.0 in | Wt 178.0 lb

## 2014-09-01 DIAGNOSIS — M47816 Spondylosis without myelopathy or radiculopathy, lumbar region: Secondary | ICD-10-CM

## 2014-09-01 DIAGNOSIS — K219 Gastro-esophageal reflux disease without esophagitis: Secondary | ICD-10-CM

## 2014-09-01 DIAGNOSIS — I10 Essential (primary) hypertension: Secondary | ICD-10-CM

## 2014-09-01 DIAGNOSIS — E559 Vitamin D deficiency, unspecified: Secondary | ICD-10-CM

## 2014-09-01 DIAGNOSIS — M810 Age-related osteoporosis without current pathological fracture: Secondary | ICD-10-CM

## 2014-09-01 DIAGNOSIS — E119 Type 2 diabetes mellitus without complications: Secondary | ICD-10-CM | POA: Diagnosis not present

## 2014-09-01 LAB — POCT CBC
GRANULOCYTE PERCENT: 59.1 % (ref 37–80)
HCT, POC: 41.7 % (ref 37.7–47.9)
HEMOGLOBIN: 12.9 g/dL (ref 12.2–16.2)
LYMPH, POC: 2.4 (ref 0.6–3.4)
MCH, POC: 28.3 pg (ref 27–31.2)
MCHC: 31 g/dL — AB (ref 31.8–35.4)
MCV: 91.2 fL (ref 80–97)
MPV: 6.8 fL (ref 0–99.8)
POC Granulocyte: 4 (ref 2–6.9)
POC LYMPH %: 35.4 % (ref 10–50)
Platelet Count, POC: 334 10*3/uL (ref 142–424)
RBC: 4.6 M/uL (ref 4.04–5.48)
RDW, POC: 13.6 %
WBC: 6.7 10*3/uL (ref 4.6–10.2)

## 2014-09-01 LAB — POCT UA - MICROALBUMIN: Microalbumin Ur, POC: NEGATIVE mg/L

## 2014-09-01 LAB — POCT GLYCOSYLATED HEMOGLOBIN (HGB A1C)

## 2014-09-01 NOTE — Progress Notes (Signed)
Subjective:    Patient ID: Debra Phillips, female    DOB: Sep 13, 1928, 81 y.o.   MRN: [id number]  HPI Pt here for follow up and management of chronic medical problems which includes diabetes and hypertension. She is taking her medication regularly for these medical problems. Her blood pressure is under good control today. Her weight is stable. She is due to get lab work today and we will make recommendations to her following the lab work results that are obtained. The patient is in need of an eye exam and will schedule this she is also due to get a urine microalbumin. The patient has been living by herself for a good while and finds it necessary to get out of the house as much as possible and she is attempting to do this. She is getting Reclast injections for osteoporosis and she is due to get an eye exam.        Patient Active Problem List   Diagnosis Date Noted  . Diabetes 04/26/2014  . Vitamin D deficiency 04/26/2014  . Chronic cystitis 04/26/2014  . Atrophic vaginitis 04/26/2014  . Solitary pulmonary nodule 03/20/2014  . Chest pain 03/18/2014  . Osteoporotic compression fracture of spine with routine healing 03/08/2014  . Osteoporosis with fracture 03/08/2014  . Osteoporosis, post-menopausal 03/08/2014  . Precordial pain 02/16/2012  . PALPITATIONS 02/27/2010  . CAD 12/12/2009  . Aortic aneurysm of unspecified site without mention of rupture 06/27/2009  . DYSPNEA 06/27/2009  . DYSLIPIDEMIA 12/12/2008  . HYPERTENSION 12/12/2008  . GERD 12/12/2008  . OSTEOARTHRITIS 12/12/2008   Outpatient Encounter Prescriptions as of 09/01/2014  Medication Sig  . aspirin 81 MG tablet Take 81 mg by mouth every evening.   . Calcium Carb-Cholecalciferol 600-500 MG-UNIT CAPS Take 1 capsule by mouth every morning.  . Cholecalciferol 1000 UNITS tablet Take 1,000 Units by mouth daily.   . Cinnamon 500 MG capsule Take 500 mg by mouth daily.   Marland Kitchen dexlansoprazole (DEXILANT) 60 MG capsule Take 60 mg  by mouth daily.  Marland Kitchen diltiazem (CARDIZEM CD) 180 MG 24 hr capsule TAKE ONE CAPSULE BY MOUTH ONE TIME DAILY  . Flaxseed, Linseed, (FLAX SEED OIL PO) Take 1 tablet by mouth daily.  . furosemide (LASIX) 20 MG tablet Take 40 mg by mouth daily as needed for fluid or edema.  Marland Kitchen lisinopril-hydrochlorothiazide (PRINZIDE,ZESTORETIC) 20-12.5 MG per tablet TAKE ONE TABLET BY MOUTH ONE TIME DAILY  . metFORMIN (GLUCOPHAGE) 500 MG tablet TAKE ONE TABLET BY MOUTH TWICE DAILY  . multivitamin (THERAGRAN) per tablet Take 1 tablet by mouth daily.    . Omega-3 Fatty Acids (FISH OIL) 1000 MG CAPS Take 2,000 mg by mouth at bedtime.  Marland Kitchen omeprazole (PRILOSEC) 40 MG capsule Take 40 mg by mouth every morning.   . ONE TOUCH ULTRA TEST test strip USE TO CHECK BLOOD GLUCOSE ONCE DAILY  . PROLIA 60 MG/ML SOLN injection   . sucralfate (CARAFATE) 1 GM/10ML suspension 2 teaspoons before lunch, dinner and bedtime.  . traMADol (ULTRAM) 50 MG tablet Take 50 mg by mouth at bedtime as needed for moderate pain.   . [DISCONTINUED] cephALEXin (KEFLEX) 250 MG capsule Take 1 capsule by mouth daily.  . [DISCONTINUED] citalopram (CELEXA) 20 MG tablet Take 10 mg by mouth daily.    Review of Systems  Constitutional: Negative.   HENT: Negative.   Eyes: Negative.   Respiratory: Negative.   Cardiovascular: Negative.   Gastrointestinal: Negative.   Endocrine: Negative.   Genitourinary: Negative.   Musculoskeletal:  Negative.   Skin: Negative.   Allergic/Immunologic: Negative.   Neurological: Negative.   Hematological: Negative.   Psychiatric/Behavioral: Negative.        Objective:   Physical Exam  Constitutional: She is oriented to person, place, and time. She appears well-developed and well-nourished. No distress.  The patient is alert and in good spirits  HENT:  Head: Normocephalic and atraumatic.  Right Ear: External ear normal.  Left Ear: External ear normal.  Nose: Nose normal.  Mouth/Throat: Oropharynx is clear and moist.  No oropharyngeal exudate.  Eyes: Conjunctivae and EOM are normal. Pupils are equal, round, and reactive to light. Right eye exhibits no discharge. Left eye exhibits no discharge. No scleral icterus.  Neck: Normal range of motion. Neck supple. No thyromegaly present.  There were no carotid bruits or anterior cervical adenopathy  Cardiovascular: Normal rate, regular rhythm, normal heart sounds and intact distal pulses.  Exam reveals no gallop and no friction rub.   No murmur heard. At 72/m  Pulmonary/Chest: Effort normal and breath sounds normal. No respiratory distress. She has no wheezes. She has no rales. She exhibits no tenderness.  Abdominal: Soft. Bowel sounds are normal. She exhibits no mass. There is tenderness. There is no rebound and no guarding.  The abdomen was obese with epigastric tenderness and left upper quadrant tenderness  Musculoskeletal: Normal range of motion. She exhibits no edema or tenderness.  Lymphadenopathy:    She has no cervical adenopathy.  Neurological: She is alert and oriented to person, place, and time. She has normal reflexes. No cranial nerve deficit.  Skin: Skin is warm and dry. No rash noted.  Psychiatric: She has a normal mood and affect. Her behavior is normal. Judgment and thought content normal.  Nursing note and vitals reviewed.  BP 130/71 mmHg  Pulse 70  Temp(Src) 97.1 F (36.2 C) (Oral)  Ht '5\' 4"'  (1.626 m)  Wt 178 lb (80.74 kg)  BMI 30.54 kg/m2        Assessment & Plan:  1. Gastroesophageal reflux disease, esophagitis presence not specified -Continue omeprazole 40 once daily but if increased symptoms increase this to twice daily - POCT CBC - Hepatic function panel  2. Type 2 diabetes mellitus without complication -Monitor blood sugars as directed and stay as active as possible -Check feet regularly -Follow diet as closely as possible - POCT CBC - POCT glycosylated hemoglobin (Hb A1C) - POCT UA - Microalbumin  3. Vitamin D  deficiency -Continue current treatment and this may change depending on your upcoming lab results - POCT CBC - Vit D  25 hydroxy (rtn osteoporosis monitoring)  4. Essential hypertension -Continue current blood pressure medication and Lasix as needed for fluid - POCT CBC - BMP8+EGFR - Hepatic function panel - NMR, lipoprofile - POCT UA - Microalbumin  5. Osteoporosis, post-menopausal -Continue Reclast yearly and be careful and did not put yourself at risk for falling  6. Spondylosis of lumbar region without myelopathy or radiculopathy -Stay as active physically as possible and avoid heavy lifting pushing or pulling  Patient Instructions                       Medicare Annual Wellness Visit  [facility] and the medical providers at [facility] strive to bring you the best medical care.  In doing so we not only want to address your current medical conditions and concerns but also to detect new conditions early and prevent illness, disease and health-related problems.  Medicare offers a yearly Wellness Visit which allows our clinical staff to assess your need for preventative services including immunizations, lifestyle education, counseling to decrease risk of preventable diseases and screening for fall risk and other medical concerns.    This visit is provided free of charge (no copay) for all Medicare recipients. The clinical pharmacists at [facility] have begun to conduct these Wellness Visits which will also include a thorough review of all your medications.    As you primary medical provider recommend that you make an appointment for your Annual Wellness Visit if you have not done so already this year.  You may set up this appointment before you leave today or you may call back ([phone]) and schedule an appointment.  Please make sure when you call that you mention that you are scheduling your Annual Wellness Visit with the clinical  pharmacist so that the appointment may be made for the proper length of time.     Continue current medications. Continue good therapeutic lifestyle changes which include good diet and exercise. Fall precautions discussed with patient. If an FOBT was given today- please return it to our front desk. If you are over 52 years old - you may need Prevnar 36 or the adult Pneumonia vaccine.  Flu Shots will be available at our office starting mid- September. Please call and schedule a FLU CLINIC APPOINTMENT.   Continue to be careful and do not put yourself at risk for falling Continue follow-ups as planned with the neurosurgeon, Dr. Carloyn Manner Continue Reclast injections Continue omeprazole for your reflux and take an extra one if the reflux seems to be increasing in frequency Continue monitoring blood pressures and blood sugars as directed today Check feet regularly bring blood sugars and blood pressure readings in for review Continue current medication for blood pressure and the fluid pill as needed    Arrie Senate MD

## 2014-09-01 NOTE — Patient Instructions (Addendum)
Medicare Annual Wellness Visit  [facility] and the medical providers at [facility] strive to bring you the best medical care.  In doing so we not only want to address your current medical conditions and concerns but also to detect new conditions early and prevent illness, disease and health-related problems.    Medicare offers a yearly Wellness Visit which allows our clinical staff to assess your need for preventative services including immunizations, lifestyle education, counseling to decrease risk of preventable diseases and screening for fall risk and other medical concerns.    This visit is provided free of charge (no copay) for all Medicare recipients. The clinical pharmacists at [facility] have begun to conduct these Wellness Visits which will also include a thorough review of all your medications.    As you primary medical provider recommend that you make an appointment for your Annual Wellness Visit if you have not done so already this year.  You may set up this appointment before you leave today or you may call back ([phone]) and schedule an appointment.  Please make sure when you call that you mention that you are scheduling your Annual Wellness Visit with the clinical pharmacist so that the appointment may be made for the proper length of time.     Continue current medications. Continue good therapeutic lifestyle changes which include good diet and exercise. Fall precautions discussed with patient. If an FOBT was given today- please return it to our front desk. If you are over 47 years old - you may need Prevnar 17 or the adult Pneumonia vaccine.  Flu Shots will be available at our office starting mid- September. Please call and schedule a FLU CLINIC APPOINTMENT.   Continue to be careful and do not put yourself at risk for falling Continue follow-ups as planned with the neurosurgeon, Dr. Carloyn Manner Continue Reclast  injections Continue omeprazole for your reflux and take an extra one if the reflux seems to be increasing in frequency Continue monitoring blood pressures and blood sugars as directed today Check feet regularly bring blood sugars and blood pressure readings in for review Continue current medication for blood pressure and the fluid pill as needed

## 2014-09-02 LAB — BMP8+EGFR
BUN/Creatinine Ratio: 14 (ref 11–26)
BUN: 12 mg/dL (ref 8–27)
CO2: 26 mmol/L (ref 18–29)
Calcium: 9.7 mg/dL (ref 8.7–10.3)
Chloride: 97 mmol/L (ref 97–108)
Creatinine, Ser: 0.87 mg/dL (ref 0.57–1.00)
GFR, EST AFRICAN AMERICAN: 71 mL/min/{1.73_m2} (ref >59)
GFR, EST NON AFRICAN AMERICAN: 62 mL/min/{1.73_m2} (ref >59)
GLUCOSE: 147 mg/dL — AB (ref 65–99)
Potassium: 4.9 mmol/L (ref 3.5–5.2)
Sodium: 137 mmol/L (ref 134–144)

## 2014-09-02 LAB — HEPATIC FUNCTION PANEL
ALBUMIN: 4.2 g/dL (ref 3.5–4.7)
ALT: 15 IU/L (ref 0–32)
AST: 18 IU/L (ref 0–40)
Alkaline Phosphatase: 71 IU/L (ref 39–117)
BILIRUBIN DIRECT: 0.09 mg/dL (ref 0.00–0.40)
BILIRUBIN TOTAL: 0.3 mg/dL (ref 0.0–1.2)
Total Protein: 6.7 g/dL (ref 6.0–8.5)

## 2014-09-02 LAB — NMR, LIPOPROFILE
CHOLESTEROL: 220 mg/dL — AB (ref 100–199)
HDL CHOLESTEROL BY NMR: 39 mg/dL — AB (ref >39)
HDL Particle Number: 35.4 umol/L (ref >=30.5)
LDL Particle Number: 1815 nmol/L — ABNORMAL HIGH (ref <1000)
LDL Size: 20.1 nm (ref >20.5)
LDL-C: 135 mg/dL — AB (ref 0–99)
LP-IR SCORE: 55 — AB (ref <=45)
Small LDL Particle Number: 1136 nmol/L — ABNORMAL HIGH (ref <=527)
Triglycerides by NMR: 232 mg/dL — ABNORMAL HIGH (ref 0–149)

## 2014-09-02 LAB — VITAMIN D 25 HYDROXY (VIT D DEFICIENCY, FRACTURES): VIT D 25 HYDROXY: 44.9 ng/mL (ref 30.0–100.0)

## 2014-09-05 ENCOUNTER — Telehealth: Payer: Self-pay | Admitting: *Deleted

## 2014-09-05 NOTE — Telephone Encounter (Signed)
Aware of results.  Atoravastatin 10 mg called to [facility] vm.  She knows she needs to f/u in a month to six weeks with lab work.

## 2014-09-09 ENCOUNTER — Other Ambulatory Visit: Payer: Self-pay | Admitting: Family Medicine

## 2014-09-18 DIAGNOSIS — E119 Type 2 diabetes mellitus without complications: Secondary | ICD-10-CM | POA: Diagnosis not present

## 2014-09-18 DIAGNOSIS — H40033 Anatomical narrow angle, bilateral: Secondary | ICD-10-CM | POA: Diagnosis not present

## 2014-09-28 DIAGNOSIS — N952 Postmenopausal atrophic vaginitis: Secondary | ICD-10-CM | POA: Diagnosis not present

## 2014-09-28 DIAGNOSIS — N3020 Other chronic cystitis without hematuria: Secondary | ICD-10-CM | POA: Diagnosis not present

## 2014-10-11 ENCOUNTER — Other Ambulatory Visit: Payer: Self-pay | Admitting: Family Medicine

## 2014-10-17 DIAGNOSIS — M1990 Unspecified osteoarthritis, unspecified site: Secondary | ICD-10-CM | POA: Diagnosis not present

## 2014-10-17 DIAGNOSIS — M19012 Primary osteoarthritis, left shoulder: Secondary | ICD-10-CM | POA: Diagnosis not present

## 2014-10-17 DIAGNOSIS — M4806 Spinal stenosis, lumbar region: Secondary | ICD-10-CM | POA: Diagnosis not present

## 2014-10-17 DIAGNOSIS — S060X9A Concussion with loss of consciousness of unspecified duration, initial encounter: Secondary | ICD-10-CM | POA: Diagnosis not present

## 2014-10-28 ENCOUNTER — Other Ambulatory Visit: Payer: Self-pay | Admitting: Family Medicine

## 2014-11-05 ENCOUNTER — Other Ambulatory Visit: Payer: Self-pay | Admitting: Family Medicine

## 2014-11-13 DIAGNOSIS — M3501 Sicca syndrome with keratoconjunctivitis: Secondary | ICD-10-CM | POA: Diagnosis not present

## 2014-11-13 DIAGNOSIS — H3531 Nonexudative age-related macular degeneration: Secondary | ICD-10-CM | POA: Diagnosis not present

## 2014-11-13 DIAGNOSIS — H01001 Unspecified blepharitis right upper eyelid: Secondary | ICD-10-CM | POA: Diagnosis not present

## 2014-11-13 DIAGNOSIS — E119 Type 2 diabetes mellitus without complications: Secondary | ICD-10-CM | POA: Diagnosis not present

## 2014-12-07 ENCOUNTER — Other Ambulatory Visit: Payer: Self-pay | Admitting: Family Medicine

## 2014-12-26 ENCOUNTER — Ambulatory Visit (INDEPENDENT_AMBULATORY_CARE_PROVIDER_SITE_OTHER): Payer: Medicare Other | Admitting: Nurse Practitioner

## 2014-12-26 ENCOUNTER — Encounter: Payer: Self-pay | Admitting: Nurse Practitioner

## 2014-12-26 VITALS — BP 145/75 | HR 82 | Temp 97.4°F | Ht 64.0 in | Wt 178.0 lb

## 2014-12-26 DIAGNOSIS — J301 Allergic rhinitis due to pollen: Secondary | ICD-10-CM

## 2014-12-26 MED ORDER — FLUTICASONE PROPIONATE 50 MCG/ACT NA SUSP: 2.0000 | Freq: Every day | NASAL | Status: DC

## 2014-12-26 NOTE — Progress Notes (Signed)
  Subjective:     Debra Phillips is a 81 y.o. female who presents for evaluation of sinus pain. Symptoms include: congestion, cough, itchy eyes, itchy nose, nasal congestion, sneezing and sore throat. Onset of symptoms was 3 days ago. Symptoms have been unchanged since that time. Past history is significant for no history of pneumonia or bronchitis. Patient is a non-smoker.  The following portions of the patient's history were reviewed and updated as appropriate: allergies, current medications, past family history, past medical history, past social history, past surgical history and problem list.  Review of Systems Pertinent items are noted in HPI.   Objective:    BP 145/75 mmHg  Pulse 82  Temp(Src) 97.4 F (36.3 C) (Oral)  Ht 5\' 4"  (1.626 m)  Wt 178 lb (80.74 kg)  BMI 30.54 kg/m2 General appearance: alert and cooperative Eyes: conjunctivae/corneas clear. PERRL, EOM's intact. Fundi benign. Ears: normal TM's and external ear canals both ears Nose: clear discharge, moderate congestion, turbinates red, no sinus tenderness Throat: lips, mucosa, and tongue normal; teeth and gums normal Neck: no adenopathy, no carotid bruit, no JVD, supple, symmetrical, trachea midline and thyroid not enlarged, symmetric, no tenderness/mass/nodules Lungs: clear to auscultation bilaterally Heart: regular rate and rhythm, S1, S2 normal, no murmur, click, rub or gallop    Assessment:    Allergic rhinitis   Plan:   1. Take meds as prescribed 2. Use a cool mist humidifier especially during the winter months and when heat has been humid. 3. Use saline nose sprays frequently 4. Saline irrigations of the nose can be very helpful if done frequently.  * 4X daily for 1 week*  * Use of a nettie pot can be helpful with this. Follow directions with this* 5. Drink plenty of fluids 6. Keep thermostat turn down low 7.For any cough or congestion  Use plain Mucinex- regular strength or max strength is fine   *  Children- consult with Pharmacist for dosing 8. For fever or aces or pains- take tylenol or ibuprofen appropriate for age and weight.  * for fevers greater than 101 orally you may alternate ibuprofen and tylenol every  3 hours.    Meds ordered this encounter  Medications  . fluticasone (FLONASE) 50 MCG/ACT nasal spray    Sig: Place 2 sprays into both nostrils daily.    Dispense:  16 g    Refill:  2    Order Specific Question:  Supervising Provider    Answer:  Chipper Herb [[id number]]   Mary-Margaret Hassell Done, FNP

## 2014-12-26 NOTE — Patient Instructions (Signed)

## 2015-01-01 ENCOUNTER — Encounter: Payer: Self-pay | Admitting: Family Medicine

## 2015-01-01 ENCOUNTER — Ambulatory Visit (INDEPENDENT_AMBULATORY_CARE_PROVIDER_SITE_OTHER): Payer: Medicare Other | Admitting: Family Medicine

## 2015-01-01 VITALS — BP 146/72 | HR 68 | Temp 97.0°F | Ht 64.0 in | Wt 178.0 lb

## 2015-01-01 DIAGNOSIS — R42 Dizziness and giddiness: Secondary | ICD-10-CM | POA: Diagnosis not present

## 2015-01-01 DIAGNOSIS — R5382 Chronic fatigue, unspecified: Secondary | ICD-10-CM | POA: Diagnosis not present

## 2015-01-01 DIAGNOSIS — K219 Gastro-esophageal reflux disease without esophagitis: Secondary | ICD-10-CM | POA: Diagnosis not present

## 2015-01-01 DIAGNOSIS — E559 Vitamin D deficiency, unspecified: Secondary | ICD-10-CM | POA: Diagnosis not present

## 2015-01-01 DIAGNOSIS — I10 Essential (primary) hypertension: Secondary | ICD-10-CM | POA: Diagnosis not present

## 2015-01-01 DIAGNOSIS — E119 Type 2 diabetes mellitus without complications: Secondary | ICD-10-CM | POA: Diagnosis not present

## 2015-01-01 LAB — POCT CBC
GRANULOCYTE PERCENT: 56.7 % (ref 37–80)
HEMATOCRIT: 39.5 % (ref 37.7–47.9)
Hemoglobin: 12.4 g/dL (ref 12.2–16.2)
Lymph, poc: 2.5 (ref 0.6–3.4)
MCH, POC: 28.2 pg (ref 27–31.2)
MCHC: 31.3 g/dL — AB (ref 31.8–35.4)
MCV: 90.2 fL (ref 80–97)
MPV: 7 fL (ref 0–99.8)
POC GRANULOCYTE: 3.6 (ref 2–6.9)
POC LYMPH PERCENT: 39.1 %L (ref 10–50)
Platelet Count, POC: 334 10*3/uL (ref 142–424)
RBC: 4.38 M/uL (ref 4.04–5.48)
RDW, POC: 14.1 %
WBC: 6.4 10*3/uL (ref 4.6–10.2)

## 2015-01-01 LAB — POCT GLYCOSYLATED HEMOGLOBIN (HGB A1C): Hemoglobin A1C: 7

## 2015-01-01 NOTE — Addendum Note (Signed)
Addended by: Zannie Cove on: 01/01/2015 11:34 AM   Modules accepted: Orders

## 2015-01-01 NOTE — Addendum Note (Signed)
Addended by: Selmer Dominion on: 01/01/2015 11:22 AM   Modules accepted: Orders

## 2015-01-01 NOTE — Progress Notes (Signed)
Subjective:    Patient ID: Debra Phillips, female    DOB: 05/05/1929, 81 y.o.   MRN: [id number]  HPI Pt here for follow up and management of chronic medical problems which includes hypertension, GERD and diabetes. She is taking medications regularly. The patient today has several complaints and these include seasonal allergies, some dizziness, and fatigue.     Patient Active Problem List   Diagnosis Date Noted  . Spondylosis of lumbar region without myelopathy or radiculopathy 09/01/2014  . Diabetes 04/26/2014  . Vitamin D deficiency 04/26/2014  . Chronic cystitis 04/26/2014  . Atrophic vaginitis 04/26/2014  . Solitary pulmonary nodule 03/20/2014  . Chest pain 03/18/2014  . Osteoporotic compression fracture of spine with routine healing 03/08/2014  . Osteoporosis with fracture 03/08/2014  . Osteoporosis, post-menopausal 03/08/2014  . Precordial pain 02/16/2012  . PALPITATIONS 02/27/2010  . CAD 12/12/2009  . Aortic aneurysm of unspecified site without mention of rupture 06/27/2009  . DYSPNEA 06/27/2009  . DYSLIPIDEMIA 12/12/2008  . Essential hypertension 12/12/2008  . GERD 12/12/2008  . OSTEOARTHRITIS 12/12/2008   Outpatient Encounter Prescriptions as of 01/01/2015  Medication Sig  . aspirin 81 MG tablet Take 81 mg by mouth every evening.   Marland Kitchen atorvastatin (LIPITOR) 10 MG tablet TAKE ONE TABLET BY MOUTH ONE TIME DAILY  . Calcium Carb-Cholecalciferol 600-500 MG-UNIT CAPS Take 1 capsule by mouth every morning.  . Cholecalciferol 1000 UNITS tablet Take 1,000 Units by mouth daily.   . Cinnamon 500 MG capsule Take 500 mg by mouth daily.   Marland Kitchen dexlansoprazole (DEXILANT) 60 MG capsule Take 60 mg by mouth daily.  Marland Kitchen diltiazem (CARDIZEM CD) 180 MG 24 hr capsule TAKE ONE CAPSULE BY MOUTH ONE TIME DAILY  . Flaxseed, Linseed, (FLAX SEED OIL PO) Take 1 tablet by mouth daily.  . fluticasone (FLONASE) 50 MCG/ACT nasal spray Place 2 sprays into both nostrils daily.  . furosemide (LASIX) 20  MG tablet Take 40 mg by mouth daily as needed for fluid or edema.  Marland Kitchen lisinopril-hydrochlorothiazide (PRINZIDE,ZESTORETIC) 20-12.5 MG per tablet TAKE ONE TABLET BY MOUTH ONE TIME DAILY  . metFORMIN (GLUCOPHAGE) 500 MG tablet TAKE ONE TABLET BY MOUTH TWICE DAILY  . multivitamin (THERAGRAN) per tablet Take 1 tablet by mouth daily.    . Omega-3 Fatty Acids (FISH OIL) 1000 MG CAPS Take 2,000 mg by mouth at bedtime.  Marland Kitchen omeprazole (PRILOSEC) 40 MG capsule Take 40 mg by mouth every morning.   . ONE TOUCH ULTRA TEST test strip USE TO CHECK BLOOD GLUCOSE ONCE DAILY  . PROLIA 60 MG/ML SOLN injection   . sucralfate (CARAFATE) 1 GM/10ML suspension 2 teaspoons before lunch, dinner and bedtime.  . traMADol (ULTRAM) 50 MG tablet Take 50 mg by mouth at bedtime as needed for moderate pain.    No facility-administered encounter medications on file as of 01/01/2015.     Review of Systems  Constitutional: Positive for fatigue.  HENT: Negative.        Seasonal allergies   Eyes: Negative.   Respiratory: Negative.   Cardiovascular: Negative.   Gastrointestinal: Negative.   Endocrine: Negative.   Genitourinary: Negative.   Musculoskeletal: Negative.   Skin: Negative.   Allergic/Immunologic: Negative.   Neurological: Positive for dizziness.  Hematological: Negative.   Psychiatric/Behavioral: Negative.        Objective:   Physical Exam  Constitutional: She is oriented to person, place, and time. She appears well-developed and well-nourished. No distress.  The patient is pleasant and alert  HENT:  Head: Normocephalic and atraumatic.  Right Ear: External ear normal.  Left Ear: External ear normal.  Mouth/Throat: Oropharynx is clear and moist.  Nasal congestion bilaterally  Eyes: Conjunctivae and EOM are normal. Pupils are equal, round, and reactive to light. Right eye exhibits no discharge. Left eye exhibits no discharge. No scleral icterus.  Neck: Normal range of motion. Neck supple. No thyromegaly  present.  No anterior cervical adenopathy or carotid bruits  Cardiovascular: Normal rate, regular rhythm, normal heart sounds and intact distal pulses.   No murmur heard. The rhythm is regular at 72/m  Pulmonary/Chest: Effort normal and breath sounds normal. No respiratory distress. She has no wheezes. She has no rales. She exhibits no tenderness.  Clear anteriorly and posteriorly  Abdominal: Soft. Bowel sounds are normal. She exhibits no mass. There is tenderness. There is no rebound and no guarding.  No masses. Persistent abdominal obesity and there was slight tenderness in the right and left upper quadrants and epigastrium.  Musculoskeletal: Normal range of motion. She exhibits no edema or tenderness.  Minimal pretibial edema and pedal edema  Lymphadenopathy:    She has no cervical adenopathy.  Neurological: She is alert and oriented to person, place, and time. She has normal reflexes. No cranial nerve deficit.  Skin: Skin is warm and dry. No rash noted.  Psychiatric: She has a normal mood and affect. Her behavior is normal. Judgment and thought content normal.  Nursing note and vitals reviewed.  BP 146/72 mmHg  Pulse 68  Temp(Src) 97 F (36.1 C) (Oral)  Ht '5\' 4"'  (1.626 m)  Wt 178 lb (80.74 kg)  BMI 30.54 kg/m2        Assessment & Plan:  1. Gastroesophageal reflux disease, esophagitis presence not specified -The patient has no complaint with [place] today. - POCT CBC - Hepatic function panel - NMR, lipoprofile  2. Type 2 diabetes mellitus without complication -She brings in blood sugars for review and they are running in the 1:30 to 160 range. - POCT CBC - POCT glycosylated hemoglobin (Hb A1C) - BMP8+EGFR  3. Vitamin D deficiency -She should continue her vitamin D pending results of lab work - POCT CBC - Vit D  25 hydroxy (rtn osteoporosis monitoring)  4. Essential hypertension -She should continue with her current blood pressure medication and watch sodium intake -  POCT CBC - BMP8+EGFR - Hepatic function panel - NMR, lipoprofile  5. Dizziness -She should continue with her nasal steroid and use nasal saline during the day  6. Chronic fatigue -We will check lab work for hemoglobin, hemoglobin A1c and thyroid  No orders of the defined types were placed in this encounter.   Patient Instructions                       Medicare Annual Wellness Visit  [facility] and the medical providers at [facility] strive to bring you the best medical care.  In doing so we not only want to address your current medical conditions and concerns but also to detect new conditions early and prevent illness, disease and health-related problems.    Medicare offers a yearly Wellness Visit which allows our clinical staff to assess your need for preventative services including immunizations, lifestyle education, counseling to decrease risk of preventable diseases and screening for fall risk and other medical concerns.    This visit is provided free of charge (no copay) for all Medicare recipients. The clinical pharmacists at [facility] have begun to conduct these Wellness Visits which will also include a thorough review of all your medications.    As you primary medical provider recommend that you make an appointment for your Annual Wellness Visit if you have not done so already this year.  You may set up this appointment before you leave today or you may call back ([phone]) and schedule an appointment.  Please make sure when you call that you mention that you are scheduling your Annual Wellness Visit with the clinical pharmacist so that the appointment may be made for the proper length of time.     Continue current medications. Continue good therapeutic lifestyle changes which include good diet and exercise. Fall precautions discussed with patient. If an FOBT was given today- please return it to our front desk. If you are over 55  years old - you may need Prevnar 24 or the adult Pneumonia vaccine.  Flu Shots are still available at our office. If you still haven't had one please call to set up a nurse visit to get one.   After your visit with [place] today you will receive a survey in the mail or online from [employer] regarding your care with [place]. Please take a moment to fill this out. Your feedback is very important to [place] as you can help [place] better understand your patient needs as well as improve your experience and satisfaction. WE CARE ABOUT YOU!!!   Continue to be careful and do not put yourself at risk for falling Continue the nose spray given to you by Shelah Lewandowsky Use nasal saline during the day Stay as active as possible Check blood sugars a little more regularly and gets some occasional blood pressures at home Watch sodium intake    Arrie Senate MD

## 2015-01-01 NOTE — Addendum Note (Signed)
Addended by: Earlene Plater on: 01/01/2015 11:20 AM   Modules accepted: Miquel Dunn

## 2015-01-01 NOTE — Patient Instructions (Addendum)
Medicare Annual Wellness Visit  [facility] and the medical providers at [facility] strive to bring you the best medical care.  In doing so we not only want to address your current medical conditions and concerns but also to detect new conditions early and prevent illness, disease and health-related problems.    Medicare offers a yearly Wellness Visit which allows our clinical staff to assess your need for preventative services including immunizations, lifestyle education, counseling to decrease risk of preventable diseases and screening for fall risk and other medical concerns.    This visit is provided free of charge (no copay) for all Medicare recipients. The clinical pharmacists at [facility] have begun to conduct these Wellness Visits which will also include a thorough review of all your medications.    As you primary medical provider recommend that you make an appointment for your Annual Wellness Visit if you have not done so already this year.  You may set up this appointment before you leave today or you may call back ([phone]) and schedule an appointment.  Please make sure when you call that you mention that you are scheduling your Annual Wellness Visit with the clinical pharmacist so that the appointment may be made for the proper length of time.     Continue current medications. Continue good therapeutic lifestyle changes which include good diet and exercise. Fall precautions discussed with patient. If an FOBT was given today- please return it to our front desk. If you are over 27 years old - you may need Prevnar 17 or the adult Pneumonia vaccine.  Flu Shots are still available at our office. If you still haven't had one please call to set up a nurse visit to get one.   After your visit with [place] today you will receive a survey in the mail or online from [employer] regarding your care with [place]. Please take a moment to  fill this out. Your feedback is very important to [place] as you can help [place] better understand your patient needs as well as improve your experience and satisfaction. WE CARE ABOUT YOU!!!   Continue to be careful and do not put yourself at risk for falling Continue the nose spray given to you by Shelah Lewandowsky Use nasal saline during the day Stay as active as possible Check blood sugars a little more regularly and gets some occasional blood pressures at home Watch sodium intake

## 2015-01-02 ENCOUNTER — Telehealth: Payer: Self-pay | Admitting: *Deleted

## 2015-01-02 LAB — HEPATIC FUNCTION PANEL
ALT: 17 IU/L (ref 0–32)
AST: 16 IU/L (ref 0–40)
Albumin: 4.2 g/dL (ref 3.5–4.7)
Alkaline Phosphatase: 62 IU/L (ref 39–117)
Bilirubin Total: 0.3 mg/dL (ref 0.0–1.2)
Bilirubin, Direct: 0.07 mg/dL (ref 0.00–0.40)
TOTAL PROTEIN: 6.3 g/dL (ref 6.0–8.5)

## 2015-01-02 LAB — BMP8+EGFR
BUN/Creatinine Ratio: 14 (ref 11–26)
BUN: 10 mg/dL (ref 8–27)
CALCIUM: 9.2 mg/dL (ref 8.7–10.3)
CHLORIDE: 97 mmol/L (ref 97–108)
CO2: 25 mmol/L (ref 18–29)
Creatinine, Ser: 0.7 mg/dL (ref 0.57–1.00)
GFR calc non Af Amer: 80 mL/min/{1.73_m2} (ref >59)
GFR, EST AFRICAN AMERICAN: 92 mL/min/{1.73_m2} (ref >59)
Glucose: 162 mg/dL — ABNORMAL HIGH (ref 65–99)
Potassium: 4.5 mmol/L (ref 3.5–5.2)
Sodium: 138 mmol/L (ref 134–144)

## 2015-01-02 LAB — THYROID PANEL WITH TSH
Free Thyroxine Index: 1.8 (ref 1.2–4.9)
T3 Uptake Ratio: 24 % (ref 24–39)
T4 TOTAL: 7.6 ug/dL (ref 4.5–12.0)
TSH: 1.79 u[IU]/mL (ref 0.450–4.500)

## 2015-01-02 LAB — LIPID PANEL
CHOLESTEROL TOTAL: 204 mg/dL — AB (ref 100–199)
Chol/HDL Ratio: 6.2 ratio units — ABNORMAL HIGH (ref 0.0–4.4)
HDL: 33 mg/dL — ABNORMAL LOW (ref >39)
LDL Calculated: 107 mg/dL — ABNORMAL HIGH (ref 0–99)
Triglycerides: 322 mg/dL — ABNORMAL HIGH (ref 0–149)
VLDL Cholesterol Cal: 64 mg/dL — ABNORMAL HIGH (ref 5–40)

## 2015-01-02 LAB — VITAMIN D 25 HYDROXY (VIT D DEFICIENCY, FRACTURES): Vit D, 25-Hydroxy: 42.2 ng/mL (ref 30.0–100.0)

## 2015-01-02 NOTE — Telephone Encounter (Signed)
-----   Message from Chipper Herb, MD sent at 01/02/2015  7:20 AM EDT ----- The blood sugar is higher than in the past at 162. The creatinine, the most important kidney function test remains within normal limits. The electrolytes including potassium are good. All liver function tests are within normal limits The vitamin D level is good at 42.2 and the patient should continue with her current treatment of 1000 of D3 daily Cholesterol numbers are elevated. The triglycerides are elevated at 322 this is the highest they have ever been. The LDL C is elevated at 107 and this is consistent with past readings. The good cholesterol or the HDL is low.----- please confirm with the patient that she is taking atorvastatin 10 mg daily. If she is she's been taking it regularly, this should be increased to 20 mg daily. Please call in a prescription in for 20 after she finishes taking 2 of the tens and completes her current stock of 10 mg. She should have her liver function tests in 4-6 weeks and she does not have to be fasting since we've increased the dose of the atorvastatin. She should continue to take omega-3 fatty acids or Fish oil. She should try to do better with her diet and get as much exercise as possible. All thyroid function tests are within normal limits

## 2015-01-03 ENCOUNTER — Telehealth: Payer: Self-pay | Admitting: Family Medicine

## 2015-01-03 NOTE — Telephone Encounter (Signed)
Patient aware of results.

## 2015-01-05 ENCOUNTER — Other Ambulatory Visit: Payer: Self-pay | Admitting: Family Medicine

## 2015-01-17 ENCOUNTER — Other Ambulatory Visit: Payer: Self-pay | Admitting: Family Medicine

## 2015-01-17 DIAGNOSIS — J40 Bronchitis, not specified as acute or chronic: Secondary | ICD-10-CM | POA: Diagnosis not present

## 2015-01-17 DIAGNOSIS — R05 Cough: Secondary | ICD-10-CM | POA: Diagnosis not present

## 2015-01-17 NOTE — Telephone Encounter (Signed)
Pt informed to call back if she has symptoms of yeast infection Verbalizes understanding

## 2015-01-24 DIAGNOSIS — M1990 Unspecified osteoarthritis, unspecified site: Secondary | ICD-10-CM | POA: Diagnosis not present

## 2015-01-24 DIAGNOSIS — M7060 Trochanteric bursitis, unspecified hip: Secondary | ICD-10-CM | POA: Diagnosis not present

## 2015-01-24 DIAGNOSIS — M4806 Spinal stenosis, lumbar region: Secondary | ICD-10-CM | POA: Diagnosis not present

## 2015-01-24 DIAGNOSIS — N3020 Other chronic cystitis without hematuria: Secondary | ICD-10-CM | POA: Diagnosis not present

## 2015-01-24 DIAGNOSIS — G8929 Other chronic pain: Secondary | ICD-10-CM | POA: Diagnosis not present

## 2015-01-24 DIAGNOSIS — N952 Postmenopausal atrophic vaginitis: Secondary | ICD-10-CM | POA: Diagnosis not present

## 2015-03-05 ENCOUNTER — Other Ambulatory Visit: Payer: Self-pay | Admitting: Family Medicine

## 2015-03-13 ENCOUNTER — Telehealth: Payer: Self-pay | Admitting: Family Medicine

## 2015-03-13 ENCOUNTER — Encounter: Payer: Self-pay | Admitting: Physician Assistant

## 2015-03-13 ENCOUNTER — Ambulatory Visit (INDEPENDENT_AMBULATORY_CARE_PROVIDER_SITE_OTHER): Payer: Medicare Other | Admitting: Physician Assistant

## 2015-03-13 VITALS — BP 126/80 | HR 89 | Temp 96.9°F | Ht 64.0 in | Wt 176.6 lb

## 2015-03-13 DIAGNOSIS — R300 Dysuria: Secondary | ICD-10-CM

## 2015-03-13 LAB — POCT URINALYSIS DIPSTICK
BILIRUBIN UA: NEGATIVE
GLUCOSE UA: NEGATIVE
Ketones, UA: NEGATIVE
Nitrite, UA: POSITIVE
PH UA: 6.5
Protein, UA: NEGATIVE
RBC UA: NEGATIVE
Spec Grav, UA: 1.01
UROBILINOGEN UA: NEGATIVE

## 2015-03-13 LAB — POCT UA - MICROSCOPIC ONLY
Crystals, Ur, HPF, POC: NEGATIVE
YEAST UA: NEGATIVE

## 2015-03-13 MED ORDER — CIPROFLOXACIN HCL 500 MG PO TABS: 500.0000 mg | ORAL_TABLET | Freq: Two times a day (BID) | ORAL | Status: DC

## 2015-03-13 NOTE — Telephone Encounter (Signed)
Appointment given for today.

## 2015-03-13 NOTE — Patient Instructions (Signed)

## 2015-03-13 NOTE — Progress Notes (Signed)
Subjective:     Patient ID: Debra Phillips, female   DOB: 1928/10/06, 81 y.o.   MRN: [id number]  HPI Pt here due to dysuria She has a hx of fr eq UTI Seen and followed by Dr Roni Bread as well Sx have been waxing and waning over the last several weeks  Review of Systems  Gastrointestinal: Negative.   Genitourinary: Positive for dysuria, frequency and decreased urine volume. Negative for hematuria, flank pain, enuresis, difficulty urinating and pelvic pain.       Objective:   Physical Exam  Constitutional: She appears well-developed and well-nourished.  HENT:  Mouth/Throat: Oropharynx is clear and moist. No oropharyngeal exudate.  Cardiovascular: Normal rate, regular rhythm and normal heart sounds.   Pulmonary/Chest: Effort normal and breath sounds normal.  Abdominal: Soft. Bowel sounds are normal. She exhibits no distension and no mass. There is tenderness. There is no rebound and no guarding.  No CVAT + suprapubic TTP  Nursing note and vitals reviewed.  Results for orders placed or performed in visit on 03/13/15  POCT UA - Microscopic Only  Result Value Ref Range   WBC, Ur, HPF, POC 15-20    RBC, urine, microscopic 1-5    Bacteria, U Microscopic many    Mucus, UA rare    Epithelial cells, urine per micros rare    Crystals, Ur, HPF, POC neg    Casts, Ur, LPF, POC ne    Yeast, UA neg   POCT urinalysis dipstick  Result Value Ref Range   Color, UA yellow    Clarity, UA cloudy    Glucose, UA neg    Bilirubin, UA neg    Ketones, UA neg    Spec Grav, UA 1.010    Blood, UA neg    pH, UA 6.5    Protein, UA neg    Urobilinogen, UA negative    Nitrite, UA pos    Leukocytes, UA large (3+) (A) Negative       Assessment:     Dysuria    Plan:     Cipro 500 mg bid x 1 week has worked well prev Hydrate well OTC med's for sx AF/U prn

## 2015-03-16 ENCOUNTER — Telehealth: Payer: Self-pay | Admitting: Family Medicine

## 2015-03-16 MED ORDER — FLUCONAZOLE 150 MG PO TABS: 150.0000 mg | ORAL_TABLET | Freq: Once | ORAL | Status: DC

## 2015-03-16 NOTE — Telephone Encounter (Signed)
Prescription sent to pharmacy.

## 2015-04-05 ENCOUNTER — Other Ambulatory Visit: Payer: Self-pay | Admitting: Family Medicine

## 2015-05-08 ENCOUNTER — Other Ambulatory Visit: Payer: Self-pay | Admitting: Family Medicine

## 2015-05-08 ENCOUNTER — Ambulatory Visit (INDEPENDENT_AMBULATORY_CARE_PROVIDER_SITE_OTHER): Payer: Medicare Other | Admitting: Family Medicine

## 2015-05-08 ENCOUNTER — Encounter: Payer: Self-pay | Admitting: Family Medicine

## 2015-05-08 VITALS — BP 126/79 | HR 60 | Temp 97.1°F | Ht 64.0 in | Wt 174.0 lb

## 2015-05-08 DIAGNOSIS — R2681 Unsteadiness on feet: Secondary | ICD-10-CM | POA: Diagnosis not present

## 2015-05-08 DIAGNOSIS — I10 Essential (primary) hypertension: Secondary | ICD-10-CM

## 2015-05-08 DIAGNOSIS — R531 Weakness: Secondary | ICD-10-CM

## 2015-05-08 DIAGNOSIS — M4726 Other spondylosis with radiculopathy, lumbar region: Secondary | ICD-10-CM | POA: Diagnosis not present

## 2015-05-08 DIAGNOSIS — E559 Vitamin D deficiency, unspecified: Secondary | ICD-10-CM | POA: Diagnosis not present

## 2015-05-08 DIAGNOSIS — K219 Gastro-esophageal reflux disease without esophagitis: Secondary | ICD-10-CM | POA: Diagnosis not present

## 2015-05-08 DIAGNOSIS — E119 Type 2 diabetes mellitus without complications: Secondary | ICD-10-CM

## 2015-05-08 DIAGNOSIS — R300 Dysuria: Secondary | ICD-10-CM

## 2015-05-08 DIAGNOSIS — M47816 Spondylosis without myelopathy or radiculopathy, lumbar region: Secondary | ICD-10-CM | POA: Insufficient documentation

## 2015-05-08 LAB — POCT UA - MICROSCOPIC ONLY
CASTS, UR, LPF, POC: NEGATIVE
CRYSTALS, UR, HPF, POC: NEGATIVE
MUCUS UA: NEGATIVE
Yeast, UA: NEGATIVE

## 2015-05-08 LAB — POCT URINALYSIS DIPSTICK
BILIRUBIN UA: NEGATIVE
Glucose, UA: NEGATIVE
KETONES UA: NEGATIVE
Nitrite, UA: POSITIVE
PH UA: 8
Protein, UA: NEGATIVE
Spec Grav, UA: <=1.005
Urobilinogen, UA: NEGATIVE

## 2015-05-08 LAB — POCT GLYCOSYLATED HEMOGLOBIN (HGB A1C): Hemoglobin A1C: 6.8

## 2015-05-08 MED ORDER — CIPROFLOXACIN HCL 500 MG PO TABS: 500.0000 mg | ORAL_TABLET | Freq: Two times a day (BID) | ORAL | Status: DC

## 2015-05-08 NOTE — Patient Instructions (Addendum)
Medicare Annual Wellness Visit  [facility] and the medical providers at [facility] strive to bring you the best medical care.  In doing so we not only want to address your current medical conditions and concerns but also to detect new conditions early and prevent illness, disease and health-related problems.    Medicare offers a yearly Wellness Visit which allows our clinical staff to assess your need for preventative services including immunizations, lifestyle education, counseling to decrease risk of preventable diseases and screening for fall risk and other medical concerns.    This visit is provided free of charge (no copay) for all Medicare recipients. The clinical pharmacists at [facility] have begun to conduct these Wellness Visits which will also include a thorough review of all your medications.    As you primary medical provider recommend that you make an appointment for your Annual Wellness Visit if you have not done so already this year.  You may set up this appointment before you leave today or you may call back ([phone]) and schedule an appointment.  Please make sure when you call that you mention that you are scheduling your Annual Wellness Visit with the clinical pharmacist so that the appointment may be made for the proper length of time.     Continue current medications. Continue good therapeutic lifestyle changes which include good diet and exercise. Fall precautions discussed with patient. If an FOBT was given today- please return it to our front desk. If you are over 45 years old - you may need Prevnar 41 or the adult Pneumonia vaccine.  **Flu shots will be available soon--- please call and schedule a FLU-CLINIC appointment**  After your visit with [place] today you will receive a survey in the mail or online from [employer] regarding your care with [place]. Please take a moment to fill this out. Your feedback is  very important to [place] as you can help [place] better understand your patient needs as well as improve your experience and satisfaction. WE CARE ABOUT YOU!!!   **Please join [place] SEPT.22, 2016 from 5:00 to 7:00pm for our OPEN HOUSE! Come out and meet our NEW providers** We'll arrange for you to have an appointment with physical therapy for gait strengthening and monitoring your neck and back pain from osteoarthritis The goal for this is to keep you from falling and keep use stronger with movement Please discuss with her daughter and schedule a follow-up appointment with the urologist Do not forget to get your flu shot. Continue to monitor blood sugars at home and blood pressures if possible

## 2015-05-08 NOTE — Progress Notes (Signed)
Subjective:    Patient ID: Debra Phillips, female    DOB: May 17, 1929, 81 y.o.   MRN: [id number]  HPI Pt here for follow up and management of chronic medical problems which includes hypertension and diabetes. She is taking medications regularly. The patient complains of fatigue today. She has had a recent thyroid profile and this was normal. A few home blood sugars were brought in for review and these are running anywhere from the 132 160 range with a few outliers above this. The blood pressures on the outside are anywhere from 128-150 over 80s to 90s. The patient denies chest pain but does have some shortness of breath periodically. She is swallowing her food without problems and not having any indigestion or heartburn nausea vomiting or diarrhea and has not seen any blood in the stool. She does have this problem with chronic cystitis and is followed regularly by the urologist and has almost constantly some burning and discomfort with voiding. She does have an upcoming appointment planned or schedule with her daughter to go back and visit the urologist. She does have occasional palpitations with her heart racing. Unfortunately she has had a couple of falls and it's more of a balance issue. She brings in blood sugars from the outside for review and they will be scanned into the record.      Patient Active Problem List   Diagnosis Date Noted  . Spondylosis of lumbar region without myelopathy or radiculopathy 09/01/2014  . Diabetes 04/26/2014  . Vitamin D deficiency 04/26/2014  . Chronic cystitis 04/26/2014  . Atrophic vaginitis 04/26/2014  . Solitary pulmonary nodule 03/20/2014  . Chest pain 03/18/2014  . Osteoporotic compression fracture of spine with routine healing 03/08/2014  . Osteoporosis with fracture 03/08/2014  . Osteoporosis, post-menopausal 03/08/2014  . Precordial pain 02/16/2012  . PALPITATIONS 02/27/2010  . CAD 12/12/2009  . Aortic aneurysm of unspecified site without  mention of rupture 06/27/2009  . DYSPNEA 06/27/2009  . DYSLIPIDEMIA 12/12/2008  . Essential hypertension 12/12/2008  . GERD 12/12/2008  . OSTEOARTHRITIS 12/12/2008   Outpatient Encounter Prescriptions as of 05/08/2015  Medication Sig  . aspirin 81 MG tablet Take 81 mg by mouth every evening.   . Calcium Carb-Cholecalciferol 600-500 MG-UNIT CAPS Take 1 capsule by mouth every morning.  . Cholecalciferol 1000 UNITS tablet Take 1,000 Units by mouth daily.   . Cinnamon 500 MG capsule Take 500 mg by mouth daily.   Marland Kitchen dexlansoprazole (DEXILANT) 60 MG capsule Take 60 mg by mouth daily.  Marland Kitchen diltiazem (CARDIZEM CD) 180 MG 24 hr capsule TAKE ONE CAPSULE BY MOUTH ONE TIME DAILY  . Flaxseed, Linseed, (FLAX SEED OIL PO) Take 1 tablet by mouth daily.  . fluticasone (FLONASE) 50 MCG/ACT nasal spray Place 2 sprays into both nostrils daily.  . furosemide (LASIX) 20 MG tablet Take 40 mg by mouth daily as needed for fluid or edema.  Marland Kitchen lisinopril-hydrochlorothiazide (PRINZIDE,ZESTORETIC) 20-12.5 MG per tablet TAKE ONE TABLET BY MOUTH ONE TIME DAILY  . metFORMIN (GLUCOPHAGE) 500 MG tablet TAKE ONE TABLET BY MOUTH TWICE DAILY  . multivitamin (THERAGRAN) per tablet Take 1 tablet by mouth daily.    . Omega-3 Fatty Acids (FISH OIL) 1000 MG CAPS Take 2,000 mg by mouth at bedtime.  . ONE TOUCH ULTRA TEST test strip USE TO CHECK BLOOD GLUCOSE ONCE DAILY  . PROLIA 60 MG/ML SOLN injection   . traMADol (ULTRAM) 50 MG tablet Take 50 mg by mouth at bedtime as needed for moderate pain.   . [  DISCONTINUED] ciprofloxacin (CIPRO) 500 MG tablet Take 1 tablet (500 mg total) by mouth 2 (two) times daily.  . [DISCONTINUED] fluconazole (DIFLUCAN) 150 MG tablet Take 1 tablet (150 mg total) by mouth once.  . [DISCONTINUED] omeprazole (PRILOSEC) 40 MG capsule Take 40 mg by mouth every morning.   . [DISCONTINUED] sucralfate (CARAFATE) 1 GM/10ML suspension 2 teaspoons before lunch, dinner and bedtime.  . [DISCONTINUED] atorvastatin  (LIPITOR) 10 MG tablet TAKE ONE TABLET BY MOUTH ONE TIME DAILY (Patient not taking: Reported on 05/08/2015)   No facility-administered encounter medications on file as of 05/08/2015.      Review of Systems  Constitutional: Positive for fatigue.  HENT: Negative.   Eyes: Negative.   Respiratory: Negative.   Cardiovascular: Negative.   Gastrointestinal: Negative.   Endocrine: Negative.   Genitourinary: Negative.   Musculoskeletal: Negative.   Skin: Negative.   Allergic/Immunologic: Negative.   Neurological: Negative.   Hematological: Negative.   Psychiatric/Behavioral: Negative.        Objective:   Physical Exam  Constitutional: She is oriented to person, place, and time. She appears well-developed and well-nourished. No distress.  The patient is alert and calm despite her many complaints  HENT:  Head: Normocephalic and atraumatic.  Right Ear: External ear normal.  Left Ear: External ear normal.  Nose: Nose normal.  Mouth/Throat: Oropharynx is clear and moist. No oropharyngeal exudate.  Eyes: Conjunctivae and EOM are normal. Pupils are equal, round, and reactive to light. Right eye exhibits no discharge. Left eye exhibits no discharge. No scleral icterus.  Neck: Normal range of motion. Neck supple. No thyromegaly present.  Some discomfort with movement otherwise no Vashti Hey are adenopathy  Cardiovascular: Normal rate, regular rhythm and intact distal pulses.   No murmur heard. Heart has a regular rate and rhythm today at 72/m  Pulmonary/Chest: Effort normal and breath sounds normal. No respiratory distress. She has no wheezes. She has no rales. She exhibits no tenderness.  Lungs are clear anteriorly and posteriorly  Abdominal: Soft. Bowel sounds are normal. She exhibits no mass. There is tenderness. There is no rebound and no guarding.  The abdomen is generally tender right upper quadrant lower abdomen and epigastric areas. She has had multiple surgeries.  Musculoskeletal: Normal  range of motion. She exhibits tenderness. She exhibits no edema.  Range of motion is good but she has stiffness in her neck and she has a lot of problems laying down on the table because of her chronic low back pain from her osteoarthritis.  Lymphadenopathy:    She has no cervical adenopathy.  Neurological: She is alert and oriented to person, place, and time. She has normal reflexes. No cranial nerve deficit.  Skin: Skin is warm and dry. No rash noted.  Psychiatric: She has a normal mood and affect. Her behavior is normal. Judgment and thought content normal.  Nursing note and vitals reviewed.   BP 126/79 mmHg  Pulse 60  Temp(Src) 97.1 F (36.2 C) (Oral)  Ht '5\' 4"'  (1.626 m)  Wt 174 lb (78.926 kg)  BMI 29.85 kg/m2       Assessment & Plan:  1. Type 2 diabetes mellitus without complication -The patient will continue with current treatment pending results of her hemoglobin A1c - POCT glycosylated hemoglobin (Hb A1C) - BMP8+EGFR - CBC with Differential/Platelet - NMR, lipoprofile  2. Vitamin D deficiency -Continue with vitamin D replacement pending results of this test - CBC with Differential/Platelet - Vit D  25 hydroxy (rtn osteoporosis monitoring)  3.  Essential hypertension -The blood pressure is good today and she will continue with her current treatment - BMP8+EGFR - CBC with Differential/Platelet - Hepatic function panel - NMR, lipoprofile  4. Gastroesophageal reflux disease, esophagitis presence not specified -She does have some discomfort and most of the discomfort is secondary to what she eats and she will continue to watch her diet closely and continue with the current PPI. - CBC with Differential/Platelet  5. Osteoarthritis of spine with radiculopathy, lumbar region -This is been going on for a long time and a lot of it is secondary to all the work activity she had to do with her husband before he passed away. She has seen the neurosurgeon and he says that he is  not able to do any more surgery on her back area and she also has problems with her neck. - Ambulatory referral to Physical Therapy  6. Gait instability -She has had a couple of falls and she says most of this is related to poor balance. She has agreed to go get some physical therapy to help strengthen her muscles and improve the chances for not following. Hopefully they can also do some treatments for her neck and back. - Ambulatory referral to Physical Therapy  7. Weakness generalized -Physical therapy is planned when she returns from her trip to [place]  Patient Instructions                       Medicare Annual Wellness Visit  [facility] and the medical providers at [facility] strive to bring you the best medical care.  In doing so we not only want to address your current medical conditions and concerns but also to detect new conditions early and prevent illness, disease and health-related problems.    Medicare offers a yearly Wellness Visit which allows our clinical staff to assess your need for preventative services including immunizations, lifestyle education, counseling to decrease risk of preventable diseases and screening for fall risk and other medical concerns.    This visit is provided free of charge (no copay) for all Medicare recipients. The clinical pharmacists at [facility] have begun to conduct these Wellness Visits which will also include a thorough review of all your medications.    As you primary medical provider recommend that you make an appointment for your Annual Wellness Visit if you have not done so already this year.  You may set up this appointment before you leave today or you may call back ([phone]) and schedule an appointment.  Please make sure when you call that you mention that you are scheduling your Annual Wellness Visit with the clinical pharmacist so that the appointment may be made for the proper length of  time.     Continue current medications. Continue good therapeutic lifestyle changes which include good diet and exercise. Fall precautions discussed with patient. If an FOBT was given today- please return it to our front desk. If you are over 46 years old - you may need Prevnar 58 or the adult Pneumonia vaccine.  **Flu shots will be available soon--- please call and schedule a FLU-CLINIC appointment**  After your visit with [place] today you will receive a survey in the mail or online from [employer] regarding your care with [place]. Please take a moment to fill this out. Your feedback is very important to [place] as you can help [place] better understand your patient needs as well as improve your experience and  satisfaction. WE CARE ABOUT YOU!!!   **Please join [place] SEPT.22, 2016 from 5:00 to 7:00pm for our OPEN HOUSE! Come out and meet our NEW providers** We'll arrange for you to have an appointment with physical therapy for gait strengthening and monitoring your neck and back pain from osteoarthritis The goal for this is to keep you from falling and keep use stronger with movement Please discuss with her daughter and schedule a follow-up appointment with the urologist Do not forget to get your flu shot. Continue to monitor blood sugars at home and blood pressures if possible   Arrie Senate MD

## 2015-05-08 NOTE — Addendum Note (Signed)
Addended by: Earlene Plater on: 05/08/2015 11:53 AM   Modules accepted: Orders, SmartSet

## 2015-05-08 NOTE — Addendum Note (Signed)
Addended by: Zannie Cove on: 05/08/2015 12:46 PM   Modules accepted: Orders

## 2015-05-09 LAB — BMP8+EGFR
BUN/Creatinine Ratio: 15 (ref 11–26)
BUN: 11 mg/dL (ref 8–27)
CO2: 21 mmol/L (ref 18–29)
CREATININE: 0.75 mg/dL (ref 0.57–1.00)
Calcium: 9.5 mg/dL (ref 8.7–10.3)
Chloride: 92 mmol/L — ABNORMAL LOW (ref 97–108)
GFR calc Af Amer: 85 mL/min/{1.73_m2} (ref >59)
GFR calc non Af Amer: 73 mL/min/{1.73_m2} (ref >59)
GLUCOSE: 140 mg/dL — AB (ref 65–99)
Potassium: 4.5 mmol/L (ref 3.5–5.2)
Sodium: 134 mmol/L (ref 134–144)

## 2015-05-09 LAB — NMR, LIPOPROFILE
Cholesterol: 216 mg/dL — ABNORMAL HIGH (ref 100–199)
HDL CHOLESTEROL BY NMR: 35 mg/dL — AB (ref >39)
HDL PARTICLE NUMBER: 28.6 umol/L — AB (ref >=30.5)
LDL Particle Number: 1905 nmol/L — ABNORMAL HIGH (ref <1000)
LDL Size: 19.6 nm (ref >20.5)
LDL-C: 106 mg/dL — ABNORMAL HIGH (ref 0–99)
LP-IR Score: 72 — ABNORMAL HIGH (ref <=45)
SMALL LDL PARTICLE NUMBER: 1214 nmol/L — AB (ref <=527)
Triglycerides by NMR: 374 mg/dL — ABNORMAL HIGH (ref 0–149)

## 2015-05-09 LAB — VITAMIN D 25 HYDROXY (VIT D DEFICIENCY, FRACTURES): VIT D 25 HYDROXY: 37.6 ng/mL (ref 30.0–100.0)

## 2015-05-09 LAB — HEPATIC FUNCTION PANEL
ALBUMIN: 4.3 g/dL (ref 3.5–4.7)
ALK PHOS: 60 IU/L (ref 39–117)
ALT: 18 IU/L (ref 0–32)
AST: 20 IU/L (ref 0–40)
BILIRUBIN TOTAL: 0.4 mg/dL (ref 0.0–1.2)
BILIRUBIN, DIRECT: 0.1 mg/dL (ref 0.00–0.40)
Total Protein: 6.6 g/dL (ref 6.0–8.5)

## 2015-05-09 LAB — CBC WITH DIFFERENTIAL/PLATELET
Basophils Absolute: 0 10*3/uL (ref 0.0–0.2)
Basos: 1 %
EOS (ABSOLUTE): 0.1 10*3/uL (ref 0.0–0.4)
Eos: 2 %
Hematocrit: 37.9 % (ref 34.0–46.6)
Hemoglobin: 12.8 g/dL (ref 11.1–15.9)
Immature Grans (Abs): 0 10*3/uL (ref 0.0–0.1)
Immature Granulocytes: 0 %
Lymphocytes Absolute: 2.7 10*3/uL (ref 0.7–3.1)
Lymphs: 39 %
MCH: 30.8 pg (ref 26.6–33.0)
MCHC: 33.8 g/dL (ref 31.5–35.7)
MCV: 91 fL (ref 79–97)
Monocytes Absolute: 0.5 10*3/uL (ref 0.1–0.9)
Monocytes: 7 %
Neutrophils Absolute: 3.5 10*3/uL (ref 1.4–7.0)
Neutrophils: 51 %
Platelets: 322 10*3/uL (ref 150–379)
RBC: 4.16 x10E6/uL (ref 3.77–5.28)
RDW: 13.8 % (ref 12.3–15.4)
WBC: 6.9 10*3/uL (ref 3.4–10.8)

## 2015-05-10 LAB — URINE CULTURE

## 2015-05-21 ENCOUNTER — Telehealth: Payer: Self-pay | Admitting: Family Medicine

## 2015-05-21 ENCOUNTER — Other Ambulatory Visit: Payer: Medicare Other

## 2015-05-21 DIAGNOSIS — Z1212 Encounter for screening for malignant neoplasm of rectum: Secondary | ICD-10-CM

## 2015-05-21 DIAGNOSIS — R300 Dysuria: Secondary | ICD-10-CM | POA: Diagnosis not present

## 2015-05-21 NOTE — Telephone Encounter (Signed)
Question answered. 

## 2015-05-21 NOTE — Progress Notes (Signed)
Lab only 

## 2015-05-23 LAB — FECAL OCCULT BLOOD, IMMUNOCHEMICAL: Fecal Occult Bld: NEGATIVE

## 2015-05-24 LAB — URINE CULTURE

## 2015-05-25 DIAGNOSIS — N3941 Urge incontinence: Secondary | ICD-10-CM | POA: Diagnosis not present

## 2015-05-25 DIAGNOSIS — N952 Postmenopausal atrophic vaginitis: Secondary | ICD-10-CM | POA: Diagnosis not present

## 2015-05-25 DIAGNOSIS — N3020 Other chronic cystitis without hematuria: Secondary | ICD-10-CM | POA: Diagnosis not present

## 2015-05-25 DIAGNOSIS — N760 Acute vaginitis: Secondary | ICD-10-CM | POA: Diagnosis not present

## 2015-05-28 ENCOUNTER — Ambulatory Visit: Payer: Medicare Other | Attending: Family Medicine | Admitting: Physical Therapy

## 2015-05-28 DIAGNOSIS — R2681 Unsteadiness on feet: Secondary | ICD-10-CM | POA: Diagnosis not present

## 2015-05-28 DIAGNOSIS — M5441 Lumbago with sciatica, right side: Secondary | ICD-10-CM | POA: Insufficient documentation

## 2015-05-28 DIAGNOSIS — R531 Weakness: Secondary | ICD-10-CM | POA: Diagnosis not present

## 2015-05-28 DIAGNOSIS — M5442 Lumbago with sciatica, left side: Secondary | ICD-10-CM | POA: Diagnosis not present

## 2015-05-28 NOTE — Patient Instructions (Signed)
      SIT TO STAND - NO HANDS Start by sitting in a chair. Next, raise up to standing without using your hands for support. Repeat 10 Times Hold 1 Second Complete 2 Sets Perform 2 Time(s) a Day SEATED MARCHING While seated in a chair, draw up your knee, set it down and then alternate to your other side. Repeat 10 Times Hold 1 Second Complete 2 Sets Perform 2 Time(s) a Day HIP ABDUCTION - STANDING While standing, raise your leg out to the side. Keep your knee straight and maintain your toes pointed forward the entire time. Use your arms for support if needed for balance and safety.  Madelyn Flavors, PT 05/28/2015 12:14 PM [facility] [place], [place], [address] Phone: [phone]   Fax:  [phone]

## 2015-05-28 NOTE — Therapy (Signed)
[facility] [place], [place], [address] Phone: [phone]   Fax:  [phone]  Physical Therapy Evaluation  Patient Details  Name: Debra Phillips MRN: [id number] Date of Birth: 1929-05-16 Referring Provider:  Chipper Herb, MD  Encounter Date: 05/28/2015      PT End of Session - 05/28/15 1035    Visit Number 1   Number of Visits 12   Date for PT Re-Evaluation 07/09/15   PT Start Time 2542   PT Stop Time 1116   PT Time Calculation (min) 41 min   Activity Tolerance Patient tolerated treatment well   Behavior During Therapy [facility] for tasks assessed/performed      Past Medical History  Diagnosis Date  . Hypertension   . Esophageal stricture     s/p dilation  . Osteoarthritis   . GERD (gastroesophageal reflux disease)   . Breast cyst     Right  . Dyslipidemia   . Aortic root dilatation     Mild  . Diabetes mellitus without complication   . Hyperlipidemia   . Osteoporosis     Past Surgical History  Procedure Laterality Date  . Partial hysterectomy    . Total knee arthroplasty      Left  . Shoulder surgery      Right  . Knee arthroscopy  2004    Left and right  . Lumbar fusion  2000,2003  . Radial head arthroplasty  5/12    orif rt radial head  . Back surgery    . Abdominal hysterectomy    . Appendectomy    . Cholecystectomy  1993  . Cardiac catheterization  2010    both cataracts  . Fracture surgery      rt wrist2010  . Kyphosis surgery    . Hardware removal  12/10/2011    Procedure: HARDWARE REMOVAL;  Surgeon: Schuyler Amor, MD;  Location: [facility];  Service: Orthopedics;  Laterality: Right;  radial head    There were no vitals filed for this visit.  Visit Diagnosis:  Unsteadiness - Plan: PT plan of care cert/re-cert  Weakness generalized - Plan: PT plan of care cert/re-cert  Bilateral low back pain with left-sided sciatica - Plan: PT plan of care  cert/re-cert  Bilateral low back pain with right-sided sciatica - Plan: PT plan of care cert/re-cert      Subjective Assessment - 05/28/15 1038    Subjective Patient reports she has two falls in the past six months. Once when she fell backwards after standing from a squat and the second she turned around and missed her step on a step. The most recent was two weeks ago. She also reports a long h/o low back pain.    Patient Stated Goals to reduce falling, to keep moving   Currently in Pain? Yes   Pain Score 6    Pain Location Back   Pain Orientation Right;Left;Lower   Pain Descriptors / Indicators Aching   Pain Radiating Towards bil hips   Pain Onset More than a month ago   Pain Frequency Intermittent   Aggravating Factors  afer sitting   Pain Relieving Factors moving around   Effect of Pain on Daily Activities hurts            [facility] PT Assessment - 05/28/15 0001    Assessment   Medical Diagnosis Gait instability; OA lumbar with radiculopathy   Next MD Visit 4 months   Precautions   Precautions Fall  Balance Screen   Has the patient fallen in the past 6 months Yes   How many times? 2   Has the patient had a decrease in activity level because of a fear of falling?  No   Is the patient reluctant to leave their home because of a fear of falling?  No   Prior Function   Level of Independence Independent   ROM / Strength   AROM / PROM / Strength Strength   Strength   Overall Strength Comments functionally has  difficulty standing on LLE  to perform hip ABD on R   Strength Assessment Site Knee;Hip   Right/Left Hip Right;Left   Right Hip Flexion 3+/5   Right Hip ABduction 5/5  sitting; standing 4-/5   Right Hip ADduction 5/5   Left Hip Flexion 3+/5   Left Hip ABduction 5/5  standing 4-/5   Left Hip ADduction 5/5  sitting   Right/Left Knee Right;Left   Right Knee Flexion 5/5   Right Knee Extension 4+/5   Left Knee Flexion 3+/5   Left Knee Extension 4/5   Palpation    Palpation comment even pelvis in standing   Balance   Balance Assessed Yes   Standardized Balance Assessment   Standardized Balance Assessment Berg Balance Test;Five Times Sit to Stand   Five times sit to stand comments  32 seconds   Berg Balance Test   Sit to Stand Able to stand  independently using hands   Standing Unsupported Able to stand safely 2 minutes   Sitting with Back Unsupported but Feet Supported on Floor or Stool Able to sit safely and securely 2 minutes   Stand to Sit Sits safely with minimal use of hands   Transfers Able to transfer safely, minor use of hands   Standing Unsupported with Eyes Closed Able to stand 3 seconds   Standing Ubsupported with Feet Together Able to place feet together independently but unable to hold for 30 seconds   From Standing, Reach Forward with Outstretched Arm Can reach confidently >25 cm (10")   From Standing Position, Pick up Object from Floor Able to pick up shoe, needs supervision   From Standing Position, Turn to Look Behind Over each Shoulder Looks behind one side only/other side shows less weight shift   Turn 360 Degrees Able to turn 360 degrees safely but slowly   Standing Unsupported, Alternately Place Feet on Step/Stool Able to complete >2 steps/needs minimal assist   Standing Unsupported, One Foot in Front Needs help to step but can hold 15 seconds   Standing on One Leg Tries to lift leg/unable to hold 3 seconds but remains standing independently   Total Score 38                           PT Education - 05/28/15 1224    Education provided Yes   Education Details Fall safety: encouraged patient to use walker at all times. Seated marching, sit to stand and standing hip ABD at counter   Person(s) Educated Patient   Methods Explanation;Demonstration;Handout   Comprehension Verbalized understanding;Returned demonstration          PT Short Term Goals - 05/28/15 1234    PT SHORT TERM GOAL #1   Title I with  initial HEP (06/11/15)   Time 2   Period Weeks   Status New   PT SHORT TERM GOAL #2   Title patient to obtain RW (06/11/15)  PT Long Term Goals - 2015/06/01 1235    PT LONG TERM GOAL #1   Title I with advanced HEP   Time 6   Period Weeks   Status New   PT LONG TERM GOAL #2   Title Pt to amb safely in clinic with least restrictive AD   Time 6   Period Weeks   Status New   PT LONG TERM GOAL #3   Title improved BERG score to 46/56 or better to help reduce falls   Time 6   Period Weeks   Status New   PT LONG TERM GOAL #4   Title improve 5 times sit to stand to 20 seconds   Time 6   Period Weeks   Status New   PT LONG TERM GOAL #5   Title demo 4/5 or better BLE strength to improve function.   Time 6   Period Weeks   Status New   Additional Long Term Goals   Additional Long Term Goals Yes   PT LONG TERM GOAL #6   Title decreased pain in back/hips to 4/10  or less with ADLs   Time 6   Period Weeks   Status New               Plan - 2015/06/01 1226    Clinical Impression Statement Patient is a 81 year old female with a h/o of two recent falls. Her BERG score indicates she is at significant risk for falls and should use a walker at all times. Patient is reluctant to do so and states she is an active person and this will make her less active. PT discussed consequences of falls and effect of activity level. PT will f/u with MD to get script for patient for RW. Patient also demonstrates functional weakness with SLS on LLE and MMT weakness bil which may be contributing to falls as well.  She amb and stands in forward flexion which affects her COG contributing to LOB.. Patient also c/o back and bil hip pain.   Pt will benefit from skilled therapeutic intervention in order to improve on the following deficits Decreased range of motion;Difficulty walking;Decreased safety awareness;Pain;Decreased activity tolerance;Decreased balance;Decreased strength;Postural dysfunction    Rehab Potential Good   PT Frequency 2x / week   PT Duration 6 weeks   PT Treatment/Interventions ADLs/Self Care Home Management;Cryotherapy;Electrical Stimulation;Moist Heat;Therapeutic exercise;Gait training;Stair training;Ultrasound;Balance training;Neuromuscular re-education;Patient/family education;Manual techniques;Passive range of motion   PT Next Visit Plan Gait with RW, sit to stand, standing hip abd, balance activities, work on trunk extension moving COG backward. FALL RISK - be with patient at all times. F/U regarding obtaining RW.   Consulted and Agree with Plan of Care Patient          G-Codes - Jun 01, 2015 1244    Functional Assessment Tool Used BERG Balance 38/56   Functional Limitation Mobility: Walking and moving around   Mobility: Walking and Moving Around Current Status [phone]) At least 40 percent but less than 60 percent impaired, limited or restricted   Mobility: Walking and Moving Around Goal Status [phone]) At least 40 percent but less than 60 percent impaired, limited or restricted       Problem List Patient Active Problem List   Diagnosis Date Noted  . Osteoarthritis of lumbar spine 05/08/2015  . Spondylosis of lumbar region without myelopathy or radiculopathy 09/01/2014  . Diabetes ([place]) 04/26/2014  . Vitamin D deficiency 04/26/2014  . Chronic cystitis 04/26/2014  . Atrophic vaginitis 04/26/2014  .  Solitary pulmonary nodule 03/20/2014  . Chest pain 03/18/2014  . Osteoporotic compression fracture of spine with routine healing 03/08/2014  . Osteoporosis with fracture 03/08/2014  . Osteoporosis, post-menopausal 03/08/2014  . Precordial pain 02/16/2012  . PALPITATIONS 02/27/2010  . CAD 12/12/2009  . Aortic aneurysm of unspecified site without mention of rupture 06/27/2009  . DYSPNEA 06/27/2009  . DYSLIPIDEMIA 12/12/2008  . Essential hypertension 12/12/2008  . GERD 12/12/2008  . OSTEOARTHRITIS 12/12/2008   Madelyn Flavors PT  05/28/2015, 12:55 PM  [facility] [address] [place], [place], [address] Phone: [phone]   Fax:  [phone]

## 2015-05-31 ENCOUNTER — Ambulatory Visit: Payer: Medicare Other | Admitting: *Deleted

## 2015-05-31 ENCOUNTER — Encounter: Payer: Self-pay | Admitting: *Deleted

## 2015-05-31 DIAGNOSIS — R2681 Unsteadiness on feet: Secondary | ICD-10-CM | POA: Diagnosis not present

## 2015-05-31 DIAGNOSIS — M5441 Lumbago with sciatica, right side: Secondary | ICD-10-CM

## 2015-05-31 DIAGNOSIS — M5442 Lumbago with sciatica, left side: Secondary | ICD-10-CM

## 2015-05-31 DIAGNOSIS — R531 Weakness: Secondary | ICD-10-CM

## 2015-05-31 NOTE — Therapy (Signed)
[facility] [place], [place], [address] Phone: [phone]   Fax:  [phone]  Physical Therapy Treatment  Patient Details  Name: Debra Phillips MRN: [id number] Date of Birth: 1928/11/23 Referring Provider:  Chipper Herb, MD  Encounter Date: 05/31/2015      PT End of Session - 05/31/15 0827    Visit Number 2   Number of Visits 12   Date for PT Re-Evaluation 07/09/15   PT Start Time 0821   PT Stop Time 0909   PT Time Calculation (min) 48 min      Past Medical History  Diagnosis Date  . Hypertension   . Esophageal stricture     s/p dilation  . Osteoarthritis   . GERD (gastroesophageal reflux disease)   . Breast cyst     Right  . Dyslipidemia   . Aortic root dilatation (H[facility])     Mild  . Diabetes mellitus without complication ([place])   . Hyperlipidemia   . Osteoporosis     Past Surgical History  Procedure Laterality Date  . Partial hysterectomy    . Total knee arthroplasty      Left  . Shoulder surgery      Right  . Knee arthroscopy  2004    Left and right  . Lumbar fusion  2000,2003  . Radial head arthroplasty  5/12    orif rt radial head  . Back surgery    . Abdominal hysterectomy    . Appendectomy    . Cholecystectomy  1993  . Cardiac catheterization  2010    both cataracts  . Fracture surgery      rt wrist2010  . Kyphosis surgery    . Hardware removal  12/10/2011    Procedure: HARDWARE REMOVAL;  Surgeon: Schuyler Amor, MD;  Location: [facility];  Service: Orthopedics;  Laterality: Right;  radial head    There were no vitals filed for this visit.  Visit Diagnosis:  Weakness generalized  Unsteadiness  Bilateral low back pain with left-sided sciatica  Bilateral low back pain with right-sided sciatica      Subjective Assessment - 05/31/15 0825    Subjective Patient reports she has two falls in the past six months. Once when she fell backwards after standing from  a squat and the second she turned around and missed her step on a step. The most recent was two weeks ago. She also reports a long h/o low back pain.    Patient Stated Goals to reduce falling, to keep moving   Currently in Pain? Yes   Pain Score 6    Pain Location Back   Pain Orientation Right;Left;Lower   Pain Descriptors / Indicators Aching   Pain Onset More than a month ago   Pain Frequency Intermittent   Aggravating Factors  after sitting   Pain Relieving Factors moving                         [facility] Adult PT Treatment/Exercise - 05/31/15 0001    Exercises   Exercises Lumbar;Knee/Hip   Knee/Hip Exercises: Aerobic   Nustep  L3 x 13 mins for UE and LE activity. Cues for posture and drawin   Knee/Hip Exercises: Standing   Knee Flexion AROM;2 sets;20 reps  Marching   Hip Abduction AROM;10 reps;Both  Pain in LT hip when WB   Rocker Board 5 minutes  calf stretching and balance   Other Standing Knee  Exercises 6 inch block Toe taps 3x10 with CGA   Knee/Hip Exercises: Seated   Sit to Sand 10 reps  2x10 with and without UE assistance                                                                                                    Slow Wt shifting back and forth for balance--SBA/CGA              PT Short Term Goals - 05/28/15 1234    PT SHORT TERM GOAL #1   Title I with initial HEP (06/11/15)   Time 2   Period Weeks   Status New   PT SHORT TERM GOAL #2   Title patient to obtain RW (06/11/15)           PT Long Term Goals - 05/28/15 1235    PT LONG TERM GOAL #1   Title I with advanced HEP   Time 6   Period Weeks   Status New   PT LONG TERM GOAL #2   Title Pt to amb safely in clinic with least restrictive AD   Time 6   Period Weeks   Status New   PT LONG TERM GOAL #3   Title improved BERG score to 46/56 or better to help reduce falls   Time 6   Period Weeks   Status New   PT LONG TERM GOAL #4   Title improve 5 times sit to stand to 20  seconds   Time 6   Period Weeks   Status New   PT LONG TERM GOAL #5   Title demo 4/5 or better BLE strength to improve function.   Time 6   Period Weeks   Status New   Additional Long Term Goals   Additional Long Term Goals Yes   PT LONG TERM GOAL #6   Title decreased pain in back/hips to 4/10  or less with ADLs   Time 6   Period Weeks   Status New               Plan - 05/31/15 6834    Clinical Impression Statement Pt did fairly well today with balance and stregthening Acts. She has pain all over -Both hips, back, neck and shldrs. She says she leans forward to decrease pain, but actually increases pain after a while. She is still using  her [facility] and we discussed getting a FWW for safety, but Pt  needs convincing of the need.    Pt will benefit from skilled therapeutic intervention in order to improve on the following deficits Decreased range of motion;Difficulty walking;Decreased safety awareness;Pain;Decreased activity tolerance;Decreased balance;Decreased strength;Postural dysfunction   Rehab Potential Good   PT Frequency 2x / week   PT Duration 6 weeks   PT Next Visit Plan Gait with RW, sit to stand, standing hip abd, balance activities, work on trunk extension moving COG backward. FALL RISK - be with patient at all times. F/U regarding obtaining RW.   Consulted and Agree with Plan of Care Patient  Problem List Patient Active Problem List   Diagnosis Date Noted  . Osteoarthritis of lumbar spine 05/08/2015  . Spondylosis of lumbar region without myelopathy or radiculopathy 09/01/2014  . Diabetes ([place]) 04/26/2014  . Vitamin D deficiency 04/26/2014  . Chronic cystitis 04/26/2014  . Atrophic vaginitis 04/26/2014  . Solitary pulmonary nodule 03/20/2014  . Chest pain 03/18/2014  . Osteoporotic compression fracture of spine with routine healing 03/08/2014  . Osteoporosis with fracture 03/08/2014  . Osteoporosis, post-menopausal 03/08/2014  . Precordial pain  02/16/2012  . PALPITATIONS 02/27/2010  . CAD 12/12/2009  . Aortic aneurysm of unspecified site without mention of rupture 06/27/2009  . DYSPNEA 06/27/2009  . DYSLIPIDEMIA 12/12/2008  . Essential hypertension 12/12/2008  . GERD 12/12/2008  . OSTEOARTHRITIS 12/12/2008    RAMSEUR,CHRIS, PTA 05/31/2015, 9:13 AM  [facility] [address] [place], [place], [address] Phone: [phone]   Fax:  [phone]

## 2015-06-05 ENCOUNTER — Ambulatory Visit: Payer: Medicare Other | Admitting: Physical Therapy

## 2015-06-05 DIAGNOSIS — R531 Weakness: Secondary | ICD-10-CM | POA: Diagnosis not present

## 2015-06-05 DIAGNOSIS — M5442 Lumbago with sciatica, left side: Secondary | ICD-10-CM | POA: Diagnosis not present

## 2015-06-05 DIAGNOSIS — R2681 Unsteadiness on feet: Secondary | ICD-10-CM | POA: Diagnosis not present

## 2015-06-05 DIAGNOSIS — M5441 Lumbago with sciatica, right side: Secondary | ICD-10-CM | POA: Diagnosis not present

## 2015-06-05 NOTE — Therapy (Signed)
[facility] [place], [place], [address] Phone: [phone]   Fax:  [phone]  Physical Therapy Treatment  Patient Details  Name: Debra Phillips MRN: [id number] Date of Birth: 09/13/1928 Referring Provider:  Chipper Herb, MD  Encounter Date: 06/05/2015      PT End of Session - 06/05/15 1038    Visit Number 3   Number of Visits 12   Date for PT Re-Evaluation 07/09/15   PT Start Time 7106   PT Stop Time 1115   PT Time Calculation (min) 40 min   Activity Tolerance Patient tolerated treatment well;Patient limited by pain   Behavior During Therapy [facility] for tasks assessed/performed      Past Medical History  Diagnosis Date  . Hypertension   . Esophageal stricture     s/p dilation  . Osteoarthritis   . GERD (gastroesophageal reflux disease)   . Breast cyst     Right  . Dyslipidemia   . Aortic root dilatation (HCC)     Mild  . Diabetes mellitus without complication ([place])   . Hyperlipidemia   . Osteoporosis     Past Surgical History  Procedure Laterality Date  . Partial hysterectomy    . Total knee arthroplasty      Left  . Shoulder surgery      Right  . Knee arthroscopy  2004    Left and right  . Lumbar fusion  2000,2003  . Radial head arthroplasty  5/12    orif rt radial head  . Back surgery    . Abdominal hysterectomy    . Appendectomy    . Cholecystectomy  1993  . Cardiac catheterization  2010    both cataracts  . Fracture surgery      rt wrist2010  . Kyphosis surgery    . Hardware removal  12/10/2011    Procedure: HARDWARE REMOVAL;  Surgeon: Schuyler Amor, MD;  Location: [facility];  Service: Orthopedics;  Laterality: Right;  radial head    There were no vitals filed for this visit.  Visit Diagnosis:  Unsteadiness  Weakness generalized      Subjective Assessment - 06/05/15 1038    Subjective Patient presents today amb with SPC due to increased LBP since  yesterday.    Currently in Pain? Yes   Pain Score 8    Pain Location Back   Pain Orientation Right;Left;Lower   Pain Descriptors / Indicators Sharp;Aching   Pain Type Chronic pain   Pain Radiating Towards bil hips   Pain Onset More than a month ago   Pain Frequency Intermittent   Aggravating Factors  after sitting   Pain Relieving Factors moving   Effect of Pain on Daily Activities hurts                         [facility] Adult PT Treatment/Exercise - 06/05/15 0001    Lumbar Exercises: Seated   Long Arc Quad on [place] 1 set;10 reps  on dynadisc   Hip Flexion on Ball 20 reps  on dyna disc; pelvic rocks fwd/bwd/side/side x 20 ea   Knee/Hip Exercises: Aerobic   Nustep  L3 x 13 mins for UE and LE activity. Cues for posture and drawin   Knee/Hip Exercises: Standing   Other Standing Knee Exercises toes on foam beam, standing upright 0 UE support, multiple trials  to move COG backward   Other Standing Knee Exercises standing eyes closed; semit  tandem stance 0 UE support                  PT Short Term Goals - 05/28/15 1234    PT SHORT TERM GOAL #1   Title I with initial HEP (06/11/15)   Time 2   Period Weeks   Status New   PT SHORT TERM GOAL #2   Title patient to obtain RW (06/11/15)           PT Long Term Goals - 05/28/15 1235    PT LONG TERM GOAL #1   Title I with advanced HEP   Time 6   Period Weeks   Status New   PT LONG TERM GOAL #2   Title Pt to amb safely in clinic with least restrictive AD   Time 6   Period Weeks   Status New   PT LONG TERM GOAL #3   Title improved BERG score to 46/56 or better to help reduce falls   Time 6   Period Weeks   Status New   PT LONG TERM GOAL #4   Title improve 5 times sit to stand to 20 seconds   Time 6   Period Weeks   Status New   PT LONG TERM GOAL #5   Title demo 4/5 or better BLE strength to improve function.   Time 6   Period Weeks   Status New   Additional Long Term Goals   Additional Long  Term Goals Yes   PT LONG TERM GOAL #6   Title decreased pain in back/hips to 4/10  or less with ADLs   Time 6   Period Weeks   Status New               Plan - 06/05/15 1250    Clinical Impression Statement Patient had increased back pain today limiting standing exercises. She did well with balance activities in standing but had pain with activities recquiring SLS on LLE like toe taps so worked core strength in sitting. Patient was using SPC today due to back pain and gait was much steadier. No LOB noted.   PT Next Visit Plan Gait with RW, sit to stand, standing hip abd, balance activities, work on trunk extension moving COG backward. FALL RISK - be with patient at all times.         Problem List Patient Active Problem List   Diagnosis Date Noted  . Osteoarthritis of lumbar spine 05/08/2015  . Spondylosis of lumbar region without myelopathy or radiculopathy 09/01/2014  . Diabetes ([place]) 04/26/2014  . Vitamin D deficiency 04/26/2014  . Chronic cystitis 04/26/2014  . Atrophic vaginitis 04/26/2014  . Solitary pulmonary nodule 03/20/2014  . Chest pain 03/18/2014  . Osteoporotic compression fracture of spine with routine healing 03/08/2014  . Osteoporosis with fracture 03/08/2014  . Osteoporosis, post-menopausal 03/08/2014  . Precordial pain 02/16/2012  . PALPITATIONS 02/27/2010  . CAD 12/12/2009  . Aortic aneurysm of unspecified site without mention of rupture 06/27/2009  . DYSPNEA 06/27/2009  . DYSLIPIDEMIA 12/12/2008  . Essential hypertension 12/12/2008  . GERD 12/12/2008  . OSTEOARTHRITIS 12/12/2008    Madelyn Flavors PT  06/05/2015, 12:55 PM  [facility] [address] [place], [place], [address] Phone: [phone]   Fax:  [phone]

## 2015-06-07 ENCOUNTER — Ambulatory Visit: Payer: Medicare Other | Admitting: Physical Therapy

## 2015-06-07 DIAGNOSIS — M5442 Lumbago with sciatica, left side: Secondary | ICD-10-CM

## 2015-06-07 DIAGNOSIS — R2681 Unsteadiness on feet: Secondary | ICD-10-CM

## 2015-06-07 DIAGNOSIS — R531 Weakness: Secondary | ICD-10-CM

## 2015-06-07 DIAGNOSIS — M5441 Lumbago with sciatica, right side: Secondary | ICD-10-CM

## 2015-06-07 NOTE — Therapy (Signed)
[facility] [place], [place], [address] Phone: [phone]   Fax:  [phone]  Physical Therapy Treatment  Patient Details  Name: Debra Phillips MRN: [id number] Date of Birth: May 29, 1929 Referring Provider:  Chipper Herb, MD  Encounter Date: 06/07/2015      PT End of Session - 06/07/15 1124    Visit Number 4   Number of Visits 12   Date for PT Re-Evaluation 07/09/15   PT Start Time 6378   PT Stop Time 1120   PT Time Calculation (min) 45 min   Activity Tolerance Patient tolerated treatment well   Behavior During Therapy [facility] for tasks assessed/performed      Past Medical History  Diagnosis Date  . Hypertension   . Esophageal stricture     s/p dilation  . Osteoarthritis   . GERD (gastroesophageal reflux disease)   . Breast cyst     Right  . Dyslipidemia   . Aortic root dilatation (HCC)     Mild  . Diabetes mellitus without complication ([place])   . Hyperlipidemia   . Osteoporosis     Past Surgical History  Procedure Laterality Date  . Partial hysterectomy    . Total knee arthroplasty      Left  . Shoulder surgery      Right  . Knee arthroscopy  2004    Left and right  . Lumbar fusion  2000,2003  . Radial head arthroplasty  5/12    orif rt radial head  . Back surgery    . Abdominal hysterectomy    . Appendectomy    . Cholecystectomy  1993  . Cardiac catheterization  2010    both cataracts  . Fracture surgery      rt wrist2010  . Kyphosis surgery    . Hardware removal  12/10/2011    Procedure: HARDWARE REMOVAL;  Surgeon: Schuyler Amor, MD;  Location: [place];  Service: Orthopedics;  Laterality: Right;  radial head    There were no vitals filed for this visit.  Visit Diagnosis:  Unsteadiness  Weakness generalized  Bilateral low back pain with left-sided sciatica  Bilateral low back pain with right-sided sciatica      Subjective Assessment - 06/07/15 1221    Subjective Patient reports less pain today in LB. She presents with SPC.   Currently in Pain? Yes   Pain Score 6    Pain Location Back   Pain Orientation Right;Left;Lower   Pain Descriptors / Indicators Sharp;Aching   Pain Type Chronic pain   Pain Onset More than a month ago   Pain Frequency Intermittent   Aggravating Factors  after sitting   Pain Relieving Factors moving   Effect of Pain on Daily Activities hurts            [facility] PT Assessment - 06/07/15 0001    Assessment   Medical Diagnosis Gait instability; OA lumbar with radiculopathy   Precautions   Precautions Fall   Standardized Balance Assessment   Five times sit to stand comments  22 seconds                     [facility] Adult PT Treatment/Exercise - 06/07/15 0001    Self-Care   Self-Care Other Self-Care Comments   Other Self-Care Comments  provided pt with Rx for walker and discussed plan to get it. Also attempted to adjust SPC to correct height, but it does not adjust to her height.  Lumbar Exercises: Seated   Hip Flexion on Ball 20 reps  on dyna disc; pelvic rocks fwd/bwd/side/side x 20 ea   Knee/Hip Exercises: Standing   Rocker Board 5 minutes  calf stretching and balance   Other Standing Knee Exercises toes on foam beam, standing upright 0 UE support, multiple trials  toe taps to foam x 10 ea; improved balance today with COG   Other Standing Knee Exercises side stepping on foam balance beam x 4    Knee/Hip Exercises: Seated   Sit to Sand 2 sets;5 reps                  PT Short Term Goals - 06/07/15 1110    PT SHORT TERM GOAL #1   Title I with initial HEP (06/11/15)   Time 2   Period Weeks   Status Achieved   PT SHORT TERM GOAL #2   Title patient to obtain RW (06/11/15)   Time 2   Period Days   Status On-going           PT Long Term Goals - 06/07/15 1102    PT LONG TERM GOAL #1   Title I with advanced HEP   Time 6   Period Weeks   Status On-going   PT LONG TERM GOAL  #2   Title Pt to amb safely in clinic with least restrictive AD   Period Weeks   Status On-going   PT LONG TERM GOAL #3   Title improved BERG score to 46/56 or better to help reduce falls   Time 6   Period Weeks   Status On-going   PT LONG TERM GOAL #4   Title improve 5 times sit to stand to 20 seconds  22 seconds 06/07/15   Time 6   Period Weeks   Status On-going   PT LONG TERM GOAL #5   Title demo 4/5 or better BLE strength to improve function.   Time 6   Period Weeks   Status On-going   PT LONG TERM GOAL #6   Title decreased pain in back/hips to 4/10  or less with ADLs   Time 6   Period Weeks   Status On-going               Plan - 06/07/15 1230    Clinical Impression Statement Patient presented with decreased back pain today. She amb safely with SPC in clinic without LOB. She has almost met her sit to stand goal improving her time by 10 seconds. Patient plans to acquire RW today. A copy of signed prescription for RW was given to client to take to pharmacy.   Pt will benefit from skilled therapeutic intervention in order to improve on the following deficits Decreased range of motion;Difficulty walking;Decreased safety awareness;Pain;Decreased activity tolerance;Decreased balance;Decreased strength;Postural dysfunction   Rehab Potential Good   PT Frequency 2x / week   PT Duration 6 weeks   PT Treatment/Interventions ADLs/Self Care Home Management;Cryotherapy;Electrical Stimulation;Moist Heat;Therapeutic exercise;Gait training;Stair training;Ultrasound;Balance training;Neuromuscular re-education;Patient/family education;Manual techniques;Passive range of motion   PT Next Visit Plan Gait with RW, standing hip ABD if tolerated (else s/l), continue core strengthening. FALL RISK - be with patient at all times.   Consulted and Agree with Plan of Care Patient        Problem List Patient Active Problem List   Diagnosis Date Noted  . Osteoarthritis of lumbar spine  05/08/2015  . Spondylosis of lumbar region without myelopathy or radiculopathy 09/01/2014  . Diabetes (  [place]) 04/26/2014  . Vitamin D deficiency 04/26/2014  . Chronic cystitis 04/26/2014  . Atrophic vaginitis 04/26/2014  . Solitary pulmonary nodule 03/20/2014  . Chest pain 03/18/2014  . Osteoporotic compression fracture of spine with routine healing 03/08/2014  . Osteoporosis with fracture 03/08/2014  . Osteoporosis, post-menopausal 03/08/2014  . Precordial pain 02/16/2012  . PALPITATIONS 02/27/2010  . CAD 12/12/2009  . Aortic aneurysm of unspecified site without mention of rupture 06/27/2009  . DYSPNEA 06/27/2009  . DYSLIPIDEMIA 12/12/2008  . Essential hypertension 12/12/2008  . GERD 12/12/2008  . OSTEOARTHRITIS 12/12/2008    Madelyn Flavors PT  06/07/2015, 12:40 PM  [facility] [address] [place], [place], [address] Phone: [phone]   Fax:  [phone]

## 2015-06-12 ENCOUNTER — Ambulatory Visit: Payer: Medicare Other | Admitting: *Deleted

## 2015-06-12 ENCOUNTER — Encounter: Payer: Self-pay | Admitting: *Deleted

## 2015-06-12 DIAGNOSIS — R2681 Unsteadiness on feet: Secondary | ICD-10-CM

## 2015-06-12 DIAGNOSIS — M5441 Lumbago with sciatica, right side: Secondary | ICD-10-CM | POA: Diagnosis not present

## 2015-06-12 DIAGNOSIS — M5442 Lumbago with sciatica, left side: Secondary | ICD-10-CM | POA: Diagnosis not present

## 2015-06-12 DIAGNOSIS — R531 Weakness: Secondary | ICD-10-CM | POA: Diagnosis not present

## 2015-06-12 NOTE — Therapy (Signed)
[facility] [place], [place], [address] Phone: [phone]   Fax:  [phone]  Physical Therapy Treatment  Patient Details  Name: Debra Phillips MRN: [id number] Date of Birth: [birth year] No Data Recorded  Encounter Date: 06/12/2015      PT End of Session - 06/12/15 8185    Visit Number 5   Number of Visits 12   Date for PT Re-Evaluation 07/09/15   PT Start Time 0924   PT Stop Time 0955   PT Time Calculation (min) 31 min      Past Medical History  Diagnosis Date  . Hypertension   . Esophageal stricture     s/p dilation  . Osteoarthritis   . GERD (gastroesophageal reflux disease)   . Breast cyst     Right  . Dyslipidemia   . Aortic root dilatation (HCC)     Mild  . Diabetes mellitus without complication ([place])   . Hyperlipidemia   . Osteoporosis     Past Surgical History  Procedure Laterality Date  . Partial hysterectomy    . Total knee arthroplasty      Left  . Shoulder surgery      Right  . Knee arthroscopy  2004    Left and right  . Lumbar fusion  2000,2003  . Radial head arthroplasty  5/12    orif rt radial head  . Back surgery    . Abdominal hysterectomy    . Appendectomy    . Cholecystectomy  1993  . Cardiac catheterization  2010    both cataracts  . Fracture surgery      rt wrist2010  . Kyphosis surgery    . Hardware removal  12/10/2011    Procedure: HARDWARE REMOVAL;  Surgeon: Schuyler Amor, MD;  Location: [place];  Service: Orthopedics;  Laterality: Right;  radial head    There were no vitals filed for this visit.  Visit Diagnosis:  Unsteadiness  Weakness generalized  Bilateral low back pain with left-sided sciatica  Bilateral low back pain with right-sided sciatica      Subjective Assessment - 06/12/15 0928    Subjective Pt 24 minutes late. Moving slow today. LB is doing about the same   Currently in Pain? Yes   Pain Score 6    Pain Location  Back   Pain Orientation Right;Left;Lower   Pain Descriptors / Indicators Sharp;Aching   Pain Type Chronic pain   Pain Radiating Towards Bil hips   Aggravating Factors  sitting   Pain Relieving Factors moving                         [facility] Adult PT Treatment/Exercise - 06/12/15 0001    Exercises   Exercises Lumbar;Knee/Hip   Knee/Hip Exercises: Aerobic   Nustep  L3 x 13 mins for UE and LE activity. Cues for posture and drawin   Knee/Hip Exercises: Standing   Rocker Board 5 minutes  calf stretching and balance   Other Standing Knee Exercises toes on foam beam, standing upright 0 UE support, multiple trials  toe taps to foam x 10 ea; improved balance today with COG                  PT Short Term Goals - 06/07/15 1110    PT SHORT TERM GOAL #1   Title I with initial HEP (06/11/15)   Time 2   Period Weeks   Status Achieved  PT SHORT TERM GOAL #2   Title patient to obtain RW (06/11/15)   Time 2   Period Days   Status On-going           PT Long Term Goals - 06/07/15 1102    PT LONG TERM GOAL #1   Title I with advanced HEP   Time 6   Period Weeks   Status On-going   PT LONG TERM GOAL #2   Title Pt to amb safely in clinic with least restrictive AD   Period Weeks   Status On-going   PT LONG TERM GOAL #3   Title improved BERG score to 46/56 or better to help reduce falls   Time 6   Period Weeks   Status On-going   PT LONG TERM GOAL #4   Title improve 5 times sit to stand to 20 seconds  22 seconds 06/07/15   Time 6   Period Weeks   Status On-going   PT LONG TERM GOAL #5   Title demo 4/5 or better BLE strength to improve function.   Time 6   Period Weeks   Status On-going   PT LONG TERM GOAL #6   Title decreased pain in back/hips to 4/10  or less with ADLs   Time 6   Period Weeks   Status On-going               Plan - 06/12/15 0943    Clinical Impression Statement Pt was 24 mins late today and was just lacking energy. She  did fair with balance exs, but LBP does limit her time in standing. No goals met today   Pt will benefit from skilled therapeutic intervention in order to improve on the following deficits Decreased range of motion;Difficulty walking;Decreased safety awareness;Pain;Decreased activity tolerance;Decreased balance;Decreased strength;Postural dysfunction   PT Frequency 2x / week   PT Duration 6 weeks   PT Treatment/Interventions ADLs/Self Care Home Management;Cryotherapy;Electrical Stimulation;Moist Heat;Therapeutic exercise;Gait training;Stair training;Ultrasound;Balance training;Neuromuscular re-education;Patient/family education;Manual techniques;Passive range of motion   PT Next Visit Plan Gait with RW, standing hip ABD if tolerated (else s/l), continue core strengthening. FALL RISK - be with patient at all times. Has FWW at home   Consulted and Agree with Plan of Care Patient        Problem List Patient Active Problem List   Diagnosis Date Noted  . Osteoarthritis of lumbar spine 05/08/2015  . Spondylosis of lumbar region without myelopathy or radiculopathy 09/01/2014  . Diabetes ([place]) 04/26/2014  . Vitamin D deficiency 04/26/2014  . Chronic cystitis 04/26/2014  . Atrophic vaginitis 04/26/2014  . Solitary pulmonary nodule 03/20/2014  . Chest pain 03/18/2014  . Osteoporotic compression fracture of spine with routine healing 03/08/2014  . Osteoporosis with fracture 03/08/2014  . Osteoporosis, post-menopausal 03/08/2014  . Precordial pain 02/16/2012  . PALPITATIONS 02/27/2010  . CAD 12/12/2009  . Aortic aneurysm of unspecified site without mention of rupture 06/27/2009  . DYSPNEA 06/27/2009  . DYSLIPIDEMIA 12/12/2008  . Essential hypertension 12/12/2008  . GERD 12/12/2008  . OSTEOARTHRITIS 12/12/2008    RAMSEUR,CHRIS, PTA 06/12/2015, 10:42 AM  [facility] [place], [place], [address] Phone: [phone]   Fax:   [phone]  Name: Debra Phillips MRN: [id number] Date of Birth: [birth year]

## 2015-06-15 ENCOUNTER — Encounter: Payer: Self-pay | Admitting: Pharmacist

## 2015-06-15 ENCOUNTER — Encounter: Payer: Self-pay | Admitting: Physical Therapy

## 2015-06-15 ENCOUNTER — Ambulatory Visit: Payer: Medicare Other | Admitting: Physical Therapy

## 2015-06-15 ENCOUNTER — Ambulatory Visit (INDEPENDENT_AMBULATORY_CARE_PROVIDER_SITE_OTHER): Payer: Medicare Other | Admitting: Pharmacist

## 2015-06-15 VITALS — BP 122/72 | HR 76 | Ht 62.0 in | Wt 173.5 lb

## 2015-06-15 DIAGNOSIS — E119 Type 2 diabetes mellitus without complications: Secondary | ICD-10-CM | POA: Diagnosis not present

## 2015-06-15 DIAGNOSIS — R2681 Unsteadiness on feet: Secondary | ICD-10-CM | POA: Diagnosis not present

## 2015-06-15 DIAGNOSIS — M5441 Lumbago with sciatica, right side: Secondary | ICD-10-CM

## 2015-06-15 DIAGNOSIS — Z23 Encounter for immunization: Secondary | ICD-10-CM

## 2015-06-15 DIAGNOSIS — M5442 Lumbago with sciatica, left side: Secondary | ICD-10-CM

## 2015-06-15 DIAGNOSIS — Z0000 Encounter for general adult medical examination without abnormal findings: Secondary | ICD-10-CM

## 2015-06-15 DIAGNOSIS — R531 Weakness: Secondary | ICD-10-CM

## 2015-06-15 LAB — POCT UA - MICROSCOPIC ONLY
Bacteria, U Microscopic: NEGATIVE
Casts, Ur, LPF, POC: NEGATIVE
Crystals, Ur, HPF, POC: NEGATIVE
Mucus, UA: NEGATIVE
RBC, URINE, MICROSCOPIC: NEGATIVE
WBC, UR, HPF, POC: NEGATIVE
YEAST UA: NEGATIVE

## 2015-06-15 NOTE — Therapy (Signed)
[facility] [place], [place], [address] Phone: [phone]   Fax:  [phone]  Physical Therapy Treatment  Patient Details  Name: Debra Phillips MRN: [id number] Date of Birth: May 24, 1929 Referring Provider: Dr. Laurance Flatten  Encounter Date: 06/15/2015      PT End of Session - 06/15/15 1033    Visit Number 6   Number of Visits 12   Date for PT Re-Evaluation 07/09/15   PT Start Time 1032   PT Stop Time 1111   PT Time Calculation (min) 39 min   Activity Tolerance Patient tolerated treatment well   Behavior During Therapy [facility] for tasks assessed/performed      Past Medical History  Diagnosis Date  . Hypertension   . Esophageal stricture     s/p dilation  . Osteoarthritis   . GERD (gastroesophageal reflux disease)   . Breast cyst     Right  . Dyslipidemia   . Aortic root dilatation (HCC)     Mild  . Diabetes mellitus without complication ([place])   . Hyperlipidemia   . Osteoporosis     Past Surgical History  Procedure Laterality Date  . Partial hysterectomy    . Total knee arthroplasty      Left  . Shoulder surgery      Right  . Knee arthroscopy  2004    Left and right  . Lumbar fusion  2000,2003  . Radial head arthroplasty  5/12    orif rt radial head  . Back surgery    . Abdominal hysterectomy    . Appendectomy    . Cholecystectomy  1993  . Cardiac catheterization  2010    both cataracts  . Fracture surgery      rt wrist2010  . Kyphosis surgery    . Hardware removal  12/10/2011    Procedure: HARDWARE REMOVAL;  Surgeon: Schuyler Amor, MD;  Location: [place];  Service: Orthopedics;  Laterality: Right;  radial head    There were no vitals filed for this visit.  Visit Diagnosis:  Unsteadiness  Weakness generalized  Bilateral low back pain with left-sided sciatica  Bilateral low back pain with right-sided sciatica      Subjective Assessment - 06/15/15 1113    Subjective  Patient states that she feels alright today and her back isn't hurting "too bad." Reports that the walker that was ordered was at home.   Currently in Pain? Other (Comment)  Gave no pain rating prior to therapy.            [facility] PT Assessment - 06/15/15 0001    Assessment   Medical Diagnosis Gait instability; OA lumbar with radiculopathy   Referring Provider Dr. Laurance Flatten   Next MD Visit 4 months   Precautions   Precautions [facility] Adult PT Treatment/Exercise - 06/15/15 0001    Lumbar Exercises: Aerobic   Stationary Bike NuStep L4 x15 min   Lumbar Exercises: Seated   Sit to Stand 20 reps  without UE support   Knee/Hip Exercises: Standing   Rocker Board 2 minutes             Balance Exercises - 06/15/15 1115    Balance Exercises: Standing   Standing Eyes Opened Narrow base of support (BOS);Foam/compliant surface  x4 min   Tandem Stance Eyes open;Foam/compliant surface;Upper extremity support 1  x2 min  Rockerboard EO;Anterior/posterior;Intermittent UE support  x3 min   Other Standing Exercises B toes on foam one to no UE support x5 min             PT Short Term Goals - 06/07/15 1110    PT SHORT TERM GOAL #1   Title I with initial HEP (06/11/15)   Time 2   Period Weeks   Status Achieved   PT SHORT TERM GOAL #2   Title patient to obtain RW (06/11/15)   Time 2   Period Days   Status On-going           PT Long Term Goals - 06/07/15 1102    PT LONG TERM GOAL #1   Title I with advanced HEP   Time 6   Period Weeks   Status On-going   PT LONG TERM GOAL #2   Title Pt to amb safely in clinic with least restrictive AD   Period Weeks   Status On-going   PT LONG TERM GOAL #3   Title improved BERG score to 46/56 or better to help reduce falls   Time 6   Period Weeks   Status On-going   PT LONG TERM GOAL #4   Title improve 5 times sit to stand to 20 seconds  22 seconds 06/07/15   Time 6   Period Weeks   Status  On-going   PT LONG TERM GOAL #5   Title demo 4/5 or better BLE strength to improve function.   Time 6   Period Weeks   Status On-going   PT LONG TERM GOAL #6   Title decreased pain in back/hips to 4/10  or less with ADLs   Time 6   Period Weeks   Status On-going               Plan - 06/15/15 1116    Clinical Impression Statement Patient tolerated today's treatment fairly well with all balance exercises and therapeutic exercises. Completed all exercises as directed with verbal cueing and demonstration for correct technique. Did well with balance exercises where one to no UE support were utilized. Demonstrated minimal difficulty with sit to stands without UE support. By the end of the treatment she reported her low back and legs felt like a sponge and reported 8/10 low back pain but stated it would get better.   Pt will benefit from skilled therapeutic intervention in order to improve on the following deficits Decreased range of motion;Difficulty walking;Decreased safety awareness;Pain;Decreased activity tolerance;Decreased balance;Decreased strength;Postural dysfunction   Rehab Potential Good   PT Frequency 2x / week   PT Duration 6 weeks   PT Treatment/Interventions ADLs/Self Care Home Management;Cryotherapy;Electrical Stimulation;Moist Heat;Therapeutic exercise;Gait training;Stair training;Ultrasound;Balance training;Neuromuscular re-education;Patient/family education;Manual techniques;Passive range of motion   PT Next Visit Plan Gait with RW, standing hip ABD if tolerated (else s/l), continue core strengthening. FALL RISK - be with patient at all times. Has FWW at home   Consulted and Agree with Plan of Care Patient        Problem List Patient Active Problem List   Diagnosis Date Noted  . Osteoarthritis of lumbar spine 05/08/2015  . Spondylosis of lumbar region without myelopathy or radiculopathy 09/01/2014  . Diabetes ([place]) 04/26/2014  . Vitamin D deficiency 04/26/2014  .  Chronic cystitis 04/26/2014  . Atrophic vaginitis 04/26/2014  . Solitary pulmonary nodule 03/20/2014  . Chest pain 03/18/2014  . Osteoporotic compression fracture of spine with routine healing 03/08/2014  . Osteoporosis with fracture 03/08/2014  .  Osteoporosis, post-menopausal 03/08/2014  . Precordial pain 02/16/2012  . PALPITATIONS 02/27/2010  . CAD 12/12/2009  . Aortic aneurysm of unspecified site without mention of rupture 06/27/2009  . DYSPNEA 06/27/2009  . DYSLIPIDEMIA 12/12/2008  . Essential hypertension 12/12/2008  . GERD 12/12/2008  . OSTEOARTHRITIS 12/12/2008    Wynelle Fanny, PTA 06/15/2015, 11:25 AM  [facility] [address] [place], [place], [address] Phone: [phone]   Fax:  [phone]  Name: Debra Phillips MRN: [id number] Date of Birth: 01-Jun-1929

## 2015-06-15 NOTE — Progress Notes (Signed)
Patient ID: Debra Phillips, female   DOB: 1928-11-09, 81 y.o.   MRN: [id number]    Subjective:   Debra Phillips is a 81 y.o. white, female who presents for a Subsequent Medicare Annual Wellness Visit. Debra Phillips lives alone and is widowed.  She has 5 children - 3 female and 2 female.  She has 3 children that live close by and are very supportive.  Debra Phillips make concern is her balance and she is afraid of falling.  She has been seeing PT about 2 times per week for balance assessment and treatment.  She has osteoporosis and receives Reclast treatments yearly.  She is UTD on BMD.  Current Medications (verified) Outpatient Encounter Prescriptions as of 06/15/2015  Medication Sig  . aspirin 81 MG tablet Take 81 mg by mouth every evening.   . Calcium Carb-Cholecalciferol 600-500 MG-UNIT CAPS Take 1 capsule by mouth every morning.  . Cholecalciferol 1000 UNITS tablet Take 1,000 Units by mouth daily.   . Cinnamon 500 MG capsule Take 500 mg by mouth daily.   Marland Kitchen diltiazem (CARDIZEM CD) 180 MG 24 hr capsule TAKE ONE CAPSULE BY MOUTH ONE TIME DAILY  . ESTRING 2 MG vaginal ring   . Flaxseed, Linseed, (FLAX SEED OIL PO) Take 1 tablet by mouth daily.  Marland Kitchen lisinopril-hydrochlorothiazide (PRINZIDE,ZESTORETIC) 20-12.5 MG per tablet TAKE ONE TABLET BY MOUTH ONE TIME DAILY  . metFORMIN (GLUCOPHAGE) 500 MG tablet TAKE ONE TABLET BY MOUTH TWICE DAILY  . multivitamin (THERAGRAN) per tablet Take 1 tablet by mouth daily.    . Omega-3 Fatty Acids (FISH OIL) 1000 MG CAPS Take 2,000 mg by mouth at bedtime.  Marland Kitchen omeprazole (PRILOSEC) 40 MG capsule Take 40 mg by mouth 2 (two) times daily.  . ONE TOUCH ULTRA TEST test strip USE TO CHECK BLOOD GLUCOSE ONCE DAILY  . ONETOUCH DELICA LANCETS 29U MISC USE TO CHECK BLOOD SUGAR ONCE DAILY  . Probiotic Product (PROBIOTIC DAILY PO) Take 1 capsule by mouth daily.  . traMADol (ULTRAM) 50 MG tablet Take 50 mg by mouth at bedtime as needed for moderate pain.   Marland Kitchen trimethoprim  (TRIMPEX) 100 MG tablet Take 100 mg by mouth at bedtime.  . Zoledronic Acid (RECLAST IV) Inject into the vein. Administered by Dr Carloyn Manner at [facility]  . fluticasone [facility]) 50 MCG/ACT nasal spray Place 2 sprays into both nostrils daily. (Patient not taking: Reported on 06/15/2015)  . furosemide (LASIX) 20 MG tablet Take 40 mg by mouth daily as needed for fluid or edema.  . [DISCONTINUED] ciprofloxacin (CIPRO) 500 MG tablet Take 1 tablet (500 mg total) by mouth 2 (two) times daily. (Patient not taking: Reported on 06/15/2015)  . [DISCONTINUED] dexlansoprazole (DEXILANT) 60 MG capsule Take 60 mg by mouth daily.  . [DISCONTINUED] PROLIA 60 MG/ML SOLN injection    No facility-administered encounter medications on file as of 06/15/2015.    Allergies (verified) Codeine; Doxycycline; Other; Sulfonamide derivatives; and Vancomycin   History: Past Medical History  Diagnosis Date  . Hypertension   . Esophageal stricture     s/p dilation  . Osteoarthritis   . GERD (gastroesophageal reflux disease)   . Breast cyst     Right  . Dyslipidemia   . Aortic root dilatation (HCC)     Mild  . Diabetes mellitus without complication ([place])   . Hyperlipidemia   . Osteoporosis   . Cataract   . Macular pucker, left eye    Past Surgical History  Procedure Laterality Date  .  Partial hysterectomy    . Total knee arthroplasty      Left  . Shoulder surgery      Right  . Knee arthroscopy  2004    Left and right  . Lumbar fusion  2000,2003  . Radial head arthroplasty  5/12    orif rt radial head  . Back surgery    . Abdominal hysterectomy    . Appendectomy    . Cholecystectomy  1993  . Cardiac catheterization  2010    both cataracts  . Fracture surgery      rt wrist2010  . Kyphosis surgery    . Hardware removal  12/10/2011    Procedure: HARDWARE REMOVAL;  Surgeon: Schuyler Amor, MD;  Location: [place];  Service: Orthopedics;  Laterality: Right;  radial head  . Eye  surgery      cataracts  . Eye surgery      for macular pucker   Family History  Problem Relation Age of Onset  . Pneumonia Mother   . Brain cancer Father   . Arthritis Sister   . COPD Sister   . Emphysema Brother   . COPD Brother   . Lung cancer Brother   . Heart disease Brother   . Liver cancer Brother   . Diabetes Other      1 child has diabetes at age 83 and the other 56 are healthy   Social History   Occupational History  . 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 Smoking status: Never Smoker   . Smokeless tobacco: Never Used  . Alcohol Use: No  . Drug Use: No  . Sexual Activity: No    Do you feel safe at home?  Yes  Dietary issues and exercise activities: Current Exercise Habits:: Exercise is limited by, Limited by:: orthopedic condition(s)  Current Dietary habits:  Breakfast - cereal, 2%milk ("I can't tolerate lower fat amount"), fruit Lunch and dinner - vegetables, usuallly Snacks - peanut butter crackers (3) or yogurt  Objective:    Today's Vitals   06/15/15 1159  BP: 122/72  Pulse: 76  Height: 5\' 2"  (1.575 m)  Weight: 173 lb 8 oz (78.699 kg)  PainSc: 4   PainLoc: Back   Body mass index is 31.73 kg/(m^2).  Activities of Daily Living In your present state of health, do you have any difficulty performing the following activities: 06/15/2015  Hearing? N  Vision? Y  Difficulty concentrating or making decisions? N  Walking or climbing stairs? Y  Dressing or bathing? N  Doing errands, shopping? N  Preparing Food and eating ? N  Using the Toilet? N  In the past six months, have you accidently leaked urine? N  Do you have problems with loss of bowel control? N  Managing your Medications? N  Managing your Finances? N  Housekeeping or managing your Housekeeping? N   Are there smokers in your home (other than you)? No   Cardiac Risk Factors include: advanced age (>74men, >6 women);diabetes  mellitus;dyslipidemia;hypertension;obesity (BMI >30kg/m2);sedentary lifestyle  Depression Screen PHQ 2/9 Scores 06/15/2015 05/08/2015 01/01/2015 09/01/2014  PHQ - 2 Score 1 0 0 2  PHQ- 9 Score - - - 3    Fall Risk Fall Risk  06/15/2015 05/08/2015 01/01/2015 09/01/2014 04/26/2014  Falls in the past year? Yes Yes Yes No No  Number falls in past yr: 2 or more 2 or more 1 - -  Injury with Fall? No Yes No - -  Risk Factor Category  High Fall Risk - - - -  Follow up Falls prevention discussed - - - -    Cognitive Function: MMSE - Mini Mental State Exam 06/15/2015  Orientation to time 4  Orientation to Place 5  Registration 3  Attention/ Calculation 5  Recall 2  Language- name 2 objects 2  Language- repeat 1  Language- follow 3 step command 3  Language- read & follow direction 1  Write a sentence 1  Copy design 1  Total score 28    Immunizations and Health Maintenance Immunization History  Administered Date(s) Administered  . Influenza-Unspecified 04/25/2013, 05/10/2014  . Pneumococcal Conjugate-13 08/24/2013  . Zoster 03/17/2014   Health Maintenance Due  Topic Date Due  . INFLUENZA VACCINE  03/26/2015  . OPHTHALMOLOGY EXAM  05/26/2015    Patient Care Team: Chipper Herb, MD as PCP - General (Family Medicine) Irine Seal, MD as Consulting Physician (Urology) Minus Breeding, MD as Consulting Physician (Cardiology) Glenna Fellows, MD as Attending Physician (Neurosurgery) Dorene Ar, MD as Consulting Physician (Pain Medicine) Taras Olivia Canter, MD as Consulting Physician (Ophthalmology)  Indicate any recent Medical Services you may have received from other than [facility] providers in the past year (date may be approximate).    Assessment:    Annual Wellness Visit  Osteoporosis Instability / high fall risk Hyperlipidemia, mixed with elevated LDL and Tg- refuses statin   Screening Tests Health Maintenance  Topic Date Due  . INFLUENZA VACCINE  03/26/2015  . OPHTHALMOLOGY EXAM   05/26/2015  . PNA vac Low Risk Adult (2 of 2 - PPSV23) 07/04/2015 (Originally 08/24/2014)  . TETANUS/TDAP  08/26/2015  . URINE MICROALBUMIN  09/02/2015  . HEMOGLOBIN A1C  11/05/2015  . FOOT EXAM  05/07/2016  . DEXA SCAN  Completed  . ZOSTAVAX  Completed        Plan:   During the course of the visit Debra Phillips was educated and counseled about the following appropriate screening and preventive services:   Received influenza vaccine in office today  Colorectal cancer screening  BP - controlled  Lipids - LDL was slightly elevated, Triglycerides elevated and since patient has DM recommended statin therapy but she refused.   Diabetes screening - checking urine protein today  Bone Denisty / Osteoporosis Screening  Mammogram - patient reminded that this is due.  She usually goes with her daughter and she will get her daughter to schedule  Glaucoma screening / Diabetic Eye Exam - UTD but need copy of last visit with Tennis Must at [facility] in [place].  Request made  Nutrition counseling - discussed limiting high fat and CHO foods.  Increase non starchy vegetable, fruits, whole grains and proteins.  Advanced Directives - information given and reviewed in office today  Encourage to take walker to physical therapy so they can assess ability to use. Orders Placed This Encounter  Procedures  . Flu Vaccine QUAD 36+ mos IM  . Protein / Creatinine Ratio, Urine  . POCT UA - Microscopic Only     Patient Instructions (the written plan) were given to the patient.   Cherre Robins, [facility]   06/15/2015

## 2015-06-15 NOTE — Patient Instructions (Addendum)
Ms. Debra Phillips , Thank you for taking time to come for your Medicare Wellness Visit. I appreciate your ongoing commitment to your health goals. Please review the following plan we discussed and let me know if I can assist you in the future.   These are the goals we discussed:   Increase non-starchy vegetables - carrots, green bean, squash, zucchini, tomatoes, onions, peppers, spinach and other green leafy vegetables, cabbage, lettuce, cucumbers, asparagus, okra (not fried), eggplant limit sugar and processed foods (cakes, cookies, ice cream, crackers and chips) Increase fresh fruit but limit serving sizes 1/2 cup or about the size of tennis or baseball limit red meat to no more than 1-2 times per week (serving size about the size of your palm) Choose whole grains / lean proteins - whole wheat bread, quinoa, whole grain rice (1/2 cup), fish, chicken, [place]  COntinue with physical therapy to help with balance.    This is a list of the screening recommended for you and due dates:  Health Maintenance  Topic Date Due  . Eye exam for diabetics  05/26/2015  . Pneumonia vaccines (2 of 2 - PPSV23) 07/04/2015*  . Tetanus Vaccine  08/26/2015  . Urine Protein Check  09/02/2015  . Hemoglobin A1C  11/05/2015  . DEXA scan (bone density measurement)  03/08/2016  . Flu Shot  03/25/2016  . Complete foot exam   05/07/2016  . Shingles Vaccine  Completed  *Topic was postponed. The date shown is not the original due date.    Fall Prevention in the Home  Falls can cause injuries and can affect people from all age groups. There are many simple things that you can do to make your home safe and to help prevent falls. WHAT CAN I DO ON THE OUTSIDE OF MY HOME?  Regularly repair the edges of walkways and driveways and fix any cracks.  Remove high doorway thresholds.  Trim any shrubbery on the main path into your home.  Use bright outdoor lighting.  Clear walkways of debris and clutter, including tools  and rocks.  Regularly check that handrails are securely fastened and in good repair. Both sides of any steps should have handrails.  Install guardrails along the edges of any raised decks or porches.  Have leaves, snow, and ice cleared regularly.  Use sand or salt on walkways during winter months.  In the garage, clean up any spills right away, including grease or oil spills. WHAT CAN I DO IN THE BATHROOM?  Use night lights.  Install grab bars by the toilet and in the tub and shower. Do not use towel bars as grab bars.  Use non-skid mats or decals on the floor of the tub or shower.  If you need to sit down while you are in the shower, use a plastic, non-slip stool.Marland Kitchen  Keep the floor dry. Immediately clean up any water that spills on the floor.  Remove soap buildup in the tub or shower on a regular basis.  Attach bath mats securely with double-sided non-slip rug tape.  Remove throw rugs and other tripping hazards from the floor. WHAT CAN I DO IN THE BEDROOM?  Use night lights.  Make sure that a bedside light is easy to reach.  Do not use oversized bedding that drapes onto the floor.  Have a firm chair that has side arms to use for getting dressed.  Remove throw rugs and other tripping hazards from the floor. WHAT CAN I DO IN THE KITCHEN?   Clean  up any spills right away.  Avoid walking on wet floors.  Place frequently used items in easy-to-reach places.  If you need to reach for something above you, use a sturdy step stool that has a grab bar.  Keep electrical cables out of the way.  Do not use floor polish or wax that makes floors slippery. If you have to use wax, make sure that it is non-skid floor wax.  Remove throw rugs and other tripping hazards from the floor. WHAT CAN I DO IN THE STAIRWAYS?  Do not leave any items on the stairs.  Make sure that there are handrails on both sides of the stairs. Fix handrails that are broken or loose. Make sure that  handrails are as long as the stairways.  Check any carpeting to make sure that it is firmly attached to the stairs. Fix any carpet that is loose or worn.  Avoid having throw rugs at the top or bottom of stairways, or secure the rugs with carpet tape to prevent them from moving.  Make sure that you have a light switch at the top of the stairs and the bottom of the stairs. If you do not have them, have them installed. WHAT ARE SOME OTHER FALL PREVENTION TIPS?  Wear closed-toe shoes that fit well and support your feet. Wear shoes that have rubber soles or low heels.  When you use a stepladder, make sure that it is completely opened and that the sides are firmly locked. Have someone hold the ladder while you are using it. Do not climb a closed stepladder.  Add color or contrast paint or tape to grab bars and handrails in your home. Place contrasting color strips on the first and last steps.  Use mobility aids as needed, such as canes, walkers, scooters, and crutches.  Turn on lights if it is dark. Replace any light bulbs that burn out.  Set up furniture so that there are clear paths. Keep the furniture in the same spot.  Fix any uneven floor surfaces.  Choose a carpet design that does not hide the edge of steps of a stairway.  Be aware of any and all pets.  Review your medicines with your healthcare provider. Some medicines can cause dizziness or changes in blood pressure, which increase your risk of falling. Talk with your health care provider about other ways that you can decrease your risk of falls. This may include working with a physical therapist or trainer to improve your strength, balance, and endurance.   This information is not intended to replace advice given to you by your health care provider. Make sure you discuss any questions you have with your health care provider.   Document Released: 08/01/2002 Document Revised: 12/26/2014 Document Reviewed: 09/15/2014 Elsevier  Interactive Patient Education [employer].

## 2015-06-16 LAB — PROTEIN / CREATININE RATIO, URINE
CREATININE, UR: 43.2 mg/dL
PROTEIN UR: 6.2 mg/dL
PROTEIN/CREAT RATIO: 144 mg/g{creat} (ref 0–200)

## 2015-06-19 ENCOUNTER — Ambulatory Visit: Payer: Medicare Other | Admitting: *Deleted

## 2015-06-19 ENCOUNTER — Encounter: Payer: Self-pay | Admitting: *Deleted

## 2015-06-19 DIAGNOSIS — M5441 Lumbago with sciatica, right side: Secondary | ICD-10-CM

## 2015-06-19 DIAGNOSIS — R531 Weakness: Secondary | ICD-10-CM

## 2015-06-19 DIAGNOSIS — R2681 Unsteadiness on feet: Secondary | ICD-10-CM

## 2015-06-19 DIAGNOSIS — M5442 Lumbago with sciatica, left side: Secondary | ICD-10-CM | POA: Diagnosis not present

## 2015-06-19 NOTE — Therapy (Signed)
[facility] [place], [place], [address] Phone: [phone]   Fax:  [phone]  Physical Therapy Treatment  Patient Details  Name: Debra Phillips MRN: [id number] Date of Birth: 1929-08-19 Referring Provider: Dr. Laurance Flatten  Encounter Date: 06/19/2015      PT End of Session - 06/19/15 1559    Visit Number 7   Number of Visits 12   Date for PT Re-Evaluation 07/09/15   PT Start Time 1520   PT Stop Time 1610   PT Time Calculation (min) 50 min      Past Medical History  Diagnosis Date  . Hypertension   . Esophageal stricture     s/p dilation  . Osteoarthritis   . GERD (gastroesophageal reflux disease)   . Breast cyst     Right  . Dyslipidemia   . Aortic root dilatation (HCC)     Mild  . Diabetes mellitus without complication ([place])   . Hyperlipidemia   . Osteoporosis   . Cataract   . Macular pucker, left eye     Past Surgical History  Procedure Laterality Date  . Partial hysterectomy    . Total knee arthroplasty      Left  . Shoulder surgery      Right  . Knee arthroscopy  2004    Left and right  . Lumbar fusion  2000,2003  . Radial head arthroplasty  5/12    orif rt radial head  . Back surgery    . Abdominal hysterectomy    . Appendectomy    . Cholecystectomy  1993  . Cardiac catheterization  2010    both cataracts  . Fracture surgery      rt wrist2010  . Kyphosis surgery    . Hardware removal  12/10/2011    Procedure: HARDWARE REMOVAL;  Surgeon: Schuyler Amor, MD;  Location: [place];  Service: Orthopedics;  Laterality: Right;  radial head  . Eye surgery      cataracts  . Eye surgery      for macular pucker    There were no vitals filed for this visit.  Visit Diagnosis:  Unsteadiness  Weakness generalized  Bilateral low back pain with left-sided sciatica  Bilateral low back pain with right-sided sciatica      Subjective Assessment - 06/19/15 1522    Subjective  (p) Patient states that she feels alright today and her back isn't hurting "too bad." Reports that the walker that was ordered was at home.   Patient Stated Goals (p) to reduce falling, to keep moving   Currently in Pain? (p) Yes   Pain Onset (p) More than a month ago   Pain Frequency (p) Intermittent                         [facility] Adult PT Treatment/Exercise - 06/19/15 0001    Exercises   Exercises Lumbar;Knee/Hip   Lumbar Exercises: Aerobic   Stationary Bike NuStep L4 x20 min   Lumbar Exercises: Seated   Sit to Stand 5 reps  only 5 today due to pt being tired x 5 in 24 secs   Knee/Hip Exercises: Standing   Rocker Board 5 minutes  calf stretching and balance, CGA/SBA   Other Standing Knee Exercises 6 inch block Toe taps 3x10 with CGA   Other Standing Knee Exercises Airex pad  balance with reaching, Eyes open/closed, Wt shifting      Gait with FWW  in clinic             PT Short Term Goals - 06/07/15 1110    PT SHORT TERM GOAL #1   Title I with initial HEP (06/11/15)   Time 2   Period Weeks   Status Achieved   PT SHORT TERM GOAL #2   Title patient to obtain RW (06/11/15)   Time 2   Period Days   Status On-going           PT Long Term Goals - 06/07/15 1102    PT LONG TERM GOAL #1   Title I with advanced HEP   Time 6   Period Weeks   Status On-going   PT LONG TERM GOAL #2   Title Pt to amb safely in clinic with least restrictive AD   Period Weeks   Status On-going   PT LONG TERM GOAL #3   Title improved BERG score to 46/56 or better to help reduce falls   Time 6   Period Weeks   Status On-going   PT LONG TERM GOAL #4   Title improve 5 times sit to stand to 20 seconds  22 seconds 06/07/15   Time 6   Period Weeks   Status On-going   PT LONG TERM GOAL #5   Title demo 4/5 or better BLE strength to improve function.   Time 6   Period Weeks   Status On-going   PT LONG TERM GOAL #6   Title decreased pain in back/hips to 4/10  or less  with ADLs   Time 6   Period Weeks   Status On-going               Problem List Patient Active Problem List   Diagnosis Date Noted  . Osteoarthritis of lumbar spine 05/08/2015  . Spondylosis of lumbar region without myelopathy or radiculopathy 09/01/2014  . Diabetes ([place]) 04/26/2014  . Vitamin D deficiency 04/26/2014  . Chronic cystitis 04/26/2014  . Atrophic vaginitis 04/26/2014  . Solitary pulmonary nodule 03/20/2014  . Chest pain 03/18/2014  . Osteoporotic compression fracture of spine with routine healing 03/08/2014  . Precordial pain 02/16/2012  . PALPITATIONS 02/27/2010  . CAD 12/12/2009  . Aortic aneurysm of unspecified site without mention of rupture 06/27/2009  . DYSPNEA 06/27/2009  . DYSLIPIDEMIA 12/12/2008  . Essential hypertension 12/12/2008  . GERD 12/12/2008  . OSTEOARTHRITIS 12/12/2008    RAMSEUR,CHRIS, PTA 06/19/2015, 4:10 PM  [facility] [place], [place], [address] Phone: [phone]   Fax:  [phone]  Name: Debra Phillips MRN: [id number] Date of Birth: 1929-03-12

## 2015-06-21 ENCOUNTER — Ambulatory Visit: Payer: Medicare Other | Admitting: Physical Therapy

## 2015-06-21 DIAGNOSIS — R2681 Unsteadiness on feet: Secondary | ICD-10-CM

## 2015-06-21 DIAGNOSIS — R531 Weakness: Secondary | ICD-10-CM

## 2015-06-21 DIAGNOSIS — M5442 Lumbago with sciatica, left side: Secondary | ICD-10-CM | POA: Diagnosis not present

## 2015-06-21 DIAGNOSIS — M5441 Lumbago with sciatica, right side: Secondary | ICD-10-CM | POA: Diagnosis not present

## 2015-06-21 NOTE — Therapy (Signed)
[facility] [place], [place], [address] Phone: [phone]   Fax:  [phone]  Physical Therapy Treatment  Patient Details  Name: Debra Phillips MRN: [id number] Date of Birth: 12/12/1928 Referring Provider: Dr. Laurance Flatten  Encounter Date: 06/21/2015      PT End of Session - 06/21/15 1529    Visit Number 8   Number of Visits 12   Date for PT Re-Evaluation 07/09/15   PT Start Time 0238   PT Stop Time 0322   PT Time Calculation (min) 44 min      Past Medical History  Diagnosis Date  . Hypertension   . Esophageal stricture     s/p dilation  . Osteoarthritis   . GERD (gastroesophageal reflux disease)   . Breast cyst     Right  . Dyslipidemia   . Aortic root dilatation (HCC)     Mild  . Diabetes mellitus without complication ([place])   . Hyperlipidemia   . Osteoporosis   . Cataract   . Macular pucker, left eye     Past Surgical History  Procedure Laterality Date  . Partial hysterectomy    . Total knee arthroplasty      Left  . Shoulder surgery      Right  . Knee arthroscopy  2004    Left and right  . Lumbar fusion  2000,2003  . Radial head arthroplasty  5/12    orif rt radial head  . Back surgery    . Abdominal hysterectomy    . Appendectomy    . Cholecystectomy  1993  . Cardiac catheterization  2010    both cataracts  . Fracture surgery      rt wrist2010  . Kyphosis surgery    . Hardware removal  12/10/2011    Procedure: HARDWARE REMOVAL;  Surgeon: Schuyler Amor, MD;  Location: [place];  Service: Orthopedics;  Laterality: Right;  radial head  . Eye surgery      cataracts  . Eye surgery      for macular pucker    There were no vitals filed for this visit.  Visit Diagnosis:  Unsteadiness  Weakness generalized                                 PT Short Term Goals - 06/07/15 1110    PT SHORT TERM GOAL #1   Title I with initial HEP (06/11/15)   Time 2   Period Weeks   Status Achieved   PT SHORT TERM GOAL #2   Title patient to obtain RW (06/11/15)   Time 2   Period Days   Status On-going           PT Long Term Goals - 06/07/15 1102    PT LONG TERM GOAL #1   Title I with advanced HEP   Time 6   Period Weeks   Status On-going   PT LONG TERM GOAL #2   Title Pt to amb safely in clinic with least restrictive AD   Period Weeks   Status On-going   PT LONG TERM GOAL #3   Title improved BERG score to 46/56 or better to help reduce falls   Time 6   Period Weeks   Status On-going   PT LONG TERM GOAL #4   Title improve 5 times sit to stand to 20 seconds  22 seconds 06/07/15  Time 6   Period Weeks   Status On-going   PT LONG TERM GOAL #5   Title demo 4/5 or better BLE strength to improve function.   Time 6   Period Weeks   Status On-going   PT LONG TERM GOAL #6   Title decreased pain in back/hips to 4/10  or less with ADLs   Time 6   Period Weeks   Status On-going               Problem List Patient Active Problem List   Diagnosis Date Noted  . Osteoarthritis of lumbar spine 05/08/2015  . Spondylosis of lumbar region without myelopathy or radiculopathy 09/01/2014  . Diabetes ([place]) 04/26/2014  . Vitamin D deficiency 04/26/2014  . Chronic cystitis 04/26/2014  . Atrophic vaginitis 04/26/2014  . Solitary pulmonary nodule 03/20/2014  . Chest pain 03/18/2014  . Osteoporotic compression fracture of spine with routine healing 03/08/2014  . Precordial pain 02/16/2012  . PALPITATIONS 02/27/2010  . CAD 12/12/2009  . Aortic aneurysm of unspecified site without mention of rupture 06/27/2009  . DYSPNEA 06/27/2009  . DYSLIPIDEMIA 12/12/2008  . Essential hypertension 12/12/2008  . GERD 12/12/2008  . OSTEOARTHRITIS 12/12/2008   Treatment:  Nustep level 5 x 27 minutes f/b Rockerboard and AIREX balance pad x 12 minutes total.  Debra Phillips, [place] MPT 06/21/2015, 3:34 PM  [facility] [place], [place], [address] Phone: [phone]   Fax:  [phone]  Name: Debra Phillips MRN: [id number] Date of Birth: [birth year]

## 2015-06-25 DIAGNOSIS — Z1231 Encounter for screening mammogram for malignant neoplasm of breast: Secondary | ICD-10-CM | POA: Diagnosis not present

## 2015-06-25 LAB — HM MAMMOGRAPHY: HM MAMMO: NEGATIVE

## 2015-06-26 ENCOUNTER — Ambulatory Visit: Payer: Medicare Other | Attending: Family Medicine | Admitting: Physical Therapy

## 2015-06-26 DIAGNOSIS — R2681 Unsteadiness on feet: Secondary | ICD-10-CM | POA: Insufficient documentation

## 2015-06-26 DIAGNOSIS — R531 Weakness: Secondary | ICD-10-CM | POA: Insufficient documentation

## 2015-06-26 DIAGNOSIS — M5441 Lumbago with sciatica, right side: Secondary | ICD-10-CM | POA: Diagnosis not present

## 2015-06-26 DIAGNOSIS — M5442 Lumbago with sciatica, left side: Secondary | ICD-10-CM | POA: Insufficient documentation

## 2015-06-26 NOTE — Therapy (Signed)
[facility] [place], [place], [address] Phone: [phone]   Fax:  [phone]  Physical Therapy Treatment  Patient Details  Name: Debra Phillips MRN: [id number] Date of Birth: 1929/02/27 Referring Provider: Dr. Laurance Flatten  Encounter Date: 06/26/2015      PT End of Session - 06/26/15 1704    PT Start Time 0233   PT Stop Time 0318   PT Time Calculation (min) 45 min   Activity Tolerance Patient tolerated treatment well   Behavior During Therapy [facility] for tasks assessed/performed      Past Medical History  Diagnosis Date  . Hypertension   . Esophageal stricture     s/p dilation  . Osteoarthritis   . GERD (gastroesophageal reflux disease)   . Breast cyst     Right  . Dyslipidemia   . Aortic root dilatation (HCC)     Mild  . Diabetes mellitus without complication ([place])   . Hyperlipidemia   . Osteoporosis   . Cataract   . Macular pucker, left eye     Past Surgical History  Procedure Laterality Date  . Partial hysterectomy    . Total knee arthroplasty      Left  . Shoulder surgery      Right  . Knee arthroscopy  2004    Left and right  . Lumbar fusion  2000,2003  . Radial head arthroplasty  5/12    orif rt radial head  . Back surgery    . Abdominal hysterectomy    . Appendectomy    . Cholecystectomy  1993  . Cardiac catheterization  2010    both cataracts  . Fracture surgery      rt wrist2010  . Kyphosis surgery    . Hardware removal  12/10/2011    Procedure: HARDWARE REMOVAL;  Surgeon: Schuyler Amor, MD;  Location: [facility];  Service: Orthopedics;  Laterality: Right;  radial head  . Eye surgery      cataracts  . Eye surgery      for macular pucker    There were no vitals filed for this visit.  Visit Diagnosis:  Unsteadiness  Weakness generalized      Subjective Assessment - 06/26/15 1700    Subjective Balance feels good today.                                    PT Short Term Goals - 06/07/15 1110    PT SHORT TERM GOAL #1   Title I with initial HEP (06/11/15)   Time 2   Period Weeks   Status Achieved   PT SHORT TERM GOAL #2   Title patient to obtain RW (06/11/15)   Time 2   Period Days   Status On-going           PT Long Term Goals - 06/07/15 1102    PT LONG TERM GOAL #1   Title I with advanced HEP   Time 6   Period Weeks   Status On-going   PT LONG TERM GOAL #2   Title Pt to amb safely in clinic with least restrictive AD   Period Weeks   Status On-going   PT LONG TERM GOAL #3   Title improved BERG score to 46/56 or better to help reduce falls   Time 6   Period Weeks   Status On-going   PT LONG TERM  GOAL #4   Title improve 5 times sit to stand to 20 seconds  22 seconds 06/07/15   Time 6   Period Weeks   Status On-going   PT LONG TERM GOAL #5   Title demo 4/5 or better BLE strength to improve function.   Time 6   Period Weeks   Status On-going   PT LONG TERM GOAL #6   Title decreased pain in back/hips to 4/10  or less with ADLs   Time 6   Period Weeks   Status On-going               Problem List Patient Active Problem List   Diagnosis Date Noted  . Osteoarthritis of lumbar spine 05/08/2015  . Spondylosis of lumbar region without myelopathy or radiculopathy 09/01/2014  . Diabetes ([place]) 04/26/2014  . Vitamin D deficiency 04/26/2014  . Chronic cystitis 04/26/2014  . Atrophic vaginitis 04/26/2014  . Solitary pulmonary nodule 03/20/2014  . Chest pain 03/18/2014  . Osteoporotic compression fracture of spine with routine healing 03/08/2014  . Precordial pain 02/16/2012  . PALPITATIONS 02/27/2010  . CAD 12/12/2009  . Aortic aneurysm of unspecified site without mention of rupture 06/27/2009  . DYSPNEA 06/27/2009  . DYSLIPIDEMIA 12/12/2008  . Essential hypertension 12/12/2008  . GERD 12/12/2008  . OSTEOARTHRITIS 12/12/2008   Treatment:  Nustep  x 20 minutes at level 5 f/b Rockerboard; Airex balance pad and Airex balance beam activites for balance training/neuro re-education x 18 minutes.  Excellent job today. Yale Golla, [place] MPT 06/26/2015, 5:06 PM  [facility] [address] [place], [place], [address] Phone: [phone]   Fax:  [phone]  Name: Debra Phillips MRN: [id number] Date of Birth: [birth year]

## 2015-06-27 ENCOUNTER — Encounter: Payer: Self-pay | Admitting: *Deleted

## 2015-06-29 ENCOUNTER — Ambulatory Visit: Payer: Medicare Other | Admitting: Physical Therapy

## 2015-06-29 DIAGNOSIS — M5441 Lumbago with sciatica, right side: Secondary | ICD-10-CM | POA: Diagnosis not present

## 2015-06-29 DIAGNOSIS — R2681 Unsteadiness on feet: Secondary | ICD-10-CM | POA: Diagnosis not present

## 2015-06-29 DIAGNOSIS — M5442 Lumbago with sciatica, left side: Secondary | ICD-10-CM | POA: Diagnosis not present

## 2015-06-29 DIAGNOSIS — R531 Weakness: Secondary | ICD-10-CM | POA: Diagnosis not present

## 2015-06-29 NOTE — Therapy (Signed)
[facility] [place], [place], [address] Phone: [phone]   Fax:  [phone]  Physical Therapy Treatment  Patient Details  Name: Debra Phillips MRN: [id number] Date of Birth: 06-01-1929 Referring Provider: Dr. Laurance Flatten  Encounter Date: 06/29/2015      PT End of Session - 06/29/15 1041    Visit Number 10   Number of Visits 12   Date for PT Re-Evaluation 07/09/15   PT Start Time 1037   PT Stop Time 1115   PT Time Calculation (min) 38 min   Activity Tolerance Patient tolerated treatment well   Behavior During Therapy [facility] for tasks assessed/performed      Past Medical History  Diagnosis Date  . Hypertension   . Esophageal stricture     s/p dilation  . Osteoarthritis   . GERD (gastroesophageal reflux disease)   . Breast cyst     Right  . Dyslipidemia   . Aortic root dilatation (HCC)     Mild  . Diabetes mellitus without complication ([place])   . Hyperlipidemia   . Osteoporosis   . Cataract   . Macular pucker, left eye     Past Surgical History  Procedure Laterality Date  . Partial hysterectomy    . Total knee arthroplasty      Left  . Shoulder surgery      Right  . Knee arthroscopy  2004    Left and right  . Lumbar fusion  2000,2003  . Radial head arthroplasty  5/12    orif rt radial head  . Back surgery    . Abdominal hysterectomy    . Appendectomy    . Cholecystectomy  1993  . Cardiac catheterization  2010    both cataracts  . Fracture surgery      rt wrist2010  . Kyphosis surgery    . Hardware removal  12/10/2011    Procedure: HARDWARE REMOVAL;  Surgeon: Schuyler Amor, MD;  Location: [facility];  Service: Orthopedics;  Laterality: Right;  radial head  . Eye surgery      cataracts  . Eye surgery      for macular pucker    There were no vitals filed for this visit.  Visit Diagnosis:  Unsteadiness  Weakness generalized      Subjective Assessment - 06/29/15 1109    Subjective Patient states she thinks she is stronger and doing better.    Patient Stated Goals to reduce falling, to keep moving   Currently in Pain? Yes   Pain Score 4    Pain Orientation Right;Left;Lower   Pain Descriptors / Indicators Sharp;Aching   Pain Type Chronic pain   Pain Onset More than a month ago   Pain Frequency Intermittent   Aggravating Factors  sitting   Pain Relieving Factors moving   Effect of Pain on Daily Activities can't do as much            [facility] PT Assessment - 06/29/15 0001    Assessment   Medical Diagnosis Gait instability; OA lumbar with radiculopathy   Precautions   Precautions Fall   ROM / Strength   AROM / PROM / Strength Strength   Strength   Strength Assessment Site Knee;Hip   Right/Left Hip Right;Left   Right Hip Flexion 4+/5   Left Hip Flexion 4-/5   Right Knee Flexion 5/5   Right Knee Extension 4+/5   Left Knee Flexion 4+/5   Left Knee Extension 4/5  Standardized Balance Assessment   Five times sit to stand comments  17 seconds   Berg Balance Test   Sit to Stand Able to stand without using hands and stabilize independently   Standing Unsupported Able to stand safely 2 minutes   Sitting with Back Unsupported but Feet Supported on Floor or Stool Able to sit safely and securely 2 minutes   Stand to Sit Sits safely with minimal use of hands   Transfers Able to transfer safely, minor use of hands   Standing Unsupported with Eyes Closed Able to stand 10 seconds safely   Standing Ubsupported with Feet Together Able to place feet together independently and stand 1 minute safely   From Standing, Reach Forward with Outstretched Arm Can reach confidently >25 cm (10")   From Standing Position, Pick up Object from Floor Able to pick up shoe safely and easily   From Standing Position, Turn to Look Behind Over each Shoulder Looks behind from both sides and weight shifts well   Turn 360 Degrees Able to turn 360 degrees safely but slowly   Standing  Unsupported, Alternately Place Feet on Step/Stool Able to stand independently and complete 8 steps >20 seconds   Standing Unsupported, One Foot in Front Able to take small step independently and hold 30 seconds   Standing on One Leg Tries to lift leg/unable to hold 3 seconds but remains standing independently   Total Score 48                     [facility] Adult PT Treatment/Exercise - 06/29/15 0001    Neuro Re-ed    Neuro Re-ed Details  Retest of BERG   Lumbar Exercises: Aerobic   Stationary Bike NuStep L4 x10 min   Knee/Hip Exercises: Seated   Other Seated Knee/Hip Exercises marching 1x10, 1x10 with wt   Marching Weights 2 lbs.   Sit to [employer] 5 reps                  PT Short Term Goals - 06/29/15 1120    PT SHORT TERM GOAL #2   Title patient to obtain RW (06/11/15)   Time 2   Status Achieved           PT Long Term Goals - 06/29/15 1120    PT LONG TERM GOAL #1   Title I with advanced HEP   Time 6   Period Weeks   Status On-going   PT LONG TERM GOAL #2   Title Pt to amb safely in clinic with least restrictive AD   Time 6   Period Weeks   Status On-going   PT LONG TERM GOAL #3   Title improved BERG score to 46/56 or better to help reduce falls  48/56   Time 6   Period Weeks   Status Achieved   PT LONG TERM GOAL #4   Title improve 5 times sit to stand to 20 seconds  17 sec   Time 6   Period Weeks   Status Achieved   PT LONG TERM GOAL #5   Title demo 4/5 or better BLE strength to improve function.   Time 6   Status On-going   PT LONG TERM GOAL #6   Title decreased pain in back/hips to 4/10  or less with ADLs   Time 6   Period Weeks   Status On-going               Plan - 06/29/15  1124    Clinical Impression Statement Patient has made significant improvements with balance, improving her BERG score by 10 points to 48/56.Marland Kitchen She is able to amb safely in the clinic with a SPC. She still has some BLE weakness mainly with L hiip.  Functionally, she shows weakness with SLS on the L with steps for example..   Pt will benefit from skilled therapeutic intervention in order to improve on the following deficits Decreased range of motion;Difficulty walking;Decreased safety awareness;Pain;Decreased activity tolerance;Decreased balance;Decreased strength;Postural dysfunction   Rehab Potential Good   PT Frequency 2x / week   PT Duration 6 weeks   PT Treatment/Interventions ADLs/Self Care Home Management;Cryotherapy;Electrical Stimulation;Moist Heat;Therapeutic exercise;Gait training;Stair training;Ultrasound;Balance training;Neuromuscular re-education;Patient/family education;Manual techniques;Passive range of motion   PT Next Visit Plan Hip flexion strengthening, standing hip ABD is tolerated. 2 more visits then d/c to HEP.    Consulted and Agree with Plan of Care Patient          G-Codes - 20-Jul-2015 1122    Functional Assessment Tool Used BERG Balance 48/56   Functional Limitation Mobility: Walking and moving around   Mobility: Walking and Moving Around Current Status [phone] At least 20 percent but less than 40 percent impaired, limited or restricted   Mobility: Walking and Moving Around Goal Status [phone]) At least 40 percent but less than 60 percent impaired, limited or restricted      Problem List Patient Active Problem List   Diagnosis Date Noted  . Osteoarthritis of lumbar spine 05/08/2015  . Spondylosis of lumbar region without myelopathy or radiculopathy 09/01/2014  . Diabetes ([place]) 04/26/2014  . Vitamin D deficiency 04/26/2014  . Chronic cystitis 04/26/2014  . Atrophic vaginitis 04/26/2014  . Solitary pulmonary nodule 03/20/2014  . Chest pain 03/18/2014  . Osteoporotic compression fracture of spine with routine healing 03/08/2014  . Precordial pain 02/16/2012  . PALPITATIONS 02/27/2010  . CAD 12/12/2009  . Aortic aneurysm of unspecified site without mention of rupture 06/27/2009  . DYSPNEA 06/27/2009  .  DYSLIPIDEMIA 12/12/2008  . Essential hypertension 12/12/2008  . GERD 12/12/2008  . OSTEOARTHRITIS 12/12/2008    Madelyn Flavors PT  07-20-2015, 11:29 AM  [facility] [address] [place], [place], [address] Phone: [phone]   Fax:  [phone]  Name: Debra Phillips MRN: [id number] Date of Birth: 03-12-1929

## 2015-07-04 ENCOUNTER — Ambulatory Visit: Payer: Medicare Other | Admitting: Physical Therapy

## 2015-07-04 DIAGNOSIS — R2681 Unsteadiness on feet: Secondary | ICD-10-CM | POA: Diagnosis not present

## 2015-07-04 DIAGNOSIS — R531 Weakness: Secondary | ICD-10-CM

## 2015-07-04 DIAGNOSIS — M5441 Lumbago with sciatica, right side: Secondary | ICD-10-CM | POA: Diagnosis not present

## 2015-07-04 DIAGNOSIS — M5442 Lumbago with sciatica, left side: Secondary | ICD-10-CM | POA: Diagnosis not present

## 2015-07-04 NOTE — Therapy (Signed)
[facility] [place], [place], [address] Phone: [phone]   Fax:  [phone]  Physical Therapy Treatment  Patient Details  Name: Debra Phillips MRN: [id number] Date of Birth: 1928-12-08 Referring Provider: Dr. Laurance Flatten  Encounter Date: 07/04/2015      PT End of Session - 07/04/15 1039    Visit Number 11   Number of Visits 12   Date for PT Re-Evaluation 07/09/15   PT Start Time 1038   PT Stop Time 1117   PT Time Calculation (min) 39 min   Activity Tolerance Patient tolerated treatment well   Behavior During Therapy [facility] for tasks assessed/performed      Past Medical History  Diagnosis Date  . Hypertension   . Esophageal stricture     s/p dilation  . Osteoarthritis   . GERD (gastroesophageal reflux disease)   . Breast cyst     Right  . Dyslipidemia   . Aortic root dilatation (HCC)     Mild  . Diabetes mellitus without complication ([place])   . Hyperlipidemia   . Osteoporosis   . Cataract   . Macular pucker, left eye     Past Surgical History  Procedure Laterality Date  . Partial hysterectomy    . Total knee arthroplasty      Left  . Shoulder surgery      Right  . Knee arthroscopy  2004    Left and right  . Lumbar fusion  2000,2003  . Radial head arthroplasty  5/12    orif rt radial head  . Back surgery    . Abdominal hysterectomy    . Appendectomy    . Cholecystectomy  1993  . Cardiac catheterization  2010    both cataracts  . Fracture surgery      rt wrist2010  . Kyphosis surgery    . Hardware removal  12/10/2011    Procedure: HARDWARE REMOVAL;  Surgeon: Schuyler Amor, MD;  Location: [facility];  Service: Orthopedics;  Laterality: Right;  radial head  . Eye surgery      cataracts  . Eye surgery      for macular pucker    There were no vitals filed for this visit.  Visit Diagnosis:  Unsteadiness  Weakness generalized      Subjective Assessment - 07/04/15 1039    Subjective Yesterday I caught a pain in my back and I could hardly move. Today it's better.   Patient Stated Goals to reduce falling, to keep moving   Currently in Pain? Yes   Pain Score 6    Pain Location Back   Pain Orientation Right;Left   Pain Descriptors / Indicators Sharp;Aching   Pain Type Chronic pain                         [facility] Adult PT Treatment/Exercise - 07/04/15 0001    Lumbar Exercises: Aerobic   Stationary Bike NuStep L5 x10 min   Lumbar Exercises: Seated   Sit to Stand 20 reps   Knee/Hip Exercises: Standing   Hip Abduction Stengthening;Both;2 sets;10 reps   Knee/Hip Exercises: Seated   Other Seated Knee/Hip Exercises marching 3x10 with wt   Marching Weights 2 lbs.   Sit to [employer] 20 reps                  PT Short Term Goals - 06/29/15 1120    PT SHORT TERM GOAL #2  Title patient to obtain RW (06/11/15)   Time 2   Status Achieved           PT Long Term Goals - 06/29/15 1120    PT LONG TERM GOAL #1   Title I with advanced HEP   Time 6   Period Weeks   Status On-going   PT LONG TERM GOAL #2   Title Pt to amb safely in clinic with least restrictive AD   Time 6   Period Weeks   Status On-going   PT LONG TERM GOAL #3   Title improved BERG score to 46/56 or better to help reduce falls  48/56   Time 6   Period Weeks   Status Achieved   PT LONG TERM GOAL #4   Title improve 5 times sit to stand to 20 seconds  17 sec   Time 6   Period Weeks   Status Achieved   PT LONG TERM GOAL #5   Title demo 4/5 or better BLE strength to improve function.   Time 6   Status On-going   PT LONG TERM GOAL #6   Title decreased pain in back/hips to 4/10  or less with ADLs   Time 6   Period Weeks   Status On-going               Plan - 07/04/15 1211    Clinical Impression Statement Patient had increased pain in LB today but was able to complete therex. She has most difficulty with standing hip ABD.    PT Next Visit Plan Assess  goals. D/C patient to HEP. Hip flexion strengthening, standing hip ABD is tolerated.         Problem List Patient Active Problem List   Diagnosis Date Noted  . Osteoarthritis of lumbar spine 05/08/2015  . Spondylosis of lumbar region without myelopathy or radiculopathy 09/01/2014  . Diabetes ([place]) 04/26/2014  . Vitamin D deficiency 04/26/2014  . Chronic cystitis 04/26/2014  . Atrophic vaginitis 04/26/2014  . Solitary pulmonary nodule 03/20/2014  . Chest pain 03/18/2014  . Osteoporotic compression fracture of spine with routine healing 03/08/2014  . Precordial pain 02/16/2012  . PALPITATIONS 02/27/2010  . CAD 12/12/2009  . Aortic aneurysm of unspecified site without mention of rupture 06/27/2009  . DYSPNEA 06/27/2009  . DYSLIPIDEMIA 12/12/2008  . Essential hypertension 12/12/2008  . GERD 12/12/2008  . OSTEOARTHRITIS 12/12/2008    Madelyn Flavors PT  07/04/2015, 12:13 PM  [facility] [address] [place], [place], [address] Phone: [phone]   Fax:  [phone]  Name: DENELDA AKERLEY MRN: [id number] Date of Birth: 05/10/1929

## 2015-07-05 ENCOUNTER — Encounter: Payer: Self-pay | Admitting: Physical Therapy

## 2015-07-05 ENCOUNTER — Ambulatory Visit: Payer: Medicare Other | Admitting: Physical Therapy

## 2015-07-05 DIAGNOSIS — M5441 Lumbago with sciatica, right side: Secondary | ICD-10-CM

## 2015-07-05 DIAGNOSIS — M5442 Lumbago with sciatica, left side: Secondary | ICD-10-CM

## 2015-07-05 DIAGNOSIS — R2681 Unsteadiness on feet: Secondary | ICD-10-CM

## 2015-07-05 DIAGNOSIS — R531 Weakness: Secondary | ICD-10-CM

## 2015-07-05 NOTE — Patient Instructions (Signed)
Sitting to Standing    With straight back, tighten stomach, place right leg back under chair, lean slightly forward and stand. Repeat __10__ times per set. Do __2__ sets per session. Do _2___ sessions per day.  http://orth.exer.us/1140   Copyright  VHI. All rights reserved.    Marching-  While sitting in chair or standing at [employer]. Raise your knees up in the air like you are marching. Complete 20 times twice a day.

## 2015-07-05 NOTE — Therapy (Addendum)
[facility] [place], [place], [address] Phone: [phone]   Fax:  [phone]  Physical Therapy Treatment  Patient Details  Name: Debra Phillips MRN: [id number] Date of Birth: 25-Nov-1928 Referring Provider: Dr. Laurance Flatten  Encounter Date: 07/05/2015      PT End of Session - 07/05/15 1535    Visit Number 12   Number of Visits 12   Date for PT Re-Evaluation 07/09/15   PT Start Time 1440   PT Stop Time 1521   PT Time Calculation (min) 41 min   Activity Tolerance Patient tolerated treatment well   Behavior During Therapy [facility] for tasks assessed/performed      Past Medical History  Diagnosis Date  . Hypertension   . Esophageal stricture     s/p dilation  . Osteoarthritis   . GERD (gastroesophageal reflux disease)   . Breast cyst     Right  . Dyslipidemia   . Aortic root dilatation (HCC)     Mild  . Diabetes mellitus without complication ([place])   . Hyperlipidemia   . Osteoporosis   . Cataract   . Macular pucker, left eye     Past Surgical History  Procedure Laterality Date  . Partial hysterectomy    . Total knee arthroplasty      Left  . Shoulder surgery      Right  . Knee arthroscopy  2004    Left and right  . Lumbar fusion  2000,2003  . Radial head arthroplasty  5/12    orif rt radial head  . Back surgery    . Abdominal hysterectomy    . Appendectomy    . Cholecystectomy  1993  . Cardiac catheterization  2010    both cataracts  . Fracture surgery      rt wrist2010  . Kyphosis surgery    . Hardware removal  12/10/2011    Procedure: HARDWARE REMOVAL;  Surgeon: Schuyler Amor, MD;  Location: [facility];  Service: Orthopedics;  Laterality: Right;  radial head  . Eye surgery      cataracts  . Eye surgery      for macular pucker    There were no vitals filed for this visit.  Visit Diagnosis:  Unsteadiness  Weakness generalized  Bilateral low back pain with left-sided  sciatica  Bilateral low back pain with right-sided sciatica      Subjective Assessment - 07/05/15 1510    Subjective Reports that her back is bothering her today and that it has its good days and bad days. Reports that she is "give out."   Patient Stated Goals to reduce falling, to keep moving   Currently in Pain? Yes   Pain Score 6    Pain Location Back   Pain Orientation Right;Left   Pain Type Chronic pain   Pain Onset More than a month ago   Pain Frequency Intermittent            [facility] PT Assessment - 07/05/15 0001    Assessment   Medical Diagnosis Gait instability; OA lumbar with radiculopathy   Next MD Visit 4 months   Precautions   Precautions Fall   ROM / Strength   AROM / PROM / Strength Strength   Strength   Strength Assessment Site Hip;Knee   Right/Left Hip Right;Left   Right Hip Flexion 3+/5   Left Hip Flexion 4-/5   Right Knee Flexion 4/5   Right Knee Extension 5/5   Left  Knee Flexion 4+/5   Left Knee Extension 5/5                     [facility] Adult PT Treatment/Exercise - 07/05/15 0001    Ambulation/Gait   Ambulation/Gait Yes   Ambulation/Gait Assistance 6: Modified independent (Device/Increase time)   Ambulation Distance (Feet) 100 Feet   Assistive device Straight cane   Gait Pattern Step-through pattern;Decreased step length - right;Decreased step length - left;Decreased stride length;Trunk flexed;Narrow base of support   Ambulation Surface Level;Indoor   Lumbar Exercises: Aerobic   Stationary Bike NuStep L5 x14 min   Lumbar Exercises: Seated   Long Arc Quad on Chair Strengthening;Both;2 sets;10 reps;Weights   LAQ on Chair Weights (lbs) 3   Hip Flexion on Ball Strengthening;Both;20 reps   Hip Flexion on Ball Limitations 3#   Sit to Stand 15 reps;Other (comment)  No UE support   Knee/Hip Exercises: Standing   Hip Abduction Stengthening;Both;1 set;15 reps                PT Education - 07/05/15 1521    Education provided  Yes   Education Details HEP- marching, sit to stands   Person(s) Educated Patient   Methods Explanation;Verbal cues;Handout   Comprehension Verbalized understanding;Verbal cues required          PT Short Term Goals - 07/05/15 1512    PT SHORT TERM GOAL #1   Title I with initial HEP (06/11/15)   Time 2   Period Weeks   Status Achieved   PT SHORT TERM GOAL #2   Title patient to obtain RW (06/11/15)   Time 2   Status Achieved           PT Long Term Goals - 07/05/15 1513    PT LONG TERM GOAL #1   Title I with advanced HEP   Time 6   Period Weeks   Status Achieved  Given 07/05/2015   PT LONG TERM GOAL #2   Title Pt to amb safely in clinic with least restrictive AD   Time 6   Period Weeks   Status Achieved   PT LONG TERM GOAL #3   Title improved BERG score to 46/56 or better to help reduce falls  48/56   Time 6   Period Weeks   Status Achieved   PT LONG TERM GOAL #4   Title improve 5 times sit to stand to 20 seconds  17 sec   Time 6   Period Weeks   Status Achieved   PT LONG TERM GOAL #5   Title demo 4/5 or better BLE strength to improve function.   Time 6   Status Partially Met   PT LONG TERM GOAL #6   Title decreased pain in back/hips to 4/10  or less with ADLs   Time 6   Period Weeks   Status Not Met  Reports increased pain with ADLs and activiites               Plan - 07/05/15 1533    Clinical Impression Statement Patient has progressed slightly with therapy but back pain hinders patient intermittantly thus affecting LE strength as it did today. Ambulated safely in clinic for 100 ft with [facility] although she ambulated slowly with trunk flexion due to back pain. Accepted HEP for home without any questions. Achieved all ST goals and most of the LT goals except LE strength goal in which she partially met and the back pain with  ADLs goal in which she did not meet secondary to increased pain with ADLs.    Pt will benefit from skilled therapeutic  intervention in order to improve on the following deficits Decreased range of motion;Difficulty walking;Decreased safety awareness;Pain;Decreased activity tolerance;Decreased balance;Decreased strength;Postural dysfunction   Rehab Potential Good   PT Frequency 2x / week   PT Duration 6 weeks   PT Treatment/Interventions ADLs/Self Care Home Management;Cryotherapy;Electrical Stimulation;Moist Heat;Therapeutic exercise;Gait training;Stair training;Ultrasound;Balance training;Neuromuscular re-education;Patient/family education;Manual techniques;Passive range of motion   PT Next Visit Plan Communicate need for D/C summary to MPT.   Consulted and Agree with Plan of Care Patient        Problem List Patient Active Problem List   Diagnosis Date Noted  . Osteoarthritis of lumbar spine 05/08/2015  . Spondylosis of lumbar region without myelopathy or radiculopathy 09/01/2014  . Diabetes ([place]) 04/26/2014  . Vitamin D deficiency 04/26/2014  . Chronic cystitis 04/26/2014  . Atrophic vaginitis 04/26/2014  . Solitary pulmonary nodule 03/20/2014  . Chest pain 03/18/2014  . Osteoporotic compression fracture of spine with routine healing 03/08/2014  . Precordial pain 02/16/2012  . PALPITATIONS 02/27/2010  . CAD 12/12/2009  . Aortic aneurysm of unspecified site without mention of rupture 06/27/2009  . DYSPNEA 06/27/2009  . DYSLIPIDEMIA 12/12/2008  . Essential hypertension 12/12/2008  . GERD 12/12/2008  . OSTEOARTHRITIS 12/12/2008    Ahmed Prima, PTA 07/05/2015 3:42 PM  [facility] [address] [place], [place], [address] Phone: [phone]   Fax:  [phone]  Name: Debra Phillips MRN: [id number] Date of Birth: September 04, 1928    PHYSICAL THERAPY DISCHARGE SUMMARY  Visits from Start of Care:12  Current functional level related to goals / functional outcomes: See above   Remaining deficits: See above   Education / Equipment: HEP   Plan: Patient agrees to discharge.  Patient goals were partially met. Patient is being discharged due to not returning since the last visit.  ?????        Madelyn Flavors, PT 07/09/2015 1:04 PM [facility] [address] [place], [place], [address] Phone: [phone]   Fax:  [phone]

## 2015-07-09 ENCOUNTER — Other Ambulatory Visit: Payer: Self-pay | Admitting: Family Medicine

## 2015-07-10 ENCOUNTER — Other Ambulatory Visit: Payer: Self-pay | Admitting: *Deleted

## 2015-07-10 MED ORDER — DILTIAZEM HCL ER COATED BEADS 180 MG PO CP24: 180.0000 mg | ORAL_CAPSULE | Freq: Every day | ORAL | Status: DC

## 2015-07-13 DIAGNOSIS — E119 Type 2 diabetes mellitus without complications: Secondary | ICD-10-CM | POA: Diagnosis not present

## 2015-07-13 DIAGNOSIS — H0288A Meibomian gland dysfunction right eye, upper and lower eyelids: Secondary | ICD-10-CM | POA: Insufficient documentation

## 2015-07-13 DIAGNOSIS — H3530 Unspecified macular degeneration: Secondary | ICD-10-CM | POA: Diagnosis not present

## 2015-07-13 DIAGNOSIS — H01002 Unspecified blepharitis right lower eyelid: Secondary | ICD-10-CM | POA: Diagnosis not present

## 2015-07-13 DIAGNOSIS — H01005 Unspecified blepharitis left lower eyelid: Secondary | ICD-10-CM | POA: Diagnosis not present

## 2015-07-13 DIAGNOSIS — H16223 Keratoconjunctivitis sicca, not specified as Sjogren's, bilateral: Secondary | ICD-10-CM | POA: Insufficient documentation

## 2015-07-13 DIAGNOSIS — Z961 Presence of intraocular lens: Secondary | ICD-10-CM | POA: Diagnosis not present

## 2015-07-13 DIAGNOSIS — H0289 Other specified disorders of eyelid: Secondary | ICD-10-CM | POA: Diagnosis not present

## 2015-07-13 DIAGNOSIS — M3501 Sicca syndrome with keratoconjunctivitis: Secondary | ICD-10-CM | POA: Insufficient documentation

## 2015-07-13 DIAGNOSIS — H0288B Meibomian gland dysfunction left eye, upper and lower eyelids: Secondary | ICD-10-CM

## 2015-07-13 DIAGNOSIS — H01004 Unspecified blepharitis left upper eyelid: Secondary | ICD-10-CM | POA: Diagnosis not present

## 2015-07-13 DIAGNOSIS — H02889 Meibomian gland dysfunction of unspecified eye, unspecified eyelid: Secondary | ICD-10-CM | POA: Insufficient documentation

## 2015-07-13 LAB — HM DIABETES EYE EXAM

## 2015-07-18 ENCOUNTER — Encounter: Payer: Self-pay | Admitting: *Deleted

## 2015-08-06 DIAGNOSIS — H353133 Nonexudative age-related macular degeneration, bilateral, advanced atrophic without subfoveal involvement: Secondary | ICD-10-CM | POA: Diagnosis not present

## 2015-08-06 DIAGNOSIS — H35363 Drusen (degenerative) of macula, bilateral: Secondary | ICD-10-CM | POA: Diagnosis not present

## 2015-08-13 ENCOUNTER — Encounter: Payer: Self-pay | Admitting: Family Medicine

## 2015-08-13 ENCOUNTER — Ambulatory Visit (INDEPENDENT_AMBULATORY_CARE_PROVIDER_SITE_OTHER): Payer: Medicare Other | Admitting: Family Medicine

## 2015-08-13 ENCOUNTER — Ambulatory Visit (INDEPENDENT_AMBULATORY_CARE_PROVIDER_SITE_OTHER): Payer: Medicare Other

## 2015-08-13 VITALS — BP 113/71 | HR 71 | Temp 97.1°F | Ht 62.0 in | Wt 173.0 lb

## 2015-08-13 DIAGNOSIS — M25551 Pain in right hip: Secondary | ICD-10-CM

## 2015-08-13 DIAGNOSIS — M47816 Spondylosis without myelopathy or radiculopathy, lumbar region: Secondary | ICD-10-CM

## 2015-08-13 NOTE — Progress Notes (Signed)
Subjective:    Patient ID: Debra Phillips, female    DOB: [birth year], 81 y.o.   MRN: [id number]  HPI Patient is here today with right hip pain. She states that the pain is increasing and she is unable to lay on in at night. She states that it is also becoming difficult for her to put weight on that hip and also becoming difficult to walk. She denies any injury. She has had a lot of aches and pains for a long time but her hip has become more painful recently.    Review of Systems  Constitutional: Negative.   HENT: Negative.   Eyes: Negative.   Respiratory: Negative.   Cardiovascular: Negative.   Gastrointestinal: Negative.   Endocrine: Negative.   Genitourinary: Negative.   Musculoskeletal:       Right Hip Pain   Skin: Negative.   Allergic/Immunologic: Negative.   Neurological: Negative.   Hematological: Negative.   Psychiatric/Behavioral: Negative.           Patient Active Problem List   Diagnosis Date Noted  . Osteoarthritis of lumbar spine 05/08/2015  . Spondylosis of lumbar region without myelopathy or radiculopathy 09/01/2014  . Diabetes ([place]) 04/26/2014  . Vitamin D deficiency 04/26/2014  . Chronic cystitis 04/26/2014  . Atrophic vaginitis 04/26/2014  . Solitary pulmonary nodule 03/20/2014  . Chest pain 03/18/2014  . Osteoporotic compression fracture of spine with routine healing 03/08/2014  . Precordial pain 02/16/2012  . PALPITATIONS 02/27/2010  . CAD 12/12/2009  . Aortic aneurysm of unspecified site without mention of rupture 06/27/2009  . DYSPNEA 06/27/2009  . DYSLIPIDEMIA 12/12/2008  . Essential hypertension 12/12/2008  . GERD 12/12/2008  . OSTEOARTHRITIS 12/12/2008   Outpatient Encounter Prescriptions as of 08/13/2015  Medication Sig  . aspirin 81 MG tablet Take 81 mg by mouth every evening.   . Calcium Carb-Cholecalciferol 600-500 MG-UNIT CAPS Take 1 capsule by mouth every morning.  . Cholecalciferol 1000 UNITS tablet Take 1,000 Units by mouth  daily.   . Cinnamon 500 MG capsule Take 500 mg by mouth daily.   Marland Kitchen diltiazem (CARDIZEM CD) 180 MG 24 hr capsule Take 1 capsule (180 mg total) by mouth daily.  Marland Kitchen ESTRING 2 MG vaginal ring   . Flaxseed, Linseed, (FLAX SEED OIL PO) Take 1 tablet by mouth daily.  . fluticasone (FLONASE) 50 MCG/ACT nasal spray Place 2 sprays into both nostrils daily.  . furosemide (LASIX) 20 MG tablet Take 40 mg by mouth daily as needed for fluid or edema.  Marland Kitchen lisinopril-hydrochlorothiazide (PRINZIDE,ZESTORETIC) 20-12.5 MG tablet TAKE ONE TABLET BY MOUTH ONE TIME DAILY  . metFORMIN (GLUCOPHAGE) 500 MG tablet TAKE ONE TABLET BY MOUTH TWICE DAILY  . multivitamin (THERAGRAN) per tablet Take 1 tablet by mouth daily.    . Omega-3 Fatty Acids (FISH OIL) 1000 MG CAPS Take 2,000 mg by mouth at bedtime.  Marland Kitchen omeprazole (PRILOSEC) 40 MG capsule Take 40 mg by mouth 2 (two) times daily.  . ONE TOUCH ULTRA TEST test strip USE TO CHECK BLOOD GLUCOSE ONCE DAILY  . ONETOUCH DELICA LANCETS 99991111 MISC USE TO CHECK BLOOD SUGAR ONCE DAILY  . Probiotic Product (PROBIOTIC DAILY PO) Take 1 capsule by mouth daily.  . traMADol (ULTRAM) 50 MG tablet Take 50 mg by mouth at bedtime as needed for moderate pain.   Marland Kitchen trimethoprim (TRIMPEX) 100 MG tablet Take 100 mg by mouth at bedtime.  . Zoledronic Acid (RECLAST IV) Inject into the vein. Administered by Dr Carloyn Manner at [facility]  hospital   No facility-administered encounter medications on file as of 08/13/2015.       Objective:   Physical Exam  Constitutional: She appears well-developed and well-nourished.  Musculoskeletal: She exhibits tenderness. She exhibits no edema.  The patient has trouble walking because of right hip pain. The tenderness is in the lateral right hip. There is no rash. There is no back tenderness. Leg raising on the right side is limited to about 45 and there is no problem with leg raising on the left side.  Neurological: She is alert.  Skin: Skin is warm and dry. No rash  noted.  Psychiatric: She has a normal mood and affect. Her behavior is normal. Judgment and thought content normal.  Nursing note and vitals reviewed.   BP 113/71 mmHg  Pulse 71  Temp(Src) 97.1 F (36.2 C) (Oral)  Ht 5\' 2"  (1.575 m)  Wt 173 lb (78.472 kg)  BMI 31.63 kg/m2  1 mL of Marcaine and 40 mg of Kenalog were injected into the area of point tenderness to the right hip without problems after sterile cleansing and a pressure dressing was applied, the patient tolerated the procedure well.     Assessment & Plan:  1. Right hip pain -Use warm wet compresses to the right hip 20 minutes 3 or 4 times daily -40 mg of Kenalog and 1 mL of Marcaine injected into the hip without problems or complications -Patient will call in a couple days to let us be aware of her progress - DG HIP UNILAT W OR W/O PELVIS 2-3 VIEWS RIGHT; Future  2. Spondylosis of lumbar region without myelopathy or radiculopathy -The patient has a history of spondylosis of her spine and I do not think that this was causing the problem in this case of active problems to come from the hip.  Patient Instructions  Use warm wet compresses to the affected hip Take tramadol if needed for severe pain Take extra strength Tylenol every 4 hours if needed for more moderate pain We will call you with the x-ray results once they become available   Arrie Senate MD

## 2015-08-13 NOTE — Patient Instructions (Addendum)
Use warm wet compresses to the affected hip Take tramadol if needed for severe pain Take extra strength Tylenol every 4 hours if needed for more moderate pain We will call you with the x-ray results once they become available

## 2015-08-15 ENCOUNTER — Telehealth: Payer: Self-pay | Admitting: Family Medicine

## 2015-08-15 NOTE — Telephone Encounter (Signed)
FYI Shot is helping with hip pain

## 2015-08-30 ENCOUNTER — Encounter: Payer: Self-pay | Admitting: Family Medicine

## 2015-08-30 ENCOUNTER — Ambulatory Visit (INDEPENDENT_AMBULATORY_CARE_PROVIDER_SITE_OTHER): Payer: Medicare Other | Admitting: Family Medicine

## 2015-08-30 VITALS — BP 124/76 | HR 69 | Temp 97.0°F | Ht 62.0 in | Wt 172.0 lb

## 2015-08-30 DIAGNOSIS — E871 Hypo-osmolality and hyponatremia: Secondary | ICD-10-CM | POA: Diagnosis not present

## 2015-08-30 DIAGNOSIS — K219 Gastro-esophageal reflux disease without esophagitis: Secondary | ICD-10-CM | POA: Diagnosis not present

## 2015-08-30 DIAGNOSIS — R39198 Other difficulties with micturition: Secondary | ICD-10-CM

## 2015-08-30 DIAGNOSIS — E119 Type 2 diabetes mellitus without complications: Secondary | ICD-10-CM

## 2015-08-30 DIAGNOSIS — E559 Vitamin D deficiency, unspecified: Secondary | ICD-10-CM | POA: Diagnosis not present

## 2015-08-30 DIAGNOSIS — I10 Essential (primary) hypertension: Secondary | ICD-10-CM

## 2015-08-30 DIAGNOSIS — R5383 Other fatigue: Secondary | ICD-10-CM

## 2015-08-30 LAB — POCT UA - MICROSCOPIC ONLY
Bacteria, U Microscopic: NEGATIVE
CASTS, UR, LPF, POC: NEGATIVE
Crystals, Ur, HPF, POC: NEGATIVE
MUCUS UA: NEGATIVE
RBC, urine, microscopic: NEGATIVE
WBC, UR, HPF, POC: NEGATIVE
YEAST UA: NEGATIVE

## 2015-08-30 LAB — POCT URINALYSIS DIPSTICK
BILIRUBIN UA: NEGATIVE
Glucose, UA: NEGATIVE
Ketones, UA: NEGATIVE
LEUKOCYTES UA: NEGATIVE
NITRITE UA: NEGATIVE
PH UA: 7
PROTEIN UA: NEGATIVE
RBC UA: NEGATIVE
Spec Grav, UA: 1.01
Urobilinogen, UA: NEGATIVE

## 2015-08-30 LAB — POCT GLYCOSYLATED HEMOGLOBIN (HGB A1C): Hemoglobin A1C: 7

## 2015-08-30 NOTE — Patient Instructions (Addendum)
Medicare Annual Wellness Visit  [facility] and the medical providers at [facility] strive to bring you the best medical care.  In doing so we not only want to address your current medical conditions and concerns but also to detect new conditions early and prevent illness, disease and health-related problems.    Medicare offers a yearly Wellness Visit which allows our clinical staff to assess your need for preventative services including immunizations, lifestyle education, counseling to decrease risk of preventable diseases and screening for fall risk and other medical concerns.    This visit is provided free of charge (no copay) for all Medicare recipients. The clinical pharmacists at [facility] have begun to conduct these Wellness Visits which will also include a thorough review of all your medications.    As you primary medical provider recommend that you make an appointment for your Annual Wellness Visit if you have not done so already this year.  You may set up this appointment before you leave today or you may call back [id number]) and schedule an appointment.  Please make sure when you call that you mention that you are scheduling your Annual Wellness Visit with the clinical pharmacist so that the appointment may be made for the proper length of time.     Continue current medications. Continue good therapeutic lifestyle changes which include good diet and exercise. Fall precautions discussed with patient. If an FOBT was given today- please return it to our front desk. If you are over 41 years old - you may need Prevnar 24 or the adult Pneumonia vaccine.  **Flu shots are available--- please call and schedule a FLU-CLINIC appointment**  After your visit with [place] today you will receive a survey in the mail or online from [employer] regarding your care with [place]. Please take a moment to fill this out. Your feedback is very  important to [place] as you can help [place] better understand your patient needs as well as improve your experience and satisfaction. WE CARE ABOUT YOU!!!   The patient should continue to work on weight with diet and exercise  Keep appointment with urology please discuss with him the discomfort you're having with voiding and the vaginal irritation Continue to monitor blood sugars at home and watch diet as closely as possible Keep appointment with cardiology

## 2015-08-30 NOTE — Progress Notes (Signed)
Subjective:    Patient ID: Debra Phillips, female    DOB: 06-30-1929, 82 y.o.   MRN: [id number]  HPI Pt here for follow up and management of chronic medical problems which includes hypertension and diabetes. She is taking medications regularly. The patient today complains of generalized fatigue and dysuria and vaginal irritation. She is also due to get lab work today. The patient is followed regularly by urology and has seen the cardiologist in the past . He denies chest pain or shortness of breath. She continues to have back pain and fatigue and a lot of discomfort in her vaginal area and with voiding. She has an estrogen ring that was given to her by the urologist and she goes to see him to have this changed every 2-3 months and is you a visit with him soon. She does have occasional trouble with swallowing and this seems to be getting a little bit worse and understand that she may need to have another endoscopy with dilatation if this gets worse. She denies any significant heartburn or indigestion at this time and has not been passing any blood per rectum or had any black tarry bowel movements.     Patient Active Problem List   Diagnosis Date Noted  . Osteoarthritis of lumbar spine 05/08/2015  . Spondylosis of lumbar region without myelopathy or radiculopathy 09/01/2014  . Diabetes ([place]) 04/26/2014  . Vitamin D deficiency 04/26/2014  . Chronic cystitis 04/26/2014  . Atrophic vaginitis 04/26/2014  . Solitary pulmonary nodule 03/20/2014  . Chest pain 03/18/2014  . Osteoporotic compression fracture of spine with routine healing 03/08/2014  . Precordial pain 02/16/2012  . PALPITATIONS 02/27/2010  . CAD 12/12/2009  . Aortic aneurysm of unspecified site without mention of rupture 06/27/2009  . DYSPNEA 06/27/2009  . DYSLIPIDEMIA 12/12/2008  . Essential hypertension 12/12/2008  . GERD 12/12/2008  . OSTEOARTHRITIS 12/12/2008   Outpatient Encounter Prescriptions as of 08/30/2015    Medication Sig  . aspirin 81 MG tablet Take 81 mg by mouth every evening.   . Calcium Carb-Cholecalciferol 600-500 MG-UNIT CAPS Take 1 capsule by mouth every morning.  . Cholecalciferol 1000 UNITS tablet Take 1,000 Units by mouth daily.   . Cinnamon 500 MG capsule Take 500 mg by mouth daily.   Marland Kitchen diltiazem (CARDIZEM CD) 180 MG 24 hr capsule Take 1 capsule (180 mg total) by mouth daily.  Marland Kitchen ESTRING 2 MG vaginal ring   . Flaxseed, Linseed, (FLAX SEED OIL PO) Take 1 tablet by mouth daily.  . fluticasone (FLONASE) 50 MCG/ACT nasal spray Place 2 sprays into both nostrils daily.  . furosemide (LASIX) 20 MG tablet Take 40 mg by mouth daily as needed for fluid or edema.  Marland Kitchen lisinopril-hydrochlorothiazide (PRINZIDE,ZESTORETIC) 20-12.5 MG tablet TAKE ONE TABLET BY MOUTH ONE TIME DAILY  . metFORMIN (GLUCOPHAGE) 500 MG tablet TAKE ONE TABLET BY MOUTH TWICE DAILY  . Multiple Vitamins-Minerals (ICAPS AREDS 2 PO) Take 1 capsule by mouth 2 (two) times daily.  . multivitamin (THERAGRAN) per tablet Take 1 tablet by mouth daily.    . Omega-3 Fatty Acids (FISH OIL) 1000 MG CAPS Take 2,000 mg by mouth at bedtime.  Marland Kitchen omeprazole (PRILOSEC) 40 MG capsule Take 40 mg by mouth 2 (two) times daily.  . ONE TOUCH ULTRA TEST test strip USE TO CHECK BLOOD GLUCOSE ONCE DAILY  . ONETOUCH DELICA LANCETS 41L MISC USE TO CHECK BLOOD SUGAR ONCE DAILY  . Probiotic Product (PROBIOTIC DAILY PO) Take 1 capsule by mouth daily.  Marland Kitchen  traMADol (ULTRAM) 50 MG tablet Take 50 mg by mouth at bedtime as needed for moderate pain.   Marland Kitchen trimethoprim (TRIMPEX) 100 MG tablet Take 100 mg by mouth at bedtime.  . Zoledronic Acid (RECLAST IV) Inject into the vein. Administered by Dr Carloyn Manner at [facility]   No facility-administered encounter medications on file as of 08/30/2015.      Review of Systems  Constitutional: Positive for fatigue.  HENT: Negative.   Eyes: Negative.   Respiratory: Negative.   Cardiovascular: Negative.   Gastrointestinal:  Negative.   Endocrine: Negative.   Genitourinary: Positive for dysuria (with vaginal irritation).  Musculoskeletal: Negative.   Skin: Negative.   Allergic/Immunologic: Negative.   Neurological: Negative.   Hematological: Negative.   Psychiatric/Behavioral: Negative.        Objective:   Physical Exam  Constitutional: She is oriented to person, place, and time. She appears well-developed and well-nourished. No distress.  Elderly but pleasant and alert  HENT:  Head: Normocephalic and atraumatic.  Right Ear: External ear normal.  Left Ear: External ear normal.  Nose: Nose normal.  Mouth/Throat: Oropharynx is clear and moist.  Eyes: Conjunctivae and EOM are normal. Pupils are equal, round, and reactive to light. Right eye exhibits no discharge. Left eye exhibits no discharge. No scleral icterus.  Neck: Normal range of motion. Neck supple. No thyromegaly present.  Cardiovascular: Normal rate, regular rhythm, normal heart sounds and intact distal pulses.   No murmur heard. Heart has a regular rate and rhythm at 72/m  Pulmonary/Chest: Effort normal and breath sounds normal. No respiratory distress. She has no wheezes. She has no rales. She exhibits no tenderness.  Clear anteriorly and posteriorly  Abdominal: Soft. Bowel sounds are normal. She exhibits no mass. There is tenderness. There is no rebound and no guarding.  Generalized upper abdominal tenderness and suprapubic tenderness  Musculoskeletal: She exhibits no edema.  The patient uses a cane and has a gait disturbance secondary to her back pain and is slow with her movement.  Lymphadenopathy:    She has no cervical adenopathy.  Neurological: She is alert and oriented to person, place, and time. She has normal reflexes. No cranial nerve deficit.  Skin: Skin is warm and dry. No rash noted.  Psychiatric: She has a normal mood and affect. Her behavior is normal. Judgment and thought content normal.  Nursing note and vitals  reviewed.    BP 124/76 mmHg  Pulse 69  Temp(Src) 97 F (36.1 C) (Oral)  Ht _0  (1.575 m)  Wt 172 lb (78.019 kg)  BMI 31.45 kg/m2      Assessment & Plan:  1. Type 2 diabetes mellitus without complication, without long-term current use of insulin (HCC) -Continue current blood pressure medicine and continue with aggressive therapeutic lifestyle changes as possible to keep weight down and keep blood sugar under control - POCT glycosylated hemoglobin (Hb A1C) - BMP8+EGFR - CBC with Differential/Platelet - NMR, lipoprofile  2. Gastroesophageal reflux disease, esophagitis presence not specified -The patient indicates that she still having some problems with this and she should continue with her omeprazole as doing. - CBC with Differential/Platelet - Hepatic function panel  3. Essential hypertension -The blood pressure is good today and she will continue with current treatment - BMP8+EGFR - CBC with Differential/Platelet - Hepatic function panel - NMR, lipoprofile  4. Vitamin D deficiency -Continue with vitamin D replacement pending results of lab work - CBC with Differential/Platelet - VITAMIN D 25 Hydroxy (Vit-D Deficiency, Fractures)  5.  Other fatigue -Continue with current treatment regimen pending results of lab work - CBC with Differential/Platelet - Thyroid Panel With TSH - POCT UA - Microscopic Only - POCT urinalysis dipstick - Urine culture  6. Difficulty urinating -Check urinalysis and keep appointment with urology - POCT UA - Microscopic Only - POCT urinalysis dipstick - Urine culture  Patient Instructions                       Medicare Annual Wellness Visit  [place] and the medical providers at [facility] strive to bring you the best medical care.  In doing so we not only want to address your current medical conditions and concerns but also to detect new conditions early and prevent illness, disease and health-related problems.     Medicare offers a yearly Wellness Visit which allows our clinical staff to assess your need for preventative services including immunizations, lifestyle education, counseling to decrease risk of preventable diseases and screening for fall risk and other medical concerns.    This visit is provided free of charge (no copay) for all Medicare recipients. The clinical pharmacists at [facility] have begun to conduct these Wellness Visits which will also include a thorough review of all your medications.    As you primary medical provider recommend that you make an appointment for your Annual Wellness Visit if you have not done so already this year.  You may set up this appointment before you leave today or you may call back ([phone]) and schedule an appointment.  Please make sure when you call that you mention that you are scheduling your Annual Wellness Visit with the clinical pharmacist so that the appointment may be made for the proper length of time.     Continue current medications. Continue good therapeutic lifestyle changes which include good diet and exercise. Fall precautions discussed with patient. If an FOBT was given today- please return it to our front desk. If you are over 19 years old - you may need Prevnar 52 or the adult Pneumonia vaccine.  **Flu shots are available--- please call and schedule a FLU-CLINIC appointment**  After your visit with [place] today you will receive a survey in the mail or online from [employer] regarding your care with [place]. Please take a moment to fill this out. Your feedback is very important to [place] as you can help [place] better understand your patient needs as well as improve your experience and satisfaction. WE CARE ABOUT YOU!!!   The patient should continue to work on weight with diet and exercise  Keep appointment with urology please discuss with him the discomfort you're having with voiding and the vaginal irritation Continue to monitor  blood sugars at home and watch diet as closely as possible Keep appointment with cardiology     Arrie Senate MD

## 2015-08-31 LAB — HEPATIC FUNCTION PANEL
ALBUMIN: 4.1 g/dL (ref 3.5–4.7)
ALK PHOS: 62 IU/L (ref 39–117)
ALT: 17 IU/L (ref 0–32)
AST: 17 IU/L (ref 0–40)
Bilirubin Total: 0.5 mg/dL (ref 0.0–1.2)
Bilirubin, Direct: 0.12 mg/dL (ref 0.00–0.40)
TOTAL PROTEIN: 6.3 g/dL (ref 6.0–8.5)

## 2015-08-31 LAB — CBC WITH DIFFERENTIAL/PLATELET
BASOS ABS: 0 10*3/uL (ref 0.0–0.2)
Basos: 0 %
EOS (ABSOLUTE): 0 10*3/uL (ref 0.0–0.4)
Eos: 1 %
HEMOGLOBIN: 13.3 g/dL (ref 11.1–15.9)
Hematocrit: 38.3 % (ref 34.0–46.6)
IMMATURE GRANS (ABS): 0 10*3/uL (ref 0.0–0.1)
Immature Granulocytes: 0 %
LYMPHS ABS: 2.7 10*3/uL (ref 0.7–3.1)
LYMPHS: 31 %
MCH: 31.4 pg (ref 26.6–33.0)
MCHC: 34.7 g/dL (ref 31.5–35.7)
MCV: 91 fL (ref 79–97)
MONOCYTES: 9 %
Monocytes Absolute: 0.8 10*3/uL (ref 0.1–0.9)
Neutrophils Absolute: 5.2 10*3/uL (ref 1.4–7.0)
Neutrophils: 59 %
Platelets: 336 10*3/uL (ref 150–379)
RBC: 4.23 x10E6/uL (ref 3.77–5.28)
RDW: 13.1 % (ref 12.3–15.4)
WBC: 8.8 10*3/uL (ref 3.4–10.8)

## 2015-08-31 LAB — URINE CULTURE: ORGANISM ID, BACTERIA: NO GROWTH

## 2015-08-31 LAB — BMP8+EGFR
BUN/Creatinine Ratio: 13 (ref 11–26)
BUN: 11 mg/dL (ref 8–27)
CALCIUM: 9.1 mg/dL (ref 8.7–10.3)
CHLORIDE: 85 mmol/L — AB (ref 96–106)
CO2: 24 mmol/L (ref 18–29)
Creatinine, Ser: 0.82 mg/dL (ref 0.57–1.00)
GFR calc Af Amer: 76 mL/min/{1.73_m2} (ref >59)
GFR calc non Af Amer: 66 mL/min/{1.73_m2} (ref >59)
GLUCOSE: 160 mg/dL — AB (ref 65–99)
POTASSIUM: 4.7 mmol/L (ref 3.5–5.2)
Sodium: 124 mmol/L — ABNORMAL LOW (ref 134–144)

## 2015-08-31 LAB — NMR, LIPOPROFILE
CHOLESTEROL: 208 mg/dL — AB (ref 100–199)
HDL Cholesterol by NMR: 42 mg/dL (ref >39)
HDL PARTICLE NUMBER: 32.4 umol/L (ref >=30.5)
LDL PARTICLE NUMBER: 2050 nmol/L — AB (ref <1000)
LDL SIZE: 19.8 nm (ref >20.5)
LDL-C: 125 mg/dL — ABNORMAL HIGH (ref 0–99)
LP-IR SCORE: 65 — AB (ref <=45)
SMALL LDL PARTICLE NUMBER: 1308 nmol/L — AB (ref <=527)
TRIGLYCERIDES BY NMR: 203 mg/dL — AB (ref 0–149)

## 2015-08-31 LAB — THYROID PANEL WITH TSH
Free Thyroxine Index: 2.1 (ref 1.2–4.9)
T3 Uptake Ratio: 29 % (ref 24–39)
T4 TOTAL: 7.3 ug/dL (ref 4.5–12.0)
TSH: 1.95 u[IU]/mL (ref 0.450–4.500)

## 2015-08-31 LAB — VITAMIN D 25 HYDROXY (VIT D DEFICIENCY, FRACTURES): Vit D, 25-Hydroxy: 44.4 ng/mL (ref 30.0–100.0)

## 2015-08-31 NOTE — Addendum Note (Signed)
Addended by: Thana Ates on: 08/31/2015 11:07 AM   Modules accepted: Orders

## 2015-09-06 ENCOUNTER — Other Ambulatory Visit: Payer: Medicare Other

## 2015-09-06 DIAGNOSIS — E871 Hypo-osmolality and hyponatremia: Secondary | ICD-10-CM | POA: Diagnosis not present

## 2015-09-07 LAB — BMP8+EGFR
BUN / CREAT RATIO: 13 (ref 11–26)
BUN: 12 mg/dL (ref 8–27)
CALCIUM: 9.3 mg/dL (ref 8.7–10.3)
CO2: 25 mmol/L (ref 18–29)
CREATININE: 0.91 mg/dL (ref 0.57–1.00)
Chloride: 91 mmol/L — ABNORMAL LOW (ref 96–106)
GFR calc non Af Amer: 58 mL/min/{1.73_m2} — ABNORMAL LOW (ref >59)
GFR, EST AFRICAN AMERICAN: 67 mL/min/{1.73_m2} (ref >59)
Glucose: 144 mg/dL — ABNORMAL HIGH (ref 65–99)
Potassium: 4.6 mmol/L (ref 3.5–5.2)
Sodium: 130 mmol/L — ABNORMAL LOW (ref 134–144)

## 2015-09-10 ENCOUNTER — Encounter: Payer: Self-pay | Admitting: Nurse Practitioner

## 2015-09-10 ENCOUNTER — Ambulatory Visit (INDEPENDENT_AMBULATORY_CARE_PROVIDER_SITE_OTHER): Payer: Medicare Other | Admitting: Nurse Practitioner

## 2015-09-10 VITALS — BP 154/87 | HR 88 | Temp 97.8°F | Ht 62.0 in | Wt 175.8 lb

## 2015-09-10 DIAGNOSIS — J0100 Acute maxillary sinusitis, unspecified: Secondary | ICD-10-CM | POA: Diagnosis not present

## 2015-09-10 MED ORDER — AZITHROMYCIN 250 MG PO TABS: ORAL_TABLET | ORAL | Status: DC

## 2015-09-10 NOTE — Progress Notes (Signed)
  Subjective:     Debra Phillips is a 82 y.o. female who presents for evaluation of sinus pain. Symptoms include: congestion, epistaxis, facial pain, headaches, post nasal drip and sinus pressure. Onset of symptoms was 5 days ago. Symptoms have been gradually worsening since that time. Past history is significant for no history of pneumonia or bronchitis. Patient is a non-smoker. Has been taking alkaseltzer plus OTC with  No relief.  The following portions of the patient's history were reviewed and updated as appropriate: allergies, current medications, past family history, past medical history, past social history, past surgical history and problem list.  Review of Systems Pertinent items are noted in HPI.   Objective:    BP 154/87 mmHg  Pulse 88  Temp(Src) 97.8 F (36.6 C) (Oral)  Ht 5\' 2"  (1.575 m)  Wt 175 lb 12.8 oz (79.742 kg)  BMI 32.15 kg/m2 General appearance: alert and cooperative Eyes: conjunctivae/corneas clear. PERRL, EOM's intact. Fundi benign. Ears: normal TM's and external ear canals both ears Nose: Nares normal. Septum midline. Mucosa normal. No drainage or sinus tenderness., clear discharge, moderate congestion, turbinates red, mild maxillary sinus tenderness bilateral, mild frontal sinus tenderness bilateral Throat: lips, mucosa, and tongue normal; teeth and gums normal Neck: no adenopathy, no carotid bruit, supple, symmetrical, trachea midline and thyroid not enlarged, symmetric, no tenderness/mass/nodules Lungs: clear to auscultation bilaterally and dry cough Heart: regular rate and rhythm, S1, S2 normal, no murmur, click, rub or gallop    Assessment:    Acute bacterial sinusitis.    Plan:  1. Take meds as prescribed 2. Use a cool mist humidifier especially during the winter months and when heat has been humid. 3. Use saline nose sprays frequently 4. Saline irrigations of the nose can be very helpful if done frequently.  * 4X daily for 1 week*  * Use of a  nettie pot can be helpful with this. Follow directions with this* 5. Drink plenty of fluids 6. Keep thermostat turn down low 7.For any cough or congestion  Use plain Mucinex- regular strength or max strength is fine   * Children- consult with Pharmacist for dosing 8. For fever or aces or pains- take tylenol or ibuprofen appropriate for age and weight.  * for fevers greater than 101 orally you may alternate ibuprofen and tylenol every  3 hours.   Meds ordered this encounter  Medications  . azithromycin (ZITHROMAX) 250 MG tablet    Sig: Two tablets day one, then one tablet daily next 4 days.    Dispense:  6 tablet    Refill:  0    Order Specific Question:  Supervising Provider    Answer:  Chipper Herb [id number]   Mary-Margaret Hassell Done, FNP

## 2015-09-10 NOTE — Patient Instructions (Signed)

## 2015-09-19 ENCOUNTER — Other Ambulatory Visit: Payer: Self-pay | Admitting: *Deleted

## 2015-09-19 MED ORDER — OMEPRAZOLE 40 MG PO CPDR: 40.0000 mg | DELAYED_RELEASE_CAPSULE | Freq: Two times a day (BID) | ORAL | Status: DC

## 2015-09-20 DIAGNOSIS — N952 Postmenopausal atrophic vaginitis: Secondary | ICD-10-CM | POA: Diagnosis not present

## 2015-09-20 DIAGNOSIS — Z0000 Encounter for general adult medical examination without abnormal findings: Secondary | ICD-10-CM | POA: Diagnosis not present

## 2015-09-23 ENCOUNTER — Other Ambulatory Visit: Payer: Self-pay | Admitting: *Deleted

## 2015-09-23 MED ORDER — OMEPRAZOLE 40 MG PO CPDR: 40.0000 mg | DELAYED_RELEASE_CAPSULE | Freq: Two times a day (BID) | ORAL | Status: DC

## 2015-09-23 MED ORDER — DILTIAZEM HCL ER COATED BEADS 180 MG PO CP24: 180.0000 mg | ORAL_CAPSULE | Freq: Every day | ORAL | Status: DC

## 2015-10-09 ENCOUNTER — Ambulatory Visit (INDEPENDENT_AMBULATORY_CARE_PROVIDER_SITE_OTHER): Payer: Medicare Other | Admitting: Pediatrics

## 2015-10-09 ENCOUNTER — Encounter: Payer: Self-pay | Admitting: Pediatrics

## 2015-10-09 VITALS — BP 135/80 | HR 84 | Temp 96.9°F | Ht 62.0 in | Wt 171.6 lb

## 2015-10-09 DIAGNOSIS — J0191 Acute recurrent sinusitis, unspecified: Secondary | ICD-10-CM | POA: Diagnosis not present

## 2015-10-09 DIAGNOSIS — R062 Wheezing: Secondary | ICD-10-CM

## 2015-10-09 DIAGNOSIS — I10 Essential (primary) hypertension: Secondary | ICD-10-CM

## 2015-10-09 MED ORDER — AMOXICILLIN-POT CLAVULANATE 875-125 MG PO TABS: 1.0000 | ORAL_TABLET | Freq: Two times a day (BID) | ORAL | Status: DC

## 2015-10-09 MED ORDER — SPACER/AERO CHAMBER MOUTHPIECE MISC: Status: DC

## 2015-10-09 MED ORDER — ALBUTEROL SULFATE HFA 108 (90 BASE) MCG/ACT IN AERS: 2.0000 | INHALATION_SPRAY | Freq: Four times a day (QID) | RESPIRATORY_TRACT | Status: DC | PRN

## 2015-10-09 NOTE — Patient Instructions (Signed)
Use inhaler three times a day  Start antibiotics for sinus infection

## 2015-10-09 NOTE — Progress Notes (Signed)
Subjective:    Patient ID: Debra Phillips, female    DOB: 11-Apr-1929, 82 y.o.   MRN: [id number]  CC: Nasal Congestion and Cough   HPI: Debra Phillips is a 82 y.o. female presenting for Nasal Congestion and Cough  Ears stopped up Head feels congested Coughing a lot constantly Was treated with azithromycin 1 month ago, got slightly better at that time but came back quickly Having night sweats  No swelling in legs Sometimes gets SOB at home No chest pain,  HA  H/o diabetes, BGL control improving with numbers at home per pt    Depression screen [facility] 2/9 08/30/2015 06/15/2015 05/08/2015 01/01/2015 09/01/2014  Decreased Interest 0 0 0 0 1  Down, Depressed, Hopeless 1 1 0 0 1  PHQ - 2 Score 1 1 0 0 2  Altered sleeping - - - - 0  Tired, decreased energy - - - - 1  Change in appetite - - - - 0  Feeling bad or failure about yourself  - - - - 0  Trouble concentrating - - - - 0  Moving slowly or fidgety/restless - - - - 0  Suicidal thoughts - - - - 0  PHQ-9 Score - - - - 3     Relevant past medical, surgical, family and social history reviewed and updated as indicated. Interim medical history since our last visit reviewed. Allergies and medications reviewed and updated.    ROS: Per HPI unless specifically indicated above  History  Smoking status  . Never Smoker   Smokeless tobacco  . Never Used    Past Medical History Patient Active Problem List   Diagnosis Date Noted  . Osteoarthritis of lumbar spine 05/08/2015  . Spondylosis of lumbar region without myelopathy or radiculopathy 09/01/2014  . Diabetes ([place]) 04/26/2014  . Vitamin D deficiency 04/26/2014  . Chronic cystitis 04/26/2014  . Atrophic vaginitis 04/26/2014  . Solitary pulmonary nodule 03/20/2014  . Chest pain 03/18/2014  . Osteoporotic compression fracture of spine with routine healing 03/08/2014  . Precordial pain 02/16/2012  . PALPITATIONS 02/27/2010  . CAD 12/12/2009  . Aortic aneurysm of  unspecified site without mention of rupture 06/27/2009  . DYSPNEA 06/27/2009  . DYSLIPIDEMIA 12/12/2008  . Essential hypertension 12/12/2008  . GERD 12/12/2008  . OSTEOARTHRITIS 12/12/2008       Objective:    BP 135/80 mmHg  Pulse 84  Temp(Src) 96.9 F (36.1 C) (Oral)  Ht 5\' 2"  (1.575 m)  Wt 171 lb 9.6 oz (77.837 kg)  BMI 31.38 kg/m2  Wt Readings from Last 3 Encounters:  10/09/15 171 lb 9.6 oz (77.837 kg)  09/10/15 175 lb 12.8 oz (79.742 kg)  08/30/15 172 lb (78.019 kg)     Gen: NAD, alert, cooperative with exam, NCAT EYES: EOMI, no scleral injection or icterus ENT:  TMs pearly gray, slightly splayed LR b/l, OP without erythema, tender over max and frontal sinuses b/l LYMPH: no cervical LAD CV: NRRR, normal S1/S2, no murmur, distal pulses 2+ b/l Resp: exp wheeze with forced exhalation posteriorly, with comfortable WOB wheeze heard anteriorly, normal WOB, no crackles, moving air well Ext: No edema, warm Neuro: Alert and oriented MSK: normal muscle bulk     Assessment & Plan:    [place] was seen today for nasal congestion and cough.  Diagnoses and all orders for this visit:  Acute recurrent sinusitis, unspecified location Treated with azithromycin 1 mo ago, return of symptoms within a week of treatment. Treat as below. -  amoxicillin-clavulanate (AUGMENTIN) 875-125 MG tablet; Take 1 tablet by mouth 2 (two) times daily.  Essential hypertension Well controlled today, cont current treatments  Wheezing Exp wheeze with forced exhalation, normal WOB. No edema, no crackles. Think likely related to ongoing URI symptoms and illness over cardiac wheeze. Trial albuterol as pt's main complaint ongoing cough. -     albuterol (PROVENTIL HFA;VENTOLIN HFA) 108 (90 Base) MCG/ACT inhaler; Inhale 2 puffs into the lungs every 6 (six) hours as needed for wheezing or shortness of breath. -     Spacer/Aero Chamber Mouthpiece MISC; Please dispense one spacer for use with  inhaler.   Follow up plan: Return if symptoms worsen or fail to improve.  Assunta Found, MD [facility] 10/09/2015, 10:13 AM

## 2015-10-23 ENCOUNTER — Telehealth: Payer: Self-pay | Admitting: Family Medicine

## 2015-10-23 NOTE — Telephone Encounter (Signed)
Pt says her chest is better today  - she feels somewhat nauseated - she just ate.  ntbs

## 2015-10-24 ENCOUNTER — Encounter: Payer: Self-pay | Admitting: Family Medicine

## 2015-10-24 ENCOUNTER — Ambulatory Visit (INDEPENDENT_AMBULATORY_CARE_PROVIDER_SITE_OTHER): Payer: Medicare Other | Admitting: Family Medicine

## 2015-10-24 VITALS — BP 130/80 | HR 81 | Temp 98.6°F | Ht 64.0 in | Wt 174.0 lb

## 2015-10-24 DIAGNOSIS — R0789 Other chest pain: Secondary | ICD-10-CM

## 2015-10-24 DIAGNOSIS — B373 Candidiasis of vulva and vagina: Secondary | ICD-10-CM | POA: Diagnosis not present

## 2015-10-24 DIAGNOSIS — N9489 Other specified conditions associated with female genital organs and menstrual cycle: Secondary | ICD-10-CM | POA: Diagnosis not present

## 2015-10-24 DIAGNOSIS — I498 Other specified cardiac arrhythmias: Secondary | ICD-10-CM

## 2015-10-24 DIAGNOSIS — Z01810 Encounter for preprocedural cardiovascular examination: Secondary | ICD-10-CM | POA: Diagnosis not present

## 2015-10-24 DIAGNOSIS — J301 Allergic rhinitis due to pollen: Secondary | ICD-10-CM | POA: Diagnosis not present

## 2015-10-24 DIAGNOSIS — R102 Pelvic and perineal pain: Secondary | ICD-10-CM

## 2015-10-24 DIAGNOSIS — B3731 Acute candidiasis of vulva and vagina: Secondary | ICD-10-CM

## 2015-10-24 MED ORDER — TRIMETHOPRIM 100 MG PO TABS: 100.0000 mg | ORAL_TABLET | Freq: Every day | ORAL | Status: DC

## 2015-10-24 MED ORDER — FLUTICASONE PROPIONATE 50 MCG/ACT NA SUSP: 2.0000 | Freq: Every day | NASAL | Status: DC

## 2015-10-24 MED ORDER — FLUCONAZOLE 150 MG PO TABS: 150.0000 mg | ORAL_TABLET | Freq: Once | ORAL | Status: DC

## 2015-10-24 MED ORDER — LISINOPRIL-HYDROCHLOROTHIAZIDE 20-12.5 MG PO TABS: 1.0000 | ORAL_TABLET | Freq: Every day | ORAL | Status: DC

## 2015-10-24 MED ORDER — OMEPRAZOLE 40 MG PO CPDR: 40.0000 mg | DELAYED_RELEASE_CAPSULE | Freq: Two times a day (BID) | ORAL | Status: DC

## 2015-10-24 MED ORDER — DILTIAZEM HCL ER COATED BEADS 180 MG PO CP24: 180.0000 mg | ORAL_CAPSULE | Freq: Every day | ORAL | Status: DC

## 2015-10-24 MED ORDER — METFORMIN HCL 500 MG PO TABS: 500.0000 mg | ORAL_TABLET | Freq: Two times a day (BID) | ORAL | Status: DC

## 2015-10-24 MED ORDER — FUROSEMIDE 20 MG PO TABS: 40.0000 mg | ORAL_TABLET | Freq: Every day | ORAL | Status: DC | PRN

## 2015-10-24 NOTE — Addendum Note (Signed)
Addended by: Thana Ates on: 10/24/2015 11:21 AM   Modules accepted: Orders

## 2015-10-24 NOTE — Progress Notes (Signed)
Subjective:    Patient ID: Debra Phillips, female    DOB: 1929-05-23, 82 y.o.   MRN: [id number]  HPI Patient here today to follow up on episode from Monday night where she felt chest tightness and heart fluttering. The patient said that she felt uncomfortable but went out and has something to eat and drink and she did have sweet tea. She came back and started experiencing the fluttering sensation in the chest tightness and this lasted for several hours and she refused to go be evaluated. She is been doing better since that time and only complains of weakness. She has had some nausea. She also has had a yeast infection from taking the antibiotic for her sinuses. The patient has had recent lab work and this was in January. She had a CBC BMP and thyroid profile and all of these were within normal limits. She has an appointment to follow-up with the urologist soon and is contemplating having her bladder repaired.      Patient Active Problem List   Diagnosis Date Noted  . Osteoarthritis of lumbar spine 05/08/2015  . Spondylosis of lumbar region without myelopathy or radiculopathy 09/01/2014  . Diabetes ([place]) 04/26/2014  . Vitamin D deficiency 04/26/2014  . Chronic cystitis 04/26/2014  . Atrophic vaginitis 04/26/2014  . Solitary pulmonary nodule 03/20/2014  . Chest pain 03/18/2014  . Osteoporotic compression fracture of spine with routine healing 03/08/2014  . Precordial pain 02/16/2012  . PALPITATIONS 02/27/2010  . CAD 12/12/2009  . Aortic aneurysm of unspecified site without mention of rupture 06/27/2009  . DYSPNEA 06/27/2009  . DYSLIPIDEMIA 12/12/2008  . Essential hypertension 12/12/2008  . GERD 12/12/2008  . OSTEOARTHRITIS 12/12/2008   Outpatient Encounter Prescriptions as of 10/24/2015  Medication Sig  . albuterol (PROVENTIL HFA;VENTOLIN HFA) 108 (90 Base) MCG/ACT inhaler Inhale 2 puffs into the lungs every 6 (six) hours as needed for wheezing or shortness of breath.  Marland Kitchen  amoxicillin-clavulanate (AUGMENTIN) 875-125 MG tablet Take 1 tablet by mouth 2 (two) times daily.  Marland Kitchen aspirin 81 MG tablet Take 81 mg by mouth every evening.   . Calcium Carb-Cholecalciferol 600-500 MG-UNIT CAPS Take 1 capsule by mouth every morning.  . Cholecalciferol 1000 UNITS tablet Take 1,000 Units by mouth daily.   . Cinnamon 500 MG capsule Take 500 mg by mouth daily.   Marland Kitchen diltiazem (CARDIZEM CD) 180 MG 24 hr capsule Take 1 capsule (180 mg total) by mouth daily.  Marland Kitchen ESTRING 2 MG vaginal ring   . Flaxseed, Linseed, (FLAX SEED OIL PO) Take 1 tablet by mouth daily.  . fluticasone (FLONASE) 50 MCG/ACT nasal spray Place 2 sprays into both nostrils daily.  . furosemide (LASIX) 20 MG tablet Take 40 mg by mouth daily as needed for fluid or edema. Reported on 09/10/2015  . lisinopril-hydrochlorothiazide (PRINZIDE,ZESTORETIC) 20-12.5 MG tablet TAKE ONE TABLET BY MOUTH ONE TIME DAILY  . metFORMIN (GLUCOPHAGE) 500 MG tablet TAKE ONE TABLET BY MOUTH TWICE DAILY  . Multiple Vitamins-Minerals (ICAPS AREDS 2 PO) Take 1 capsule by mouth 2 (two) times daily.  . multivitamin (THERAGRAN) per tablet Take 1 tablet by mouth daily.    . Omega-3 Fatty Acids (FISH OIL) 1000 MG CAPS Take 2,000 mg by mouth at bedtime.  Marland Kitchen omeprazole (PRILOSEC) 40 MG capsule Take 1 capsule (40 mg total) by mouth 2 (two) times daily.  . ONE TOUCH ULTRA TEST test strip USE TO CHECK BLOOD GLUCOSE ONCE DAILY  . ONETOUCH DELICA LANCETS 99991111 MISC USE TO CHECK  BLOOD SUGAR ONCE DAILY  . Probiotic Product (PROBIOTIC DAILY PO) Take 1 capsule by mouth daily.  Marland Kitchen Spacer/Aero Chamber Mouthpiece MISC Please dispense one spacer for use with inhaler.  . traMADol (ULTRAM) 50 MG tablet Take 50 mg by mouth at bedtime as needed for moderate pain.   Marland Kitchen trimethoprim (TRIMPEX) 100 MG tablet Take 100 mg by mouth at bedtime.  . Zoledronic Acid (RECLAST IV) Inject into the vein. Administered by Dr Carloyn Manner at [facility]   No facility-administered encounter  medications on file as of 10/24/2015.      Review of Systems  Constitutional: Positive for fatigue.  HENT: Negative.   Eyes: Negative.   Respiratory: Negative.   Cardiovascular: Positive for palpitations (with tightness).  Gastrointestinal: Positive for nausea.  Endocrine: Negative.   Genitourinary: Positive for vaginal discharge.  Musculoskeletal: Negative.   Skin: Negative.   Allergic/Immunologic: Negative.   Neurological: Negative.   Hematological: Negative.   Psychiatric/Behavioral: Negative.        Objective:   Physical Exam  Constitutional: She is oriented to person, place, and time. She appears well-developed and well-nourished. No distress.  HENT:  Head: Normocephalic and atraumatic.  Eyes: Conjunctivae and EOM are normal. Pupils are equal, round, and reactive to light. Right eye exhibits no discharge. Left eye exhibits no discharge. No scleral icterus.  Neck: Normal range of motion. No thyromegaly present.  Cardiovascular: Normal rate, regular rhythm and normal heart sounds.   No murmur heard. At 72/m with a regular rate and rhythm  Pulmonary/Chest: Effort normal and breath sounds normal. No respiratory distress. She has no wheezes. She has no rales.  Clear anteriorly and posteriorly  Abdominal: Soft. Bowel sounds are normal. She exhibits no mass. There is no tenderness. There is no rebound and no guarding.  Suprapubic tenderness  Musculoskeletal: Normal range of motion. She exhibits no tenderness.  Neurological: She is alert and oriented to person, place, and time.  Skin: Skin is warm and dry. No rash noted.  Psychiatric: She has a normal mood and affect. Her behavior is normal. Judgment and thought content normal.  Nursing note and vitals reviewed.   BP 147/75 mmHg  Pulse 81  Temp(Src) 98.6 F (37 C) (Oral)  Ht 5\' 4"  (1.626 m)  Wt 174 lb (78.926 kg)  BMI 29.85 kg/m2  Repeat blood pressure right arm 130/80     Assessment & Plan:  1. Chest  tightness -Follow-up with cardiology - EKG 12-Lead  2. Fluttering heart (HCC) -Avoid caffeine  3. Yeast vaginitis -Take Diflucan 1 daily for 3 days 150 mg  4. Pelvic pressure in female -Follow-up with urology  No orders of the defined types were placed in this encounter.   Patient Instructions  We will arrange for you to have a visit with the cardiologist because of this episode of chest tightness and flutter sensation that you had in your chest 2 nights ago that lasted for several hours. Please avoid all caffeine, cold drinks coffee and tea Mountain Dew use malleolus Please continue to follow-up with the urologist as far as the bladder prolapse. Discussed with the cardiologist if you would be a candidate for surgery are not based on your heart. Continue to drink plenty of fluids and stay as active as possible Take the yeast medicine as directed and in the future remind the medical provider that prescribes an antibiotic that you have a tendency to develop yeast infections   Arrie Senate MD

## 2015-10-24 NOTE — Patient Instructions (Signed)
We will arrange for you to have a visit with the cardiologist because of this episode of chest tightness and flutter sensation that you had in your chest 2 nights ago that lasted for several hours. Please avoid all caffeine, cold drinks coffee and tea Mountain Dew use malleolus Please continue to follow-up with the urologist as far as the bladder prolapse. Discussed with the cardiologist if you would be a candidate for surgery are not based on your heart. Continue to drink plenty of fluids and stay as active as possible Take the yeast medicine as directed and in the future remind the medical provider that prescribes an antibiotic that you have a tendency to develop yeast infections

## 2015-11-01 NOTE — Progress Notes (Signed)
HPI The patient presents for followup of chest pain.  She has been seen for this previously. A stress perfusion study in 2015 was negative for any evidence of ischemia. She had an episode last week of chest discomfort. It happened at rest and at night. She lives by herself. She held her chest little tight. Her blood pressure became elevated. Her heart rate went up to 104. She said it lasted a couple of hours and then went away on its own. Since then she's not had any further episodes. She has multiple complaints including lots of GI problems with muscle aches and pains and chronic fatigue. She does do some of her household chores but is slow with this.   Allergies  Allergen Reactions  . Codeine Nausea And Vomiting  . Doxycycline Hives  . Other Nausea Only    Almost all antibiotics  . Sulfonamide Derivatives Hives  . Vancomycin Itching    Current Outpatient Prescriptions  Medication Sig Dispense Refill  . albuterol (PROVENTIL HFA;VENTOLIN HFA) 108 (90 Base) MCG/ACT inhaler Inhale 2 puffs into the lungs every 6 (six) hours as needed for wheezing or shortness of breath. 1 Inhaler 2  . aspirin 81 MG tablet Take 81 mg by mouth every evening.     . Calcium Carb-Cholecalciferol 600-500 MG-UNIT CAPS Take 1 capsule by mouth every morning.    . Cholecalciferol 1000 UNITS tablet Take 1,000 Units by mouth daily.     . Cinnamon 500 MG capsule Take 500 mg by mouth daily.     Marland Kitchen diltiazem (CARDIZEM CD) 180 MG 24 hr capsule Take 1 capsule (180 mg total) by mouth daily. 90 capsule 1  . ESTRING 2 MG vaginal ring     . Flaxseed, Linseed, (FLAX SEED OIL PO) Take 1 tablet by mouth daily.    . fluticasone (FLONASE) 50 MCG/ACT nasal spray Place 2 sprays into both nostrils daily. 16 g 2  . furosemide (LASIX) 20 MG tablet Take 2 tablets (40 mg total) by mouth daily as needed for fluid or edema. Reported on 09/10/2015 30 tablet 2  . lisinopril-hydrochlorothiazide (PRINZIDE,ZESTORETIC) 20-12.5 MG tablet Take 1  tablet by mouth daily. 30 tablet 3  . metFORMIN (GLUCOPHAGE) 500 MG tablet Take 1 tablet (500 mg total) by mouth 2 (two) times daily. 60 tablet 3  . Multiple Vitamins-Minerals (ICAPS AREDS 2 PO) Take 1 capsule by mouth 2 (two) times daily.    . multivitamin (THERAGRAN) per tablet Take 1 tablet by mouth daily.      . Omega-3 Fatty Acids (FISH OIL) 1000 MG CAPS Take 2,000 mg by mouth at bedtime.    Marland Kitchen omeprazole (PRILOSEC) 40 MG capsule Take 1 capsule (40 mg total) by mouth 2 (two) times daily. 60 capsule 5  . ONE TOUCH ULTRA TEST test strip USE TO CHECK BLOOD GLUCOSE ONCE DAILY 50 each 3  . ONETOUCH DELICA LANCETS 99991111 MISC USE TO CHECK BLOOD SUGAR ONCE DAILY 100 each 2  . Probiotic Product (PROBIOTIC DAILY PO) Take 1 capsule by mouth daily.    Marland Kitchen Spacer/Aero Chamber Mouthpiece MISC Please dispense one spacer for use with inhaler. 1 each 0  . traMADol (ULTRAM) 50 MG tablet Take 50 mg by mouth at bedtime as needed for moderate pain.     Marland Kitchen trimethoprim (TRIMPEX) 100 MG tablet Take 1 tablet (100 mg total) by mouth at bedtime. 30 tablet 2  . Zoledronic Acid (RECLAST IV) Inject into the vein. Administered by Dr Carloyn Manner at [facility]  No current facility-administered medications for this visit.    Past Medical History  Diagnosis Date  . Hypertension   . Esophageal stricture     s/p dilation  . Osteoarthritis   . GERD (gastroesophageal reflux disease)   . Breast cyst     Right  . Dyslipidemia   . Aortic root dilatation (HCC)     Mild  . Diabetes mellitus without complication ([place])   . Hyperlipidemia   . Osteoporosis   . Cataract   . Macular pucker, left eye     Past Surgical History  Procedure Laterality Date  . Partial hysterectomy    . Total knee arthroplasty      Left  . Shoulder surgery      Right  . Knee arthroscopy  2004    Left and right  . Lumbar fusion  2000,2003  . Radial head arthroplasty  5/12    orif rt radial head  . Back surgery    . Abdominal hysterectomy     . Appendectomy    . Cholecystectomy  1993  . Cardiac catheterization  2010    both cataracts  . Fracture surgery      rt wrist2010  . Kyphosis surgery    . Hardware removal  12/10/2011    Procedure: HARDWARE REMOVAL;  Surgeon: Schuyler Amor, MD;  Location: [place];  Service: Orthopedics;  Laterality: Right;  radial head  . Eye surgery      cataracts  . Eye surgery      for macular pucker    ROS:  As stated in the HPI and negative for all other systems.  PHYSICAL EXAM BP 132/84 mmHg  Pulse 70  Ht 5\' 1"  (1.549 m)  Wt 165 lb 3 oz (74.929 kg)  BMI 31.23 kg/m2 GENERAL:  Well appearing HEENT:  Pupils equal round and reactive, fundi not visualized, oral mucosa unremarkable, dentures NECK:  No jugular venous distention, waveform within normal limits, carotid upstroke brisk and symmetric, no bruits, no thyromegaly LUNGS:  Clear to auscultation bilaterally BACK:  No CVA tenderness CHEST:  Unremarkable HEART:  PMI not displaced or sustained,S1 and S2 within normal limits, no S3, no S4, no clicks, no rubs, no murmurs ABD:  Flat, positive bowel sounds normal in frequency in pitch, no bruits, no rebound, no guarding, no midline pulsatile mass, no hepatomegaly, no splenomegaly EXT:  2 plus pulses throughout, no edema, no cyanosis no clubbing SKIN:  No rashes no nodules  EKG:      Sinus rhythm, rate 71, axis within normal limits, intervals within normal limits, no acute ST-T wave changes.  ASSESSMENT AND PLAN  CHEST PAIN:  Her pain is atypical. She's only had one episode recently. She had a negative stress perfusion study in 2015. At this point I don't think further cardiovascular testing is indicated.  She will be given sublingual nitroglycerin. She's to call me if she has any increasing episode of chest pain. I'll follow-up in a few months.  AORTIC ANEURYSM:  She had a mildly dilated aortic root years ago but this has not been seen in follow-up with her last CT in  2015. No further imaging is indicated.  HTN:  Her blood pressure is well controlled. She will continue the meds as listed.

## 2015-11-02 ENCOUNTER — Encounter: Payer: Self-pay | Admitting: Cardiology

## 2015-11-02 ENCOUNTER — Ambulatory Visit (INDEPENDENT_AMBULATORY_CARE_PROVIDER_SITE_OTHER): Payer: Medicare Other | Admitting: Cardiology

## 2015-11-02 VITALS — BP 132/84 | HR 70 | Ht 61.0 in | Wt 165.2 lb

## 2015-11-02 DIAGNOSIS — R072 Precordial pain: Secondary | ICD-10-CM | POA: Diagnosis not present

## 2015-11-02 MED ORDER — NITROGLYCERIN 0.4 MG SL SUBL: 0.4000 mg | SUBLINGUAL_TABLET | SUBLINGUAL | Status: DC | PRN

## 2015-11-02 NOTE — Patient Instructions (Addendum)
Dr Percival Spanish recommends that you continue on your current medications as directed. Please refer to the Current Medication list given to you today. A prescription for nitroglycerin has been sent to [facility] electronically.  Dr Percival Spanish recommends that you schedule a follow-up appointment in 3 months in [place].  If you need a refill on your cardiac medications before your next appointment, please call your pharmacy.

## 2015-11-05 NOTE — Therapy (Signed)
[facility] [place], [place], [address] Phone: [phone]   Fax:  [phone]  Physical Therapy Treatment  Patient Details  Name: Debra Phillips MRN: [id number] Date of Birth: Aug 23, 1929 Referring Provider: Dr. Laurance Flatten  Encounter Date: 07/05/2015    Past Medical History  Diagnosis Date  . Hypertension   . Esophageal stricture     s/p dilation  . Osteoarthritis   . GERD (gastroesophageal reflux disease)   . Breast cyst     Right  . Dyslipidemia   . Aortic root dilatation (HCC)     Mild  . Diabetes mellitus without complication ([place])   . Hyperlipidemia   . Osteoporosis   . Cataract   . Macular pucker, left eye     Past Surgical History  Procedure Laterality Date  . Partial hysterectomy    . Total knee arthroplasty      Left  . Shoulder surgery      Right  . Knee arthroscopy  2004    Left and right  . Lumbar fusion  2000,2003  . Radial head arthroplasty  5/12    orif rt radial head  . Back surgery    . Abdominal hysterectomy    . Appendectomy    . Cholecystectomy  1993  . Cardiac catheterization  2010    both cataracts  . Fracture surgery      rt wrist2010  . Kyphosis surgery    . Hardware removal  12/10/2011    Procedure: HARDWARE REMOVAL;  Surgeon: Schuyler Amor, MD;  Location: [place];  Service: Orthopedics;  Laterality: Right;  radial head  . Eye surgery      cataracts  . Eye surgery      for macular pucker    There were no vitals filed for this visit.  Visit Diagnosis:  Unsteadiness  Weakness generalized  Bilateral low back pain with left-sided sciatica  Bilateral low back pain with right-sided sciatica                                 PT Short Term Goals - 07/05/15 1512    PT SHORT TERM GOAL #1   Title I with initial HEP (06/11/15)   Time 2   Period Weeks   Status Achieved   PT SHORT TERM GOAL #2   Title patient to obtain RW  (06/11/15)   Time 2   Status Achieved           PT Long Term Goals - 07/05/15 1513    PT LONG TERM GOAL #1   Title I with advanced HEP   Time 6   Period Weeks   Status Achieved  Given 07/05/2015   PT LONG TERM GOAL #2   Title Pt to amb safely in clinic with least restrictive AD   Time 6   Period Weeks   Status Achieved   PT LONG TERM GOAL #3   Title improved BERG score to 46/56 or better to help reduce falls  48/56   Time 6   Period Weeks   Status Achieved   PT LONG TERM GOAL #4   Title improve 5 times sit to stand to 20 seconds  17 sec   Time 6   Period Weeks   Status Achieved   PT LONG TERM GOAL #5   Title demo 4/5 or better BLE strength to improve function.   Time  6   Status Partially Met   PT LONG TERM GOAL #6   Title decreased pain in back/hips to 4/10  or less with ADLs   Time 6   Period Weeks   Status Not Met  Reports increased pain with ADLs and activiites               Problem List Patient Active Problem List   Diagnosis Date Noted  . Osteoarthritis of lumbar spine 05/08/2015  . Spondylosis of lumbar region without myelopathy or radiculopathy 09/01/2014  . Diabetes ([place]) 04/26/2014  . Vitamin D deficiency 04/26/2014  . Chronic cystitis 04/26/2014  . Atrophic vaginitis 04/26/2014  . Solitary pulmonary nodule 03/20/2014  . Chest pain 03/18/2014  . Osteoporotic compression fracture of spine with routine healing 03/08/2014  . Precordial pain 02/16/2012  . PALPITATIONS 02/27/2010  . CAD 12/12/2009  . Aortic aneurysm of unspecified site without mention of rupture 06/27/2009  . DYSPNEA 06/27/2009  . DYSLIPIDEMIA 12/12/2008  . Essential hypertension 12/12/2008  . GERD 12/12/2008  . OSTEOARTHRITIS 12/12/2008   PHYSICAL THERAPY DISCHARGE SUMMARY  Visits from Start of Care: 12  Current functional level related to goals / functional outcomes: Please see above.   Remaining deficits: Overall very good progress but increased pain with  ADL's.   Education / Equipment: HEP.  Plan: Patient agrees to discharge.  Patient goals were partially met. Patient is being discharged due to being pleased with the current functional level.  ?????      Reuel Lamadrid, [place] MPT 11/05/2015, 5:26 PM  [facility] [address] [place], [place], [address] Phone: [phone]   Fax:  [phone]  Name: Debra Phillips MRN: [id number] Date of Birth: September 28, 1928

## 2015-11-08 ENCOUNTER — Other Ambulatory Visit: Payer: Self-pay | Admitting: *Deleted

## 2015-11-08 MED ORDER — GLUCOSE BLOOD VI STRP: ORAL_STRIP | Status: DC

## 2015-11-08 NOTE — Addendum Note (Signed)
Addended by: Therisa Doyne on: 11/08/2015 04:51 PM   Modules accepted: Orders

## 2015-12-25 DIAGNOSIS — N3941 Urge incontinence: Secondary | ICD-10-CM | POA: Diagnosis not present

## 2015-12-25 DIAGNOSIS — N390 Urinary tract infection, site not specified: Secondary | ICD-10-CM | POA: Diagnosis not present

## 2015-12-25 DIAGNOSIS — N8111 Cystocele, midline: Secondary | ICD-10-CM | POA: Diagnosis not present

## 2015-12-25 DIAGNOSIS — N952 Postmenopausal atrophic vaginitis: Secondary | ICD-10-CM | POA: Diagnosis not present

## 2015-12-25 DIAGNOSIS — Z0000 Encounter for general adult medical examination without abnormal findings: Secondary | ICD-10-CM | POA: Diagnosis not present

## 2015-12-25 DIAGNOSIS — N3020 Other chronic cystitis without hematuria: Secondary | ICD-10-CM | POA: Diagnosis not present

## 2016-01-14 ENCOUNTER — Ambulatory Visit (INDEPENDENT_AMBULATORY_CARE_PROVIDER_SITE_OTHER): Payer: Medicare Other | Admitting: Family Medicine

## 2016-01-14 ENCOUNTER — Encounter: Payer: Self-pay | Admitting: Family Medicine

## 2016-01-14 ENCOUNTER — Ambulatory Visit (INDEPENDENT_AMBULATORY_CARE_PROVIDER_SITE_OTHER): Payer: Medicare Other

## 2016-01-14 VITALS — BP 122/74 | HR 68 | Temp 97.0°F | Ht 61.0 in | Wt 172.2 lb

## 2016-01-14 DIAGNOSIS — M549 Dorsalgia, unspecified: Secondary | ICD-10-CM | POA: Diagnosis not present

## 2016-01-14 DIAGNOSIS — M542 Cervicalgia: Secondary | ICD-10-CM

## 2016-01-14 DIAGNOSIS — W57XXXA Bitten or stung by nonvenomous insect and other nonvenomous arthropods, initial encounter: Secondary | ICD-10-CM

## 2016-01-14 DIAGNOSIS — M47816 Spondylosis without myelopathy or radiculopathy, lumbar region: Secondary | ICD-10-CM | POA: Diagnosis not present

## 2016-01-14 DIAGNOSIS — R300 Dysuria: Secondary | ICD-10-CM | POA: Diagnosis not present

## 2016-01-14 DIAGNOSIS — M25511 Pain in right shoulder: Secondary | ICD-10-CM

## 2016-01-14 DIAGNOSIS — M25512 Pain in left shoulder: Secondary | ICD-10-CM

## 2016-01-14 DIAGNOSIS — R251 Tremor, unspecified: Secondary | ICD-10-CM | POA: Diagnosis not present

## 2016-01-14 DIAGNOSIS — I10 Essential (primary) hypertension: Secondary | ICD-10-CM

## 2016-01-14 DIAGNOSIS — I2510 Atherosclerotic heart disease of native coronary artery without angina pectoris: Secondary | ICD-10-CM | POA: Diagnosis not present

## 2016-01-14 DIAGNOSIS — R531 Weakness: Secondary | ICD-10-CM

## 2016-01-14 DIAGNOSIS — E119 Type 2 diabetes mellitus without complications: Secondary | ICD-10-CM | POA: Diagnosis not present

## 2016-01-14 DIAGNOSIS — T148 Other injury of unspecified body region: Secondary | ICD-10-CM

## 2016-01-14 DIAGNOSIS — E1169 Type 2 diabetes mellitus with other specified complication: Secondary | ICD-10-CM | POA: Diagnosis not present

## 2016-01-14 DIAGNOSIS — E559 Vitamin D deficiency, unspecified: Secondary | ICD-10-CM

## 2016-01-14 DIAGNOSIS — M4726 Other spondylosis with radiculopathy, lumbar region: Secondary | ICD-10-CM | POA: Diagnosis not present

## 2016-01-14 DIAGNOSIS — E785 Hyperlipidemia, unspecified: Secondary | ICD-10-CM

## 2016-01-14 LAB — BAYER DCA HB A1C WAIVED: HB A1C: 7.1 % — AB (ref <7.0)

## 2016-01-14 NOTE — Patient Instructions (Signed)
We will arrange for you to have an appointment with Dr. Verita Schneiders, the neurosurgeon to further evaluate your neck pain and headache. Please take up and the report from the radiologist when you go to see him We will also make arrangements for you to see the neurologist regarding the tremor He should follow-up with the urologist regarding your recurring urinary tract infections and his recommendations for this.

## 2016-01-14 NOTE — Progress Notes (Signed)
Subjective:    Patient ID: Debra Phillips, female    DOB: 1929/02/26, 82 y.o.   MRN: [id number]  HPI Patient is here today for her 4 month follow up of her chronic medical problems hypertension, hyperlipidemia and vit d deficiency. Patient is also complaining with back, neck, and shoulder pain. She also has been having bilateral leg and feet swelling. The patient recently saw the urologist 2 weeks ago for urinary tract infection. She will get lab work today. The patient comes to the visit today with multiple complaints including headache left shoulder pain neck pain and low back pain urinary tract infection fatigue and tiredness and tremor in the right hand which comes and goes. She denies any shortness of breath and is currently not having any trouble with heartburn indigestion nausea vomiting diarrhea or blood in the stool or black tarry bowel movements. She takes omeprazole for this. She is currently not taking her probiotic. She has her chronic urinary tract infections and her urologist's scheduling her to see someone else in his group it may help to deal with her bladder prolapse which may be causing her urinary tract infections. She is concerned about the tremor and the pain in her neck and back are very worrisome.   Review of Systems  Constitutional: Positive for fatigue.  HENT: Negative.   Eyes: Negative.   Respiratory: Negative.   Cardiovascular: Positive for leg swelling.  Gastrointestinal: Negative.   Endocrine: Negative.   Genitourinary: Negative.   Musculoskeletal: Positive for back pain and neck pain.       Shoulder Pain   Skin: Negative.   Allergic/Immunologic: Negative.   Neurological: Negative.   Hematological: Negative.   Psychiatric/Behavioral: Negative.         Patient Active Problem List   Diagnosis Date Noted  . Osteoarthritis of lumbar spine 05/08/2015  . Spondylosis of lumbar region without myelopathy or radiculopathy 09/01/2014  . Diabetes ([place])  04/26/2014  . Vitamin D deficiency 04/26/2014  . Chronic cystitis 04/26/2014  . Atrophic vaginitis 04/26/2014  . Solitary pulmonary nodule 03/20/2014  . Chest pain 03/18/2014  . Osteoporotic compression fracture of spine with routine healing 03/08/2014  . Precordial pain 02/16/2012  . PALPITATIONS 02/27/2010  . CAD 12/12/2009  . Aortic aneurysm of unspecified site without mention of rupture 06/27/2009  . DYSPNEA 06/27/2009  . DYSLIPIDEMIA 12/12/2008  . Essential hypertension 12/12/2008  . GERD 12/12/2008  . OSTEOARTHRITIS 12/12/2008   Outpatient Encounter Prescriptions as of 01/14/2016  Medication Sig  . aspirin 81 MG tablet Take 81 mg by mouth every evening.   . Calcium Carb-Cholecalciferol 600-500 MG-UNIT CAPS Take 1 capsule by mouth every morning.  . Cholecalciferol 1000 UNITS tablet Take 1,000 Units by mouth daily.   . Cinnamon 500 MG capsule Take 500 mg by mouth daily.   Marland Kitchen diltiazem (CARDIZEM CD) 180 MG 24 hr capsule Take 1 capsule (180 mg total) by mouth daily.  Marland Kitchen ESTRACE VAGINAL 0.1 MG/GM vaginal cream   . Flaxseed, Linseed, (FLAX SEED OIL PO) Take 1 tablet by mouth daily.  . fluticasone (FLONASE) 50 MCG/ACT nasal spray Place 2 sprays into both nostrils daily.  . furosemide (LASIX) 20 MG tablet Take 2 tablets (40 mg total) by mouth daily as needed for fluid or edema. Reported on 09/10/2015  . glucose blood (ONE TOUCH ULTRA TEST) test strip USE TO CHECK BLOOD GLUCOSE ONCE DAILY.Dx E11.9  . lisinopril-hydrochlorothiazide (PRINZIDE,ZESTORETIC) 20-12.5 MG tablet Take 1 tablet by mouth daily.  . metFORMIN (GLUCOPHAGE) 500  MG tablet Take 1 tablet (500 mg total) by mouth 2 (two) times daily.  . Multiple Vitamins-Minerals (ICAPS AREDS 2 PO) Take 1 capsule by mouth 2 (two) times daily.  . multivitamin (THERAGRAN) per tablet Take 1 tablet by mouth daily.    . nitroGLYCERIN (NITROSTAT) 0.4 MG SL tablet Place 1 tablet (0.4 mg total) under the tongue every 5 (five) minutes as needed for  chest pain.  . Omega-3 Fatty Acids (FISH OIL) 1000 MG CAPS Take 2,000 mg by mouth at bedtime.  Marland Kitchen omeprazole (PRILOSEC) 40 MG capsule Take 1 capsule (40 mg total) by mouth 2 (two) times daily.  [name] DELICA LANCETS 16W MISC USE TO CHECK BLOOD SUGAR ONCE DAILY  . Probiotic Product (PROBIOTIC DAILY PO) Take 1 capsule by mouth daily.  . traMADol (ULTRAM) 50 MG tablet Take 50 mg by mouth at bedtime as needed for moderate pain.   Marland Kitchen trimethoprim (TRIMPEX) 100 MG tablet Take 1 tablet (100 mg total) by mouth at bedtime.  . Zoledronic Acid (RECLAST IV) Inject into the vein. Administered by Dr Carloyn Manner at [facility]  . [DISCONTINUED] albuterol (PROVENTIL HFA;VENTOLIN HFA) 108 (90 Base) MCG/ACT inhaler Inhale 2 puffs into the lungs every 6 (six) hours as needed for wheezing or shortness of breath. (Patient not taking: Reported on 01/14/2016)  . [DISCONTINUED] ESTRING 2 MG vaginal ring Reported on 01/14/2016  . [DISCONTINUED] Spacer/Aero Chamber Mouthpiece MISC Please dispense one spacer for use with inhaler.   No facility-administered encounter medications on file as of 01/14/2016.       Objective:   Physical Exam  Constitutional: She is oriented to person, place, and time. She appears well-developed and well-nourished. She appears distressed.  Patient is very alert and just wants to feel better. Her family have been very supportive of her and trying to help her feel better. She is 82 years old and understands that she may have to live with a lot of these problems that she is complaining with today.  HENT:  Head: Normocephalic and atraumatic.  Right Ear: External ear normal.  Left Ear: External ear normal.  Nose: Nose normal.  Mouth/Throat: Oropharynx is clear and moist.  Eyes: Conjunctivae and EOM are normal. Pupils are equal, round, and reactive to light. Right eye exhibits no discharge. Left eye exhibits no discharge. No scleral icterus.  Neck: Normal range of motion. Neck supple. No  thyromegaly present.  Cardiovascular: Normal rate, regular rhythm, normal heart sounds and intact distal pulses.   No murmur heard. Heart is regular at 60/m  Pulmonary/Chest: Effort normal and breath sounds normal. No respiratory distress. She has no wheezes. She has no rales. She exhibits no tenderness.  Clear anteriorly and posteriorly  Abdominal: Soft. Bowel sounds are normal. She exhibits no mass. There is tenderness. There is no rebound and no guarding.  Generalized abdominal tenderness without masses or organ enlargement or bruits  Musculoskeletal: Normal range of motion. She exhibits no edema or tenderness.  The patient's range of motion is slow and limited secondary to her severe arthritis in her back.  Lymphadenopathy:    She has no cervical adenopathy.  Neurological: She is alert and oriented to person, place, and time. She has normal reflexes. No cranial nerve deficit.  She does have a slight tremor and her right hand. This was not persistent during the entire visit but was only there when she held her hands out to demonstrate the tremor.  Skin: Skin is warm and dry. No rash noted.  Psychiatric:  She has a normal mood and affect. Her behavior is normal. Judgment and thought content normal.  Nursing note and vitals reviewed.         Assessment & Plan:  1. Essential hypertension -The blood pressure is good today and the patient will continue with current treatment - CBC with Differential/Platelet - NMR, lipoprofile - Hepatic function panel  2. Type 2 diabetes mellitus without complication, without long-term current use of insulin ([place]) -The patient will continue with current treatment pending results of lab work - BMP8+EGFR - NMR, lipoprofile - Hepatic function panel - Bayer DCA Hb A1c Waived  3. Vitamin D deficiency -Continue with current treatment pending results of lab work  4. Dysuria -We will check a urine since it has been ordered but she should follow-up with  the urologist with any further infections - Urinalysis, Complete - Urine culture  5. Spondylosis of lumbar region without myelopathy or radiculopathy -Appointment with neurosurgery  6. Osteoarthritis of spine with radiculopathy, lumbar region -Appointment with neurosurgery  7. Type 2 diabetes mellitus with hyperlipidemia ([place]) -Continue current treatment and aggressive therapeutic lifestyle changes which include diet and exercise as tolerated  8. Atherosclerosis of native coronary artery of native heart without angina pectoris -Follow-up with cardiology  9. Weak - Thyroid Panel With TSH  10. Tremor - Ambulatory referral to Neurology  11. Back pain, unspecified location - Ambulatory referral to Neurosurgery - DG Cervical Spine Complete; Future  12. Neck pain - Ambulatory referral to Neurosurgery - DG Cervical Spine Complete; Future  13. Pain of both shoulder joints - Ambulatory referral to Neurosurgery - DG Cervical Spine Complete; Future  14. Tick bite - [place] spotted fvr abs pnl(IgG+IgM) - Lyme Ab/Western Blot Reflex  Patient Instructions  We will arrange for you to have an appointment with Dr. Verita Schneiders, the neurosurgeon to further evaluate your neck pain and headache. Please take up and the report from the radiologist when you go to see him We will also make arrangements for you to see the neurologist regarding the tremor He should follow-up with the urologist regarding your recurring urinary tract infections and his recommendations for this.   Arrie Senate MD

## 2016-01-15 ENCOUNTER — Telehealth: Payer: Self-pay | Admitting: Family Medicine

## 2016-01-15 LAB — THYROID PANEL WITH TSH
Free Thyroxine Index: 2.1 (ref 1.2–4.9)
T3 Uptake Ratio: 28 % (ref 24–39)
T4, Total: 7.5 ug/dL (ref 4.5–12.0)
TSH: 2 u[IU]/mL (ref 0.450–4.500)

## 2016-01-15 LAB — BMP8+EGFR
BUN / CREAT RATIO: 10 — AB (ref 12–28)
BUN: 7 mg/dL — ABNORMAL LOW (ref 8–27)
CALCIUM: 9.3 mg/dL (ref 8.7–10.3)
CO2: 23 mmol/L (ref 18–29)
Chloride: 96 mmol/L (ref 96–106)
Creatinine, Ser: 0.69 mg/dL (ref 0.57–1.00)
GFR, EST AFRICAN AMERICAN: 92 mL/min/{1.73_m2} (ref >59)
GFR, EST NON AFRICAN AMERICAN: 80 mL/min/{1.73_m2} (ref >59)
Glucose: 157 mg/dL — ABNORMAL HIGH (ref 65–99)
POTASSIUM: 4.7 mmol/L (ref 3.5–5.2)
SODIUM: 137 mmol/L (ref 134–144)

## 2016-01-15 LAB — CBC WITH DIFFERENTIAL/PLATELET
BASOS: 1 %
Basophils Absolute: 0 10*3/uL (ref 0.0–0.2)
EOS (ABSOLUTE): 0.2 10*3/uL (ref 0.0–0.4)
Eos: 3 %
HEMOGLOBIN: 12.8 g/dL (ref 11.1–15.9)
Hematocrit: 38 % (ref 34.0–46.6)
IMMATURE GRANS (ABS): 0 10*3/uL (ref 0.0–0.1)
Immature Granulocytes: 0 %
LYMPHS: 35 %
Lymphocytes Absolute: 2 10*3/uL (ref 0.7–3.1)
MCH: 30.8 pg (ref 26.6–33.0)
MCHC: 33.7 g/dL (ref 31.5–35.7)
MCV: 91 fL (ref 79–97)
Monocytes Absolute: 0.6 10*3/uL (ref 0.1–0.9)
Monocytes: 10 %
NEUTROS ABS: 3 10*3/uL (ref 1.4–7.0)
Neutrophils: 51 %
PLATELETS: 349 10*3/uL (ref 150–379)
RBC: 4.16 x10E6/uL (ref 3.77–5.28)
RDW: 13.2 % (ref 12.3–15.4)
WBC: 5.8 10*3/uL (ref 3.4–10.8)

## 2016-01-15 LAB — LYME AB/WESTERN BLOT REFLEX
LYME DISEASE AB, QUANT, IGM: <0.8 index (ref 0.00–0.79)
Lyme IgG/IgM Ab: <0.91 {ISR} (ref 0.00–0.90)

## 2016-01-15 LAB — HEPATIC FUNCTION PANEL
ALT: 15 IU/L (ref 0–32)
AST: 17 IU/L (ref 0–40)
Albumin: 4.1 g/dL (ref 3.5–4.7)
Alkaline Phosphatase: 58 IU/L (ref 39–117)
BILIRUBIN TOTAL: 0.4 mg/dL (ref 0.0–1.2)
BILIRUBIN, DIRECT: 0.11 mg/dL (ref 0.00–0.40)
Total Protein: 6.4 g/dL (ref 6.0–8.5)

## 2016-01-15 LAB — ROCKY MTN SPOTTED FVR ABS PNL(IGG+IGM)
RMSF IgG: NEGATIVE
RMSF IgM: 0.22 index (ref 0.00–0.89)

## 2016-01-15 NOTE — Telephone Encounter (Signed)
Pt aware that if she is having urine symptoms - Office Notes = dysuira.  She should come back in for ua

## 2016-01-16 ENCOUNTER — Other Ambulatory Visit: Payer: Medicare Other

## 2016-01-16 DIAGNOSIS — R300 Dysuria: Secondary | ICD-10-CM | POA: Diagnosis not present

## 2016-01-16 LAB — URINALYSIS, COMPLETE
Bilirubin, UA: NEGATIVE
Glucose, UA: NEGATIVE
KETONES UA: NEGATIVE
LEUKOCYTES UA: NEGATIVE
NITRITE UA: NEGATIVE
PH UA: 7.5 (ref 5.0–7.5)
Protein, UA: NEGATIVE
RBC UA: NEGATIVE
Specific Gravity, UA: 1.02 (ref 1.005–1.030)
Urobilinogen, Ur: 0.2 mg/dL (ref 0.2–1.0)

## 2016-01-16 LAB — MICROSCOPIC EXAMINATION
Bacteria, UA: NONE SEEN
RBC, UA: NONE SEEN /hpf (ref 0–?)
WBC UA: NONE SEEN /HPF (ref 0–?)

## 2016-01-16 LAB — NMR, LIPOPROFILE
CHOLESTEROL: 219 mg/dL — AB (ref 100–199)
HDL CHOLESTEROL BY NMR: 34 mg/dL — AB (ref >39)
HDL Particle Number: 26.8 umol/L — ABNORMAL LOW (ref >=30.5)
LDL PARTICLE NUMBER: 2031 nmol/L — AB (ref <1000)
LDL Size: 19.6 nm (ref >20.5)
LDL-C: 119 mg/dL — AB (ref 0–99)
LP-IR Score: 70 — ABNORMAL HIGH (ref <=45)
Small LDL Particle Number: 1273 nmol/L — ABNORMAL HIGH (ref <=527)
TRIGLYCERIDES BY NMR: 330 mg/dL — AB (ref 0–149)

## 2016-01-17 LAB — URINE CULTURE

## 2016-01-23 ENCOUNTER — Encounter: Payer: Self-pay | Admitting: Cardiology

## 2016-01-23 ENCOUNTER — Ambulatory Visit (INDEPENDENT_AMBULATORY_CARE_PROVIDER_SITE_OTHER): Payer: Medicare Other | Admitting: Cardiology

## 2016-01-23 VITALS — BP 120/82 | HR 92 | Ht 64.0 in | Wt 172.0 lb

## 2016-01-23 DIAGNOSIS — R072 Precordial pain: Secondary | ICD-10-CM

## 2016-01-23 DIAGNOSIS — I2510 Atherosclerotic heart disease of native coronary artery without angina pectoris: Secondary | ICD-10-CM

## 2016-01-23 MED ORDER — FUROSEMIDE 20 MG PO TABS: 20.0000 mg | ORAL_TABLET | Freq: Every day | ORAL | Status: DC | PRN

## 2016-01-23 NOTE — Progress Notes (Signed)
HPI The patient presents for followup of chest pain.  She has been seen for this previously. A stress perfusion study in 2015 was negative for any evidence of ischemia.  She was having pain when I saw her last week.  However, she is currently not having this.  The patient denies any new symptoms such as chest discomfort, neck or arm discomfort. There has been no new shortness of breath, PND or orthopnea. There have been no reported palpitations, presyncope or syncope.  She has had some increased lower extremity edema.   Allergies  Allergen Reactions  . Codeine Nausea And Vomiting  . Doxycycline Hives  . Other Nausea Only    Almost all antibiotics  . Sulfonamide Derivatives Hives  . Vancomycin Itching    Current Outpatient Prescriptions  Medication Sig Dispense Refill  . aspirin 81 MG tablet Take 81 mg by mouth every evening.     . Calcium Carb-Cholecalciferol 600-500 MG-UNIT CAPS Take 1 capsule by mouth every morning.    . Cholecalciferol 1000 UNITS tablet Take 1,000 Units by mouth daily.     . Cinnamon 500 MG capsule Take 500 mg by mouth daily.     Marland Kitchen diltiazem (CARDIZEM CD) 180 MG 24 hr capsule Take 1 capsule (180 mg total) by mouth daily. 90 capsule 1  . ESTRACE VAGINAL 0.1 MG/GM vaginal cream Place 1 Applicatorful vaginally 3 (three) times a week.     . Flaxseed, Linseed, (FLAX SEED OIL PO) Take 1 tablet by mouth daily.    Marland Kitchen lisinopril-hydrochlorothiazide (PRINZIDE,ZESTORETIC) 20-12.5 MG tablet Take 1 tablet by mouth daily. 30 tablet 3  . metFORMIN (GLUCOPHAGE) 500 MG tablet Take 1 tablet (500 mg total) by mouth 2 (two) times daily. 60 tablet 3  . Multiple Vitamins-Minerals (ICAPS AREDS 2 PO) Take 1 capsule by mouth 2 (two) times daily.    . multivitamin (THERAGRAN) per tablet Take 1 tablet by mouth daily.      . nitroGLYCERIN (NITROSTAT) 0.4 MG SL tablet Place 1 tablet (0.4 mg total) under the tongue every 5 (five) minutes as needed for chest pain. 25 tablet 3  . Omega-3 Fatty  Acids (FISH OIL) 1000 MG CAPS Take 2,000 mg by mouth at bedtime.    Marland Kitchen omeprazole (PRILOSEC) 40 MG capsule Take 1 capsule (40 mg total) by mouth 2 (two) times daily. 60 capsule 5  . Probiotic Product (PROBIOTIC DAILY PO) Take 1 capsule by mouth daily.    . traMADol (ULTRAM) 50 MG tablet Take 50 mg by mouth at bedtime as needed for moderate pain.     . furosemide (LASIX) 20 MG tablet Take 2 tablets (40 mg total) by mouth daily as needed for fluid or edema. Reported on 09/10/2015 (Patient not taking: Reported on 01/23/2016) 30 tablet 2  . glucose blood (ONE TOUCH ULTRA TEST) test strip USE TO CHECK BLOOD GLUCOSE ONCE DAILY.Dx E11.9 50 each 11  . ONETOUCH DELICA LANCETS 99991111 MISC USE TO CHECK BLOOD SUGAR ONCE DAILY 100 each 2  . Zoledronic Acid (RECLAST IV) Inject into the vein. Administered by Dr [facility] at [facility]     No current facility-administered medications for this visit.    Past Medical History  Diagnosis Date  . Hypertension   . Esophageal stricture     s/p dilation  . Osteoarthritis   . GERD (gastroesophageal reflux disease)   . Breast cyst     Right  . Dyslipidemia   . Aortic root dilatation (HCC)  Mild  . Diabetes mellitus without complication ([place])   . Hyperlipidemia   . Osteoporosis   . Cataract   . Macular pucker, left eye     Past Surgical History  Procedure Laterality Date  . Partial hysterectomy    . Total knee arthroplasty      Left  . Shoulder surgery      Right  . Knee arthroscopy  2004    Left and right  . Lumbar fusion  2000,2003  . Radial head arthroplasty  5/12    orif rt radial head  . Back surgery    . Abdominal hysterectomy    . Appendectomy    . Cholecystectomy  1993  . Cardiac catheterization  2010    both cataracts  . Fracture surgery      rt wrist2010  . Kyphosis surgery    . Hardware removal  12/10/2011    Procedure: HARDWARE REMOVAL;  Surgeon: Schuyler Amor, MD;  Location: [place];  Service: Orthopedics;   Laterality: Right;  radial head  . Eye surgery      cataracts  . Eye surgery      for macular pucker    ROS:  Back, neck and shoulder pain, tremors, decreased balance, macular degeneration.  Otherwise as stated in the HPI and negative for all other systems.  PHYSICAL EXAM BP 120/82 mmHg  Pulse 92  Ht 5\' 4"  (1.626 m)  Wt 172 lb (78.019 kg)  BMI 29.51 kg/m2 GENERAL:  Well appearing HEENT:  Pupils equal round and reactive, fundi not visualized, oral mucosa unremarkable, dentures NECK:  No jugular venous distention, waveform within normal limits, carotid upstroke brisk and symmetric, no bruits, no thyromegaly LUNGS:  Clear to auscultation bilaterally CHEST:  Unremarkable HEART:  PMI not displaced or sustained,S1 and S2 within normal limits, no S3, no S4, no clicks, no rubs, no murmurs ABD:  Flat, positive bowel sounds normal in frequency in pitch, no bruits, no rebound, no guarding, no midline pulsatile mass, no hepatomegaly, no splenomegaly EXT:  2 plus pulses throughout, mild edema, no cyanosis no clubbing    ASSESSMENT AND PLAN  CHEST PAIN:  She is no longer complaining of this.  No change in therapy is indicated.  AORTIC ANEURYSM:  She had a mildly dilated aortic root years ago but this has not been seen in follow-up with her last CT in 2015.  No further imaging is indicated.  HTN:  Her blood pressure is well controlled. She will continue the meds as listed.  EDEMA:  She drinks a lot of fluid secondary to frequent UTIs.  She is going to take her Lasix for a few days and try to keep her feet up.

## 2016-01-23 NOTE — Patient Instructions (Signed)
Medication Instructions:  You may take your Furosemide as needed for swelling. Continue all other medications as listed.  Follow-Up: Follow up in 6 months with Dr. Percival Spanish.  You will receive a letter in the mail 2 months before you are due.  Please call us when you receive this letter to schedule your follow up appointment.  If you need a refill on your cardiac medications before your next appointment, please call your pharmacy.  Thank you for choosing [facility]!!

## 2016-01-29 ENCOUNTER — Encounter: Payer: Self-pay | Admitting: Neurology

## 2016-01-29 ENCOUNTER — Ambulatory Visit (INDEPENDENT_AMBULATORY_CARE_PROVIDER_SITE_OTHER): Payer: Medicare Other | Admitting: Neurology

## 2016-01-29 VITALS — BP 132/84 | HR 72 | Ht 64.0 in | Wt 173.0 lb

## 2016-01-29 DIAGNOSIS — R269 Unspecified abnormalities of gait and mobility: Secondary | ICD-10-CM

## 2016-01-29 DIAGNOSIS — R251 Tremor, unspecified: Secondary | ICD-10-CM | POA: Diagnosis not present

## 2016-01-29 DIAGNOSIS — I2510 Atherosclerotic heart disease of native coronary artery without angina pectoris: Secondary | ICD-10-CM

## 2016-01-29 HISTORY — DX: Unspecified abnormalities of gait and mobility: R26.9

## 2016-01-29 MED ORDER — PROPRANOLOL HCL 10 MG PO TABS
10.0000 mg | ORAL_TABLET | Freq: Two times a day (BID) | ORAL | Status: DC
Start: 2016-01-29 — End: 2016-01-29

## 2016-01-29 NOTE — Patient Instructions (Signed)
Tremor °A tremor is trembling or shaking that you cannot control. Most tremors affect the hands or arms. Tremors can also affect the head, vocal cords, face, and other parts of the body. There are many types of tremors. Common types include:  °· Essential tremor. These usually occur in people over the age of 40. It may run in families and can happen in otherwise healthy people.   °· Resting tremor. These occur when the muscles are at rest, such as when your hands are resting in your lap. People with Parkinson disease often have resting tremors.   °· Postural tremor. These occur when you try to hold a pose, such as keeping your hands outstretched.   °· Kinetic tremor. These occur during purposeful movement, such as trying to touch a finger to your nose.   °· Task-specific tremor. These may occur when you perform tasks such as handwriting, speaking, or standing.   °· Psychogenic tremor. These dramatically lessen or disappear when you are distracted. They can happen in people of all ages.   °Some types of tremors have no known cause. Tremors can also be a symptom of nervous system problems (neurological disorders) that may occur with aging. Some tremors go away with treatment while others do not.  °HOME CARE INSTRUCTIONS °Watch your tremor for any changes. The following actions may help to lessen any discomfort you are feeling: °· Take medicines only as directed by your health care provider.   °· Limit alcohol intake to no more than 1 drink per day for nonpregnant women and 2 drinks per day for men. One drink equals 12 oz of beer, 5 oz of wine, or 1½ oz of hard liquor. °· Do not use any tobacco products, including cigarettes, chewing tobacco, or electronic cigarettes. If you need help quitting, ask your health care provider.   °· Avoid extreme heat or cold.    °· Limit the amount of caffeine you consume as directed by your health care provider.   °· Try to get 8 hours of sleep each night. °· Find ways to manage your  stress, such as meditation or yoga. °· Keep all follow-up visits as directed by your health care provider. This is important. °SEEK MEDICAL CARE IF: °· You start having a tremor after starting a new medicine. °· You have tremor with other symptoms such as: °¨ Numbness. °¨ Tingling. °¨ Pain. °¨ Weakness. °· Your tremor gets worse. °· Your tremor interferes with your day-to-day life. °  °This information is not intended to replace advice given to you by your health care provider. Make sure you discuss any questions you have with your health care provider. °  °Document Released: 08/01/2002 Document Revised: 09/01/2014 Document Reviewed: 02/06/2014 °Elsevier Interactive Patient Education ©2016 [employer] ° °

## 2016-01-29 NOTE — Progress Notes (Signed)
Reason for visit: Tremor  Referring physician: Dr. Eliezer Lofts Debra Phillips is a 82 y.o. female  History of present illness:  Ms. Tuccio is an 82 year old right-handed white female with a history of tremor that dates back about 4 years prior to this evaluation. The patient indicates that the tremor affects primarily the right arm and has gradually gotten worse over time. The patient indicates that when she is upset about something the tremor may worsen. She reports a prominent family history of tremor with her father and one of her brothers. The patient has noted some change in handwriting, but she indicates that when she presses down with a pen the handwriting seems to improve. The patient occasionally might note some slight tremor of one of her lips. She has no head tremor or vocal tremor. She does have some mild gait instability, she will fall on occasion. She has been told that she needs to use a cane or walker to get around outside the house but she does not do this. She denies any definite weakness of the arms or legs, but she does have chronic low back pain and some neck discomfort. She reports a mild memory issue. She has some urinary incontinence and frequent urinary tract infections, currently followed by urology. She denies any numbness of the extremities. She is sent to this office for further evaluation.  Past Medical History  Diagnosis Date  . Hypertension   . Esophageal stricture     s/p dilation  . Osteoarthritis   . GERD (gastroesophageal reflux disease)   . Breast cyst     Right  . Dyslipidemia   . Aortic root dilatation (HCC)     Mild  . Diabetes mellitus without complication ([place])   . Hyperlipidemia   . Osteoporosis   . Cataract   . Macular pucker, left eye   . Abnormality of gait 01/29/2016    Past Surgical History  Procedure Laterality Date  . Partial hysterectomy    . Total knee arthroplasty      Left  . Shoulder surgery      Right  . Knee arthroscopy   2004    Left and right  . Lumbar fusion  2000,2003  . Radial head arthroplasty  5/12    orif rt radial head  . Back surgery    . Abdominal hysterectomy    . Appendectomy    . Cholecystectomy  1993  . Cardiac catheterization  2010    both cataracts  . Fracture surgery      rt wrist2010  . Kyphosis surgery    . Hardware removal  12/10/2011    Procedure: HARDWARE REMOVAL;  Surgeon: Schuyler Amor, MD;  Location: [place];  Service: Orthopedics;  Laterality: Right;  radial head  . Eye surgery      cataracts  . Eye surgery      for macular pucker    Family History  Problem Relation Age of Onset  . Pneumonia Mother   . Brain cancer Father   . Tremor Father   . Arthritis Sister   . COPD Sister   . Emphysema Brother   . COPD Brother   . Tremor Brother   . Lung cancer Brother   . Heart disease Brother   . Liver cancer Brother   . Diabetes Other      1 child has diabetes at age 70 and the other 16 are healthy    Social history:  reports  that she has never smoked. She has never used smokeless tobacco. She reports that she does not drink alcohol or use illicit drugs.  Medications:  Prior to Admission medications   Medication Sig Start Date End Date Taking? Authorizing Provider  aspirin 81 MG tablet Take 81 mg by mouth every evening.     Historical Provider, MD  Calcium Carb-Cholecalciferol 600-500 MG-UNIT CAPS Take 1 capsule by mouth every morning.    Historical Provider, MD  Cholecalciferol 1000 UNITS tablet Take 1,000 Units by mouth daily.     Historical Provider, MD  Cinnamon 500 MG capsule Take 500 mg by mouth daily.     Historical Provider, MD  diltiazem (CARDIZEM CD) 180 MG 24 hr capsule Take 1 capsule (180 mg total) by mouth daily. 10/24/15   Chipper Herb, MD  ESTRACE VAGINAL 0.1 MG/GM vaginal cream Place 1 Applicatorful vaginally 3 (three) times a week.  12/25/15   Historical Provider, MD  Flaxseed, Linseed, (FLAX SEED OIL PO) Take 1 tablet by mouth  daily.    Historical Provider, MD  furosemide (LASIX) 20 MG tablet Take 1 tablet (20 mg total) by mouth daily as needed for fluid or edema. Reported on 09/10/2015 01/23/16   Minus Breeding, MD  glucose blood (ONE TOUCH ULTRA TEST) test strip USE TO CHECK BLOOD GLUCOSE ONCE DAILY.Dx E11.9 11/08/15   Chipper Herb, MD  lisinopril-hydrochlorothiazide (PRINZIDE,ZESTORETIC) 20-12.5 MG tablet Take 1 tablet by mouth daily. 10/24/15   Chipper Herb, MD  metFORMIN (GLUCOPHAGE) 500 MG tablet Take 1 tablet (500 mg total) by mouth 2 (two) times daily. 10/24/15   Chipper Herb, MD  Multiple Vitamins-Minerals (ICAPS AREDS 2 PO) Take 1 capsule by mouth 2 (two) times daily.    Historical Provider, MD  multivitamin [facility]) per tablet Take 1 tablet by mouth daily.      Historical Provider, MD  nitroGLYCERIN (NITROSTAT) 0.4 MG SL tablet Place 1 tablet (0.4 mg total) under the tongue every 5 (five) minutes as needed for chest pain. 11/02/15   Minus Breeding, MD  Omega-3 Fatty Acids (FISH OIL) 1000 MG CAPS Take 2,000 mg by mouth at bedtime.    Historical Provider, MD  omeprazole (PRILOSEC) 40 MG capsule Take 1 capsule (40 mg total) by mouth 2 (two) times daily. 10/24/15   Chipper Herb, MD  [facility] DELICA LANCETS 99991111 MISC USE TO CHECK BLOOD SUGAR ONCE DAILY 05/08/15   Chipper Herb, MD  Probiotic Product (PROBIOTIC DAILY PO) Take 1 capsule by mouth daily.    Historical Provider, MD  traMADol (ULTRAM) 50 MG tablet Take 50 mg by mouth at bedtime as needed for moderate pain.     Historical Provider, MD  Zoledronic Acid (RECLAST IV) Inject into the vein. Administered by Dr Carloyn Manner at [facility] Provider, MD      Allergies  Allergen Reactions  . Codeine Nausea And Vomiting  . Doxycycline Hives  . Other Nausea Only    Almost all antibiotics  . Sulfonamide Derivatives Hives  . Vancomycin Itching    ROS:  Out of a complete 14 system review of symptoms, the patient complains only of the following  symptoms, and all other reviewed systems are negative.  Swelling in the legs Blurred vision Tremor Memory disturbance  Blood pressure 132/84, pulse 72, height 5\' 4"  (1.626 m), weight 173 lb (78.472 kg).  Physical Exam  General: The patient is alert and cooperative at the time of the examination.  Eyes: Pupils  are equal, round, and reactive to light. Discs are flat bilaterally.  Neck: The neck is supple, no carotid bruits are noted.  Respiratory: The respiratory examination is clear.  Cardiovascular: The cardiovascular examination reveals a regular rate and rhythm, no obvious murmurs or rubs are noted.  Skin: Extremities are with 3+ edema below the knees bilaterally.  Neurologic Exam  Mental status: The patient is alert and oriented x 3 at the time of the examination. The patient has apparent normal recent and remote memory, with an apparently normal attention span and concentration ability.  Cranial nerves: Facial symmetry is present. There is good sensation of the face to pinprick and soft touch bilaterally. The strength of the facial muscles and the muscles to head turning and shoulder shrug are normal bilaterally. Speech is well enunciated, no aphasia or dysarthria is noted. Extraocular movements are full. Visual fields are full. The tongue is midline, and the patient has symmetric elevation of the soft palate. No obvious hearing deficits are noted. Mild masking of the face is seen.  Motor: The motor testing reveals 5 over 5 strength of all 4 extremities. Good symmetric motor tone is noted throughout.  Sensory: Sensory testing is intact to pinprick, soft touch, vibration sensation, and position sense on all 4 extremities. No evidence of extinction is noted.  Coordination: Cerebellar testing reveals good finger-nose-finger and heel-to-shin bilaterally.  Gait and station: The patient is able to arise from a seated position with arms crossed. Once up, she has a stooped posture,  slight decrease in arm swing on the right, tremors seen while walking on the right. Tandem gait was not attempted. Romberg is negative, but is unsteady. No drift is seen.  Reflexes: Deep tendon reflexes are symmetric, but are depressed bilaterally. Toes are downgoing bilaterally.   Assessment/Plan:  1. Right upper extremity tremor  2. Gait instability  3. Reported mild memory disorder  The patient appears to have features of parkinsonism with a stooped posture, masking of the face, unilateral tremor that is primarily a resting tremor. The patient however has had a long duration of the tremor, and she has a family history of tremor, and on the clinical examination the tremor translates into the handwriting. There appears to be a blend between parkinsonism and an essential tremor. 5% of the individuals with essential tremor may have unilateral tremor. CT of the head will be done, the patient will be followed over time clinically, she will follow-up in 5 months.  Jill Alexanders MD 01/29/2016 2:17 PM  [facility] [address] [place], [place] [address]  Phone [phone] Fax [phone]

## 2016-02-06 ENCOUNTER — Ambulatory Visit
Admission: RE | Admit: 2016-02-06 | Discharge: 2016-02-06 | Disposition: A | Discharge status: Home / Self Care | Payer: Medicare Other | Source: Ambulatory Visit | Attending: Neurology | Admitting: Neurology

## 2016-02-06 DIAGNOSIS — R251 Tremor, unspecified: Secondary | ICD-10-CM

## 2016-02-10 ENCOUNTER — Telehealth: Payer: Self-pay | Admitting: Neurology

## 2016-02-10 NOTE — Telephone Encounter (Signed)
I called patient. CT the head showed no change from 2014, we will follow the tremor over time, the patient will be seen in about 5 months.   CT head 02/07/16:  IMPRESSION:  Equivocal CT head (without) demonstrating: 1. Mild perisylvian atrophy. 2. No acute findings. 3. No significant change from CT on 09/26/12.

## 2016-02-20 DIAGNOSIS — M4806 Spinal stenosis, lumbar region: Secondary | ICD-10-CM | POA: Diagnosis not present

## 2016-02-20 DIAGNOSIS — M7060 Trochanteric bursitis, unspecified hip: Secondary | ICD-10-CM | POA: Diagnosis not present

## 2016-02-20 DIAGNOSIS — M1990 Unspecified osteoarthritis, unspecified site: Secondary | ICD-10-CM | POA: Diagnosis not present

## 2016-02-21 ENCOUNTER — Other Ambulatory Visit: Payer: Self-pay | Admitting: Family Medicine

## 2016-03-04 ENCOUNTER — Ambulatory Visit (INDEPENDENT_AMBULATORY_CARE_PROVIDER_SITE_OTHER): Payer: Medicare Other | Admitting: Family Medicine

## 2016-03-04 ENCOUNTER — Ambulatory Visit (INDEPENDENT_AMBULATORY_CARE_PROVIDER_SITE_OTHER): Payer: Medicare Other

## 2016-03-04 ENCOUNTER — Encounter: Payer: Self-pay | Admitting: Family Medicine

## 2016-03-04 VITALS — BP 125/74 | HR 76 | Temp 97.4°F | Ht 61.0 in | Wt 169.6 lb

## 2016-03-04 DIAGNOSIS — M25552 Pain in left hip: Secondary | ICD-10-CM

## 2016-03-04 DIAGNOSIS — I2510 Atherosclerotic heart disease of native coronary artery without angina pectoris: Secondary | ICD-10-CM | POA: Diagnosis not present

## 2016-03-04 NOTE — Progress Notes (Signed)
Subjective:    Patient ID: Debra Phillips, female    DOB: 08-Nov-1928, 82 y.o.   MRN: [id number]  HPI Patient is here today for left hip pain x 2 weeks. There is been no injury. She had a history of previous back surgery. The pain starts in her hip and runs down to her foot by history. There is no paresthesias numbness or tingling.  Review of Systems  Constitutional: Negative.   HENT: Negative.   Eyes: Negative.   Respiratory: Negative.   Cardiovascular: Negative.   Gastrointestinal: Negative.   Endocrine: Negative.   Genitourinary: Negative.   Musculoskeletal:       Left hip pain   Skin: Negative.   Allergic/Immunologic: Negative.   Neurological: Negative.   Hematological: Negative.   Psychiatric/Behavioral: Negative.        Depression screen [facility] 2/9 03/04/2016 10/24/2015 08/30/2015 06/15/2015 05/08/2015  Decreased Interest 0 0 0 0 0  Down, Depressed, Hopeless 0 1 1 1  0  PHQ - 2 Score 0 1 1 1  0  Altered sleeping - - - - -  Tired, decreased energy - - - - -  Change in appetite - - - - -  Feeling bad or failure about yourself  - - - - -  Trouble concentrating - - - - -  Moving slowly or fidgety/restless - - - - -  Suicidal thoughts - - - - -  PHQ-9 Score - - - - -   Patient Active Problem List   Diagnosis Date Noted  . Tremor 01/29/2016  . Abnormality of gait 01/29/2016  . Type 2 diabetes mellitus with hyperlipidemia ([place]) 01/14/2016  . Osteoarthritis of lumbar spine 05/08/2015  . Spondylosis of lumbar region without myelopathy or radiculopathy 09/01/2014  . Diabetes type 2, controlled ([place]) 04/26/2014  . Vitamin D deficiency 04/26/2014  . Chronic cystitis 04/26/2014  . Atrophic vaginitis 04/26/2014  . Solitary pulmonary nodule 03/20/2014  . Chest pain 03/18/2014  . Osteoporotic compression fracture of spine with routine healing 03/08/2014  . Precordial pain 02/16/2012  . PALPITATIONS 02/27/2010  . Coronary atherosclerosis 12/12/2009  . Aortic aneurysm of  unspecified site without mention of rupture 06/27/2009  . DYSPNEA 06/27/2009  . DYSLIPIDEMIA 12/12/2008  . Essential hypertension 12/12/2008  . GERD 12/12/2008  . OSTEOARTHRITIS 12/12/2008   Outpatient Encounter Prescriptions as of 03/04/2016  Medication Sig  . aspirin 81 MG tablet Take 81 mg by mouth every evening.   . Calcium Carb-Cholecalciferol 600-500 MG-UNIT CAPS Take 1 capsule by mouth every morning.  . Cholecalciferol 1000 UNITS tablet Take 1,000 Units by mouth daily.   . Cinnamon 500 MG capsule Take 500 mg by mouth daily.   Marland Kitchen diltiazem (CARDIZEM CD) 180 MG 24 hr capsule Take 1 capsule (180 mg total) by mouth daily.  Marland Kitchen ESTRACE VAGINAL 0.1 MG/GM vaginal cream Place 1 Applicatorful vaginally 3 (three) times a week.   . Flaxseed, Linseed, (FLAX SEED OIL PO) Take 1 tablet by mouth daily.  . furosemide (LASIX) 20 MG tablet Take 1 tablet (20 mg total) by mouth daily as needed for fluid or edema. Reported on 09/10/2015  . glucose blood (ONE TOUCH ULTRA TEST) test strip USE TO CHECK BLOOD GLUCOSE ONCE DAILY.Dx E11.9  . lisinopril-hydrochlorothiazide (PRINZIDE,ZESTORETIC) 20-12.5 MG tablet Take 1 tablet by mouth daily.  . metFORMIN (GLUCOPHAGE) 500 MG tablet Take 1 tablet (500 mg total) by mouth 2 (two) times daily.  . Multiple Vitamins-Minerals (ICAPS AREDS 2 PO) Take 1 capsule by mouth 2 (  two) times daily.  . multivitamin (THERAGRAN) per tablet Take 1 tablet by mouth daily.    . nitroGLYCERIN (NITROSTAT) 0.4 MG SL tablet Place 1 tablet (0.4 mg total) under the tongue every 5 (five) minutes as needed for chest pain.  . Omega-3 Fatty Acids (FISH OIL) 1000 MG CAPS Take 2,000 mg by mouth at bedtime.  Marland Kitchen omeprazole (PRILOSEC) 40 MG capsule Take 1 capsule (40 mg total) by mouth 2 (two) times daily.  [name] DELICA LANCETS 99991111 MISC USE TO CHECK BLOOD SUGAR ONCE DAILY  . Probiotic Product (PROBIOTIC DAILY PO) Take 1 capsule by mouth daily.  . traMADol (ULTRAM) 50 MG tablet Take 50 mg by mouth at  bedtime as needed for moderate pain.   . Zoledronic Acid (RECLAST IV) Inject into the vein. Administered by Dr Carloyn Manner at [facility]   No facility-administered encounter medications on file as of 03/04/2016.    Objective:   Physical Exam  Constitutional: She appears well-developed and well-nourished.  Musculoskeletal:  Hip: There is tenderness around and over the greater trochanter. Reflexes are difficult to elicit since she has had bilateral knee replacements X-ray shows amazingly good and preserved architecture in both hips Greater trochanter was injected with Depo-Medrol and Marcaine hoping this will treat her symptoms. If pain is more related to disc disease in her back this injection will not be likely to help. I have explained this to her.    BP 125/74 mmHg  Pulse 76  Temp(Src) 97.4 F (36.3 C) (Oral)  Ht 5\' 1"  (1.549 m)  Wt 169 lb 9.6 oz (76.93 kg)  BMI 32.06 kg/m2       Assessment & Plan:  1. Left hip pain Trochanteric bursitis versus disc disease. Injected as described above - DG HIP UNILAT W OR W/O PELVIS 2-3 VIEWS LEFT; Future  Wardell Honour MD

## 2016-03-31 DIAGNOSIS — N952 Postmenopausal atrophic vaginitis: Secondary | ICD-10-CM | POA: Diagnosis not present

## 2016-03-31 DIAGNOSIS — N819 Female genital prolapse, unspecified: Secondary | ICD-10-CM | POA: Diagnosis not present

## 2016-04-04 ENCOUNTER — Other Ambulatory Visit: Payer: Medicare Other

## 2016-04-04 ENCOUNTER — Telehealth: Payer: Self-pay | Admitting: Family Medicine

## 2016-04-04 DIAGNOSIS — R399 Unspecified symptoms and signs involving the genitourinary system: Secondary | ICD-10-CM | POA: Diagnosis not present

## 2016-04-04 LAB — URINALYSIS, COMPLETE
BILIRUBIN UA: NEGATIVE
GLUCOSE, UA: NEGATIVE
Ketones, UA: NEGATIVE
Nitrite, UA: POSITIVE — AB
PH UA: 7 (ref 5.0–7.5)
PROTEIN UA: NEGATIVE
RBC, UA: NEGATIVE
Specific Gravity, UA: 1.015 (ref 1.005–1.030)
UUROB: 0.2 mg/dL (ref 0.2–1.0)

## 2016-04-04 LAB — MICROSCOPIC EXAMINATION: RBC MICROSCOPIC, UA: NONE SEEN /HPF (ref 0–?)

## 2016-04-04 MED ORDER — CIPROFLOXACIN HCL 500 MG PO TABS: 500.0000 mg | ORAL_TABLET | Freq: Two times a day (BID) | ORAL | 0 refills | Status: DC

## 2016-04-04 NOTE — Telephone Encounter (Signed)
Is this ok for her to do or do yall want her to be seen? Please advise

## 2016-04-04 NOTE — Telephone Encounter (Signed)
Pt came by and left a spec.

## 2016-05-16 ENCOUNTER — Ambulatory Visit (INDEPENDENT_AMBULATORY_CARE_PROVIDER_SITE_OTHER): Payer: Medicare Other | Admitting: Family Medicine

## 2016-05-16 ENCOUNTER — Encounter: Payer: Self-pay | Admitting: Family Medicine

## 2016-05-16 VITALS — BP 138/76 | HR 59 | Temp 97.0°F | Ht 61.0 in | Wt 171.2 lb

## 2016-05-16 DIAGNOSIS — R600 Localized edema: Secondary | ICD-10-CM | POA: Diagnosis not present

## 2016-05-16 DIAGNOSIS — I2510 Atherosclerotic heart disease of native coronary artery without angina pectoris: Secondary | ICD-10-CM | POA: Diagnosis not present

## 2016-05-16 DIAGNOSIS — E119 Type 2 diabetes mellitus without complications: Secondary | ICD-10-CM

## 2016-05-16 DIAGNOSIS — N3941 Urge incontinence: Secondary | ICD-10-CM | POA: Diagnosis not present

## 2016-05-16 MED ORDER — POTASSIUM CHLORIDE CRYS ER 10 MEQ PO TBCR: 10.0000 meq | EXTENDED_RELEASE_TABLET | Freq: Every day | ORAL | 0 refills | Status: DC

## 2016-05-16 MED ORDER — FUROSEMIDE 20 MG PO TABS: ORAL_TABLET | ORAL | 0 refills | Status: DC

## 2016-05-16 NOTE — Progress Notes (Signed)
Subjective:  Patient ID: Debra Phillips, female    DOB: 03/19/1929  Age: 82 y.o. MRN: [id number]  CC: Leg Swelling (pt here today for swelling in both legs and it's painful)   HPI Debra Phillips presents for Increased swelling in the legs over the last week or 2. She's been having a little bit of shortness of breath intermittently. She has problems with incontinence but no known kidney issues. Her blood sugar has been stable lately. She is a known diabetic but 4 months ago had an excellent A1c. She states that she has had stasis cellulitis in the past. She says this is very uncomfortable currently. Symptoms have been getting worse ever since onset 2 weeks ago.   History Debra Phillips has a past medical history of Abnormality of gait (01/29/2016); Aortic root dilatation ([place]); Breast cyst; Cataract; Diabetes mellitus without complication ([place]); Dyslipidemia; Esophageal stricture; GERD (gastroesophageal reflux disease); Hyperlipidemia; Hypertension; Macular pucker, left eye; Osteoarthritis; and Osteoporosis.   She has a past surgical history that includes Partial hysterectomy; Total knee arthroplasty; Shoulder surgery; Knee arthroscopy (2004); Lumbar fusion (2000,2003); Radial head arthroplasty (5/12); Back surgery; Abdominal hysterectomy; Appendectomy; Cholecystectomy (1993); Cardiac catheterization (2010); Fracture surgery; Kyphosis surgery; Hardware Removal (12/10/2011); Eye surgery; and Eye surgery.   Her family history includes Arthritis in her sister; Brain cancer in her father; COPD in her brother and sister; Diabetes in her other; Emphysema in her brother; Heart disease in her brother; Liver cancer in her brother; Lung cancer in her brother; Pneumonia in her mother; Tremor in her brother and father.She reports that she has never smoked. She has never used smokeless tobacco. She reports that she does not drink alcohol or use drugs.    ROS Review of Systems  Constitutional: Negative for  activity change, appetite change and fever.  HENT: Negative for congestion, rhinorrhea and sore throat.   Eyes: Negative for visual disturbance.  Respiratory: Positive for shortness of breath. Negative for cough.   Cardiovascular: Positive for leg swelling. Negative for chest pain and palpitations.  Gastrointestinal: Negative for abdominal pain, diarrhea and nausea.  Genitourinary: Negative for dysuria.  Musculoskeletal: Negative for arthralgias and myalgias.    Objective:  BP 138/76   Pulse (!) 59   Temp 97 F (36.1 C) (Oral)   Ht '5\' 1"'  (1.549 m)   Wt 171 lb 4 oz (77.7 kg)   SpO2 99%   BMI 32.36 kg/m   BP Readings from Last 3 Encounters:  05/16/16 138/76  03/04/16 125/74  01/29/16 132/84    Wt Readings from Last 3 Encounters:  05/16/16 171 lb 4 oz (77.7 kg)  03/04/16 169 lb 9.6 oz (76.9 kg)  01/29/16 173 lb (78.5 kg)     Physical Exam  Constitutional: She is oriented to person, place, and time. She appears well-developed and well-nourished. No distress.  HENT:  Head: Normocephalic and atraumatic.  Right Ear: External ear normal.  Left Ear: External ear normal.  Nose: Nose normal.  Mouth/Throat: Oropharynx is clear and moist.  Eyes: Conjunctivae and EOM are normal. Pupils are equal, round, and reactive to light.  Neck: Normal range of motion. Neck supple. No thyromegaly present.  Cardiovascular: Normal rate, regular rhythm and normal heart sounds.   No murmur heard. Pulmonary/Chest: Effort normal and breath sounds normal. No respiratory distress. She has no wheezes. She has no rales.  Abdominal: Soft. Bowel sounds are normal. She exhibits no distension. There is no tenderness.  Musculoskeletal: She exhibits edema (2-3 plus to the upper  portion of the legs. No erythema).  Lymphadenopathy:    She has no cervical adenopathy.  Neurological: She is alert and oriented to person, place, and time. She has normal reflexes.  Skin: Skin is warm and dry.  Psychiatric: She has  a normal mood and affect. Her behavior is normal. Judgment and thought content normal.     Lab Results  Component Value Date   WBC 5.8 01/14/2016   HGB 12.4 01/01/2015   HCT 38.0 01/14/2016   PLT 349 01/14/2016   GLUCOSE 157 (H) 01/14/2016   CHOL 219 (H) 01/14/2016   TRIG 330 (H) 01/14/2016   HDL 34 (L) 01/14/2016   LDLCALC 107 (H) 01/01/2015   ALT 15 01/14/2016   AST 17 01/14/2016   NA 137 01/14/2016   K 4.7 01/14/2016   CL 96 01/14/2016   CREATININE 0.69 01/14/2016   BUN 7 (L) 01/14/2016   CO2 23 01/14/2016   TSH 2.000 01/14/2016   HGBA1C 7.0 08/30/2015   MICROALBUR NEGATIVE 09/01/2014    Ct Head Wo Contrast  Result Date: 02/07/2016 [facility] NEURO[facility]AG[facility] REPORT STUDY DATE: 02/06/16 PATIENT NAME: Debra Phillips DOB: 04-Aug-1929 MRN: [id number] ORDERING CLINICIAN: Kathrynn Ducking, MD CLINICAL HISTORY: 82 year old female with tremors. EXAM: CT head (without) TECHNIQUE: CT scan of the head was obtained utilizing 5 mm axial slices from the skull base to the vertex. CONTRAST: no IMAGING SITE: [facility] FINDINGS: The cortical sulci, fissures and cisterns are notable for mild perisylvian atrophy. Lateral, third and fourth ventricle are normal in size and appearance.  Calcified choroid plexus cysts noted. No extra-axial fluid collections are seen.  No intracranial hemorrhage.  No evidence of mass effect or midline shift.  The orbits and their contents, paranasal sinuses and calvarium are unremarkable.    Equivocal CT head (without) demonstrating: 1. Mild perisylvian atrophy. 2. No acute findings. 3. No significant change from CT on 09/26/12. INTERPRETING PHYSICIAN: Penni Bombard, MD Certified in Neurology, Neurophysiology and Neuroimaging [facility] [address] [place], [place] [address] [phone]    Assessment & Plan:   Debra Phillips was seen today for leg swelling.  Diagnoses and all orders for this  visit:  Controlled type 2 diabetes mellitus without complication, without long-term current use of insulin (HCC) -     Bayer DCA Hb A1c Waived  Edema of both legs -     Brain natriuretic peptide -     CMP14+EGFR -     Bayer DCA Hb A1c Waived  Other orders -     furosemide (LASIX) 20 MG tablet; 3 daily for three days, then two daily -     potassium chloride SA (K-DUR,KLOR-CON) 10 MEQ tablet; Take 1 tablet (10 mEq total) by mouth daily. As a potassium supplement    Due to her use of lisinopril, Minitran keep the potassium dose low and see her back in a week with a stat potassium level at that time.  I have changed Debra Phillips's furosemide. I am also having her start on potassium chloride. Additionally, I am having her maintain her aspirin, Cinnamon, multivitamin, traMADol, Cholecalciferol, (Flaxseed, Linseed, (FLAX SEED OIL PO)), Fish Oil, Calcium Carb-Cholecalciferol, ONETOUCH DELICA LANCETS 16W, Probiotic Product (PROBIOTIC DAILY PO), Zoledronic Acid (RECLAST IV), Multiple Vitamins-Minerals (ICAPS AREDS 2 PO), diltiazem, omeprazole, nitroGLYCERIN, glucose blood, ESTRACE VAGINAL, lisinopril-hydrochlorothiazide, metFORMIN, and ciprofloxacin.  Meds ordered this encounter  Medications  . ciprofloxacin (CIPRO) 250 MG tablet  . furosemide (LASIX) 20  MG tablet    Sig: 3 daily for three days, then two daily    Dispense:  60 tablet    Refill:  0  . potassium chloride SA (K-DUR,KLOR-CON) 10 MEQ tablet    Sig: Take 1 tablet (10 mEq total) by mouth daily. As a potassium supplement    Dispense:  30 tablet    Refill:  0     Follow-up: Return in about 1 week (around 05/23/2016).  Claretta Fraise, M.D.

## 2016-05-20 ENCOUNTER — Other Ambulatory Visit: Payer: Medicare Other

## 2016-05-20 DIAGNOSIS — E119 Type 2 diabetes mellitus without complications: Secondary | ICD-10-CM | POA: Diagnosis not present

## 2016-05-20 DIAGNOSIS — R600 Localized edema: Secondary | ICD-10-CM | POA: Diagnosis not present

## 2016-05-20 LAB — BAYER DCA HB A1C WAIVED: HB A1C (BAYER DCA - WAIVED): 6.7 % (ref <7.0)

## 2016-05-21 LAB — BRAIN NATRIURETIC PEPTIDE: BNP: 54.9 pg/mL (ref 0.0–100.0)

## 2016-05-21 LAB — CMP14+EGFR
A/G RATIO: 1.9 (ref 1.2–2.2)
ALBUMIN: 4.5 g/dL (ref 3.5–4.7)
ALK PHOS: 67 IU/L (ref 39–117)
ALT: 11 IU/L (ref 0–32)
AST: 16 IU/L (ref 0–40)
BILIRUBIN TOTAL: 0.4 mg/dL (ref 0.0–1.2)
BUN / CREAT RATIO: 18 (ref 12–28)
BUN: 14 mg/dL (ref 8–27)
CHLORIDE: 90 mmol/L — AB (ref 96–106)
CO2: 27 mmol/L (ref 18–29)
Calcium: 9.9 mg/dL (ref 8.7–10.3)
Creatinine, Ser: 0.78 mg/dL (ref 0.57–1.00)
GFR calc non Af Amer: 70 mL/min/{1.73_m2} (ref >59)
GFR, EST AFRICAN AMERICAN: 80 mL/min/{1.73_m2} (ref >59)
GLUCOSE: 142 mg/dL — AB (ref 65–99)
Globulin, Total: 2.4 g/dL (ref 1.5–4.5)
POTASSIUM: 4.4 mmol/L (ref 3.5–5.2)
Sodium: 136 mmol/L (ref 134–144)
TOTAL PROTEIN: 6.9 g/dL (ref 6.0–8.5)

## 2016-05-23 ENCOUNTER — Ambulatory Visit (INDEPENDENT_AMBULATORY_CARE_PROVIDER_SITE_OTHER): Payer: Medicare Other | Admitting: Family Medicine

## 2016-05-23 ENCOUNTER — Encounter: Payer: Self-pay | Admitting: Family Medicine

## 2016-05-23 VITALS — BP 119/75 | HR 70 | Temp 96.7°F | Ht 61.0 in | Wt 168.0 lb

## 2016-05-23 DIAGNOSIS — I2510 Atherosclerotic heart disease of native coronary artery without angina pectoris: Secondary | ICD-10-CM

## 2016-05-23 DIAGNOSIS — E119 Type 2 diabetes mellitus without complications: Secondary | ICD-10-CM | POA: Diagnosis not present

## 2016-05-23 DIAGNOSIS — R600 Localized edema: Secondary | ICD-10-CM | POA: Diagnosis not present

## 2016-05-23 MED ORDER — JOBST ACTIVE 20-30MMHG MEDIUM MISC: 11 refills | Status: DC

## 2016-05-23 MED ORDER — FUROSEMIDE 20 MG PO TABS: 60.0000 mg | ORAL_TABLET | Freq: Every day | ORAL | 0 refills | Status: DC

## 2016-05-23 NOTE — Progress Notes (Signed)
Subjective:  Patient ID: Debra Phillips, female    DOB: [birth year]  Age: 82 y.o. MRN: [id number]  CC: 1 week recheck (pt here today following up on her swelling in the legs, which she says is better)   HPI [facility] presents for continued swelling in the legs over the last week . She thinks it's a little better.. She's no longer having shortness of breath but she feels more weak. She has problems with incontinence but no known kidney issues. Her blood sugar has been stable lately. She is a known diabetic A1c was excellent at 6.7, 3 days ago. Her B peptide was normal at that time as well. She states that she has had stasis cellulitis in the past. She had compression stockings along time ago but hasn't worn them in a long time.  History Hanna has a past medical history of Abnormality of gait (01/29/2016); Aortic root dilatation ([place]); Breast cyst; Cataract; Diabetes mellitus without complication ([place]); Dyslipidemia; Esophageal stricture; GERD (gastroesophageal reflux disease); Hyperlipidemia; Hypertension; Macular pucker, left eye; Osteoarthritis; and Osteoporosis.   She has a past surgical history that includes Partial hysterectomy; Total knee arthroplasty; Shoulder surgery; Knee arthroscopy (2004); Lumbar fusion (2000,2003); Radial head arthroplasty (5/12); Back surgery; Abdominal hysterectomy; Appendectomy; Cholecystectomy (1993); Cardiac catheterization (2010); Fracture surgery; Kyphosis surgery; Hardware Removal (12/10/2011); Eye surgery; and Eye surgery.   Her family history includes Arthritis in her sister; Brain cancer in her father; COPD in her brother and sister; Diabetes in her other; Emphysema in her brother; Heart disease in her brother; Liver cancer in her brother; Lung cancer in her brother; Pneumonia in her mother; Tremor in her brother and father.She reports that she has never smoked. She has never used smokeless tobacco. She reports that she does not drink alcohol or use  drugs.    ROS Review of Systems  Constitutional: Positive for fatigue. Negative for activity change, appetite change and fever.  HENT: Negative for congestion, rhinorrhea and sore throat.   Eyes: Negative for visual disturbance.  Respiratory: Negative for cough and shortness of breath.   Cardiovascular: Positive for leg swelling. Negative for chest pain and palpitations.  Gastrointestinal: Negative for abdominal pain, diarrhea and nausea.  Genitourinary: Negative for dysuria.  Musculoskeletal: Negative for arthralgias and myalgias.  Neurological: Positive for weakness.    Objective:  BP 119/75   Pulse 70   Temp (!) 96.7 F (35.9 C) (Oral)   Ht 5\' 1"  (1.549 m)   Wt 168 lb (76.2 kg)   BMI 31.74 kg/m   BP Readings from Last 3 Encounters:  05/23/16 119/75  05/16/16 138/76  03/04/16 125/74    Wt Readings from Last 3 Encounters:  05/23/16 168 lb (76.2 kg)  05/16/16 171 lb 4 oz (77.7 kg)  03/04/16 169 lb 9.6 oz (76.9 kg)     Physical Exam  Constitutional: She is oriented to person, place, and time. She appears well-developed and well-nourished. No distress.  HENT:  Head: Normocephalic and atraumatic.  Right Ear: External ear normal.  Left Ear: External ear normal.  Nose: Nose normal.  Mouth/Throat: Oropharynx is clear and moist.  Eyes: Conjunctivae and EOM are normal. Pupils are equal, round, and reactive to light.  Neck: Normal range of motion. Neck supple. No thyromegaly present.  Cardiovascular: Normal rate, regular rhythm and normal heart sounds.   No murmur heard. Pulmonary/Chest: Effort normal and breath sounds normal. No respiratory distress. She has no wheezes. She has no rales.  Abdominal: Soft. Bowel sounds are  normal. She exhibits no distension. There is no tenderness.  Musculoskeletal: She exhibits edema (2+ to the midleg bilaterally. No erythema).  Lymphadenopathy:    She has no cervical adenopathy.  Neurological: She is alert and oriented to person,  place, and time. She has normal reflexes.  Skin: Skin is warm and dry.  Psychiatric: She has a normal mood and affect. Her behavior is normal. Judgment and thought content normal.     Lab Results  Component Value Date   WBC 5.8 01/14/2016   HGB 12.4 01/01/2015   HCT 38.0 01/14/2016   PLT 349 01/14/2016   GLUCOSE 142 (H) 05/20/2016   CHOL 219 (H) 01/14/2016   TRIG 330 (H) 01/14/2016   HDL 34 (L) 01/14/2016   LDLCALC 107 (H) 01/01/2015   ALT 11 05/20/2016   AST 16 05/20/2016   NA 136 05/20/2016   K 4.4 05/20/2016   CL 90 (L) 05/20/2016   CREATININE 0.78 05/20/2016   BUN 14 05/20/2016   CO2 27 05/20/2016   TSH 2.000 01/14/2016   HGBA1C 7.0 08/30/2015   MICROALBUR NEGATIVE 09/01/2014    Ct Head Wo Contrast  Result Date: 02/07/2016 [facility] NEUROIMAG[facility] REPORT STUDY DATE: 02/06/16 PATIENT NAME: VERLEAN NAVIDAD DOB: 29-Oct-1928 MRN: [id number] ORDERING CLINICIAN: Kathrynn Ducking, MD CLINICAL HISTORY: 82 year old female with tremors. EXAM: CT head (without) TECHNIQUE: CT scan of the head was obtained utilizing 5 mm axial slices from the skull base to the vertex. CONTRAST: no IMAGING SITE: [facility] FINDINGS: The cortical sulci, fissures and cisterns are notable for mild perisylvian atrophy. Lateral, third and fourth ventricle are normal in size and appearance.  Calcified choroid plexus cysts noted. No extra-axial fluid collections are seen.  No intracranial hemorrhage.  No evidence of mass effect or midline shift.  The orbits and their contents, paranasal sinuses and calvarium are unremarkable.    Equivocal CT head (without) demonstrating: 1. Mild perisylvian atrophy. 2. No acute findings. 3. No significant change from CT on 09/26/12. INTERPRETING PHYSICIAN: Penni Bombard, MD Certified in Neurology, Neurophysiology and Neuroimaging [facility] [address], [place], [place] [address] [phone]     Assessment & Plan:   Melva was seen today for 1 week recheck.  Diagnoses and all orders for this visit:  Edema of both legs  Controlled type 2 diabetes mellitus without complication, without long-term current use of insulin (HCC)  Other orders -     Discontinue: furosemide (LASIX) 20 MG tablet; Take 3 tablets (60 mg total) by mouth daily. 3 daily for three days, then two daily -     furosemide (LASIX) 20 MG tablet; Take 3 tablets (60 mg total) by mouth daily. -     Elastic Bandages & Supports (JOBST ACTIVE 20-30MMHG MEDIUM) MISC; Wear daily to support circulation and prevent swelling       Medication List       Accurate as of 05/23/16  5:15 PM. Always use your most recent med list.          Calcium Carb-Cholecalciferol 600-500 MG-UNIT Caps Take 1 capsule by mouth every morning.   Cholecalciferol 1000 units tablet Take 1,000 Units by mouth daily.   diltiazem 180 MG 24 hr capsule Commonly known as:  CARDIZEM CD Take 1 capsule (180 mg total) by mouth daily.   ESTRACE VAGINAL 0.1 MG/GM vaginal cream Generic drug:  estradiol Place 1 Applicatorful vaginally 3 (three) times a week.   Fish Oil  1000 MG Caps Take 2,000 mg by mouth at bedtime.   furosemide 20 MG tablet Commonly known as:  LASIX Take 3 tablets (60 mg total) by mouth daily.   ICAPS AREDS 2 PO Take 1 capsule by mouth 2 (two) times daily.   JOBST ACTIVE 20-30MMHG MEDIUM Misc Wear daily to support circulation and prevent swelling   metFORMIN 500 MG tablet Commonly known as:  GLUCOPHAGE Take 1 tablet (500 mg total) by mouth 2 (two) times daily.   nitroGLYCERIN 0.4 MG SL tablet Commonly known as:  NITROSTAT Place 1 tablet (0.4 mg total) under the tongue every 5 (five) minutes as needed for chest pain.   omeprazole 40 MG capsule Commonly known as:  PRILOSEC Take 1 capsule (40 mg total) by mouth 2 (two) times daily.   potassium chloride 10 MEQ tablet Commonly known as:  K-DUR,KLOR-CON Take 1  tablet (10 mEq total) by mouth daily. As a potassium supplement   PROBIOTIC DAILY PO Take 1 capsule by mouth daily.   traMADol 50 MG tablet Commonly known as:  ULTRAM Take 50 mg by mouth at bedtime as needed for moderate pain.         Follow-up: Return in about 2 weeks (around 06/06/2016).  Claretta Fraise, M.D.

## 2016-05-23 NOTE — Addendum Note (Signed)
Addended by: Claretta Fraise on: 05/23/2016 05:22 PM   Modules accepted: Orders

## 2016-06-06 ENCOUNTER — Other Ambulatory Visit: Payer: Self-pay | Admitting: Family Medicine

## 2016-06-06 DIAGNOSIS — N952 Postmenopausal atrophic vaginitis: Secondary | ICD-10-CM | POA: Diagnosis not present

## 2016-06-06 DIAGNOSIS — N819 Female genital prolapse, unspecified: Secondary | ICD-10-CM | POA: Diagnosis not present

## 2016-06-13 ENCOUNTER — Encounter: Payer: Self-pay | Admitting: Family Medicine

## 2016-06-13 ENCOUNTER — Ambulatory Visit (INDEPENDENT_AMBULATORY_CARE_PROVIDER_SITE_OTHER): Payer: Medicare Other

## 2016-06-13 ENCOUNTER — Ambulatory Visit (INDEPENDENT_AMBULATORY_CARE_PROVIDER_SITE_OTHER): Payer: Medicare Other | Admitting: Family Medicine

## 2016-06-13 VITALS — BP 136/80 | HR 78 | Temp 96.9°F | Ht 61.0 in | Wt 167.0 lb

## 2016-06-13 DIAGNOSIS — Z23 Encounter for immunization: Secondary | ICD-10-CM

## 2016-06-13 DIAGNOSIS — Z780 Asymptomatic menopausal state: Secondary | ICD-10-CM

## 2016-06-13 DIAGNOSIS — N814 Uterovaginal prolapse, unspecified: Secondary | ICD-10-CM | POA: Diagnosis not present

## 2016-06-13 DIAGNOSIS — I2510 Atherosclerotic heart disease of native coronary artery without angina pectoris: Secondary | ICD-10-CM | POA: Diagnosis not present

## 2016-06-13 DIAGNOSIS — E1169 Type 2 diabetes mellitus with other specified complication: Secondary | ICD-10-CM

## 2016-06-13 DIAGNOSIS — I10 Essential (primary) hypertension: Secondary | ICD-10-CM

## 2016-06-13 DIAGNOSIS — E785 Hyperlipidemia, unspecified: Secondary | ICD-10-CM | POA: Diagnosis not present

## 2016-06-13 DIAGNOSIS — Z13820 Encounter for screening for osteoporosis: Secondary | ICD-10-CM

## 2016-06-13 DIAGNOSIS — E559 Vitamin D deficiency, unspecified: Secondary | ICD-10-CM

## 2016-06-13 DIAGNOSIS — M47816 Spondylosis without myelopathy or radiculopathy, lumbar region: Secondary | ICD-10-CM

## 2016-06-13 DIAGNOSIS — K219 Gastro-esophageal reflux disease without esophagitis: Secondary | ICD-10-CM | POA: Diagnosis not present

## 2016-06-13 DIAGNOSIS — E119 Type 2 diabetes mellitus without complications: Secondary | ICD-10-CM | POA: Diagnosis not present

## 2016-06-13 NOTE — Addendum Note (Signed)
Addended by: Zannie Cove on: 06/13/2016 10:11 AM   Modules accepted: Orders

## 2016-06-13 NOTE — Progress Notes (Signed)
Subjective:    Patient ID: Debra Phillips, female    DOB: Jun 07, 1929, 82 y.o.   MRN: [id number]  HPI Pt here for follow up and management of chronic medical problems which includes diabetes and hypertension. She is taking medications regularly.The patient has no specific complaints today. She is contemplating doing some surgery for better bladder control and she and her daughter have been given options and are thinking about what is the best thing to do. She will get her flu shot today. The last hemoglobin A1c which was done in September was good at 6.7%. She is due to get a DEXA scan and chest x-ray and we'll get additional lab work, met what is been done already. She will also be given an FOBT to return. The patient is pleasant and alert. She is has some neck and chest wall pain secondary to reaching up with a broom to clean out her gutters. She does not describe any chest pressure or tightness or palpitations. She does get occasional shortness of breath with walking. She does have occasional nausea but this seems to be associated with medications. She denies any blood in the stool or black tarry bowel movements. She has her ongoing issues with her bladder with pain and sometimes burning with passing her water and not being able to empty well. Her biggest need for getting the surgery is the recurrent infections that she has and the intolerance to medication that she is experiencing. Her family is very supportive of her. 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 The patient indicates that her blood sugars at home have been running in the 1:30 to sometimes as high as the 200 range. At her last visit here she was taken off of her lisinopril HCT and put on Lasix 60 mg daily. She is currently only been taking 20 mg daily of Lasix because of the edema and cellulitis. She is still taking the potassium  that she was prescribed at that time. We will make sure we get another BMP today.    Patient Active Problem List   Diagnosis Date Noted  . Tremor 01/29/2016  . Abnormality of gait 01/29/2016  . Type 2 diabetes mellitus with hyperlipidemia ([place]) 01/14/2016  . Osteoarthritis of lumbar spine 05/08/2015  . Spondylosis of lumbar region without myelopathy or radiculopathy 09/01/2014  . Diabetes type 2, controlled ([place]) 04/26/2014  . Vitamin D deficiency 04/26/2014  . Chronic cystitis 04/26/2014  . Atrophic vaginitis 04/26/2014  . Solitary pulmonary nodule 03/20/2014  . Chest pain 03/18/2014  . Osteoporotic compression fracture of spine with routine healing 03/08/2014  . PALPITATIONS 02/27/2010  . Coronary atherosclerosis 12/12/2009  . Aortic aneurysm of unspecified site without mention of rupture 06/27/2009  . DYSPNEA 06/27/2009  . DYSLIPIDEMIA 12/12/2008  . Essential hypertension 12/12/2008  . GERD 12/12/2008  . OSTEOARTHRITIS 12/12/2008   Outpatient Encounter Prescriptions as of 06/13/2016  Medication Sig  . Calcium Carb-Cholecalciferol 600-500 MG-UNIT CAPS Take 1 capsule by mouth every morning.  . Cholecalciferol 1000 UNITS tablet Take 1,000 Units by mouth daily.   Marland Kitchen diltiazem (CARDIZEM CD) 180 MG 24 hr capsule Take 1 capsule (180 mg total) by mouth daily.  Water engineer Bandages & Supports (JOBST ACTIVE 20-30MMHG MEDIUM) MISC Wear daily to support circulation and prevent swelling  . ESTRACE VAGINAL 0.1 MG/GM vaginal cream Place 1 Applicatorful vaginally 3 (three) times a week.   Marland Kitchen  furosemide (LASIX) 20 MG tablet Take 3 tablets (60 mg total) by mouth daily.  . metFORMIN (GLUCOPHAGE) 500 MG tablet Take 1 tablet (500 mg total) by mouth 2 (two) times daily.  . Multiple Vitamins-Minerals (ICAPS AREDS 2 PO) Take 1 capsule by mouth 2 (two) times daily.  . nitroGLYCERIN (NITROSTAT) 0.4 MG SL tablet Place 1 tablet (0.4 mg total) under the tongue every 5 (five) minutes as needed for chest pain.    . Omega-3 Fatty Acids (FISH OIL) 1000 MG CAPS Take 2,000 mg by mouth at bedtime.  Marland Kitchen omeprazole (PRILOSEC) 40 MG capsule Take 1 capsule (40 mg total) by mouth 2 (two) times daily.  . potassium chloride (K-DUR) 10 MEQ tablet TAKE (1) TABLET DAILY AS DIRECTED.  . Probiotic Product (PROBIOTIC DAILY PO) Take 1 capsule by mouth daily.  . traMADol (ULTRAM) 50 MG tablet Take 50 mg by mouth at bedtime as needed for moderate pain.    No facility-administered encounter medications on file as of 06/13/2016.       Review of Systems  Constitutional: Negative.   HENT: Negative.   Eyes: Negative.   Respiratory: Negative.   Cardiovascular: Negative.   Gastrointestinal: Negative.   Endocrine: Negative.   Genitourinary: Negative.   Musculoskeletal: Negative.   Skin: Negative.   Allergic/Immunologic: Negative.   Neurological: Negative.   Hematological: Negative.        Objective:   Physical Exam  Constitutional: She is oriented to person, place, and time. She appears well-developed and well-nourished. No distress.  The patient is pleasant and alert and even though she is 82 years old she is very with it. She seems to have a full understanding of the bladder issues and the potential surgery that she may be having down the road after she visits with the cardiologist to get a release from him.  HENT:  Head: Normocephalic and atraumatic.  Right Ear: External ear normal.  Left Ear: External ear normal.  Nose: Nose normal.  Mouth/Throat: Oropharynx is clear and moist. No oropharyngeal exudate.  Eyes: Conjunctivae and EOM are normal. Pupils are equal, round, and reactive to light. Right eye exhibits no discharge. Left eye exhibits no discharge. No scleral icterus.  Neck: Normal range of motion. Neck supple. No thyromegaly present.  No bruits thyromegaly or anterior cervical adenopathy  Cardiovascular: Normal rate, normal heart sounds and intact distal pulses.   No murmur heard. The heart was  slightly irregular today at 72/m  Pulmonary/Chest: Effort normal and breath sounds normal. No respiratory distress. She has no wheezes. She has no rales.  Clear anteriorly and posteriorly  Abdominal: Soft. Bowel sounds are normal. She exhibits no mass. There is no tenderness. There is no rebound and no guarding.  Generalized abdominal and suprapubic tenderness  Musculoskeletal: Normal range of motion. She exhibits edema. She exhibits no tenderness.  The weight today is asked to down 1 pound since the last visit. Minimal pretibial edema left greater than right  Lymphadenopathy:    She has no cervical adenopathy.  Neurological: She is alert and oriented to person, place, and time. She has normal reflexes. No cranial nerve deficit.  Skin: Skin is warm and dry. No rash noted.  Psychiatric: She has a normal mood and affect. Her behavior is normal. Judgment and thought content normal.  Nursing note and vitals reviewed.  BP 136/80 (BP Location: Left Arm)   Pulse 78   Temp (!) 96.9 F (36.1 C) (Oral)   Ht '5\' 1"'  (1.549 m)  Wt 167 lb (75.8 kg)   BMI 31.55 kg/m         Assessment & Plan:  1. Controlled type 2 diabetes mellitus without complication, without long-term current use of insulin ([place]) -Continue with aggressive therapeutic lifestyle changes and current blood sugar treatment pending results of lab work. The last A1c was good at 6.7% and this was in September. - CBC with Differential/Platelet  2. Essential hypertension -The blood pressure is good today just taking 20 mg of furosemide and her current potassium. She is no longer taking the lisinopril HCT. We will stay with the Lasix at least until she sees the cardiologist. - CBC with Differential/Platelet - Lipid panel - DG Chest 2 View; Future  3. Vitamin D deficiency -Continue current treatment pending results of lab work - CBC with Differential/Platelet - VITAMIN D 25 Hydroxy (Vit-D Deficiency, Fractures)  4.  Gastroesophageal reflux disease, esophagitis presence not specified -She has no complaints today with reflux. She will continue watching her diet closely and not eating late at night. - CBC with Differential/Platelet - Lipid panel  5. Screening for osteoporosis -DEXA scan today - CBC with Differential/Platelet - DG [facility] DEXA; Future  6. Postmenopausal -Continue with calcium and vitamin D - CBC with Differential/Platelet - DG [facility] DEXA; Future  7. Spondylosis of lumbar region without myelopathy or radiculopathy -Avoid lifting pushing and pulling and avoid putting herself at risk for falling  8. Type 2 diabetes mellitus with hyperlipidemia ([facility]) -Continue to monitor blood sugars at home as directed  9. Atherosclerosis of native coronary artery of native heart without angina pectoris -Continue aggressive therapeutic lifestyle changes  10. Cystocele with small rectocele and uterine descent -Follow-up with urology as planned for possible repair  Patient Instructions                       Medicare Annual Wellness Visit  [facility] and the medical providers at [facility] strive to bring you the best medical care.  In doing so we not only want to address your current medical conditions and concerns but also to detect new conditions early and prevent illness, disease and health-related problems.    Medicare offers a yearly Wellness Visit which allows our clinical staff to assess your need for preventative services including immunizations, lifestyle education, counseling to decrease risk of preventable diseases and screening for fall risk and other medical concerns.    This visit is provided free of charge (no copay) for all Medicare recipients. The clinical pharmacists at [facility] have begun to conduct these Wellness Visits which will also include a thorough review of all your medications.    As you primary medical provider recommend that  you make an appointment for your Annual Wellness Visit if you have not done so already this year.  You may set up this appointment before you leave today or you may call back [phone]) and schedule an appointment.  Please make sure when you call that you mention that you are scheduling your Annual Wellness Visit with the clinical pharmacist so that the appointment may be made for the proper length of time.    Continue current medications. Continue good therapeutic lifestyle changes which include good diet and exercise. Fall precautions discussed with patient. If an FOBT was given today- please return it to our front desk. If you are over 80 years old - you may need Prevnar 59 or the adult Pneumonia vaccine.  **Flu shots are  available--- please call and schedule a FLU-CLINIC appointment**  After your visit with [place] today you will receive a survey in the mail or online from [employer] regarding your care with [place]. Please take a moment to fill this out. Your feedback is very important to [place] as you can help [place] better understand your patient needs as well as improve your experience and satisfaction. WE CARE ABOUT YOU!!!   Follow-up with cardiology as planned Follow-up with urology as planned possible surgery because of persistent symptomatology. Continue to stay active but do not be doing for loose things like climbing and cleaning gutters. Continue to drink plenty of fluids Record home blood sugars and weights as directed Take these with you to the visit to the cardiologist We will be checking her potassium and for the time being stay with the Lasix 20 mg.   Arrie Senate MD

## 2016-06-13 NOTE — Patient Instructions (Addendum)
Medicare Annual Wellness Visit  [facility] and the medical providers at [facility] strive to bring you the best medical care.  In doing so we not only want to address your current medical conditions and concerns but also to detect new conditions early and prevent illness, disease and health-related problems.    Medicare offers a yearly Wellness Visit which allows our clinical staff to assess your need for preventative services including immunizations, lifestyle education, counseling to decrease risk of preventable diseases and screening for fall risk and other medical concerns.    This visit is provided free of charge (no copay) for all Medicare recipients. The clinical pharmacists at [facility] have begun to conduct these Wellness Visits which will also include a thorough review of all your medications.    As you primary medical provider recommend that you make an appointment for your Annual Wellness Visit if you have not done so already this year.  You may set up this appointment before you leave today or you may call back [phone] and schedule an appointment.  Please make sure when you call that you mention that you are scheduling your Annual Wellness Visit with the clinical pharmacist so that the appointment may be made for the proper length of time.    Continue current medications. Continue good therapeutic lifestyle changes which include good diet and exercise. Fall precautions discussed with patient. If an FOBT was given today- please return it to our front desk. If you are over 80 years old - you may need Prevnar 28 or the adult Pneumonia vaccine.  **Flu shots are available--- please call and schedule a FLU-CLINIC appointment**  After your visit with [place] today you will receive a survey in the mail or online from [employer] regarding your care with [place]. Please take a moment to fill this out. Your feedback is very  important to [place] as you can help [place] better understand your patient needs as well as improve your experience and satisfaction. WE CARE ABOUT YOU!!!   Follow-up with cardiology as planned Follow-up with urology as planned possible surgery because of persistent symptomatology. Continue to stay active but do not be doing for loose things like climbing and cleaning gutters. Continue to drink plenty of fluids Record home blood sugars and weights as directed Take these with you to the visit to the cardiologist We will be checking her potassium and for the time being stay with the Lasix 20 mg.

## 2016-06-14 LAB — LIPID PANEL
CHOLESTEROL TOTAL: 208 mg/dL — AB (ref 100–199)
Chol/HDL Ratio: 5 ratio units — ABNORMAL HIGH (ref 0.0–4.4)
HDL: 42 mg/dL (ref >39)
LDL Calculated: 123 mg/dL — ABNORMAL HIGH (ref 0–99)
TRIGLYCERIDES: 216 mg/dL — AB (ref 0–149)
VLDL Cholesterol Cal: 43 mg/dL — ABNORMAL HIGH (ref 5–40)

## 2016-06-14 LAB — BMP8+EGFR
BUN/Creatinine Ratio: 17 (ref 12–28)
BUN: 12 mg/dL (ref 8–27)
CALCIUM: 9.7 mg/dL (ref 8.7–10.3)
CO2: 25 mmol/L (ref 18–29)
CREATININE: 0.7 mg/dL (ref 0.57–1.00)
Chloride: 98 mmol/L (ref 96–106)
GFR calc Af Amer: 91 mL/min/{1.73_m2} (ref >59)
GFR calc non Af Amer: 79 mL/min/{1.73_m2} (ref >59)
Glucose: 160 mg/dL — ABNORMAL HIGH (ref 65–99)
Potassium: 5.3 mmol/L — ABNORMAL HIGH (ref 3.5–5.2)
Sodium: 140 mmol/L (ref 134–144)

## 2016-06-14 LAB — CBC WITH DIFFERENTIAL/PLATELET
Basophils Absolute: 0 10*3/uL (ref 0.0–0.2)
Basos: 1 %
EOS (ABSOLUTE): 0.1 10*3/uL (ref 0.0–0.4)
EOS: 1 %
HEMATOCRIT: 39.3 % (ref 34.0–46.6)
Hemoglobin: 12.9 g/dL (ref 11.1–15.9)
IMMATURE GRANULOCYTES: 0 %
Immature Grans (Abs): 0 10*3/uL (ref 0.0–0.1)
LYMPHS ABS: 2.4 10*3/uL (ref 0.7–3.1)
Lymphs: 38 %
MCH: 30.1 pg (ref 26.6–33.0)
MCHC: 32.8 g/dL (ref 31.5–35.7)
MCV: 92 fL (ref 79–97)
MONOCYTES: 7 %
Monocytes Absolute: 0.4 10*3/uL (ref 0.1–0.9)
Neutrophils Absolute: 3.4 10*3/uL (ref 1.4–7.0)
Neutrophils: 53 %
PLATELETS: 338 10*3/uL (ref 150–379)
RBC: 4.28 x10E6/uL (ref 3.77–5.28)
RDW: 14 % (ref 12.3–15.4)
WBC: 6.3 10*3/uL (ref 3.4–10.8)

## 2016-06-14 LAB — VITAMIN D 25 HYDROXY (VIT D DEFICIENCY, FRACTURES): Vit D, 25-Hydroxy: 58.8 ng/mL (ref 30.0–100.0)

## 2016-06-16 ENCOUNTER — Encounter: Payer: Self-pay | Admitting: Cardiology

## 2016-06-17 ENCOUNTER — Ambulatory Visit (INDEPENDENT_AMBULATORY_CARE_PROVIDER_SITE_OTHER): Payer: Medicare Other | Admitting: Cardiology

## 2016-06-17 ENCOUNTER — Encounter: Payer: Self-pay | Admitting: Cardiology

## 2016-06-17 VITALS — BP 128/82 | HR 84 | Ht 63.0 in | Wt 168.8 lb

## 2016-06-17 DIAGNOSIS — Z01810 Encounter for preprocedural cardiovascular examination: Secondary | ICD-10-CM | POA: Diagnosis not present

## 2016-06-17 DIAGNOSIS — I2510 Atherosclerotic heart disease of native coronary artery without angina pectoris: Secondary | ICD-10-CM | POA: Diagnosis not present

## 2016-06-17 DIAGNOSIS — R9431 Abnormal electrocardiogram [ECG] [EKG]: Secondary | ICD-10-CM

## 2016-06-17 NOTE — Patient Instructions (Signed)
Medication Instructions:  Continue current medications  Labwork: None Ordered  Testing/Procedures: Your physician has requested that you have an echocardiogram. Echocardiography is a painless test that uses sound waves to create images of your heart. It provides your doctor with information about the size and shape of your heart and how well your heart's chambers and valves are working. This procedure takes approximately one hour. There are no restrictions for this procedure.  Your physician has requested that you have a lexiscan myoview. For further information please visit [email or web address]. Please follow instruction sheet, as given.   Follow-Up: Your physician recommends that you schedule a follow-up appointment in: As Needed   Any Other Special Instructions Will Be Listed Below (If Applicable).   If you need a refill on your cardiac medications before your next appointment, please call your pharmacy.   

## 2016-06-17 NOTE — Progress Notes (Signed)
HPI The patient presents for followup of chest pain.  Previously a stress perfusion study in 2015 was negative for any evidence of ischemia.  The patient is going to have bladder surgery. She was referred for preoperative clearance. She's had no cardiac complaints. However, the other day she was washing off her gutters and reaching above her head and did develop some chest discomfort. She was and whether this is musculoskeletal pain. She denies any shortness of breath, PND or orthopnea. She's had no chest pressure, neck or arm discomfort. She's had no weight gain or edema.  Allergies  Allergen Reactions  . Codeine Nausea And Vomiting  . Doxycycline Hives  . Other Nausea Only    Almost all antibiotics  . Sulfonamide Derivatives Hives  . Vancomycin Itching    Current Outpatient Prescriptions  Medication Sig Dispense Refill  . Calcium Carb-Cholecalciferol 600-500 MG-UNIT CAPS Take 1 capsule by mouth every morning.    . Cholecalciferol 1000 UNITS tablet Take 1,000 Units by mouth daily.     Marland Kitchen CINNAMON PO Take 1 tablet by mouth daily.    Marland Kitchen diltiazem (CARDIZEM CD) 180 MG 24 hr capsule Take 1 capsule (180 mg total) by mouth daily. 90 capsule 1  . Elastic Bandages & Supports (JOBST ACTIVE 20-30MMHG MEDIUM) MISC Wear daily to support circulation and prevent swelling 2 each 11  . ESTRACE VAGINAL 0.1 MG/GM vaginal cream Place 1 Applicatorful vaginally 3 (three) times a week.     . furosemide (LASIX) 20 MG tablet Take 3 tablets (60 mg total) by mouth daily. 90 tablet 0  . metFORMIN (GLUCOPHAGE) 500 MG tablet Take 1 tablet (500 mg total) by mouth 2 (two) times daily. 60 tablet 4  . Multiple Vitamins-Minerals (ICAPS AREDS 2 PO) Take 1 capsule by mouth 2 (two) times daily.    . nitroGLYCERIN (NITROSTAT) 0.4 MG SL tablet Place 1 tablet (0.4 mg total) under the tongue every 5 (five) minutes as needed for chest pain. 25 tablet 3  . Omega-3 Fatty Acids (FISH OIL) 1000 MG CAPS Take 2,000 mg by mouth at  bedtime.    Marland Kitchen omeprazole (PRILOSEC) 40 MG capsule Take 1 capsule (40 mg total) by mouth 2 (two) times daily. 60 capsule 5  . potassium chloride (K-DUR) 10 MEQ tablet TAKE (1) TABLET DAILY AS DIRECTED. 30 tablet 5  . Probiotic Product (PROBIOTIC DAILY PO) Take 1 capsule by mouth daily.    . traMADol (ULTRAM) 50 MG tablet Take 50 mg by mouth at bedtime as needed for moderate pain.      No current facility-administered medications for this visit.     Past Medical History:  Diagnosis Date  . Abnormality of gait 01/29/2016  . Aortic root dilatation ([facility])    Mild  . Breast cyst    Right  . Cataract   . Diabetes mellitus without complication ([place])   . Dyslipidemia   . Esophageal stricture    s/p dilation  . GERD (gastroesophageal reflux disease)   . Hyperlipidemia   . Hypertension   . Macular pucker, left eye   . Osteoarthritis   . Osteoporosis     Past Surgical History:  Procedure Laterality Date  . ABDOMINAL HYSTERECTOMY    . APPENDECTOMY    . BACK SURGERY    . CARDIAC CATHETERIZATION  2010   both cataracts  . CHOLECYSTECTOMY  1993  . EYE SURGERY     cataracts  . EYE SURGERY     for macular pucker  .  FRACTURE SURGERY     rt wrist2010  . HARDWARE REMOVAL  12/10/2011   Procedure: HARDWARE REMOVAL;  Surgeon: Schuyler Amor, MD;  Location: [facility];  Service: Orthopedics;  Laterality: Right;  radial head  . KNEE ARTHROSCOPY  2004   Left and right  . KYPHOSIS SURGERY    . LUMBAR FUSION  [id number]  . PARTIAL HYSTERECTOMY    . RADIAL HEAD ARTHROPLASTY  5/12   orif rt radial head  . SHOULDER SURGERY     Right  . TOTAL KNEE ARTHROPLASTY     Left    ROS:    Otherwise as stated in the HPI and negative for all other systems.  PHYSICAL EXAM BP 128/82   Pulse 84   Ht 5\' 3"  (1.6 m)   Wt 168 lb 12.8 oz (76.6 kg)   BMI 29.90 kg/m  GENERAL:  Well appearing HEENT:  Pupils equal round and reactive, fundi not visualized, oral mucosa unremarkable,  dentures NECK:  No jugular venous distention, waveform within normal limits, carotid upstroke brisk and symmetric, no bruits, no thyromegaly LUNGS:  Clear to auscultation bilaterally CHEST:  Unremarkable HEART:  PMI not displaced or sustained,S1 and S2 within normal limits, no S3, no S4, no clicks, no rubs, no murmurs ABD:  Flat, positive bowel sounds normal in frequency in pitch, no bruits, no rebound, no guarding, no midline pulsatile mass, no hepatomegaly, no splenomegaly EXT:  2 plus pulses throughout, mild/moderate bilateral edema, no cyanosis no clubbing  EKG:  Sinus rhythm, rate 84, left axis deviation, left anterior fascicular block, QT prolonged, inferolateral and anterolateral T wave inversions new from previous. 06/18/2016  ASSESSMENT AND PLAN  PREOP:  Given her abnormal EKG she needs a [employer].  I will also check an echocardiogram.   AORTIC ANEURYSM:  She had a mildly dilated aortic root years ago but this was not seen in follow-up with her last CT in 2015.  No further imaging is indicated.  HTN:  Her blood pressure is well controlled. She will continue the meds as listed.  EDEMA:  She drinks a lot of fluid secondary to frequent UTIs.  This is unchanged.  She will keep her compression stockings.

## 2016-06-18 ENCOUNTER — Encounter: Payer: Self-pay | Admitting: Cardiology

## 2016-06-23 ENCOUNTER — Other Ambulatory Visit (INDEPENDENT_AMBULATORY_CARE_PROVIDER_SITE_OTHER): Payer: Medicare Other

## 2016-06-23 DIAGNOSIS — Z1211 Encounter for screening for malignant neoplasm of colon: Secondary | ICD-10-CM

## 2016-06-24 LAB — FECAL OCCULT BLOOD, IMMUNOCHEMICAL: FECAL OCCULT BLD: NEGATIVE

## 2016-07-01 ENCOUNTER — Ambulatory Visit: Payer: Medicare Other | Admitting: Family

## 2016-07-03 ENCOUNTER — Telehealth (HOSPITAL_COMMUNITY): Payer: Self-pay

## 2016-07-03 ENCOUNTER — Other Ambulatory Visit: Payer: Self-pay | Admitting: Family Medicine

## 2016-07-03 DIAGNOSIS — H353131 Nonexudative age-related macular degeneration, bilateral, early dry stage: Secondary | ICD-10-CM | POA: Diagnosis not present

## 2016-07-03 DIAGNOSIS — Z7984 Long term (current) use of oral hypoglycemic drugs: Secondary | ICD-10-CM | POA: Diagnosis not present

## 2016-07-03 DIAGNOSIS — E119 Type 2 diabetes mellitus without complications: Secondary | ICD-10-CM | POA: Diagnosis not present

## 2016-07-03 DIAGNOSIS — Z961 Presence of intraocular lens: Secondary | ICD-10-CM | POA: Diagnosis not present

## 2016-07-03 LAB — HM DIABETES EYE EXAM

## 2016-07-03 NOTE — Telephone Encounter (Signed)
Encounter complete. 

## 2016-07-08 ENCOUNTER — Ambulatory Visit (HOSPITAL_COMMUNITY)
Admission: RE | Admit: 2016-07-08 | Discharge: 2016-07-08 | Disposition: A | Discharge status: Home / Self Care | Payer: Medicare Other | Source: Ambulatory Visit | Attending: Cardiovascular Disease | Admitting: Cardiovascular Disease

## 2016-07-08 ENCOUNTER — Ambulatory Visit (HOSPITAL_BASED_OUTPATIENT_CLINIC_OR_DEPARTMENT_OTHER): Payer: Medicare Other

## 2016-07-08 ENCOUNTER — Ambulatory Visit: Payer: Medicare Other | Admitting: Neurology

## 2016-07-08 ENCOUNTER — Other Ambulatory Visit: Payer: Self-pay

## 2016-07-08 DIAGNOSIS — R9431 Abnormal electrocardiogram [ECG] [EKG]: Secondary | ICD-10-CM

## 2016-07-08 DIAGNOSIS — R9439 Abnormal result of other cardiovascular function study: Secondary | ICD-10-CM | POA: Diagnosis not present

## 2016-07-08 LAB — MYOCARDIAL PERFUSION IMAGING
CHL CUP RESTING HR STRESS: 64 {beats}/min
LVDIAVOL: 85 mL (ref 46–106)
LVSYSVOL: 38 mL
NUC STRESS TID: 1.2
Peak HR: 99 {beats}/min
SDS: 1
SRS: 1
SSS: 2

## 2016-07-08 MED ORDER — REGADENOSON 0.4 MG/5ML IV SOLN
0.4000 mg | Freq: Once | INTRAVENOUS | Status: AC
  Administered 2016-07-08: 0.4 mg via INTRAVENOUS

## 2016-07-08 MED ORDER — TECHNETIUM TC 99M TETROFOSMIN IV KIT
10.8000 | PACK | Freq: Once | INTRAVENOUS | Status: AC | PRN
Start: 2016-07-08 — End: 2016-07-08
  Administered 2016-07-08: 10.8 via INTRAVENOUS
  Filled 2016-07-08: qty 11

## 2016-07-08 MED ORDER — TECHNETIUM TC 99M TETROFOSMIN IV KIT
30.2000 | PACK | Freq: Once | INTRAVENOUS | Status: AC | PRN
  Administered 2016-07-08: 30.2 via INTRAVENOUS
  Filled 2016-07-08: qty 31

## 2016-07-08 MED ORDER — AMINOPHYLLINE 25 MG/ML IV SOLN
75.0000 mg | Freq: Once | INTRAVENOUS | Status: AC
  Administered 2016-07-08: 75 mg via INTRAVENOUS

## 2016-07-09 LAB — ECHOCARDIOGRAM COMPLETE
HEIGHTINCHES: 63 in
Weight: 2688 oz

## 2016-07-11 ENCOUNTER — Ambulatory Visit (INDEPENDENT_AMBULATORY_CARE_PROVIDER_SITE_OTHER): Payer: Medicare Other | Admitting: Physician Assistant

## 2016-07-11 ENCOUNTER — Encounter: Payer: Self-pay | Admitting: Physician Assistant

## 2016-07-11 VITALS — BP 139/79 | HR 81 | Temp 96.8°F | Ht 63.0 in | Wt 166.0 lb

## 2016-07-11 DIAGNOSIS — N3000 Acute cystitis without hematuria: Secondary | ICD-10-CM | POA: Diagnosis not present

## 2016-07-11 DIAGNOSIS — I2510 Atherosclerotic heart disease of native coronary artery without angina pectoris: Secondary | ICD-10-CM | POA: Diagnosis not present

## 2016-07-11 DIAGNOSIS — H6503 Acute serous otitis media, bilateral: Secondary | ICD-10-CM

## 2016-07-11 DIAGNOSIS — R300 Dysuria: Secondary | ICD-10-CM

## 2016-07-11 MED ORDER — CEFDINIR 300 MG PO CAPS: 300.0000 mg | ORAL_CAPSULE | Freq: Two times a day (BID) | ORAL | 0 refills | Status: DC

## 2016-07-11 MED ORDER — FLUTICASONE PROPIONATE 50 MCG/ACT NA SUSP: 2.0000 | Freq: Every day | NASAL | 6 refills | Status: DC

## 2016-07-11 NOTE — Patient Instructions (Signed)
Serous Otitis Media Serous otitis media is fluid in the middle ear space. This space contains the bones for hearing and air. Air in the middle ear space helps to transmit sound.  The air gets there through the eustachian tube. This tube goes from the back of the nose (nasopharynx) to the middle ear space. It keeps the pressure in the middle ear the same as the outside world. It also helps to drain fluid from the middle ear space. CAUSES  Serous otitis media occurs when the eustachian tube gets blocked. Blockage can come from:  Ear infections.  Colds and other upper respiratory infections.  Allergies.  Irritants such as cigarette smoke.  Sudden changes in air pressure (such as descending in an airplane).  Enlarged adenoids.  A mass in the nasopharynx. During colds and upper respiratory infections, the middle ear space can become temporarily filled with fluid. This can happen after an ear infection also. Once the infection clears, the fluid will generally drain out of the ear through the eustachian tube. If it does not, then serous otitis media occurs. SIGNS AND SYMPTOMS   Hearing loss.  A feeling of fullness in the ear, without pain.  Young children may not show any symptoms but may show slight behavioral changes, such as agitation, ear pulling, or crying. DIAGNOSIS  Serous otitis media is diagnosed by an ear exam. Tests may be done to check on the movement of the eardrum. Hearing exams may also be done. TREATMENT  The fluid most often goes away without treatment. If allergy is the cause, allergy treatment may be helpful. Fluid that persists for several months may require minor surgery. A small tube is placed in the eardrum to:  Drain the fluid.  Restore the air in the middle ear space. In certain situations, antibiotic medicines are used to avoid surgery. Surgery may be done to remove enlarged adenoids (if this is the cause). HOME CARE INSTRUCTIONS   Keep children away from  tobacco smoke.  Keep all follow-up visits as directed by your health care provider. SEEK MEDICAL CARE IF:   Your hearing is not better in 3 months.  Your hearing is worse.  You have ear pain.  You have drainage from the ear.  You have dizziness.  You have serous otitis media only in one ear or have any bleeding from your nose (epistaxis).  You notice a lump on your neck. MAKE SURE YOU:  Understand these instructions.   Will watch your condition.   Will get help right away if you are not doing well or get worse.  This information is not intended to replace advice given to you by your health care provider. Make sure you discuss any questions you have with your health care provider. Document Released: 11/01/2003 Document Revised: 09/01/2014 Document Reviewed: 03/08/2013 Elsevier Interactive Patient Education  2017 [employer].

## 2016-07-13 NOTE — Progress Notes (Signed)
BP 139/79   Pulse 81   Temp (!) 96.8 F (36 C) (Oral)   Ht 5\' 3"  (1.6 m)   Wt 166 lb (75.3 kg)   BMI 29.41 kg/m    Subjective:    Patient ID: Debra Phillips, female    DOB: 1929/03/26, 82 y.o.   MRN: [id number]  HPI: OMAH Phillips is a 82 y.o. female presenting on 07/11/2016 for Ear Pain (stopped up ears ) and odor to urine This patient has a history of recurrent urinary tract infection. However she is able to give a urine sample at this time. She is currently having ear congestion and sinus pressure. She denies any fever or chills. She is not having any significant pain over her bladder or dysuria after urinating. She has had congestion over the past week. She has tried to keep her head clear congestion but the pressure has built up in her ear is quite stopped up with some decreased hearing.  Relevant past medical, surgical, family and social history reviewed and updated as indicated. Allergies and medications reviewed and updated.  Past Medical History:  Diagnosis Date  . Abnormality of gait 01/29/2016  . Aortic root dilatation (HCC)    Mild  . Breast cyst    Right  . Cataract   . Diabetes mellitus without complication ([place])   . Dyslipidemia   . Esophageal stricture    s/p dilation  . GERD (gastroesophageal reflux disease)   . Hyperlipidemia   . Hypertension   . Macular pucker, left eye   . Osteoarthritis   . Osteoporosis     Past Surgical History:  Procedure Laterality Date  . ABDOMINAL HYSTERECTOMY    . APPENDECTOMY    . BACK SURGERY    . CARDIAC CATHETERIZATION  2010   both cataracts  . CHOLECYSTECTOMY  1993  . EYE SURGERY     cataracts  . EYE SURGERY     for macular pucker  . FRACTURE SURGERY     rt wrist2010  . HARDWARE REMOVAL  12/10/2011   Procedure: HARDWARE REMOVAL;  Surgeon: Schuyler Amor, MD;  Location: [facility];  Service: Orthopedics;  Laterality: Right;  radial head  . KNEE ARTHROSCOPY  2004   Left and right  .  KYPHOSIS SURGERY    . LUMBAR FUSION  [id number]  . PARTIAL HYSTERECTOMY    . RADIAL HEAD ARTHROPLASTY  5/12   orif rt radial head  . SHOULDER SURGERY     Right  . TOTAL KNEE ARTHROPLASTY     Left    Review of Systems  Constitutional: Negative.  Negative for activity change, fatigue and fever.  HENT: Positive for congestion, ear pain, hearing loss, postnasal drip and sore throat.   Eyes: Negative.   Respiratory: Negative.  Negative for cough.   Cardiovascular: Negative.  Negative for chest pain.  Gastrointestinal: Negative.  Negative for abdominal pain.  Endocrine: Negative.   Genitourinary: Negative.  Negative for decreased urine volume, difficulty urinating, dysuria, frequency and urgency.  Musculoskeletal: Negative.   Skin: Negative.   Neurological: Negative.       Medication List       Accurate as of 07/11/16 11:59 PM. Always use your most recent med list.          Calcium Carb-Cholecalciferol 600-500 MG-UNIT Caps Take 1 capsule by mouth every morning.   cefdinir 300 MG capsule Commonly known as:  OMNICEF Take 1 capsule (300 mg total) by mouth 2 (two) times  daily. 1 po BID   Cholecalciferol 1000 units tablet Take 1,000 Units by mouth daily.   CINNAMON PO Take 1 tablet by mouth daily.   diltiazem 180 MG 24 hr capsule Commonly known as:  CARDIZEM CD TAKE (1) CAPSULE DAILY   ESTRACE VAGINAL 0.1 MG/GM vaginal cream Generic drug:  estradiol Place 1 Applicatorful vaginally 3 (three) times a week.   Fish Oil 1000 MG Caps Take 2,000 mg by mouth at bedtime.   fluticasone 50 MCG/ACT nasal spray Commonly known as:  FLONASE Place 2 sprays into both nostrils daily.   furosemide 20 MG tablet Commonly known as:  LASIX Take 3 tablets (60 mg total) by mouth daily.   ICAPS AREDS 2 PO Take 1 capsule by mouth 2 (two) times daily.   JOBST ACTIVE 20-30MMHG MEDIUM Misc Wear daily to support circulation and prevent swelling   metFORMIN 500 MG tablet Commonly known  as:  GLUCOPHAGE Take 1 tablet (500 mg total) by mouth 2 (two) times daily.   MYRBETRIQ 25 MG Tb24 tablet Generic drug:  mirabegron ER   nitroGLYCERIN 0.4 MG SL tablet Commonly known as:  NITROSTAT Place 1 tablet (0.4 mg total) under the tongue every 5 (five) minutes as needed for chest pain.   omeprazole 40 MG capsule Commonly known as:  PRILOSEC Take 1 capsule (40 mg total) by mouth 2 (two) times daily.   potassium chloride 10 MEQ tablet Commonly known as:  K-DUR TAKE (1) TABLET DAILY AS DIRECTED.   PROBIOTIC DAILY PO Take 1 capsule by mouth daily.   traMADol 50 MG tablet Commonly known as:  ULTRAM Take 50 mg by mouth at bedtime as needed for moderate pain.          Objective:    BP 139/79   Pulse 81   Temp (!) 96.8 F (36 C) (Oral)   Ht 5\' 3"  (1.6 m)   Wt 166 lb (75.3 kg)   BMI 29.41 kg/m   Allergies  Allergen Reactions  . Codeine Nausea And Vomiting  . Doxycycline Hives  . Other Nausea Only    Almost all antibiotics  . Sulfonamide Derivatives Hives  . Vancomycin Itching    Physical Exam  Constitutional: She is oriented to person, place, and time. She appears well-developed and well-nourished.  HENT:  Head: Normocephalic and atraumatic.  Right Ear: No drainage. Tympanic membrane is not erythematous. A middle ear effusion is present.  Left Ear: No drainage. Tympanic membrane is not erythematous. A middle ear effusion is present.  Nose: Mucosal edema present. Right sinus exhibits no frontal sinus tenderness. Left sinus exhibits no frontal sinus tenderness.  Mouth/Throat: Posterior oropharyngeal erythema present. No oropharyngeal exudate or tonsillar abscesses.  Eyes: Conjunctivae and EOM are normal. Pupils are equal, round, and reactive to light.  Neck: Normal range of motion.  Cardiovascular: Normal rate, regular rhythm, normal heart sounds and intact distal pulses.   Pulmonary/Chest: Effort normal and breath sounds normal.  Abdominal: Soft. Bowel sounds  are normal.  Neurological: She is alert and oriented to person, place, and time. She has normal reflexes.  Skin: Skin is warm and dry. No rash noted.  Psychiatric: She has a normal mood and affect. Her behavior is normal. Judgment and thought content normal.  Nursing note and vitals reviewed.       Assessment & Plan:   1. Dysuria - MYRBETRIQ 25 MG TB24 tablet;  - Urinalysis, Complete - Urine culture  2. Bilateral acute serous otitis media, recurrence not specified -  fluticasone (FLONASE) 50 MCG/ACT nasal spray; Place 2 sprays into both nostrils daily.  Dispense: 16 g; Refill: 6 - cefdinir (OMNICEF) 300 MG capsule; Take 1 capsule (300 mg total) by mouth 2 (two) times daily. 1 po BID  Dispense: 20 capsule; Refill: 0  3. Acute cystitis without hematuria - cefdinir (OMNICEF) 300 MG capsule; Take 1 capsule (300 mg total) by mouth 2 (two) times daily. 1 po BID  Dispense: 20 capsule; Refill: 0   Continue all other maintenance medications as listed above.  Follow up plan: Return if symptoms worsen or fail to improve.  Orders Placed This Encounter  Procedures  . Urine culture  . Urinalysis, Complete    Educational handout given for Serous otitis media  Terald Sleeper PA-C [facility] [address]  [place], [place] [address] [phone]   07/13/2016, 7:41 PM

## 2016-07-14 ENCOUNTER — Other Ambulatory Visit: Payer: Self-pay | Admitting: Urology

## 2016-08-04 ENCOUNTER — Ambulatory Visit (INDEPENDENT_AMBULATORY_CARE_PROVIDER_SITE_OTHER): Payer: Medicare Other | Admitting: Family Medicine

## 2016-08-04 ENCOUNTER — Encounter: Payer: Self-pay | Admitting: Family Medicine

## 2016-08-04 VITALS — BP 153/74 | HR 73 | Temp 97.0°F | Ht 63.0 in | Wt 171.0 lb

## 2016-08-04 DIAGNOSIS — I2510 Atherosclerotic heart disease of native coronary artery without angina pectoris: Secondary | ICD-10-CM | POA: Diagnosis not present

## 2016-08-04 DIAGNOSIS — H9202 Otalgia, left ear: Secondary | ICD-10-CM

## 2016-08-04 DIAGNOSIS — H9312 Tinnitus, left ear: Secondary | ICD-10-CM | POA: Diagnosis not present

## 2016-08-04 MED ORDER — LISINOPRIL-HYDROCHLOROTHIAZIDE 10-12.5 MG PO TABS: 2.0000 | ORAL_TABLET | Freq: Every day | ORAL | 3 refills | Status: DC

## 2016-08-04 MED ORDER — BETAMETHASONE SOD PHOS & ACET 6 (3-3) MG/ML IJ SUSP
6.0000 mg | Freq: Once | INTRAMUSCULAR | Status: AC
  Administered 2016-08-04: 6 mg via INTRAMUSCULAR

## 2016-08-04 NOTE — Progress Notes (Signed)
Subjective:  Patient ID: Debra Phillips, female    DOB: 1929/06/29  Age: 82 y.o. MRN: [id number]  CC: left ear pain ("roaring" finished antibio. +HA)   HPI Debra Phillips presents for has a roaring and decreased hearing in left ear. Ongoing since before last visit. No relief with omnicef.   History Debra Phillips has a past medical history of Abnormality of gait (01/29/2016); Aortic root dilatation ([place]); Breast cyst; Cataract; Diabetes mellitus without complication ([place]); Dyslipidemia; Esophageal stricture; GERD (gastroesophageal reflux disease); Hyperlipidemia; Hypertension; Macular pucker, left eye; Osteoarthritis; and Osteoporosis.   She has a past surgical history that includes Partial hysterectomy; Total knee arthroplasty; Shoulder surgery; Knee arthroscopy (2004); Lumbar fusion (2000,2003); Radial head arthroplasty (5/12); Back surgery; Abdominal hysterectomy; Appendectomy; Cholecystectomy (1993); Cardiac catheterization (2010); Fracture surgery; Kyphosis surgery; Hardware Removal (12/10/2011); Eye surgery; and Eye surgery.   Her family history includes Arthritis in her sister; Brain cancer in her father; COPD in her brother and sister; Diabetes in her other; Emphysema in her brother; Heart disease in her brother; Liver cancer in her brother; Lung cancer in her brother; Pneumonia in her mother; Tremor in her brother and father.She reports that she has never smoked. She has never used smokeless tobacco. She reports that she does not drink alcohol or use drugs.    ROS Review of Systems  Constitutional: Negative for activity change, appetite change and fever.  HENT: Negative for congestion, ear discharge, ear pain, rhinorrhea and sore throat.   Eyes: Negative for visual disturbance.  Respiratory: Negative for cough and shortness of breath.   Cardiovascular: Negative for chest pain and palpitations.  Gastrointestinal: Negative for abdominal pain, diarrhea and nausea.  Genitourinary:  Negative for dysuria.  Musculoskeletal: Negative for arthralgias and myalgias.    Objective:  BP (!) 153/74 (BP Location: Left Arm)   Pulse 73   Temp 97 F (36.1 C) (Oral)   Ht 5\' 3"  (1.6 m)   Wt 171 lb (77.6 kg)   BMI 30.29 kg/m   BP Readings from Last 3 Encounters:  08/04/16 (!) 153/74  07/11/16 139/79  06/17/16 128/82    Wt Readings from Last 3 Encounters:  08/04/16 171 lb (77.6 kg)  07/11/16 166 lb (75.3 kg)  07/08/16 168 lb (76.2 kg)     Physical Exam  Constitutional: She is oriented to person, place, and time. She appears well-developed and well-nourished. No distress.  HENT:  Head: Normocephalic and atraumatic.  Eyes: Conjunctivae are normal. Pupils are equal, round, and reactive to light.  Neck: Normal range of motion. Neck supple. No thyromegaly present.  Cardiovascular: Normal rate, regular rhythm and normal heart sounds.   No murmur heard. Pulmonary/Chest: Effort normal and breath sounds normal. No respiratory distress. She has no wheezes. She has no rales.  Abdominal: Soft. Bowel sounds are normal. She exhibits no distension. There is no tenderness.  Musculoskeletal: Normal range of motion.  Lymphadenopathy:    She has no cervical adenopathy.  Neurological: She is alert and oriented to person, place, and time.  Skin: Skin is warm and dry.  Psychiatric: She has a normal mood and affect. Her behavior is normal. Judgment and thought content normal.     Lab Results  Component Value Date   WBC 6.3 06/13/2016   HGB 12.4 01/01/2015   HCT 39.3 06/13/2016   PLT 338 06/13/2016   GLUCOSE 160 (H) 06/13/2016   CHOL 208 (H) 06/13/2016   TRIG 216 (H) 06/13/2016   HDL 42 06/13/2016   LDLCALC 123 (  H) 06/13/2016   ALT 11 05/20/2016   AST 16 05/20/2016   NA 140 06/13/2016   K 5.3 (H) 06/13/2016   CL 98 06/13/2016   CREATININE 0.70 06/13/2016   BUN 12 06/13/2016   CO2 25 06/13/2016   TSH 2.000 01/14/2016   HGBA1C 7.0 08/30/2015   MICROALBUR NEGATIVE  09/01/2014    No results found.  Assessment & Plan:   Debra Phillips was seen today for left ear pain.  Diagnoses and all orders for this visit:  Left ear pain  Tinnitus of left ear -     betamethasone acetate-betamethasone sodium phosphate (CELESTONE) injection 6 mg; Inject 1 mL (6 mg total) into the muscle once.  Other orders -     lisinopril-hydrochlorothiazide (PRINZIDE,ZESTORETIC) 10-12.5 MG tablet; Take 2 tablets by mouth daily. For blood pressure  Tympanogram report showed type a pattern bilaterally. No sign of effusion.  There is a temporal association between the use of furosemide 3 times a day with the tinnitus. Although she has cut back on the furosemide I'm concerned that this may actually have started the tinnitus as a side effect. Therefore she was discontinued from furosemide and started on lisinopril as noted above.  I have discontinued Debra Phillips's furosemide and cefdinir. I am also having her start on lisinopril-hydrochlorothiazide. Additionally, I am having her maintain her traMADol, Cholecalciferol, Fish Oil, Calcium Carb-Cholecalciferol, Probiotic Product (PROBIOTIC DAILY PO), Multiple Vitamins-Minerals (ICAPS AREDS 2 PO), omeprazole, nitroGLYCERIN, ESTRACE VAGINAL, JOBST ACTIVE 20-30MMHG MEDIUM, potassium chloride, CINNAMON PO, diltiazem, metFORMIN, MYRBETRIQ, and fluticasone. We administered betamethasone acetate-betamethasone sodium phosphate.  Meds ordered this encounter  Medications  . lisinopril-hydrochlorothiazide (PRINZIDE,ZESTORETIC) 10-12.5 MG tablet    Sig: Take 2 tablets by mouth daily. For blood pressure    Dispense:  180 tablet    Refill:  3  . betamethasone acetate-betamethasone sodium phosphate (CELESTONE) injection 6 mg     Follow-up: Return in about 3 months (around 11/02/2016).  Claretta Fraise, M.D.

## 2016-08-19 ENCOUNTER — Other Ambulatory Visit: Payer: Self-pay | Admitting: Family Medicine

## 2016-08-20 NOTE — Patient Instructions (Signed)
Debra Phillips  08/20/2016   Your procedure is scheduled on: Thursday 08/28/2016  Report to [facility] Main  Entrance take [place]  elevators to 3rd floor to  [place] at [phone].  Call this number if you have problems the morning of surgery [phone]   Remember: ONLY 1 PERSON MAY GO WITH YOU TO SHORT STAY TO GET  READY MORNING OF [place].  Do not eat food or drink liquids :After Midnight.     Take these medicines the morning of surgery with A SIP OF WATER: Tylenol if needed, Dilitazem(Cardizem CD), Myrbetriq, Omeprazole ( Prilosec)  DO NOT TAKE ANY DIABETIC MEDICATIONS DAY OF YOUR SURGERY                               You may not have any metal on your body including hair pins and              piercings  Do not wear jewelry, make-up, lotions, powders or perfumes, deodorant             Do not wear nail polish.  Do not shave  48 hours prior to surgery.              Men may shave face and neck.   Do not bring valuables to the hospital. [employer].  Contacts, dentures or bridgework may not be worn into surgery.  Leave suitcase in the car. After surgery it may be brought to your room.                  Please read over the following fact sheets you were given: _____________________________________________________________________             How to Manage Your Diabetes Before and After Surgery  Why is it important to control my blood sugar before and after surgery? . Improving blood sugar levels before and after surgery helps healing and can limit problems. . A way of improving blood sugar control is eating a healthy diet by: o  Eating less sugar and carbohydrates o  Increasing activity/exercise o  Talking with your doctor about reaching your blood sugar goals . High blood sugars (greater than 180 mg/dL) can raise your risk of infections and slow your recovery, so you will need to focus on  controlling your diabetes during the weeks before surgery. . Make sure that the doctor who takes care of your diabetes knows about your planned surgery including the date and location.  How do I manage my blood sugar before surgery? . Check your blood sugar at least 4 times a day, starting 2 days before surgery, to make sure that the level is not too high or low. o Check your blood sugar the morning of your surgery when you wake up and every 2 hours until you get to the Short Stay unit. . If your blood sugar is less than 70 mg/dL, you will need to treat for low blood sugar: o Do not take insulin. o Treat a low blood sugar (less than 70 mg/dL) with  cup of clear juice (cranberry or apple), 4 glucose tablets, OR glucose gel. o Recheck blood sugar in 15 minutes after treatment (to make sure it is greater  than 70 mg/dL). If your blood sugar is not greater than 70 mg/dL on recheck, call [phone] for further instructions. . Report your blood sugar to the short stay nurse when you get to Short Stay.  . If you are admitted to the hospital after surgery: o Your blood sugar will be checked by the staff and you will probably be given insulin after surgery (instead of oral diabetes medicines) to make sure you have good blood sugar levels. o The goal for blood sugar control after surgery is 80-180 mg/dL.   WHAT DO I DO ABOUT MY DIABETES MEDICATION?  Marland Kitchen Do not take oral diabetes medicines (pills) the morning of surgery.  . You may take your Metformin day before surgery on 08/27/2016     Patient Signature:  Date:   Nurse Signature:  Date:   Reviewed and Endorsed by [facility] Patient Education Committee, August 2015[facility] - Preparing for Surgery Before surgery, you can play an important role.  Because skin is not sterile, your skin needs to be as free of germs as possible.  You can reduce the number of germs on your skin by washing with CHG (chlorahexidine gluconate) soap before surgery.  CHG  is an antiseptic cleaner which kills germs and bonds with the skin to continue killing germs even after washing. Please DO NOT use if you have an allergy to CHG or antibacterial soaps.  If your skin becomes reddened/irritated stop using the CHG and inform your nurse when you arrive at Short Stay. Do not shave (including legs and underarms) for at least 48 hours prior to the first CHG shower.  You may shave your face/neck. Please follow these instructions carefully:  1.  Shower with CHG Soap the night before surgery and the  morning of Surgery.  2.  If you choose to wash your hair, wash your hair first as usual with your  normal  shampoo.  3.  After you shampoo, rinse your hair and body thoroughly to remove the  shampoo.                           4.  Use CHG as you would any other liquid soap.  You can apply chg directly  to the skin and wash                       Gently with a scrungie or clean washcloth.  5.  Apply the CHG Soap to your body ONLY FROM THE NECK DOWN.   Do not use on face/ open                           Wound or open sores. Avoid contact with eyes, ears mouth and genitals (private parts).                       Wash face,  Genitals (private parts) with your normal soap.             6.  Wash thoroughly, paying special attention to the area where your surgery  will be performed.  7.  Thoroughly rinse your body with warm water from the neck down.  8.  DO NOT shower/wash with your normal soap after using and rinsing off  the CHG Soap.                9.  Pat yourself dry  with a clean towel.            10.  Wear clean pajamas.            11.  Place clean sheets on your bed the night of your first shower and do not  sleep with pets. Day of Surgery : Do not apply any lotions/deodorants the morning of surgery.  Please wear clean clothes to the hospital/surgery center.    ________________________________________________________________________

## 2016-08-20 NOTE — Progress Notes (Addendum)
[place] cardiology Dr Percival Spanish 10/24//17 EPIC Stress test 07/08/16 EPIC CXR 07/14/16 EPIC ECHO 07/08/16 EPIC EKG 06/17/16 EPIC Dr Gaynelle Arabian clearing patient for surgery 07/10/16 EPIC note on chart Dr Cherlyn Cushing notes indicate no significant findings no further work up needed 07/09/16

## 2016-08-21 ENCOUNTER — Encounter (HOSPITAL_COMMUNITY)
Admission: RE | Admit: 2016-08-21 | Discharge: 2016-08-21 | Disposition: A | Discharge status: Home / Self Care | Payer: Medicare Other | Source: Ambulatory Visit | Attending: Urology | Admitting: Urology

## 2016-08-21 ENCOUNTER — Encounter (HOSPITAL_COMMUNITY): Payer: Self-pay | Admitting: *Deleted

## 2016-08-21 DIAGNOSIS — N8189 Other female genital prolapse: Secondary | ICD-10-CM | POA: Diagnosis not present

## 2016-08-21 DIAGNOSIS — Z01812 Encounter for preprocedural laboratory examination: Secondary | ICD-10-CM | POA: Diagnosis not present

## 2016-08-21 HISTORY — DX: Personal history of other diseases of the digestive system: Z87.19

## 2016-08-21 HISTORY — DX: Tinnitus, unspecified ear: H93.19

## 2016-08-21 HISTORY — DX: Cardiac arrhythmia, unspecified: I49.9

## 2016-08-21 HISTORY — DX: Dyspnea, unspecified: R06.00

## 2016-08-21 HISTORY — DX: Headache: R51

## 2016-08-21 HISTORY — DX: Headache, unspecified: R51.9

## 2016-08-21 LAB — BASIC METABOLIC PANEL
Anion gap: 11 (ref 5–15)
BUN: 15 mg/dL (ref 6–20)
CALCIUM: 9.8 mg/dL (ref 8.9–10.3)
CHLORIDE: 97 mmol/L — AB (ref 101–111)
CO2: 29 mmol/L (ref 22–32)
CREATININE: 0.69 mg/dL (ref 0.44–1.00)
GFR calc Af Amer: >60 mL/min (ref >60)
GFR calc non Af Amer: >60 mL/min (ref >60)
GLUCOSE: 145 mg/dL — AB (ref 65–99)
Potassium: 4.5 mmol/L (ref 3.5–5.1)
Sodium: 137 mmol/L (ref 135–145)

## 2016-08-21 LAB — GLUCOSE, CAPILLARY: Glucose-Capillary: 144 mg/dL — ABNORMAL HIGH (ref 65–99)

## 2016-08-21 LAB — CBC
HCT: 38.9 % (ref 36.0–46.0)
HEMOGLOBIN: 13 g/dL (ref 12.0–15.0)
MCH: 30.5 pg (ref 26.0–34.0)
MCHC: 33.4 g/dL (ref 30.0–36.0)
MCV: 91.3 fL (ref 78.0–100.0)
PLATELETS: 309 10*3/uL (ref 150–400)
RBC: 4.26 MIL/uL (ref 3.87–5.11)
RDW: 13.2 % (ref 11.5–15.5)
WBC: 9.5 10*3/uL (ref 4.0–10.5)

## 2016-08-22 LAB — HEMOGLOBIN A1C
Hgb A1c MFr Bld: 7.1 % — ABNORMAL HIGH (ref 4.8–5.6)
Mean Plasma Glucose: 157 mg/dL

## 2016-08-25 ENCOUNTER — Encounter (HOSPITAL_COMMUNITY): Payer: Self-pay | Admitting: Urology

## 2016-08-28 ENCOUNTER — Observation Stay (HOSPITAL_COMMUNITY)
Admission: RE | Admit: 2016-08-28 | Discharge: 2016-08-29 | Disposition: A | Discharge status: Home / Self Care | Payer: Medicare Other | Source: Ambulatory Visit | Attending: Urology | Admitting: Urology

## 2016-08-28 ENCOUNTER — Ambulatory Visit (HOSPITAL_COMMUNITY): Payer: Medicare Other | Admitting: Anesthesiology

## 2016-08-28 ENCOUNTER — Encounter (HOSPITAL_COMMUNITY)
Admission: RE | Disposition: A | Discharge status: Home / Self Care | Payer: Self-pay | Source: Ambulatory Visit | Attending: Urology

## 2016-08-28 ENCOUNTER — Encounter (HOSPITAL_COMMUNITY): Payer: Self-pay | Admitting: *Deleted

## 2016-08-28 DIAGNOSIS — Z885 Allergy status to narcotic agent status: Secondary | ICD-10-CM | POA: Insufficient documentation

## 2016-08-28 DIAGNOSIS — N8111 Cystocele, midline: Secondary | ICD-10-CM

## 2016-08-28 DIAGNOSIS — N816 Rectocele: Secondary | ICD-10-CM | POA: Insufficient documentation

## 2016-08-28 DIAGNOSIS — N323 Diverticulum of bladder: Secondary | ICD-10-CM | POA: Diagnosis not present

## 2016-08-28 DIAGNOSIS — Z881 Allergy status to other antibiotic agents status: Secondary | ICD-10-CM | POA: Insufficient documentation

## 2016-08-28 DIAGNOSIS — Z888 Allergy status to other drugs, medicaments and biological substances status: Secondary | ICD-10-CM | POA: Diagnosis not present

## 2016-08-28 DIAGNOSIS — N819 Female genital prolapse, unspecified: Secondary | ICD-10-CM | POA: Diagnosis not present

## 2016-08-28 DIAGNOSIS — N3941 Urge incontinence: Secondary | ICD-10-CM | POA: Insufficient documentation

## 2016-08-28 DIAGNOSIS — N8110 Cystocele, unspecified: Secondary | ICD-10-CM | POA: Insufficient documentation

## 2016-08-28 DIAGNOSIS — I10 Essential (primary) hypertension: Secondary | ICD-10-CM | POA: Diagnosis not present

## 2016-08-28 DIAGNOSIS — Z7984 Long term (current) use of oral hypoglycemic drugs: Secondary | ICD-10-CM | POA: Insufficient documentation

## 2016-08-28 DIAGNOSIS — Z90710 Acquired absence of both cervix and uterus: Secondary | ICD-10-CM | POA: Insufficient documentation

## 2016-08-28 DIAGNOSIS — N952 Postmenopausal atrophic vaginitis: Secondary | ICD-10-CM | POA: Diagnosis not present

## 2016-08-28 DIAGNOSIS — Z7982 Long term (current) use of aspirin: Secondary | ICD-10-CM | POA: Insufficient documentation

## 2016-08-28 DIAGNOSIS — N8189 Other female genital prolapse: Secondary | ICD-10-CM | POA: Diagnosis not present

## 2016-08-28 DIAGNOSIS — Z882 Allergy status to sulfonamides status: Secondary | ICD-10-CM | POA: Insufficient documentation

## 2016-08-28 DIAGNOSIS — K219 Gastro-esophageal reflux disease without esophagitis: Secondary | ICD-10-CM | POA: Insufficient documentation

## 2016-08-28 DIAGNOSIS — E1151 Type 2 diabetes mellitus with diabetic peripheral angiopathy without gangrene: Secondary | ICD-10-CM | POA: Diagnosis not present

## 2016-08-28 DIAGNOSIS — E785 Hyperlipidemia, unspecified: Secondary | ICD-10-CM | POA: Diagnosis not present

## 2016-08-28 HISTORY — PX: VAGINAL PROLAPSE REPAIR: SHX830

## 2016-08-28 HISTORY — PX: CYSTOSCOPY: SHX5120

## 2016-08-28 LAB — GLUCOSE, CAPILLARY
GLUCOSE-CAPILLARY: 173 mg/dL — AB (ref 65–99)
Glucose-Capillary: 153 mg/dL — ABNORMAL HIGH (ref 65–99)
Glucose-Capillary: 296 mg/dL — ABNORMAL HIGH (ref 65–99)

## 2016-08-28 SURGERY — SUSPENSION, VAGINAL VAULT
Anesthesia: General

## 2016-08-28 MED ORDER — FLUORESCEIN SODIUM 10 % IV SOLN
INTRAVENOUS | Status: AC
  Filled 2016-08-28: qty 5

## 2016-08-28 MED ORDER — SUGAMMADEX SODIUM 200 MG/2ML IV SOLN
INTRAVENOUS | Status: AC
  Filled 2016-08-28: qty 2

## 2016-08-28 MED ORDER — CINNAMON 500 MG PO CAPS: ORAL_CAPSULE | Freq: Every day | ORAL | Status: DC

## 2016-08-28 MED ORDER — FENTANYL CITRATE (PF) 100 MCG/2ML IJ SOLN
INTRAMUSCULAR | Status: AC
  Filled 2016-08-28: qty 2

## 2016-08-28 MED ORDER — FUROSEMIDE 20 MG PO TABS
60.0000 mg | ORAL_TABLET | Freq: Every day | ORAL | Status: DC
  Administered 2016-08-29: 08:00:00 60 mg via ORAL
  Filled 2016-08-28: qty 1

## 2016-08-28 MED ORDER — PROPOFOL 10 MG/ML IV BOLUS
INTRAVENOUS | Status: DC | PRN
  Administered 2016-08-28: 80 mg via INTRAVENOUS

## 2016-08-28 MED ORDER — LACTATED RINGERS IV SOLN
INTRAVENOUS | Status: DC
  Administered 2016-08-28 (×2): via INTRAVENOUS

## 2016-08-28 MED ORDER — FLUTICASONE PROPIONATE 50 MCG/ACT NA SUSP
2.0000 | Freq: Every day | NASAL | Status: DC
  Administered 2016-08-29: 2 via NASAL
  Filled 2016-08-28: qty 16

## 2016-08-28 MED ORDER — POLYVINYL ALCOHOL 1.4 % OP SOLN
1.0000 [drp] | OPHTHALMIC | Status: DC | PRN
Start: 2016-08-28 — End: 2016-08-29
  Filled 2016-08-28: qty 15

## 2016-08-28 MED ORDER — LISINOPRIL-HYDROCHLOROTHIAZIDE 20-12.5 MG PO TABS: 1.0000 | ORAL_TABLET | Freq: Every day | ORAL | Status: DC

## 2016-08-28 MED ORDER — BELLADONNA ALKALOIDS-OPIUM 16.2-60 MG RE SUPP
RECTAL | Status: DC | PRN
  Administered 2016-08-28: 1 via RECTAL

## 2016-08-28 MED ORDER — HYDROMORPHONE HCL 1 MG/ML IJ SOLN
0.2500 mg | INTRAMUSCULAR | Status: DC | PRN
  Administered 2016-08-28 (×2): 0.25 mg via INTRAVENOUS

## 2016-08-28 MED ORDER — SODIUM CHLORIDE 3 % IN NEBU
INHALATION_SOLUTION | RESPIRATORY_TRACT | Status: AC
  Filled 2016-08-28: qty 30

## 2016-08-28 MED ORDER — BELLADONNA ALKALOIDS-OPIUM 16.2-60 MG RE SUPP
RECTAL | Status: AC
  Filled 2016-08-28: qty 1

## 2016-08-28 MED ORDER — EPHEDRINE SULFATE 50 MG/ML IJ SOLN
INTRAMUSCULAR | Status: DC | PRN
  Administered 2016-08-28: 5 mg via INTRAVENOUS

## 2016-08-28 MED ORDER — KETOROLAC TROMETHAMINE 30 MG/ML IJ SOLN
INTRAMUSCULAR | Status: DC | PRN
  Administered 2016-08-28: 15 mg via INTRAVENOUS

## 2016-08-28 MED ORDER — BUPIVACAINE HCL (PF) 0.5 % IJ SOLN
INTRAMUSCULAR | Status: AC
  Filled 2016-08-28: qty 30

## 2016-08-28 MED ORDER — EPHEDRINE 5 MG/ML INJ
INTRAVENOUS | Status: AC
  Filled 2016-08-28: qty 10

## 2016-08-28 MED ORDER — POTASSIUM CHLORIDE ER 10 MEQ PO TBCR
10.0000 meq | EXTENDED_RELEASE_TABLET | Freq: Every day | ORAL | Status: DC
  Administered 2016-08-28 – 2016-08-29 (×2): 10 meq via ORAL
  Filled 2016-08-28 (×4): qty 1

## 2016-08-28 MED ORDER — ZOLPIDEM TARTRATE 5 MG PO TABS
5.0000 mg | ORAL_TABLET | Freq: Every evening | ORAL | Status: DC | PRN
  Administered 2016-08-29: 5 mg via ORAL
  Filled 2016-08-28: qty 1

## 2016-08-28 MED ORDER — ESTRADIOL 0.1 MG/GM VA CREA
1.0000 | TOPICAL_CREAM | VAGINAL | Status: DC
  Filled 2016-08-28: qty 42.5

## 2016-08-28 MED ORDER — ROCURONIUM BROMIDE 50 MG/5ML IV SOSY
PREFILLED_SYRINGE | INTRAVENOUS | Status: AC
  Filled 2016-08-28: qty 5

## 2016-08-28 MED ORDER — NITROGLYCERIN 0.4 MG SL SUBL: 0.4000 mg | SUBLINGUAL_TABLET | SUBLINGUAL | Status: DC | PRN

## 2016-08-28 MED ORDER — SODIUM CHLORIDE 0.9 % IR SOLN
Status: AC
  Filled 2016-08-28: qty 500000

## 2016-08-28 MED ORDER — FUROSEMIDE 20 MG PO TABS: 20.0000 mg | ORAL_TABLET | Freq: Every day | ORAL | Status: DC | PRN

## 2016-08-28 MED ORDER — FENTANYL CITRATE (PF) 100 MCG/2ML IJ SOLN
INTRAMUSCULAR | Status: DC | PRN
  Administered 2016-08-28 (×6): 25 ug via INTRAVENOUS

## 2016-08-28 MED ORDER — LIDOCAINE-EPINEPHRINE 0.5 %-1:200000 IJ SOLN
INTRAMUSCULAR | Status: AC
  Filled 2016-08-28: qty 1

## 2016-08-28 MED ORDER — ACETAMINOPHEN ER 650 MG PO TBCR: 1300.0000 mg | EXTENDED_RELEASE_TABLET | Freq: Three times a day (TID) | ORAL | Status: DC | PRN

## 2016-08-28 MED ORDER — STERILE WATER FOR IRRIGATION IR SOLN
Status: DC | PRN
  Administered 2016-08-28: 3000 mL via INTRAVESICAL

## 2016-08-28 MED ORDER — MIRABEGRON ER 25 MG PO TB24
25.0000 mg | ORAL_TABLET | Freq: Every day | ORAL | Status: DC
  Administered 2016-08-29: 25 mg via ORAL
  Filled 2016-08-28: qty 1

## 2016-08-28 MED ORDER — SODIUM CHLORIDE 0.9 % IJ SOLN
INTRAMUSCULAR | Status: AC
  Filled 2016-08-28: qty 100

## 2016-08-28 MED ORDER — JOBST ACTIVE 20-30MMHG MEDIUM MISC: 20.0000 [IU]/min | Status: DC

## 2016-08-28 MED ORDER — METFORMIN HCL 500 MG PO TABS
500.0000 mg | ORAL_TABLET | Freq: Every day | ORAL | Status: DC
  Administered 2016-08-29: 500 mg via ORAL
  Filled 2016-08-28: qty 1

## 2016-08-28 MED ORDER — SUGAMMADEX SODIUM 200 MG/2ML IV SOLN
INTRAVENOUS | Status: DC | PRN
  Administered 2016-08-28: 150 mg via INTRAVENOUS

## 2016-08-28 MED ORDER — CALCIUM CARBONATE-VITAMIN D 500-200 MG-UNIT PO TABS
1.0000 | ORAL_TABLET | Freq: Every morning | ORAL | Status: DC
  Administered 2016-08-28 – 2016-08-29 (×2): 1 via ORAL
  Filled 2016-08-28 (×2): qty 1

## 2016-08-28 MED ORDER — SODIUM CHLORIDE 0.9 % IR SOLN
Status: DC | PRN
  Administered 2016-08-28: 500 mL

## 2016-08-28 MED ORDER — SUCCINYLCHOLINE CHLORIDE 200 MG/10ML IV SOSY
PREFILLED_SYRINGE | INTRAVENOUS | Status: AC
  Filled 2016-08-28: qty 10

## 2016-08-28 MED ORDER — ESTRADIOL 0.1 MG/GM VA CREA
TOPICAL_CREAM | VAGINAL | Status: AC
  Filled 2016-08-28: qty 42.5

## 2016-08-28 MED ORDER — LIDOCAINE-EPINEPHRINE 0.5 %-1:200000 IJ SOLN
INTRAMUSCULAR | Status: DC | PRN
  Administered 2016-08-28: 30 mL

## 2016-08-28 MED ORDER — PANTOPRAZOLE SODIUM 40 MG PO TBEC
40.0000 mg | DELAYED_RELEASE_TABLET | Freq: Every day | ORAL | Status: DC
  Administered 2016-08-29: 40 mg via ORAL
  Filled 2016-08-28: qty 1

## 2016-08-28 MED ORDER — PROPOFOL 10 MG/ML IV BOLUS
INTRAVENOUS | Status: AC
  Filled 2016-08-28: qty 20

## 2016-08-28 MED ORDER — OMEGA-3-ACID ETHYL ESTERS 1 G PO CAPS
1000.0000 mg | ORAL_CAPSULE | Freq: Every day | ORAL | Status: DC
  Administered 2016-08-28 – 2016-08-29 (×2): 1000 mg via ORAL
  Filled 2016-08-28 (×2): qty 1

## 2016-08-28 MED ORDER — LIDOCAINE-EPINEPHRINE (PF) 1 %-1:200000 IJ SOLN
INTRAMUSCULAR | Status: AC
  Filled 2016-08-28: qty 30

## 2016-08-28 MED ORDER — BACITRACIN ZINC 500 UNIT/GM EX OINT
TOPICAL_OINTMENT | CUTANEOUS | Status: AC
  Filled 2016-08-28: qty 28.35

## 2016-08-28 MED ORDER — RISAQUAD PO CAPS
1.0000 | ORAL_CAPSULE | Freq: Every day | ORAL | Status: DC
  Administered 2016-08-28 – 2016-08-29 (×2): 1 via ORAL
  Filled 2016-08-28 (×2): qty 1

## 2016-08-28 MED ORDER — DEXAMETHASONE SODIUM PHOSPHATE 10 MG/ML IJ SOLN
INTRAMUSCULAR | Status: DC | PRN
  Administered 2016-08-28: 5 mg via INTRAVENOUS

## 2016-08-28 MED ORDER — ONDANSETRON HCL 4 MG/2ML IJ SOLN: 4.0000 mg | INTRAMUSCULAR | Status: DC | PRN

## 2016-08-28 MED ORDER — LIDOCAINE 2% (20 MG/ML) 5 ML SYRINGE
INTRAMUSCULAR | Status: DC | PRN
  Administered 2016-08-28: 20 mg via INTRAVENOUS

## 2016-08-28 MED ORDER — CEFAZOLIN IN D5W 1 GM/50ML IV SOLN
1.0000 g | Freq: Three times a day (TID) | INTRAVENOUS | Status: AC
  Administered 2016-08-28 – 2016-08-29 (×2): 1 g via INTRAVENOUS
  Filled 2016-08-28 (×2): qty 50

## 2016-08-28 MED ORDER — METRONIDAZOLE 0.75 % VA GEL
1.0000 | Freq: Once | VAGINAL | Status: DC
  Filled 2016-08-28: qty 70

## 2016-08-28 MED ORDER — LISINOPRIL-HYDROCHLOROTHIAZIDE 10-12.5 MG PO TABS: 2.0000 | ORAL_TABLET | Freq: Every day | ORAL | Status: DC

## 2016-08-28 MED ORDER — ESTRADIOL 0.1 MG/GM VA CREA
TOPICAL_CREAM | VAGINAL | Status: DC | PRN
  Administered 2016-08-28: 1 via VAGINAL

## 2016-08-28 MED ORDER — ACETAMINOPHEN 500 MG PO TABS
1000.0000 mg | ORAL_TABLET | ORAL | Status: AC
  Administered 2016-08-28: 1000 mg via ORAL
  Filled 2016-08-28: qty 2

## 2016-08-28 MED ORDER — VITAMIN D3 25 MCG (1000 UNIT) PO TABS
1000.0000 [IU] | ORAL_TABLET | Freq: Every day | ORAL | Status: DC
  Administered 2016-08-28 – 2016-08-29 (×2): 1000 [IU] via ORAL
  Filled 2016-08-28 (×2): qty 1

## 2016-08-28 MED ORDER — CEFAZOLIN SODIUM-DEXTROSE 2-4 GM/100ML-% IV SOLN
2.0000 g | INTRAVENOUS | Status: AC
  Administered 2016-08-28: 2 g via INTRAVENOUS
  Filled 2016-08-28: qty 100

## 2016-08-28 MED ORDER — ONDANSETRON HCL 4 MG/2ML IJ SOLN
INTRAMUSCULAR | Status: AC
  Filled 2016-08-28: qty 2

## 2016-08-28 MED ORDER — DEXAMETHASONE SODIUM PHOSPHATE 10 MG/ML IJ SOLN
INTRAMUSCULAR | Status: AC
  Filled 2016-08-28: qty 1

## 2016-08-28 MED ORDER — DILTIAZEM HCL ER COATED BEADS 180 MG PO CP24
180.0000 mg | ORAL_CAPSULE | Freq: Every day | ORAL | Status: DC
  Administered 2016-08-29: 180 mg via ORAL
  Filled 2016-08-28: qty 1

## 2016-08-28 MED ORDER — ONDANSETRON HCL 4 MG/2ML IJ SOLN
INTRAMUSCULAR | Status: DC | PRN
  Administered 2016-08-28: 4 mg via INTRAVENOUS

## 2016-08-28 MED ORDER — ROCURONIUM BROMIDE 50 MG/5ML IV SOSY
PREFILLED_SYRINGE | INTRAVENOUS | Status: DC | PRN
Start: 2016-08-28 — End: 2016-08-28
  Administered 2016-08-28: 10 mg via INTRAVENOUS
  Administered 2016-08-28: 5 mg via INTRAVENOUS
  Administered 2016-08-28 (×2): 10 mg via INTRAVENOUS
  Administered 2016-08-28: 50 mg via INTRAVENOUS

## 2016-08-28 MED ORDER — CEFAZOLIN SODIUM-DEXTROSE 2-4 GM/100ML-% IV SOLN
INTRAVENOUS | Status: AC
  Filled 2016-08-28: qty 100

## 2016-08-28 MED ORDER — HYDROMORPHONE HCL 1 MG/ML IJ SOLN
INTRAMUSCULAR | Status: AC
  Administered 2016-08-28: 0.25 mg via INTRAVENOUS
  Filled 2016-08-28: qty 1

## 2016-08-28 MED ORDER — LISINOPRIL 20 MG PO TABS
20.0000 mg | ORAL_TABLET | Freq: Every day | ORAL | Status: DC
  Administered 2016-08-29: 20 mg via ORAL
  Filled 2016-08-28: qty 1

## 2016-08-28 MED ORDER — PROMETHAZINE HCL 25 MG/ML IJ SOLN: 6.2500 mg | INTRAMUSCULAR | Status: DC | PRN

## 2016-08-28 MED ORDER — KETOROLAC TROMETHAMINE 30 MG/ML IJ SOLN
INTRAMUSCULAR | Status: AC
  Filled 2016-08-28: qty 1

## 2016-08-28 MED ORDER — HYDROCHLOROTHIAZIDE 12.5 MG PO CAPS
12.5000 mg | ORAL_CAPSULE | Freq: Every day | ORAL | Status: DC
  Administered 2016-08-29: 12.5 mg via ORAL
  Filled 2016-08-28: qty 1

## 2016-08-28 MED ORDER — PROSIGHT PO TABS
1.0000 | ORAL_TABLET | Freq: Every day | ORAL | Status: DC
  Administered 2016-08-28 – 2016-08-29 (×2): 1 via ORAL
  Filled 2016-08-28 (×2): qty 1

## 2016-08-28 MED ORDER — DOCUSATE SODIUM 100 MG PO CAPS
100.0000 mg | ORAL_CAPSULE | Freq: Once | ORAL | Status: AC
  Administered 2016-08-28: 100 mg via ORAL
  Filled 2016-08-28: qty 1

## 2016-08-28 MED ORDER — TRAMADOL HCL 50 MG PO TABS
50.0000 mg | ORAL_TABLET | Freq: Every evening | ORAL | Status: DC | PRN
  Administered 2016-08-29: 50 mg via ORAL
  Filled 2016-08-28: qty 1

## 2016-08-28 MED ORDER — BISACODYL 10 MG RE SUPP: 10.0000 mg | Freq: Every day | RECTAL | Status: DC | PRN

## 2016-08-28 MED ORDER — FLUORESCEIN SODIUM 10 % IV SOLN
INTRAVENOUS | Status: DC | PRN
  Administered 2016-08-28: .5 mL via INTRAVENOUS

## 2016-08-28 MED ORDER — ACETAMINOPHEN 500 MG PO TABS
1000.0000 mg | ORAL_TABLET | Freq: Three times a day (TID) | ORAL | Status: DC | PRN
  Administered 2016-08-29: 1000 mg via ORAL
  Filled 2016-08-28: qty 2

## 2016-08-28 MED ORDER — LIDOCAINE 2% (20 MG/ML) 5 ML SYRINGE
INTRAMUSCULAR | Status: AC
  Filled 2016-08-28: qty 5

## 2016-08-28 SURGICAL SUPPLY — 55 items
ALLOGRAFT TIS AXIS TUTPLST 4X7 (Mesh General) ×1 IMPLANT
BAG URINE DRAINAGE (UROLOGICAL SUPPLIES) ×3 IMPLANT
BLADE SURG 15 STRL LF DISP TIS (BLADE) ×1 IMPLANT
BLADE SURG 15 STRL SS (BLADE) ×2
CATH FOLEY 2WAY SLVR  5CC 14FR (CATHETERS) ×2
CATH FOLEY 2WAY SLVR  5CC 16FR (CATHETERS)
CATH FOLEY 2WAY SLVR 5CC 14FR (CATHETERS) ×1 IMPLANT
CATH FOLEY 2WAY SLVR 5CC 16FR (CATHETERS) IMPLANT
COVER MAYO STAND STRL (DRAPES) IMPLANT
DEVICE CAPIO SLIM SINGLE (INSTRUMENTS) IMPLANT
ELECT PENCIL ROCKER SW 15FT (MISCELLANEOUS) ×3 IMPLANT
GAUZE SPONGE 4X4 16PLY XRAY LF (GAUZE/BANDAGES/DRESSINGS) ×6 IMPLANT
GLOVE BIOGEL M STRL SZ7.5 (GLOVE) ×6 IMPLANT
GOWN STRL REUS W/TWL LRG LVL3 (GOWN DISPOSABLE) ×6 IMPLANT
HOLDER FOLEY CATH W/STRAP (MISCELLANEOUS) ×3 IMPLANT
IV NS 1000ML (IV SOLUTION) ×2
IV NS 1000ML BAXH (IV SOLUTION) ×1 IMPLANT
KIT BASIN OR (CUSTOM PROCEDURE TRAY) ×3 IMPLANT
NEEDLE HYPO 22GX1.5 SAFETY (NEEDLE) IMPLANT
NEEDLE MAYO 6 CRC TAPER PT (NEEDLE) ×3 IMPLANT
NEEDLE SPNL 22GX3.5 QUINCKE BK (NEEDLE) ×3 IMPLANT
NEEDLE SPNL 22GX7 QUINCKE BK (NEEDLE) ×3 IMPLANT
NS IRRIG 1000ML POUR BTL (IV SOLUTION) ×3 IMPLANT
PACK CYSTO (CUSTOM PROCEDURE TRAY) ×3 IMPLANT
PACKING VAGINAL (PACKING) ×3 IMPLANT
PLUG CATH AND CAP STER (CATHETERS) ×3 IMPLANT
RETRACTOR LONRSTAR 16.6X16.6CM (MISCELLANEOUS) ×1 IMPLANT
RETRACTOR STAY HOOK 5MM (MISCELLANEOUS) ×3 IMPLANT
RETRACTOR STER APS 16.6X16.6CM (MISCELLANEOUS) ×3
SHEET LAVH (DRAPES) ×3 IMPLANT
SPONGE LAP 4X18 X RAY DECT (DISPOSABLE) ×3 IMPLANT
SUCTION FRAZIER HANDLE 12FR (TUBING) ×2
SUCTION TUBE FRAZIER 12FR DISP (TUBING) ×1 IMPLANT
SUT CAPIO ETHIBPND (SUTURE) ×6 IMPLANT
SUT MON AB 3-0 SH 27 (SUTURE) ×6
SUT MON AB 3-0 SH27 (SUTURE) ×3 IMPLANT
SUT VIC AB 0 CT1 27 (SUTURE)
SUT VIC AB 0 CT1 27XBRD ANTBC (SUTURE) IMPLANT
SUT VIC AB 2-0 CT1 27 (SUTURE)
SUT VIC AB 2-0 CT1 TAPERPNT 27 (SUTURE) IMPLANT
SUT VIC AB 2-0 SH 27 (SUTURE)
SUT VIC AB 2-0 SH 27X BRD (SUTURE) IMPLANT
SUT VIC AB 2-0 UR5 27 (SUTURE) ×3 IMPLANT
SUT VIC AB 2-0 UR6 27 (SUTURE) ×24 IMPLANT
SUT VIC AB 3-0 PS2 18 (SUTURE)
SUT VIC AB 3-0 PS2 18XBRD (SUTURE) IMPLANT
SUT VIC AB 3-0 SH 27 (SUTURE)
SUT VIC AB 3-0 SH 27X BRD (SUTURE) IMPLANT
SUT VICRYL 0 UR6 27IN ABS (SUTURE) ×6 IMPLANT
SYR 10ML LL (SYRINGE) ×3 IMPLANT
TISSUE AXIS TUTOPLAST 4X7 (Mesh General) ×3 IMPLANT
TOWEL OR NON WOVEN STRL DISP B (DISPOSABLE) ×3 IMPLANT
TUBING CONNECTING 10 (TUBING) ×2 IMPLANT
TUBING CONNECTING 10' (TUBING) ×1
YANKAUER SUCT BULB TIP 10FT TU (MISCELLANEOUS) ×3 IMPLANT

## 2016-08-28 NOTE — Progress Notes (Signed)
PHARMACIST - PHYSICIAN ORDER COMMUNICATION  CONCERNING: P&T Medication Policy on Herbal Medications  DESCRIPTION:  This patient's order for:  cinammon  has been noted.  This product(s) is classified as an "herbal" or natural product. Due to a lack of definitive safety studies or FDA approval, nonstandard manufacturing practices, plus the potential risk of unknown drug-drug interactions while on inpatient medications, the Pharmacy and Therapeutics Committee does not permit the use of "herbal" or natural products of this type within [facility].   ACTION TAKEN: The pharmacy department is unable to verify this order at this time and the order has been discontinued. Please reevaluate patient's clinical condition at discharge and address if the herbal or natural product(s) should be resumed at that time.  Royetta Asal, PharmD, BCPS Pager [phone] 08/28/2016 2:24 PM

## 2016-08-28 NOTE — Anesthesia Preprocedure Evaluation (Signed)
Anesthesia Evaluation  Patient identified by MRN, date of birth, ID band Patient awake    Reviewed: Allergy & Precautions, NPO status , Patient's Chart, lab work & pertinent test results  Airway Mallampati: II  TM Distance: >3 FB Neck ROM: Full    Dental no notable dental hx.    Pulmonary neg pulmonary ROS,    Pulmonary exam normal breath sounds clear to auscultation       Cardiovascular hypertension, + Peripheral Vascular Disease  Normal cardiovascular exam Rhythm:Regular Rate:Normal     Neuro/Psych negative neurological ROS  negative psych ROS   GI/Hepatic Neg liver ROS, GERD  ,  Endo/Other  diabetes  Renal/GU negative Renal ROS  negative genitourinary   Musculoskeletal negative musculoskeletal ROS (+)   Abdominal   Peds negative pediatric ROS (+)  Hematology negative hematology ROS (+)   Anesthesia Other Findings   Reproductive/Obstetrics negative OB ROS                             Anesthesia Physical Anesthesia Plan  ASA: III  Anesthesia Plan: General   Post-op Pain Management:    Induction: Intravenous  Airway Management Planned: Oral ETT  Additional Equipment:   Intra-op Plan:   Post-operative Plan: Extubation in OR  Informed Consent: I have reviewed the patients History and Physical, chart, labs and discussed the procedure including the risks, benefits and alternatives for the proposed anesthesia with the patient or authorized representative who has indicated his/her understanding and acceptance.   Dental advisory given  Plan Discussed with: CRNA and Surgeon  Anesthesia Plan Comments:         Anesthesia Quick Evaluation

## 2016-08-28 NOTE — Interval H&P Note (Signed)
History and Physical Interval Note:  08/28/2016 9:06 AM  Debra Phillips  has presented today for surgery, with the diagnosis of PELVIC PROLAPSE  The various methods of treatment have been discussed with the patient and family. After consideration of risks, benefits and other options for treatment, the patient has consented to  Procedure(s): ANTERIOR VAGINAL VAULT SUSPENSION WITH SACROSPINOUS SINSATION (N/A) as a surgical intervention .  The patient's history has been reviewed, patient examined, no change in status, stable for surgery.  I have reviewed the patient's chart and labs.  Questions were answered to the patient's satisfaction.     [name] I [name]  NB: Pt has been using vaginal estrogen, and Miralax. No hx of chronic constipation.

## 2016-08-28 NOTE — Progress Notes (Signed)
Patient in bed resting.  Patient has visit from 2 daughters.  Patient has no complaints at this time.

## 2016-08-28 NOTE — Anesthesia Procedure Notes (Signed)
Procedure Name: Intubation Date/Time: 08/28/2016 9:18 AM Performed by: West Pugh Pre-anesthesia Checklist: Patient identified, Emergency Drugs available, Suction available, Patient being monitored and Timeout performed Patient Re-evaluated:Patient Re-evaluated prior to inductionOxygen Delivery Method: Circle system utilized Preoxygenation: Pre-oxygenation with 100% oxygen Intubation Type: IV induction and Cricoid Pressure applied Ventilation: Mask ventilation without difficulty and Oral airway inserted - appropriate to patient size Laryngoscope Size: Mac and 3 Tube type: Oral Tube size: 7.0 mm Number of attempts: 1 Airway Equipment and Method: Stylet Placement Confirmation: ETT inserted through vocal cords under direct vision,  positive ETCO2,  CO2 detector and breath sounds checked- equal and bilateral Secured at: 20 cm Tube secured with: Tape Dental Injury: Teeth and Oropharynx as per pre-operative assessment

## 2016-08-28 NOTE — Anesthesia Postprocedure Evaluation (Addendum)
Anesthesia Post Note  Patient: Analiza Fien Lemay  Procedure(s) Performed: Procedure(s) (LRB): ANTERIOR VAGINAL VAULT SUSPENSION WITH SACROSPINOUS FIXATION (N/A) CYSTOSCOPY (N/A)  Patient location during evaluation: PACU Anesthesia Type: General Level of consciousness: awake and alert Pain management: pain level controlled Vital Signs Assessment: post-procedure vital signs reviewed and stable Respiratory status: spontaneous breathing, nonlabored ventilation, respiratory function stable and patient connected to nasal cannula oxygen Cardiovascular status: blood pressure returned to baseline and stable Postop Assessment: no signs of nausea or vomiting Anesthetic complications: no       Last Vitals:  Vitals:   08/28/16 1330 08/28/16 1352  BP: 113/65 128/63  Pulse: 72 69  Resp: 12 18  Temp:  36.5 C    Last Pain:  Vitals:   08/28/16 1400  TempSrc:   PainSc: 4                  Hugh Garrow S

## 2016-08-28 NOTE — Transfer of Care (Signed)
Immediate Anesthesia Transfer of Care Note  Patient: Debra Phillips  Procedure(s) Performed: Procedure(s): ANTERIOR VAGINAL VAULT SUSPENSION WITH SACROSPINOUS FIXATION (N/A) CYSTOSCOPY (N/A)  Patient Location: PACU  Anesthesia Type:General  Level of Consciousness:  sedated, patient cooperative and responds to stimulation  Airway & Oxygen Therapy:Patient Spontanous Breathing and Patient connected to face mask oxgen  Post-op Assessment:  Report given to PACU RN and Post -op Vital signs reviewed and stable  Post vital signs:  Reviewed and stable  Last Vitals:  Vitals:   08/28/16 0718  BP: (!) 155/84  Pulse: 83  Resp: 16  Temp: 123456 C    Complications: No apparent anesthesia complications

## 2016-08-28 NOTE — Op Note (Signed)
Pre-operative diagnosis :  Large anterior vault prolapse; apical prolapse  Postoperative diagnosis: Same  Operation: Interval prolapse repair, Kelly plication, augmentation with Coloplast axis dermis (7 cm x 4 cm); sacrospinous apical fixation  Surgeon:  S. Gaynelle Arabian, MD  First assistant: None  Anesthesia:  GET  Preparation: After appropriate preanesthesia, the patient was brought to the operative room, placed on the operating table in dorsal supine position where general endotracheal anesthesia was introduced. She was then replaced a dorsal lithotomy position with pubis was prepped with Betadine solution and draped in usual fashion. The history was reviewed. B and O suppository was placed.  Review history:  Kearah is an 83 year old female with pelvic prolapse for evaluation. She has urinary urgency and frequency, as well as urge incontinence. She uses 1 pad per day. She also leaks urine when she coughs, laughs, sneezes, or bears down. She complains of "sitting on a ball", which has increased to 14 years. She is para 7-5-2. She is status post total abdominal hysterectomy 30 years ago. No tobacco history. She has been treated with estrogen vaginal cream. Estring caused irritation. She denies constipation, with bowel movements every other day. Review of systems is significant for vaginitis, urinary frequency, urgency, as well as urge incontinence. Review of systems is also significant for history of rectocele. There is no history of tobacco abuse. On physical examination, she has atrophy of the female external genitalia, with pelvic prolapse noted. She has a cystocele, and a rectocele, both within 2 cm of the vaginal orifice. Her urethra appears to be healthy and in good position. She does not appear to have hypermobility of the urethra, and has a negative Marshall test, and a negative Q-tip test. She lives by herself, is alert, and takes care of herself all, although her family lives close by. She is  83 years old but has every expectation to lymph to be 90+. After office evaluation, with discussion of treatment options with both the patient and her daughter, they've like to proceed with the possibility of vaginal surgery in order to avoid a deeper anesthetic Department of laparoscopic approach to repair. Urodynamics evaluation is accomplished on 05/16/16 in the sitting position, and shows that the patient has a first desire at 71 cc, normal desire at 150 cc and a strong desire at 151 cc. Her first contraction occurs at 151 cc with unstable contraction pressure of 13 cm of water pressure. There is no urinary leakage, because the patient is able to this unstable contraction. She did feel urgency during this unstable contraction.   Leak point pressure evaluation shows that the patient does not leak with a maximum abdominal pressure of 80-120 cm of water pressure. Both x-ray and direct visualization were used to assess her urethra for leak point pressure. She did not leak with or without reduction of her prolapse.   Pressure flow study was accomplished, the patient was able to void 220 cc with a maximum flow rate 7 cc. The detrusor pressure at maximum flow is 27 cm of water pressure. The PVR is 30 cc. The patient voided with vaginal packing in place. She has a strong but poorly sustained bladder contraction.   Fluoroscopy and VCUG were accomplished, and showed no evidence of bladder stone, or reflux. The patient does have trabeculation, and greater than 3 cm bladder descent. Fluoroscopic evaluation also shows spinal hardware.   I believe this patient is a reasonable candidate for transvaginal natural orifice anterior vault repair using axis biologic dermis, with sacrospinous apical  fixation; and axis biologic reinforcement of rectocele. She will need overnight hospitalization, with removal of packing and catheter 24 hours.    Statement of  Likelihood of Success: Excellent. TIME-OUT  observed.:  Procedure: The retractor was placed, and Foley catheter placed. A large, grade 3-4 cystocele was identified, to the level of the introitus, and within 3 cm beyond the introitus. Using 1% Marcaine with epinephrine 1 200,000, 70 mL was injected, beginning 1 cm proximal to the urethrovaginal lines, which despite C Mark. This wasn't able to hydrodissect the tissue, and to give postoperative analgesia. The patient was previously given by mouth Tylenol for the case started, and IV antibiotics at the beginning of the procedure.  A 5 cm horizontal incision was then made, 1 cm proximal to the urethrovaginal line, and subcutis tissue dissected with sharp and blunt dissection. The large cystocele was then dissected proximally and distalward. Kelly plication was accomplished using 2-0 Vicryl horizontal mattress sutures. The pelvis was then dissected into the retroperitoneum, using heavy scissors. The ischial spines were identified bilaterally using blunt and sharp dissection. Using the ischial spines as a guide, the sacrospinous ligament were identified posterior medially. #1 permanent braided suture was then placed through the sacrospinous liquids bilaterally. 6 separate 3-0 Monocryl sutures were placed in the bladder base, and the [place] by 4 similar portion of Coloplast axis dermis was soaked, and then fashioned for placement. The proximal edges were then sutured with the #1 sutures, and sutured to the sacrospinous ligaments. The 3-0 Vicryl sutures were brought through the tissue, and an excellent smooth tissue augmentation was accomplished. The vaginal epithelium was then closed, with excision of the redundant tissue. Irrigation was accomplished, in no bleeding was noted. Although the patient was found to have a small rectocele, it was felt to not be in need of repair at this time. Cystoscopy after IV Fluorecein dye showed excellent urinary colorization. The patient did have cellules and diverticula in the  bladder, and I did not see jets of urine from the ureteral orifice ease. However, I was coupled with the amount of urine and Fluorecein found. Vaginal packing was then placed using Estrace, and Foley catheter was replaced. The patient was given IV Toradol, awakened, taken to recovery room in good condition.

## 2016-08-28 NOTE — H&P (Signed)
Office Visit Report     06/06/2016   --------------------------------------------------------------------------------   Debra Phillips. [place]  MRN: [id number]  PRIMARY CARE:  Chipper Herb, MD  DOB: 1929-02-16, 83 year old Female  REFERRING:  Raizel Wesolowski I. Gaynelle Arabian, MD  PROVIDER:  Carolan Clines, M.D.    LOCATION:  [facility] [phone]   --------------------------------------------------------------------------------   CC: I have pelvic prolapse.  HPI: Debra Phillips is a 83 year-old female established patient who is here for pelvic prolapse.  She first noticed her pelvic prolapse approximately 03/25/2006. Her symptoms have been worse over the last year. She does have pelvic pain.   She does have an abnormal sensation when needing to urinate. She is urinating more frequently now than usual. She does not have a good size and strength to her urinary stream. She is not having problems with emptying her bladder well.   She is having problems with urinary control or incontinence. She does wear protective pads. She uses 1 per day. She can get to the bathroom in time when she gets the urge to urinate. She does leak urine when she coughs, laughs, sneezes or bears down.   Para 7-5-2 female, C/o feeling like she is sitting on a "ball", increasing for 10 yrs, following hysterectomy 30 yrs ago. Also incontinence, urge type. No stress icontinence noted. No tobacco hx. She has been treated with Estrogen vaginal cream, instead of Estring-which caused irritation. She has no constipation-with BM qd to qod.   [name] is an 83 year old female with pelvic prolapse for evaluation. She has urinary urgency and frequency, as well as urge incontinence. She uses 1 pad per day. She also leaks urine when she coughs, laughs, sneezes, or bears down. She complains of "sitting on a ball", which has increased to 14 years. She is para 7-5-2. She is status post total abdominal hysterectomy 30 years ago. No  tobacco history. She has been treated with estrogen vaginal cream. Estring caused irritation. She denies constipation, with bowel movements every other day. Review of systems is significant for vaginitis, urinary frequency, urgency, as well as urge incontinence. Review of systems is also significant for history of rectocele. There is no history of tobacco abuse. On physical examination, she has atrophy of the female external genitalia, with pelvic prolapse noted. She has a cystocele, and a rectocele, both within 2 cm of the vaginal orifice. Her urethra appears to be healthy and in good position. She does not appear to have hypermobility of the urethra, and has a negative Marshall test, and a negative Q-tip test. She lives by herself, is alert, and takes care of herself all, although her family lives close by. She is 83 years old but has every expectation to lymph to be 90+. After office evaluation, with discussion of treatment options with both the patient and her daughter, they've like to proceed with the possibility of vaginal surgery in order to avoid a deeper anesthetic Department of laparoscopic approach to repair. Urodynamics evaluation is accomplished on 05/16/16 in the sitting position, and shows that the patient has a first desire at 71 cc, normal desire at 150 cc and a strong desire at 151 cc. Her first contraction occurs at 151 cc with unstable contraction pressure of 13 cm of water pressure. There is no urinary leakage, because the patient is able to this unstable contraction. She did feel urgency during this unstable contraction.   Leak point pressure evaluation shows that the patient does not leak with a maximum abdominal  pressure of 80-120 cm of water pressure. Both x-ray and direct visualization were used to assess her urethra for leak point pressure. She did not leak with or without reduction of her prolapse.   Pressure flow study was accomplished, the patient was able to void 220 cc with a  maximum flow rate 7 cc. The detrusor pressure at maximum flow is 27 cm of water pressure. The PVR is 30 cc. The patient voided with vaginal packing in place. She has a strong but poorly sustained bladder contraction.   Fluoroscopy and VCUG were accomplished, and showed no evidence of bladder stone, or reflux. The patient does have trabeculation, and greater than 3 cm bladder descent. Fluoroscopic evaluation also shows spinal hardware.   I believe this patient is a reasonable candidate for transvaginal natural orifice anterior vault repair using axis biologic dermis, with sacrospinous apical fixation; and axis biologic reinforcement of rectocele. She will need overnight hospitalization, with removal of packing and catheter 24 hours.      CC: I have atrophic vaginitis.  HPI: She has had it for 2 years. Her symptoms have gotten worse over the last year.   She is currently having trouble urinating. She does have a burning sensation when she urinates. She can not get to the bathroom in time when she gets the urge to urinate. She is urinating more frequently now than usual.   She does not have a good size and strength to her urinary stream. She is having problems with emptying her bladder well.   Currently using Estrace cream twice a week (when she remembers too use it).  Hysterectomy 30 yrs ago for benign disease.     ALLERGIES: Codeine Derivatives Sulfa Drugs Vancomycin HCl POWD    MEDICATIONS: Estrace 0.01 % cream with applicator  Metformin Hcl 500 mg tablet  Aspirin Ec 81 mg tablet, delayed release  Calcium + D  Cinnamon  Dexilant 60 mg capsule, delayed release, biphasic  Diltiazem 24Hr Cd 180 mg capsule, ext release 24 hr  Fish Oil  Flaxseed  Garlic  Lisinopril-Hydrochlorothiazide 20 mg-12.5 mg tablet  Multivitamin  Preservision Lutein  Probiotic  Tramadol Hcl Er  Turmeric Curcumin Complex  Vitamin C  Vitamin D     GU PSH: Complex cystometrogram, w/ void pressure and  urethral pressure profile studies, any technique - 05/16/2016 Complex Uroflow - 05/16/2016 Emg surf Electrd - 05/16/2016 Hysterectomy Unilat SO - 2013 Inject For cystogram - 05/16/2016 Intrabd voidng Press - 05/16/2016      PSH Notes: Esophageal Dilation, Shoulder Surgery, back Surgery, Wrist Surgery, Elbow Surgery   NON-GU PSH: Cholecystectomy - 2013 Revise Knee Joint - 2013    GU PMH: Female genital prolapse, unspecified - 03/31/2016 Postmenopausal atrophic vaginitis - 03/31/2016, Postmenopausal atrophic vaginitis, Atrophic vaginitis - 12/25/2015 Cystocele, midline, Cystocele, midline - 12/25/2015 Recurrent Cystitis w/o hematuria, Chronic cystitis - 12/25/2015 Urge incontinence, Urge incontinence of urine - 12/25/2015 Acute vaginitis, Vaginitis - 05/25/2015 Rectocele, Rectocele, female - 2014 Urinary Tract Inf, Unspec site, Urinary tract infection - 2014      PMH Notes:  2011-11-04 14:13:55 - Note: Aneurysm Of The Abdominal Aorta  2011-11-04 14:11:38 - Note: Venous Thrombosis Of The Deep Vessels Of The Lower Extremity  2011-11-04 14:11:38 - Note: Arthritis  2014-06-08 14:42:41 - Note: Renal angiomyolipoma, unspecified laterality   NON-GU PMH: Long term (current) use of antibiotics, Preventive antibiotic - 2016 Encounter for general adult medical examination without abnormal findings, Encounter for preventive health examination - 2016 Arrhythmia, Rhythm Disorder - 2014  Personal history of other diseases of the circulatory system, History of hypertension - 2014 Personal history of other diseases of the digestive system, History of esophageal reflux - 2014 Personal history of other endocrine, nutritional and metabolic disease, History of diabetes mellitus - 2014, History of hypercholesterolemia, - 2014    FAMILY HISTORY: Brain tumor - Father Death In The Family Father - Runs In Family Death In The Family Mother - Runs In Family Family Health Status Number - Runs In Family liver cancer -  Brother Lung Cancer - Brother pneumonia - Mother   SOCIAL HISTORY: Marital Status: Widowed Current Smoking Status: Patient has never smoked.  Has never drank.  Drinks 1 caffeinated drink per day. Patient's occupation is/was Retired.    REVIEW OF SYSTEMS:    GU Review Female:   Patient reports burning /pain with urination and leakage of urine. Patient denies frequent urination, hard to postpone urination, get up at night to urinate, stream starts and stops, trouble starting your stream, have to strain to urinate, and currently pregnant.  Gastrointestinal (Upper):   Patient reports indigestion/ heartburn. Patient denies nausea and vomiting.  Gastrointestinal (Lower):   Patient denies diarrhea and constipation.  Constitutional:   Patient reports fatigue. Patient denies fever, night sweats, and weight loss.  Skin:   Patient denies skin rash/ lesion and itching.  Eyes:   Patient denies double vision and blurred vision.  Ears/ Nose/ Throat:   Patient denies sore throat and sinus problems.  Hematologic/Lymphatic:   Patient denies swollen glands and easy bruising.  Cardiovascular:   Patient reports leg swelling. Patient denies chest pains.  Respiratory:   Patient denies cough and shortness of breath.  Endocrine:   Patient denies excessive thirst.  Musculoskeletal:   Patient reports back pain and joint pain.   Neurological:   Patient denies headaches and dizziness.  Psychologic:   Patient denies depression and anxiety.   VITAL SIGNS:      06/06/2016 02:59 PM  BP 111/73 mmHg  Pulse 77 /min  Temperature 97.0 F / 36 C   GU PHYSICAL EXAMINATION:    External Genitalia: No hirsuitism, no rash, no scarring, no cyst, no erythematous lesion, no papular lesion, no blanched lesion, no warty lesion, no labial adhesions, no atrophic introitus. No edema. Atrophy  Urethral Meatus: Normal size. Normal position. No discharge.   Urethra: No tenderness, no mass, no scarring. No hypermobility. No leakage.   Bladder: Normal to palpation, no tenderness, no mass, normal size.  Vagina: Mild vaginal atrophy. Moderate rectocele. Third-degree cystocele. No stenosis. No enterocele.   Cervix: S/P Hysterectomy  Uterus: S/P Hysterectomy  Adnexa / Parametria: No tenderness. No adnexal mass. Normal left ovary. Normal right ovary.  Anus and Perineum: No hemorrhoids. No anal stenosis. No rectal fissure, no anal fissure. No edema, no dimple, no perineal tenderness, no anal tenderness.   MULTI-SYSTEM PHYSICAL EXAMINATION:    Constitutional: Well-nourished. No physical deformities. Normally developed. Good grooming.  Neck: Neck symmetrical, not swollen. Normal tracheal position.  Respiratory: No labored breathing, no use of accessory muscles.   Cardiovascular: Normal temperature, normal extremity pulses, no swelling, no varicosities.  Lymphatic: No enlargement of neck, axillae, groin.  Skin: No paleness, no jaundice, no cyanosis. No lesion, no ulcer, no rash.  Neurologic / Psychiatric: Oriented to time, oriented to place, oriented to person. No depression, no anxiety, no agitation.  Gastrointestinal: No mass, no tenderness, no rigidity, non obese abdomen.  Eyes: Normal conjunctivae. Normal eyelids.  Ears, Nose, Mouth, and Throat: Left  ear no scars, no lesions, no masses. Right ear no scars, no lesions, no masses. Nose no scars, no lesions, no masses. Normal hearing. Normal lips.  Musculoskeletal: Normal gait and station of head and neck.     PAST DATA REVIEWED:  Source Of History:  Patient  Records Review:   Previous Patient Records  Urodynamics Review:   Review Urodynamics Tests   PROCEDURES: None   ASSESSMENT:      ICD-10 Details  1 GU:   Postmenopausal atrophic vaginitis - N95.2   2   Female genital prolapse, unspecified - N81.9           Notes:   Debra Phillips is an 83 yo female, seen today with her daughter, in follow-up of her video urodynamics examination. She complains of urinary urgency, frequency,  and urge incontinence, using 1 pad per day, which is soaked. She notices some urinary leakage with cough , but believes her biggest issue is her urge incontinence. She complains of "sitting on a ball", which has increased over 14 years. She is para 7-5-2, status post total abdominal hysterectomy 30 years ago. No tobacco. She has been using estrogen vaginal cream, when she can remember to use it. Estring caused irritation.   She denies constipation, and has a bowel movement every other day. She complains of urinary frequency, urgency, and urge incontinence. She has known cystocele and rectocele. On physical examination, she was noted to have atrophic vaginitis, and grade 3 anterior vault prolapse, and grade 2 posterior prolapse. She had a negative Marshall test, and a negative Q- tip test. Her urodynamic examination showed that she had a small capacity bladder, with early sensation. Her first desire was at 71 cc. Her maximum bladder capacity is 225 cc. Pressure flow study shows that she is able to void with a maximum flow rate of 7 cc/s, with a maximum detrusor pressure of 27 centimeters water pressure. PVR is only 30 cc. She is able to void with vaginal packing in place. She has a long, sustained bladder contraction.   I discussed the case again with the patient and her daughter in detail. Her problem is one of lifestyle. She has chronic suprapubic pain, and difficulty voiding. She does not, however, need to digitate in order to void or have bowel movements. It would be reasonable to repair her through a natural orifice incision, and we have discussed this repair, which would include anterior vault repair with Kelly plication, and axis biologic dermis augmentation, with sacral spinous apical fixation, and axis biologic reinforcement of the rectocele. She would need overnight hospitalization, with vaginal packing and Foley catheterization for 24 hours. She would be encouraged to walk, but would be restricted from  riding a motorcycle, or other activity which would aggravate her perineal repair, until fully healed, for approximately 3 months.   I do not think she needs to have a urethral sling, as she did not leak for [facility] test, she had a negative Q-tip test, and she did not leak on video urodynamic examination, even with packing reducing her prolapse.   She will consider her options with her family, and will call to schedule. She understands that her surgery will be approximately 3-4 hours, and risks involved including anesthesia risks, infection, blood clots, and risk of failure of the procedure up to 20%.    PLAN:            Medications New Meds: Myrbetriq 25 mg tablet, extended release 24 hr 1 tablet PO Daily   #  30  11 Refill(s)            Schedule Return Visit: Next Available Appointment - Schedule Surgery          Document Letter(s):  Created for Patient: Clinical Summary         Notes:   Green sheet written. Pt to call to schedule.          The information contained in this medical record document is considered private and confidential patient information. This information can only be used for the medical diagnosis and/or medical services that are being provided by the patient's selected caregivers. This information can only be distributed outside of the patient's care if the patient agrees and signs waivers of authorization for this information to be sent to an outside source or route.

## 2016-08-29 ENCOUNTER — Encounter (HOSPITAL_COMMUNITY): Payer: Self-pay | Admitting: Urology

## 2016-08-29 DIAGNOSIS — N952 Postmenopausal atrophic vaginitis: Secondary | ICD-10-CM | POA: Diagnosis not present

## 2016-08-29 DIAGNOSIS — N3941 Urge incontinence: Secondary | ICD-10-CM | POA: Diagnosis not present

## 2016-08-29 DIAGNOSIS — N816 Rectocele: Secondary | ICD-10-CM | POA: Diagnosis not present

## 2016-08-29 DIAGNOSIS — N8110 Cystocele, unspecified: Secondary | ICD-10-CM | POA: Diagnosis not present

## 2016-08-29 DIAGNOSIS — N323 Diverticulum of bladder: Secondary | ICD-10-CM | POA: Diagnosis not present

## 2016-08-29 DIAGNOSIS — N8189 Other female genital prolapse: Secondary | ICD-10-CM | POA: Diagnosis not present

## 2016-08-29 LAB — GLUCOSE, CAPILLARY
Glucose-Capillary: 173 mg/dL — ABNORMAL HIGH (ref 65–99)
Glucose-Capillary: 175 mg/dL — ABNORMAL HIGH (ref 65–99)

## 2016-08-29 MED ORDER — TRIMETHOPRIM 100 MG PO TABS: 100.0000 mg | ORAL_TABLET | ORAL | 1 refills | Status: DC

## 2016-08-29 MED ORDER — TRAMADOL-ACETAMINOPHEN 37.5-325 MG PO TABS: 1.0000 | ORAL_TABLET | Freq: Four times a day (QID) | ORAL | 0 refills | Status: DC | PRN

## 2016-08-29 MED ORDER — MELOXICAM 15 MG PO TABS: 15.0000 mg | ORAL_TABLET | Freq: Every day | ORAL | 1 refills | Status: DC

## 2016-08-29 NOTE — Progress Notes (Signed)
Urology Progress Note  1 Day Post-Op  Subjective: Post op anterior vault suspention. C/o  Buttock pain.Marland Kitchen Pt feels that she " can't walk".  + void this AM. No BM this am.    No acute urologic events overnight. Ambulation:   negative. Up on side of bed Flatus:    positive Bowel movement  negative  Pain: C/o butock pain.   Objective:  Blood pressure (!) 131/56, pulse 80, temperature 98.1 F (36.7 C), temperature source Oral, resp. rate 18, height 5\' 3"  (1.6 m), weight 74.9 kg (165 lb 1.6 oz), SpO2 100 %.  Physical Exam:  General:  No acute distress, awake Extremities: extremities normal, atraumatic, no cyanosis or edema Genitourinary:  Normal s-p exam Foley:out    I/O last 3 completed shifts: In: 2000 [I.V.:2000] Out: 2520 [Urine:2420; Blood:100]  No results for input(s): HGB, WBC, PLT in the last 72 hours.  No results for input(s): NA, K, CL, CO2, BUN, CREATININE, CALCIUM, GFRNONAA, GFRAA in the last 72 hours.  Invalid input(s): MAGNESIUM   No results for input(s): INR, APTT in the last 72 hours.  Invalid input(s): PT   Invalid input(s): ABG  Assessment/Plan:  Discussed pain with pt and her daughter. Pt to take meloxicam each day, and may take pain med. Pain may be from sacrospinus sutures/inflammation. If pain persists, will Rx with prednisone dosepack as op next week.    Will allow d/c today. Daughter staying with mother.

## 2016-08-29 NOTE — Discharge Instructions (Signed)
Pelvic Organ Prolapse Introduction Pelvic organ prolapse is the stretching, bulging, or dropping of pelvic organs into an abnormal position. It happens when the muscles and tissues that surround and support pelvic structures are stretched or weak. Pelvic organ prolapse can involve:  Vagina (vaginal prolapse).  Uterus (uterine prolapse).  Bladder (cystocele).  Rectum (rectocele).  Intestines (enterocele). When organs other than the vagina are involved, they often bulge into the vagina or protrude from the vagina, depending on how severe the prolapse is. What are the causes? Causes of this condition include:  Pregnancy, labor, and childbirth.  Long-lasting (chronic) cough.  Chronic constipation.  Obesity.  Past pelvic surgery.  Aging. During and after menopause, a decreased production of the hormone estrogen can weaken pelvic ligaments and muscles.  Consistently lifting more than 50 lb (23 kg).  Buildup of fluid in the abdomen due to certain diseases and other conditions. What are the signs or symptoms? Symptoms of this condition include:  Loss of bladder control when you cough, sneeze, strain, and exercise (stress incontinence). This may be worse immediately following childbirth, and it may gradually improve over time.  Feeling pressure in your pelvis or vagina. This pressure may increase when you cough or when you are having a bowel movement.  A bulge that protrudes from the opening of your vagina or against your vaginal wall. If your uterus protrudes through the opening of your vagina and rubs against your clothing, you may also experience soreness, ulcers, infection, pain, and bleeding.  Increased effort to have a bowel movement or urinate.  Pain in your low back.  Pain, discomfort, or disinterest in sexual intercourse.  Repeated bladder infections (urinary tract infections).  Difficulty inserting or inability to insert a tampon or applicator. In some people, this  condition does not cause any symptoms. How is this diagnosed? Your health care provider may perform an internal and external vaginal and rectal exam. During the exam, you may be asked to cough and strain while you are lying down, sitting, and standing up. Your health care provider will determine if other tests are required, such as bladder function tests. How is this treated? In most cases, this condition needs to be treated only if it produces symptoms. No treatment is guaranteed to correct the prolapse or relieve the symptoms completely. Treatment may include:  Lifestyle changes, such as:  Avoiding drinking beverages that contain caffeine.  Increasing your intake of high-fiber foods. This can help to decrease constipation and straining during bowel movements.  Emptying your bladder at scheduled times (bladder training therapy). This can help to reduce or avoid urinary incontinence.  Losing weight if you are overweight or obese.  Estrogen. Estrogen may help mild prolapse by increasing the strength and tone of pelvic floor muscles.  Kegel exercises. These may help mild cases of prolapse by strengthening and tightening the muscles of the pelvic floor.  Pessary insertion. A pessary is a soft, flexible device that is placed into your vagina by your health care provider to help support the vaginal walls and keep pelvic organs in place.  Surgery. This is often the only form of treatment for severe prolapse. Different types of surgeries are available. Follow these instructions at home:  Wear a sanitary pad or absorbent product if you have urinary incontinence.  Avoid heavy lifting and straining with exercise and work. Do not hold your breath when you perform mild to moderate lifting and exercise activities. Limit your activities as directed by your health care provider.  Take   medicines only as directed by your health care provider.  Perform Kegel exercises as directed by your health care  provider.  If you have a pessary, take care of it as directed by your health care provider. Contact a health care provider if:  Your symptoms interfere with your daily activities or sex life.  You need medicine to help with the discomfort.  You notice bleeding from the vagina that is not related to your period.  You have a fever.  You have pain or bleeding when you urinate.  You have bleeding when you have a bowel movement.  You lose urine when you have sex.  You have chronic constipation.  You have a pessary that falls out.  You have vaginal discharge that has a bad smell.  You have low abdominal pain or cramping that is unusual for you. This information is not intended to replace advice given to you by your health care provider. Make sure you discuss any questions you have with your health care provider. Document Released: 03/08/2014 Document Revised: 01/17/2016 Document Reviewed: 10/24/2013  2017 [employer]vier  

## 2016-08-29 NOTE — Discharge Summary (Signed)
Physician Discharge Summary  Patient ID: DEONDREA REUM MRN: [id number] DOB/AGE: August 25, 1929 83 y.o.  Admit date: 08/28/2016 Discharge date: 08/29/2016  Admission Diagnoses: PELVIC PROLAPSE  Discharge Diagnoses:  Active Problems:   Pelvic prolapse Anterior vault, grade III  Discharged Condition: stable. + voiding well, but c/o buttock/low back pain-chronic vs surgical related. Plan: Meloxican 15mg /day, heat, prn Ultram ( caution: causes nausea).   Hospital Course: Anterior vault prolapse repair, Kelly plication, Augmentation with dermis and sacrospinus apical repair.   Significant Diagnostic Studies: No results found.  Discharge Exam: Blood pressure (!) 131/56, pulse 80, temperature 98.1 F (36.7 C), temperature source Oral, resp. rate 18, height 5\' 3"  (1.6 m), weight 74.9 kg (165 lb 1.6 oz), SpO2 100 %.   Disposition: 01-Home or Self Care  Discharge Instructions    Discontinue IV    Complete by:  As directed      Allergies as of 08/29/2016      Reactions   Codeine Nausea And Vomiting   Doxycycline Hives   Other Nausea Only   Almost all antibiotics   Sulfonamide Derivatives Hives   Vancomycin Itching      Medication List    TAKE these medications   acetaminophen 650 MG CR tablet Commonly known as:  TYLENOL Take 1,300 mg by mouth every 8 (eight) hours as needed for pain.   Calcium Carb-Cholecalciferol 600-500 MG-UNIT Caps Take 1 capsule by mouth every morning.   Cholecalciferol 1000 units tablet Take 1,000 Units by mouth daily.   CINNAMON PO Take 1 tablet by mouth daily.   diltiazem 180 MG 24 hr capsule Commonly known as:  CARDIZEM CD TAKE (1) CAPSULE DAILY   ESTRACE VAGINAL 0.1 MG/GM vaginal cream Generic drug:  estradiol Place 1 Applicatorful vaginally once a week.   Fish Oil 1000 MG Caps Take 1,000 mg by mouth at bedtime.   fluticasone 50 MCG/ACT nasal spray Commonly known as:  FLONASE Place 2 sprays into both nostrils daily. What changed:  when  to take this  reasons to take this   furosemide 20 MG tablet Commonly known as:  LASIX Take 20 mg by mouth daily as needed for fluid or edema.   furosemide 20 MG tablet Commonly known as:  LASIX Take 3 tablets (60 mg total) by mouth daily.   ICAPS AREDS 2 PO Take 1 capsule by mouth daily.   JOBST ACTIVE 20-30MMHG MEDIUM Misc Wear daily to support circulation and prevent swelling   lisinopril-hydrochlorothiazide 20-12.5 MG tablet Commonly known as:  PRINZIDE,ZESTORETIC Take 1 tablet by mouth daily.   lisinopril-hydrochlorothiazide 10-12.5 MG tablet Commonly known as:  PRINZIDE,ZESTORETIC Take 2 tablets by mouth daily. For blood pressure   meloxicam 15 MG tablet Commonly known as:  MOBIC Take 1 tablet (15 mg total) by mouth daily.   metFORMIN 500 MG tablet Commonly known as:  GLUCOPHAGE TAKE  (1)  TABLET TWICE A DAY.   MYRBETRIQ 25 MG Tb24 tablet Generic drug:  mirabegron ER Take 25 mg by mouth daily.   nitroGLYCERIN 0.4 MG SL tablet Commonly known as:  NITROSTAT Place 1 tablet (0.4 mg total) under the tongue every 5 (five) minutes as needed for chest pain.   omeprazole 40 MG capsule Commonly known as:  PRILOSEC Take 1 capsule (40 mg total) by mouth 2 (two) times daily. What changed:  when to take this   potassium chloride 10 MEQ tablet Commonly known as:  K-DUR TAKE (1) TABLET DAILY AS DIRECTED.   PROBIOTIC DAILY PO Take 1 capsule  by mouth daily.   traMADol 50 MG tablet Commonly known as:  ULTRAM Take 50 mg by mouth at bedtime as needed for moderate pain.   traMADol-acetaminophen 37.5-325 MG tablet Commonly known as:  ULTRACET Take 1 tablet by mouth every 6 (six) hours as needed for moderate pain. Take with food   trimethoprim 100 MG tablet Commonly known as:  TRIMPEX Take 1 tablet (100 mg total) by mouth 1 day or 1 dose.      Follow-up Information    Ailene Rud, MD.   Specialty:  Urology Contact information: [address]  [place] [address] [phone]           Signed: Ailene Rud 08/29/2016, 9:42 AM

## 2016-09-04 DIAGNOSIS — N8111 Cystocele, midline: Secondary | ICD-10-CM | POA: Diagnosis not present

## 2016-09-30 ENCOUNTER — Other Ambulatory Visit: Payer: Self-pay | Admitting: Family Medicine

## 2016-10-10 DIAGNOSIS — N8111 Cystocele, midline: Secondary | ICD-10-CM | POA: Diagnosis not present

## 2016-11-07 DIAGNOSIS — Z1231 Encounter for screening mammogram for malignant neoplasm of breast: Secondary | ICD-10-CM | POA: Diagnosis not present

## 2016-11-14 ENCOUNTER — Encounter: Payer: Self-pay | Admitting: Family Medicine

## 2016-11-14 ENCOUNTER — Ambulatory Visit (INDEPENDENT_AMBULATORY_CARE_PROVIDER_SITE_OTHER): Payer: Medicare Other | Admitting: Family Medicine

## 2016-11-14 ENCOUNTER — Other Ambulatory Visit: Payer: Self-pay | Admitting: Family Medicine

## 2016-11-14 VITALS — BP 129/77 | HR 75 | Temp 96.6°F | Ht 63.0 in | Wt 167.0 lb

## 2016-11-14 DIAGNOSIS — E785 Hyperlipidemia, unspecified: Secondary | ICD-10-CM | POA: Diagnosis not present

## 2016-11-14 DIAGNOSIS — E1169 Type 2 diabetes mellitus with other specified complication: Secondary | ICD-10-CM | POA: Diagnosis not present

## 2016-11-14 DIAGNOSIS — M4726 Other spondylosis with radiculopathy, lumbar region: Secondary | ICD-10-CM | POA: Diagnosis not present

## 2016-11-14 DIAGNOSIS — E119 Type 2 diabetes mellitus without complications: Secondary | ICD-10-CM | POA: Diagnosis not present

## 2016-11-14 DIAGNOSIS — Z87440 Personal history of urinary (tract) infections: Secondary | ICD-10-CM

## 2016-11-14 DIAGNOSIS — E559 Vitamin D deficiency, unspecified: Secondary | ICD-10-CM | POA: Diagnosis not present

## 2016-11-14 DIAGNOSIS — I10 Essential (primary) hypertension: Secondary | ICD-10-CM

## 2016-11-14 DIAGNOSIS — M545 Low back pain: Secondary | ICD-10-CM

## 2016-11-14 DIAGNOSIS — K219 Gastro-esophageal reflux disease without esophagitis: Secondary | ICD-10-CM

## 2016-11-14 DIAGNOSIS — N3020 Other chronic cystitis without hematuria: Secondary | ICD-10-CM | POA: Diagnosis not present

## 2016-11-14 LAB — URINALYSIS, COMPLETE
BILIRUBIN UA: NEGATIVE
Glucose, UA: NEGATIVE
Ketones, UA: NEGATIVE
NITRITE UA: NEGATIVE
PH UA: 7.5 (ref 5.0–7.5)
PROTEIN UA: NEGATIVE
RBC UA: NEGATIVE
Specific Gravity, UA: 1.015 (ref 1.005–1.030)
UUROB: 0.2 mg/dL (ref 0.2–1.0)

## 2016-11-14 LAB — MICROSCOPIC EXAMINATION
Epithelial Cells (non renal): >10 /hpf — AB (ref 0–10)
RENAL EPITHEL UA: NONE SEEN /HPF

## 2016-11-14 NOTE — Patient Instructions (Addendum)
Medicare Annual Wellness Visit  [facility] and the medical providers at [facility] strive to bring you the best medical care.  In doing so we not only want to address your current medical conditions and concerns but also to detect new conditions early and prevent illness, disease and health-related problems.    Medicare offers a yearly Wellness Visit which allows our clinical staff to assess your need for preventative services including immunizations, lifestyle education, counseling to decrease risk of preventable diseases and screening for fall risk and other medical concerns.    This visit is provided free of charge (no copay) for all Medicare recipients. The clinical pharmacists at [facility] have begun to conduct these Wellness Visits which will also include a thorough review of all your medications.    As you primary medical provider recommend that you make an appointment for your Annual Wellness Visit if you have not done so already this year.  You may set up this appointment before you leave today or you may call back ([phone]) and schedule an appointment.  Please make sure when you call that you mention that you are scheduling your Annual Wellness Visit with the clinical pharmacist so that the appointment may be made for the proper length of time.     Continue current medications. Continue good therapeutic lifestyle changes which include good diet and exercise. Fall precautions discussed with patient. If an FOBT was given today- please return it to our front desk. If you are over 49 years old - you may need Prevnar 21 or the adult Pneumonia vaccine.  **Flu shots are available--- please call and schedule a FLU-CLINIC appointment**  After your visit with [place] today you will receive a survey in the mail or online from [employer] regarding your care with [place]. Please take a moment to fill this out. Your feedback is very  important to [place] as you can help [place] better understand your patient needs as well as improve your experience and satisfaction. WE CARE ABOUT YOU!!!   Continue to follow-up with neurosurgery and with the urologist because of your recent bladder repair. Always be careful and did not put yourself at risk for falling drink plenty of fluids and stay well hydrated and take extra strength Tylenol if needed for aches pains or fever

## 2016-11-14 NOTE — Addendum Note (Signed)
Addended by: Zannie Cove on: 11/14/2016 11:37 AM   Modules accepted: Orders

## 2016-11-14 NOTE — Progress Notes (Signed)
Subjective:    Patient ID: Debra Phillips, female    DOB: Oct 25, 1928, 83 y.o.   MRN: [id number]  HPI Pt here for follow up and management of chronic medical problems which includes diabetes and hypertension. She is taking medication regularly.The patient today complains of fatigue and weakness and back pain. She has a long history of back pain and has seen neurosurgery about this. She is also had recent bladder surgery to try to help her with getting better control of her urine. The patient is due to get a urine microalbumin to creatinine ratio. The recent surgery was done because of a mid line cystocele by Dr. Era Bumpers. She is pleased with her surgery and he basically did what sounds like + anterior repair did not do a posterior repair. She is taking MiraLAX to keep her bowels working regularly and she does seem to have better bladder control. She plans to see him again in 6 months or in June. She denies any chest pain or shortness of breath anymore than usual. She is doing okay with her stomach as far as nausea and vomiting diarrhea or blood in the stool but does have the constipation which seems to be helping I the MiraLAX. She is doing well with passing her water post surgery. She is somewhat worried about 2 younger siblings and spent a lot of time talking about them and supporting them. Independent woman and does not like to be babied by anybody. We at [facility] this tenacity.   Patient Active Problem List   Diagnosis Date Noted  . Pelvic prolapse 08/28/2016  . Tremor 01/29/2016  . Abnormality of gait 01/29/2016  . Type 2 diabetes mellitus with hyperlipidemia ([place]) 01/14/2016  . Osteoarthritis of lumbar spine 05/08/2015  . Spondylosis of lumbar region without myelopathy or radiculopathy 09/01/2014  . Diabetes type 2, controlled ([place]) 04/26/2014  . Vitamin D deficiency 04/26/2014  . Chronic cystitis 04/26/2014  . Atrophic vaginitis 04/26/2014  . Solitary pulmonary nodule 03/20/2014  .  Chest pain 03/18/2014  . Osteoporotic compression fracture of spine with routine healing 03/08/2014  . PALPITATIONS 02/27/2010  . Coronary atherosclerosis 12/12/2009  . Aortic aneurysm of unspecified site without mention of rupture 06/27/2009  . DYSPNEA 06/27/2009  . DYSLIPIDEMIA 12/12/2008  . Essential hypertension 12/12/2008  . GERD 12/12/2008  . OSTEOARTHRITIS 12/12/2008   Outpatient Encounter Prescriptions as of 11/14/2016  Medication Sig  . acetaminophen (TYLENOL) 650 MG CR tablet Take 1,300 mg by mouth every 8 (eight) hours as needed for pain.  . Calcium Carb-Cholecalciferol 600-500 MG-UNIT CAPS Take 1 capsule by mouth every morning.  . Cholecalciferol 1000 UNITS tablet Take 1,000 Units by mouth daily.   Marland Kitchen CINNAMON PO Take 1 tablet by mouth daily.  Marland Kitchen diltiazem (CARDIZEM CD) 180 MG 24 hr capsule TAKE (1) CAPSULE DAILY  . Elastic Bandages & Supports (JOBST ACTIVE 20-30MMHG MEDIUM) MISC Wear daily to support circulation and prevent swelling  . ESTRACE VAGINAL 0.1 MG/GM vaginal cream Place 1 Applicatorful vaginally once a week.   . fluticasone (FLONASE) 50 MCG/ACT nasal spray Place 2 sprays into both nostrils daily. (Patient taking differently: Place 2 sprays into both nostrils daily as needed for allergies or rhinitis. )  . furosemide (LASIX) 20 MG tablet Take 20 mg by mouth daily as needed for fluid or edema.   Marland Kitchen lisinopril-hydrochlorothiazide (PRINZIDE,ZESTORETIC) 10-12.5 MG tablet Take 2 tablets by mouth daily. For blood pressure  . meloxicam (MOBIC) 15 MG tablet Take 1 tablet (15 mg total) by  mouth daily.  . metFORMIN (GLUCOPHAGE) 500 MG tablet TAKE  (1)  TABLET TWICE A DAY.  . Multiple Vitamins-Minerals (ICAPS AREDS 2 PO) Take 1 capsule by mouth daily.   Marland Kitchen MYRBETRIQ 25 MG TB24 tablet Take 25 mg by mouth daily.   . nitroGLYCERIN (NITROSTAT) 0.4 MG SL tablet Place 1 tablet (0.4 mg total) under the tongue every 5 (five) minutes as needed for chest pain.  . Omega-3 Fatty Acids (FISH  OIL) 1000 MG CAPS Take 1,000 mg by mouth at bedtime.   Marland Kitchen omeprazole (PRILOSEC) 40 MG capsule Take 1 capsule (40 mg total) by mouth 2 (two) times daily. (Patient taking differently: Take 40 mg by mouth daily. )  . potassium chloride (K-DUR) 10 MEQ tablet TAKE (1) TABLET DAILY AS DIRECTED.  . Probiotic Product (PROBIOTIC DAILY PO) Take 1 capsule by mouth daily.  . [DISCONTINUED] furosemide (LASIX) 20 MG tablet Take 3 tablets (60 mg total) by mouth daily.  . [DISCONTINUED] lisinopril-hydrochlorothiazide (PRINZIDE,ZESTORETIC) 20-12.5 MG tablet Take 1 tablet by mouth daily.  . [DISCONTINUED] traMADol (ULTRAM) 50 MG tablet Take 50 mg by mouth at bedtime as needed for moderate pain.   . [DISCONTINUED] traMADol-acetaminophen (ULTRACET) 37.5-325 MG tablet Take 1 tablet by mouth every 6 (six) hours as needed for moderate pain. Take with food (Patient not taking: Reported on 11/14/2016)  . [DISCONTINUED] trimethoprim (TRIMPEX) 100 MG tablet Take 1 tablet (100 mg total) by mouth 1 day or 1 dose.   No facility-administered encounter medications on file as of 11/14/2016.      Review of Systems  Constitutional: Positive for fatigue.  HENT: Negative.   Eyes: Negative.   Respiratory: Negative.   Cardiovascular: Negative.   Gastrointestinal: Negative.   Endocrine: Negative.   Genitourinary: Negative.   Musculoskeletal: Positive for back pain (low back and hip pain bilateral ).  Skin: Negative.   Allergic/Immunologic: Negative.   Neurological: Positive for weakness.  Hematological: Negative.   Psychiatric/Behavioral: Negative.        Objective:   Physical Exam  Constitutional: She is oriented to person, place, and time. She appears well-developed and well-nourished. No distress.  HENT:  Head: Normocephalic and atraumatic.  Right Ear: External ear normal.  Left Ear: External ear normal.  Nose: Nose normal.  Mouth/Throat: Oropharynx is clear and moist.  Eyes: Conjunctivae and EOM are normal. Pupils  are equal, round, and reactive to light. Right eye exhibits no discharge. Left eye exhibits no discharge. No scleral icterus.  Neck: Normal range of motion. Neck supple. No thyromegaly present.  No thyromegaly or anterior cervical adenopathy or bruits.  Cardiovascular: Normal rate, regular rhythm, normal heart sounds and intact distal pulses.   No murmur heard. The heart has a regular rate and rhythm at 72/m and she has good pedal pulses.  Pulmonary/Chest: Effort normal and breath sounds normal. No respiratory distress. She has no wheezes. She has no rales.  Clear anteriorly and posteriorly  Abdominal: Soft. Bowel sounds are normal. She exhibits no mass. There is tenderness. There is no rebound and no guarding.  No liver or spleen enlargement and no masses. Abdominal tenderness was slight in the suprapubic area.  Musculoskeletal: She exhibits no edema.  The patient is stiff and slow in her range of motion activities especially with getting up to sit on the table and laying down and sitting back up again. She mentions in her history that she has to be moving in doing something or she gets very stiff with all of her arthritis.  Lymphadenopathy:    She has no cervical adenopathy.  Neurological: She is alert and oriented to person, place, and time. She has normal reflexes. No cranial nerve deficit.  Skin: Skin is warm and dry. No rash noted.  Psychiatric: She has a normal mood and affect. Her behavior is normal. Judgment and thought content normal.  Nursing note and vitals reviewed.  BP 129/77 (BP Location: Left Arm)   Pulse 75   Temp (!) 96.6 F (35.9 C) (Oral)   Ht _0  (1.6 m)   Wt 167 lb (75.8 kg)   BMI 29.58 kg/m        Assessment & Plan:  1. Controlled type 2 diabetes mellitus without complication, without long-term current use of insulin ([place]) -Home blood sugars have been running in the 140-160 range and she will continue to monitor these. It is too early for blood work today and  she will come back in a week or so to get her routine lab work done. - CBC with Differential/Platelet; Future - Lipid panel; Future - Bayer DCA Hb A1c Waived; Future  2. Vitamin D deficiency -Continue current treatment pending results of lab work - CBC with Differential/Platelet; Future - VITAMIN D 25 Hydroxy (Vit-D Deficiency, Fractures); Future  3. Essential hypertension -The blood pressure is good today and she will continue with current treatment - BMP8+EGFR; Future - CBC with Differential/Platelet; Future - Hepatic function panel; Future - Lipid panel; Future  4. Gastroesophageal reflux disease, esophagitis presence not specified -She has no complaints with her reflux today and she will continue with current treatment - CBC with Differential/Platelet; Future - Hepatic function panel; Future  5. Type 2 diabetes mellitus with hyperlipidemia ([facility]) -Continue with current treatment pending results of lab work - CBC with Differential/Platelet; Future - Bayer DCA Hb A1c Waived; Future  6. Osteoarthritis of spine with radiculopathy, lumbar region -Follow-up with neurosurgery at her convenience, Dr. Ellouise Newer. Continue to take extra strength Tylenol and stay as active as possible and avoid climbing - CBC with Differential/Platelet; Future  7. Recent urinary tract infection -Follow-up with urology as planned continue to drink plenty of fluids - Urinalysis, Complete - Urine culture  Patient Instructions                       Medicare Annual Wellness Visit  [facility] and the medical providers at [facility] strive to bring you the best medical care.  In doing so we not only want to address your current medical conditions and concerns but also to detect new conditions early and prevent illness, disease and health-related problems.    Medicare offers a yearly Wellness Visit which allows our clinical staff to assess your need for preventative services including  immunizations, lifestyle education, counseling to decrease risk of preventable diseases and screening for fall risk and other medical concerns.    This visit is provided free of charge (no copay) for all Medicare recipients. The clinical pharmacists at [facility] have begun to conduct these Wellness Visits which will also include a thorough review of all your medications.    As you primary medical provider recommend that you make an appointment for your Annual Wellness Visit if you have not done so already this year.  You may set up this appointment before you leave today or you may call back ([phone]) and schedule an appointment.  Please make sure when you call that you mention that you are scheduling your Annual  Wellness Visit with the clinical pharmacist so that the appointment may be made for the proper length of time.     Continue current medications. Continue good therapeutic lifestyle changes which include good diet and exercise. Fall precautions discussed with patient. If an FOBT was given today- please return it to our front desk. If you are over 6 years old - you may need Prevnar 71 or the adult Pneumonia vaccine.  **Flu shots are available--- please call and schedule a FLU-CLINIC appointment**  After your visit with [place] today you will receive a survey in the mail or online from [employer] regarding your care with [place]. Please take a moment to fill this out. Your feedback is very important to [place] as you can help [place] better understand your patient needs as well as improve your experience and satisfaction. WE CARE ABOUT YOU!!!   Continue to follow-up with neurosurgery and with the urologist because of your recent bladder repair. Always be careful and did not put yourself at risk for falling drink plenty of fluids and stay well hydrated and take extra strength Tylenol if needed for aches pains or fever    Arrie Senate MD

## 2016-11-18 LAB — URINE CULTURE

## 2016-11-19 MED ORDER — AMOXICILLIN-POT CLAVULANATE 875-125 MG PO TABS: 1.0000 | ORAL_TABLET | Freq: Two times a day (BID) | ORAL | 0 refills | Status: DC

## 2016-11-19 NOTE — Addendum Note (Signed)
Addended by: Shelbie Ammons on: 11/19/2016 10:01 AM   Modules accepted: Orders

## 2016-11-20 DIAGNOSIS — M419 Scoliosis, unspecified: Secondary | ICD-10-CM | POA: Diagnosis not present

## 2016-11-20 DIAGNOSIS — M25552 Pain in left hip: Secondary | ICD-10-CM | POA: Diagnosis not present

## 2016-11-20 DIAGNOSIS — M47816 Spondylosis without myelopathy or radiculopathy, lumbar region: Secondary | ICD-10-CM | POA: Diagnosis not present

## 2016-11-20 DIAGNOSIS — M1612 Unilateral primary osteoarthritis, left hip: Secondary | ICD-10-CM | POA: Diagnosis not present

## 2016-11-20 DIAGNOSIS — Z981 Arthrodesis status: Secondary | ICD-10-CM | POA: Diagnosis not present

## 2016-11-20 DIAGNOSIS — M5136 Other intervertebral disc degeneration, lumbar region: Secondary | ICD-10-CM | POA: Diagnosis not present

## 2016-11-25 ENCOUNTER — Other Ambulatory Visit: Payer: Medicare Other

## 2016-11-25 DIAGNOSIS — K219 Gastro-esophageal reflux disease without esophagitis: Secondary | ICD-10-CM | POA: Diagnosis not present

## 2016-11-25 DIAGNOSIS — E559 Vitamin D deficiency, unspecified: Secondary | ICD-10-CM | POA: Diagnosis not present

## 2016-11-25 DIAGNOSIS — I10 Essential (primary) hypertension: Secondary | ICD-10-CM

## 2016-11-25 DIAGNOSIS — W57XXXA Bitten or stung by nonvenomous insect and other nonvenomous arthropods, initial encounter: Secondary | ICD-10-CM

## 2016-11-25 DIAGNOSIS — E785 Hyperlipidemia, unspecified: Secondary | ICD-10-CM

## 2016-11-25 DIAGNOSIS — M4726 Other spondylosis with radiculopathy, lumbar region: Secondary | ICD-10-CM

## 2016-11-25 DIAGNOSIS — E1169 Type 2 diabetes mellitus with other specified complication: Secondary | ICD-10-CM | POA: Diagnosis not present

## 2016-11-25 DIAGNOSIS — E119 Type 2 diabetes mellitus without complications: Secondary | ICD-10-CM

## 2016-11-25 LAB — BAYER DCA HB A1C WAIVED: HB A1C (BAYER DCA - WAIVED): 6.7 % (ref <7.0)

## 2016-11-26 LAB — BMP8+EGFR
BUN/Creatinine Ratio: 16 (ref 12–28)
BUN: 12 mg/dL (ref 8–27)
CO2: 20 mmol/L (ref 18–29)
CREATININE: 0.76 mg/dL (ref 0.57–1.00)
Calcium: 9.4 mg/dL (ref 8.7–10.3)
Chloride: 94 mmol/L — ABNORMAL LOW (ref 96–106)
GFR, EST AFRICAN AMERICAN: 82 mL/min/{1.73_m2} (ref >59)
GFR, EST NON AFRICAN AMERICAN: 71 mL/min/{1.73_m2} (ref >59)
GLUCOSE: 182 mg/dL — AB (ref 65–99)
Potassium: 4.8 mmol/L (ref 3.5–5.2)
SODIUM: 134 mmol/L (ref 134–144)

## 2016-11-26 LAB — CBC WITH DIFFERENTIAL/PLATELET
BASOS: 1 %
Basophils Absolute: 0 10*3/uL (ref 0.0–0.2)
EOS (ABSOLUTE): 0.2 10*3/uL (ref 0.0–0.4)
EOS: 3 %
HEMATOCRIT: 35 % (ref 34.0–46.6)
Hemoglobin: 11.6 g/dL (ref 11.1–15.9)
Immature Grans (Abs): 0 10*3/uL (ref 0.0–0.1)
Immature Granulocytes: 0 %
LYMPHS ABS: 2.4 10*3/uL (ref 0.7–3.1)
Lymphs: 40 %
MCH: 29.3 pg (ref 26.6–33.0)
MCHC: 33.1 g/dL (ref 31.5–35.7)
MCV: 88 fL (ref 79–97)
MONOS ABS: 0.5 10*3/uL (ref 0.1–0.9)
Monocytes: 8 %
Neutrophils Absolute: 2.8 10*3/uL (ref 1.4–7.0)
Neutrophils: 48 %
Platelets: 355 10*3/uL (ref 150–379)
RBC: 3.96 x10E6/uL (ref 3.77–5.28)
RDW: 13.4 % (ref 12.3–15.4)
WBC: 5.9 10*3/uL (ref 3.4–10.8)

## 2016-11-26 LAB — HEPATIC FUNCTION PANEL
ALT: 11 IU/L (ref 0–32)
AST: 17 IU/L (ref 0–40)
Albumin: 4.2 g/dL (ref 3.5–4.7)
Alkaline Phosphatase: 60 IU/L (ref 39–117)
BILIRUBIN TOTAL: 0.3 mg/dL (ref 0.0–1.2)
Bilirubin, Direct: 0.09 mg/dL (ref 0.00–0.40)
Total Protein: 6.1 g/dL (ref 6.0–8.5)

## 2016-11-26 LAB — LIPID PANEL
CHOL/HDL RATIO: 4.9 ratio — AB (ref 0.0–4.4)
CHOLESTEROL TOTAL: 194 mg/dL (ref 100–199)
HDL: 40 mg/dL (ref >39)
LDL Calculated: 112 mg/dL — ABNORMAL HIGH (ref 0–99)
TRIGLYCERIDES: 212 mg/dL — AB (ref 0–149)
VLDL Cholesterol Cal: 42 mg/dL — ABNORMAL HIGH (ref 5–40)

## 2016-11-26 LAB — VITAMIN D 25 HYDROXY (VIT D DEFICIENCY, FRACTURES): Vit D, 25-Hydroxy: 50.3 ng/mL (ref 30.0–100.0)

## 2016-11-26 LAB — LYME AB/WESTERN BLOT REFLEX: LYME DISEASE AB, QUANT, IGM: <0.8 index (ref 0.00–0.79)

## 2016-11-26 LAB — ROCKY MTN SPOTTED FVR ABS PNL(IGG+IGM)
RMSF IGG: NEGATIVE
RMSF IGM: 0.18 {index} (ref 0.00–0.89)

## 2016-12-06 ENCOUNTER — Ambulatory Visit (INDEPENDENT_AMBULATORY_CARE_PROVIDER_SITE_OTHER): Payer: Medicare Other | Admitting: Family

## 2016-12-06 ENCOUNTER — Encounter: Payer: Self-pay | Admitting: Family

## 2016-12-06 VITALS — BP 152/85 | HR 78 | Temp 97.7°F | Ht 63.0 in | Wt 171.0 lb

## 2016-12-06 DIAGNOSIS — N3001 Acute cystitis with hematuria: Secondary | ICD-10-CM

## 2016-12-06 DIAGNOSIS — R05 Cough: Secondary | ICD-10-CM

## 2016-12-06 DIAGNOSIS — J301 Allergic rhinitis due to pollen: Secondary | ICD-10-CM

## 2016-12-06 DIAGNOSIS — R059 Cough, unspecified: Secondary | ICD-10-CM

## 2016-12-06 DIAGNOSIS — R109 Unspecified abdominal pain: Secondary | ICD-10-CM | POA: Diagnosis not present

## 2016-12-06 DIAGNOSIS — B373 Candidiasis of vulva and vagina: Secondary | ICD-10-CM | POA: Diagnosis not present

## 2016-12-06 DIAGNOSIS — B3731 Acute candidiasis of vulva and vagina: Secondary | ICD-10-CM

## 2016-12-06 MED ORDER — NITROFURANTOIN MONOHYD MACRO 100 MG PO CAPS: 100.0000 mg | ORAL_CAPSULE | Freq: Two times a day (BID) | ORAL | 0 refills | Status: DC

## 2016-12-06 MED ORDER — CETIRIZINE HCL 5 MG PO CHEW: 5.0000 mg | CHEWABLE_TABLET | Freq: Every day | ORAL | 1 refills | Status: DC

## 2016-12-06 MED ORDER — FLUCONAZOLE 150 MG PO TABS: 150.0000 mg | ORAL_TABLET | ORAL | 0 refills | Status: DC | PRN

## 2016-12-06 MED ORDER — FLUTICASONE PROPIONATE 50 MCG/ACT NA SUSP: 2.0000 | Freq: Every day | NASAL | 0 refills | Status: DC | PRN

## 2016-12-06 NOTE — Patient Instructions (Signed)
Allergic Rhinitis Allergic rhinitis is when the mucous membranes in the nose respond to allergens. Allergens are particles in the air that cause your body to have an allergic reaction. This causes you to release allergic antibodies. Through a chain of events, these eventually cause you to release histamine into the blood stream. Although meant to protect the body, it is this release of histamine that causes your discomfort, such as frequent sneezing, congestion, and an itchy, runny nose. What are the causes? Seasonal allergic rhinitis (hay fever) is caused by pollen allergens that may come from grasses, trees, and weeds. Year-round allergic rhinitis (perennial allergic rhinitis) is caused by allergens such as house dust mites, pet dander, and mold spores. What are the signs or symptoms?  Nasal stuffiness (congestion).  Itchy, runny nose with sneezing and tearing of the eyes. How is this diagnosed? Your health care provider can help you determine the allergen or allergens that trigger your symptoms. If you and your health care provider are unable to determine the allergen, skin or blood testing may be used. Your health care provider will diagnose your condition after taking your health history and performing a physical exam. Your health care provider may assess you for other related conditions, such as asthma, pink eye, or an ear infection. How is this treated? Allergic rhinitis does not have a cure, but it can be controlled by:  Medicines that block allergy symptoms. These may include allergy shots, nasal sprays, and oral antihistamines.  Avoiding the allergen. Hay fever may often be treated with antihistamines in pill or nasal spray forms. Antihistamines block the effects of histamine. There are over-the-counter medicines that may help with nasal congestion and swelling around the eyes. Check with your health care provider before taking or giving this medicine. If avoiding the allergen or the  medicine prescribed do not work, there are many new medicines your health care provider can prescribe. Stronger medicine may be used if initial measures are ineffective. Desensitizing injections can be used if medicine and avoidance does not work. Desensitization is when a patient is given ongoing shots until the body becomes less sensitive to the allergen. Make sure you follow up with your health care provider if problems continue. Follow these instructions at home: It is not possible to completely avoid allergens, but you can reduce your symptoms by taking steps to limit your exposure to them. It helps to know exactly what you are allergic to so that you can avoid your specific triggers. Contact a health care provider if:  You have a fever.  You develop a cough that does not stop easily (persistent).  You have shortness of breath.  You start wheezing.  Symptoms interfere with normal daily activities. This information is not intended to replace advice given to you by your health care provider. Make sure you discuss any questions you have with your health care provider. Document Released: 05/06/2001 Document Revised: 04/11/2016 Document Reviewed: 04/18/2013 Elsevier Interactive Patient Education  2017 Elsevier Inc.  

## 2016-12-06 NOTE — Progress Notes (Signed)
Subjective:    Patient ID: Debra Phillips, female    DOB: [birth year], 83 y.o.   MRN: [id number]  Cough  This is a new problem. The current episode started yesterday. The problem has been unchanged. The cough is non-productive. Associated symptoms include nasal congestion, postnasal drip and rhinorrhea. Pertinent negatives include no ear congestion or ear pain. Associated symptoms comments: Sneezing . The symptoms are aggravated by lying down. She has tried nothing for the symptoms.  Dysuria   This is a recurrent problem. The current episode started 1 to 4 weeks ago. The problem occurs every urination. The problem has been waxing and waning. The quality of the pain is described as burning. The pain is moderate. Associated symptoms include frequency and urgency. Pertinent negatives include no hematuria, nausea or vomiting. She has tried antibiotics for the symptoms. The treatment provided mild relief.      Review of Systems  HENT: Positive for postnasal drip and rhinorrhea. Negative for ear pain.   Respiratory: Positive for cough.   Gastrointestinal: Negative for nausea and vomiting.  Genitourinary: Positive for dysuria, frequency and urgency. Negative for hematuria.  All other systems reviewed and are negative.      Objective:   Physical Exam  Constitutional: She is oriented to person, place, and time. She appears well-developed and well-nourished. No distress.  HENT:  Head: Normocephalic and atraumatic.  Right Ear: External ear normal.  Left Ear: External ear normal.  Nose: Mucosal edema and rhinorrhea present.  Mouth/Throat: Posterior oropharyngeal erythema present.  Eyes: Pupils are equal, round, and reactive to light.  Neck: Normal range of motion. Neck supple. No thyromegaly present.  Cardiovascular: Normal rate, regular rhythm, normal heart sounds and intact distal pulses.   No murmur heard. Pulmonary/Chest: Effort normal and breath sounds normal. No respiratory distress.  She has no wheezes.  Dry intermittent cough   Abdominal: Soft. Bowel sounds are normal. She exhibits no distension. There is tenderness (mild tenderness lower abd).  Musculoskeletal: Normal range of motion. She exhibits no edema or tenderness.  Neurological: She is alert and oriented to person, place, and time. She has normal reflexes. No cranial nerve deficit.  Skin: Skin is warm and dry.  Psychiatric: She has a normal mood and affect. Her behavior is normal. Judgment and thought content normal.  Vitals reviewed.   BP (!) 152/85   Pulse 78   Temp 97.7 F (36.5 C) (Oral)   Ht 5\' 3"  (1.6 m)   Wt 171 lb (77.6 kg)   BMI 30.29 kg/m        Assessment & Plan:  1. Abdominal pressure - Urinalysis - Urine culture  2. Acute allergic rhinitis due to pollen, unspecified seasonality - fluticasone (FLONASE) 50 MCG/ACT nasal spray; Place 2 sprays into both nostrils daily as needed for allergies or rhinitis.  Dispense: 16 g; Refill: 0 - cetirizine (ZYRTEC CHILDRENS ALLERGY) 5 MG chewable tablet; Chew 1 tablet (5 mg total) by mouth daily.  Dispense: 90 tablet; Refill: 1  3. Cough - fluticasone (FLONASE) 50 MCG/ACT nasal spray; Place 2 sprays into both nostrils daily as needed for allergies or rhinitis.  Dispense: 16 g; Refill: 0 - cetirizine (ZYRTEC CHILDRENS ALLERGY) 5 MG chewable tablet; Chew 1 tablet (5 mg total) by mouth daily.  Dispense: 90 tablet; Refill: 1  4. Vagina, candidiasis -Keep clean and dry - fluconazole (DIFLUCAN) 150 MG tablet; Take 1 tablet (150 mg total) by mouth every three (3) days as needed.  Dispense: 2 tablet; Refill:  0  5. Acute cystitis with hematuria Force fluids AZO over the counter X2 days RTO prn Culture pending - nitrofurantoin, macrocrystal-monohydrate, (MACROBID) 100 MG capsule; Take 1 capsule (100 mg total) by mouth 2 (two) times daily.  Dispense: 10 capsule; Refill: 0   Evelina Dun, FNP

## 2016-12-07 LAB — URINE CULTURE

## 2016-12-08 ENCOUNTER — Other Ambulatory Visit: Payer: Self-pay | Admitting: Family Medicine

## 2016-12-08 LAB — URINALYSIS
Bilirubin, UA: NEGATIVE
Glucose, UA: NEGATIVE
KETONES UA: NEGATIVE
Nitrite, UA: NEGATIVE
PH UA: 8.5 — AB (ref 5.0–7.5)
PROTEIN UA: NEGATIVE
SPEC GRAV UA: 1.015 (ref 1.005–1.030)
Urobilinogen, Ur: 0.2 mg/dL (ref 0.2–1.0)

## 2016-12-09 ENCOUNTER — Telehealth: Payer: Self-pay | Admitting: *Deleted

## 2016-12-09 NOTE — Telephone Encounter (Signed)
No known fever Cough- bad and keeps awake at nigh Some congestion and sinus drainage and pressure  Wants zpak and something for cough - going out of town in a few days. [employer]

## 2016-12-10 MED ORDER — AZITHROMYCIN 250 MG PO TABS: ORAL_TABLET | ORAL | 0 refills | Status: DC

## 2016-12-10 MED ORDER — BENZONATATE 200 MG PO CAPS: 200.0000 mg | ORAL_CAPSULE | Freq: Three times a day (TID) | ORAL | 1 refills | Status: DC | PRN

## 2016-12-10 NOTE — Telephone Encounter (Signed)
Patient aware.

## 2016-12-10 NOTE — Telephone Encounter (Signed)
Zpak and tessalon Prescription sent to pharmacy

## 2016-12-17 DIAGNOSIS — M48061 Spinal stenosis, lumbar region without neurogenic claudication: Secondary | ICD-10-CM | POA: Diagnosis not present

## 2016-12-18 ENCOUNTER — Other Ambulatory Visit: Payer: Self-pay | Admitting: Family Medicine

## 2016-12-24 ENCOUNTER — Other Ambulatory Visit: Payer: Self-pay | Admitting: Family Medicine

## 2017-01-02 ENCOUNTER — Other Ambulatory Visit: Payer: Self-pay | Admitting: Family Medicine

## 2017-01-12 ENCOUNTER — Other Ambulatory Visit: Payer: Self-pay | Admitting: Family Medicine

## 2017-01-22 ENCOUNTER — Encounter: Payer: Self-pay | Admitting: Family Medicine

## 2017-01-22 ENCOUNTER — Ambulatory Visit (INDEPENDENT_AMBULATORY_CARE_PROVIDER_SITE_OTHER): Payer: Medicare Other | Admitting: Family Medicine

## 2017-01-22 VITALS — BP 138/83 | HR 82 | Temp 97.7°F | Ht 63.0 in | Wt 168.0 lb

## 2017-01-22 DIAGNOSIS — R300 Dysuria: Secondary | ICD-10-CM

## 2017-01-22 LAB — URINALYSIS, COMPLETE
Bilirubin, UA: NEGATIVE
Glucose, UA: NEGATIVE
Ketones, UA: NEGATIVE
LEUKOCYTES UA: NEGATIVE
Nitrite, UA: NEGATIVE
PH UA: 6.5 (ref 5.0–7.5)
PROTEIN UA: NEGATIVE
RBC UA: NEGATIVE
Specific Gravity, UA: 1.01 (ref 1.005–1.030)
Urobilinogen, Ur: 0.2 mg/dL (ref 0.2–1.0)

## 2017-01-22 LAB — MICROSCOPIC EXAMINATION
BACTERIA UA: NONE SEEN
RBC, UA: NONE SEEN /hpf (ref 0–?)
RENAL EPITHEL UA: NONE SEEN /HPF

## 2017-01-22 MED ORDER — CEFDINIR 300 MG PO CAPS: 300.0000 mg | ORAL_CAPSULE | Freq: Two times a day (BID) | ORAL | 0 refills | Status: DC

## 2017-01-22 NOTE — Progress Notes (Signed)
BP 138/83   Pulse 82   Temp 97.7 F (36.5 C) (Oral)   Ht 5\' 3"  (1.6 m)   Wt 168 lb (76.2 kg)   BMI 29.76 kg/m    Subjective:    Patient ID: Debra Phillips, female    DOB: 09-Oct-1928, 83 y.o.   MRN: [id number]  HPI: ASHAUNTE Phillips is a 83 y.o. female presenting on 01/22/2017 for Abdominal Pain (history of frequent uti's; has a urology appointment soon, unsure of date) and Fatigue (for the last 2 weeks, no energy, doesn't feel like doing anything)   HPI Dysuria and generalized weakness and feeling ill Patient has been having dysuria and generalized feeling ill and weak that has been increasing over the past week. She says she gets like this frequently when she gets urinary tract infections and she has been feeling some dysuria and burning that she keeps most of the time. She denies any fevers or flank pain but does have some lower abdominal pains. She has a urology appointment coming up because of the frequent UTIs and did have a bladder sling placed last year but she does not know the date when she is seeing urology.  Relevant past medical, surgical, family and social history reviewed and updated as indicated. Interim medical history since our last visit reviewed. Allergies and medications reviewed and updated.  Review of Systems  Constitutional: Positive for fatigue. Negative for chills and fever.  HENT: Negative for congestion, ear discharge and ear pain.   Eyes: Negative for redness and visual disturbance.  Respiratory: Negative for chest tightness and shortness of breath.   Cardiovascular: Negative for chest pain and leg swelling.  Gastrointestinal: Positive for abdominal pain. Negative for abdominal distention, blood in stool, constipation, diarrhea, nausea and vomiting.  Genitourinary: Positive for dysuria, frequency and urgency. Negative for decreased urine volume, difficulty urinating, flank pain, hematuria, vaginal bleeding, vaginal discharge and vaginal pain.    Musculoskeletal: Negative for back pain and gait problem.  Skin: Negative for rash.  Neurological: Negative for light-headedness and headaches.  Psychiatric/Behavioral: Negative for agitation and behavioral problems.  All other systems reviewed and are negative.   Per HPI unless specifically indicated above        Objective:    BP 138/83   Pulse 82   Temp 97.7 F (36.5 C) (Oral)   Ht 5\' 3"  (1.6 m)   Wt 168 lb (76.2 kg)   BMI 29.76 kg/m   Wt Readings from Last 3 Encounters:  01/22/17 168 lb (76.2 kg)  12/06/16 171 lb (77.6 kg)  11/14/16 167 lb (75.8 kg)    Physical Exam  Constitutional: She is oriented to person, place, and time. She appears well-developed and well-nourished. No distress.  Eyes: Conjunctivae are normal.  Cardiovascular: Normal rate, regular rhythm, normal heart sounds and intact distal pulses.   No murmur heard. Pulmonary/Chest: Effort normal and breath sounds normal. No respiratory distress. She has no wheezes. She has no rales.  Abdominal: Soft. Bowel sounds are normal. She exhibits no distension. There is no hepatosplenomegaly. There is tenderness in the right lower quadrant, suprapubic area and left lower quadrant. There is no rigidity, no rebound, no guarding and no CVA tenderness.  Musculoskeletal: Normal range of motion. She exhibits no edema or tenderness.  Neurological: She is alert and oriented to person, place, and time. Coordination normal.  Skin: Skin is warm and dry. No rash noted. She is not diaphoretic.  Psychiatric: She has a normal mood and affect. Her  behavior is normal.  Nursing note and vitals reviewed.   Urinalysis: 0-5 WBCs, no RBCs, 0-10 epithelial cells, otherwise negative urinalysis.    Assessment & Plan:   Problem List Items Addressed This Visit      Other   Dysuria - Primary   Relevant Medications   cefdinir (OMNICEF) 300 MG capsule   Other Relevant Orders   Urinalysis, Complete   Urine culture       Follow up  plan: Return if symptoms worsen or fail to improve.  Counseling provided for all of the vaccine components Orders Placed This Encounter  Procedures  . Urine culture  . Urinalysis, Complete    Caryl Pina, MD [facility] 01/22/2017, 2:55 PM

## 2017-01-23 ENCOUNTER — Telehealth: Payer: Self-pay | Admitting: Family Medicine

## 2017-01-23 NOTE — Telephone Encounter (Signed)
Patient aware, will call with culture results

## 2017-01-24 LAB — URINE CULTURE

## 2017-01-26 ENCOUNTER — Telehealth: Payer: Self-pay | Admitting: Family Medicine

## 2017-01-26 ENCOUNTER — Telehealth: Payer: Self-pay | Admitting: *Deleted

## 2017-01-26 NOTE — Addendum Note (Signed)
Addendum  created 01/26/17 0951 by Myrtie Soman, MD   Sign clinical note

## 2017-01-26 NOTE — Telephone Encounter (Signed)
erroneuos visit

## 2017-01-26 NOTE — Telephone Encounter (Signed)
Pt notified of results Verbalizes understanding Pt also notified to pick up antibiotic

## 2017-02-17 ENCOUNTER — Telehealth: Payer: Self-pay | Admitting: *Deleted

## 2017-02-17 DIAGNOSIS — S0101XA Laceration without foreign body of scalp, initial encounter: Secondary | ICD-10-CM | POA: Diagnosis not present

## 2017-02-17 DIAGNOSIS — Z23 Encounter for immunization: Secondary | ICD-10-CM | POA: Diagnosis not present

## 2017-02-17 NOTE — Telephone Encounter (Signed)
Patient's son called stating that patient fell today and cut the back of her head.  Son states that patient has a few cuts and is bleeding a "decent amount" .  Informed son that patient will need to go to the ED to be seen patient will need a CT scan.  Son verbalized understanding.

## 2017-03-19 DIAGNOSIS — M436 Torticollis: Secondary | ICD-10-CM | POA: Diagnosis not present

## 2017-03-25 ENCOUNTER — Encounter: Payer: Self-pay | Admitting: Family Medicine

## 2017-03-25 ENCOUNTER — Ambulatory Visit (INDEPENDENT_AMBULATORY_CARE_PROVIDER_SITE_OTHER): Payer: Medicare Other | Admitting: Family Medicine

## 2017-03-25 VITALS — BP 156/94 | HR 69 | Temp 97.2°F | Ht 63.0 in | Wt 166.0 lb

## 2017-03-25 DIAGNOSIS — R300 Dysuria: Secondary | ICD-10-CM | POA: Diagnosis not present

## 2017-03-25 DIAGNOSIS — I10 Essential (primary) hypertension: Secondary | ICD-10-CM | POA: Diagnosis not present

## 2017-03-25 DIAGNOSIS — K219 Gastro-esophageal reflux disease without esophagitis: Secondary | ICD-10-CM | POA: Diagnosis not present

## 2017-03-25 DIAGNOSIS — E119 Type 2 diabetes mellitus without complications: Secondary | ICD-10-CM | POA: Diagnosis not present

## 2017-03-25 DIAGNOSIS — M4726 Other spondylosis with radiculopathy, lumbar region: Secondary | ICD-10-CM | POA: Diagnosis not present

## 2017-03-25 DIAGNOSIS — E559 Vitamin D deficiency, unspecified: Secondary | ICD-10-CM

## 2017-03-25 LAB — URINALYSIS, COMPLETE
Bilirubin, UA: NEGATIVE
Glucose, UA: NEGATIVE
KETONES UA: NEGATIVE
NITRITE UA: NEGATIVE
Protein, UA: NEGATIVE
RBC, UA: NEGATIVE
Specific Gravity, UA: 1.015 (ref 1.005–1.030)
UUROB: 0.2 mg/dL (ref 0.2–1.0)
pH, UA: 7 (ref 5.0–7.5)

## 2017-03-25 LAB — MICROSCOPIC EXAMINATION: Renal Epithel, UA: NONE SEEN /hpf

## 2017-03-25 LAB — BAYER DCA HB A1C WAIVED: HB A1C (BAYER DCA - WAIVED): 6.9 % (ref <7.0)

## 2017-03-25 NOTE — Patient Instructions (Addendum)
Medicare Annual Wellness Visit  [facility] and the medical providers at [facility] strive to bring you the best medical care.  In doing so we not only want to address your current medical conditions and concerns but also to detect new conditions early and prevent illness, disease and health-related problems.    Medicare offers a yearly Wellness Visit which allows our clinical staff to assess your need for preventative services including immunizations, lifestyle education, counseling to decrease risk of preventable diseases and screening for fall risk and other medical concerns.    This visit is provided free of charge (no copay) for all Medicare recipients. The clinical pharmacists at [facility] have begun to conduct these Wellness Visits which will also include a thorough review of all your medications.    As you primary medical provider recommend that you make an appointment for your Annual Wellness Visit if you have not done so already this year.  You may set up this appointment before you leave today or you may call back ([phone]) and schedule an appointment.  Please make sure when you call that you mention that you are scheduling your Annual Wellness Visit with the clinical pharmacist so that the appointment may be made for the proper length of time.     Continue current medications. Continue good therapeutic lifestyle changes which include good diet and exercise. Fall precautions discussed with patient. If an FOBT was given today- please return it to our front desk. If you are over 63 years old - you may need Prevnar 62 or the adult Pneumonia vaccine.  **Flu shots are available--- please call and schedule a FLU-CLINIC appointment**  After your visit with [place] today you will receive a survey in the mail or online from [employer] regarding your care with [place]. Please take a moment to fill this out. Your feedback is very  important to [place] as you can help [place] better understand your patient needs as well as improve your experience and satisfaction. WE CARE ABOUT YOU!!!   We will call you with your lab work results as soon as this becomes available Try to limit extreme physical activity and always have someone accompany you if you're working in her garden and not be there by yourself Stay is active physically as possible Don't climb Monitor blood sugars at home Stay on the Celebrex for 7-10 days and then try to leave it off and only take it as needed

## 2017-03-25 NOTE — Progress Notes (Signed)
Subjective:    Patient ID: Debra Phillips, female    DOB: 04-Aug-1929, 83 y.o.   MRN: [id number]  HPI Pt here for follow up and management of chronic medical problems which includes diabetes, hypertension and gerd. She is taking medication regularly.The patient is doing well overall other than weakness some dysuria and abdominal pain and some hot flashes at times. She will get lab work today. The patient has started some Celebrex. Her last creatinine was normal in April. The patient today says she's been down a lot with her back and can do very little without having more back pain. This is been an ongoing problem for her. She has seen the neurosurgeon but he has told her that they can do no more surgery. She was started on some Celebrex just recently 100 mg daily. She understands she has to be careful with this because it can affect her stoma raise her blood pressure and cause edema and affect the liver. She is trying to stay active and her mind would like for her to be more active than her body will let her be. She has had a couple of falls. She denies any chest pain. She has some shortness of breath with continued activity. She does feel sort of queasy in her stomach at times but has not had any blood in the stool or black tarry bowel movement and tends to have constipation. She is passing her water currently okay other than some dysuria with the abdominal pain. Repeat blood pressure today was 156/94 in the right arm with the patient sitting. Patient is in good spirits. She signed fall for her family and to be in the help she is in even though she has a lot of pain with her back and is limited physically.    Patient Active Problem List   Diagnosis Date Noted  . Dysuria 01/22/2017  . Pelvic prolapse 08/28/2016  . Tremor 01/29/2016  . Abnormality of gait 01/29/2016  . Type 2 diabetes mellitus with hyperlipidemia ([place]) 01/14/2016  . Osteoarthritis of lumbar spine 05/08/2015  . Spondylosis of  lumbar region without myelopathy or radiculopathy 09/01/2014  . Diabetes type 2, controlled ([place]) 04/26/2014  . Vitamin D deficiency 04/26/2014  . Chronic cystitis 04/26/2014  . Atrophic vaginitis 04/26/2014  . Solitary pulmonary nodule 03/20/2014  . Chest pain 03/18/2014  . Osteoporotic compression fracture of spine with routine healing 03/08/2014  . PALPITATIONS 02/27/2010  . Coronary atherosclerosis 12/12/2009  . Aortic aneurysm of unspecified site without mention of rupture 06/27/2009  . DYSPNEA 06/27/2009  . DYSLIPIDEMIA 12/12/2008  . Essential hypertension 12/12/2008  . GERD 12/12/2008  . OSTEOARTHRITIS 12/12/2008   Outpatient Encounter Prescriptions as of 03/25/2017  Medication Sig  . acetaminophen (TYLENOL) 650 MG CR tablet Take 1,300 mg by mouth every 8 (eight) hours as needed for pain.  . Calcium Carb-Cholecalciferol 600-500 MG-UNIT CAPS Take 1 capsule by mouth every morning.  . celecoxib (CELEBREX) 100 MG capsule Take 1 capsule by mouth daily.  . cetirizine (ZYRTEC CHILDRENS ALLERGY) 5 MG chewable tablet Chew 1 tablet (5 mg total) by mouth daily.  . Cholecalciferol 1000 UNITS tablet Take 1,000 Units by mouth daily.   Marland Kitchen CINNAMON PO Take 1 tablet by mouth daily.  Marland Kitchen diltiazem (CARDIZEM CD) 180 MG 24 hr capsule TAKE (1) CAPSULE DAILY  . ESTRACE VAGINAL 0.1 MG/GM vaginal cream Place 1 Applicatorful vaginally once a week.   . fluticasone (FLONASE) 50 MCG/ACT nasal spray Place 2 sprays into both nostrils  daily as needed for allergies or rhinitis.  . furosemide (LASIX) 20 MG tablet Take 20 mg by mouth daily as needed for fluid or edema.   Marland Kitchen lisinopril-hydrochlorothiazide (PRINZIDE,ZESTORETIC) 10-12.5 MG tablet Take 2 tablets by mouth daily. For blood pressure  . metFORMIN (GLUCOPHAGE) 500 MG tablet TAKE (1) TABLET TWICE A DAY.  . Multiple Vitamins-Minerals (ICAPS AREDS 2 PO) Take 1 capsule by mouth daily.   Marland Kitchen MYRBETRIQ 25 MG TB24 tablet Take 25 mg by mouth daily.   . Omega-3 Fatty  Acids (FISH OIL) 1000 MG CAPS Take 1,000 mg by mouth at bedtime.   Marland Kitchen omeprazole (PRILOSEC) 40 MG capsule Take 1 capsule (40 mg total) by mouth 2 (two) times daily.  . ONE TOUCH ULTRA TEST test strip CHECK BLOOD SUGAR ONCE A DAY  . potassium chloride (K-DUR) 10 MEQ tablet TAKE (1) TABLET DAILY AS DIRECTED.  Marland Kitchen nitroGLYCERIN (NITROSTAT) 0.4 MG SL tablet Place 1 tablet (0.4 mg total) under the tongue every 5 (five) minutes as needed for chest pain. (Patient not taking: Reported on 03/25/2017)  . [DISCONTINUED] azithromycin (ZITHROMAX) 250 MG tablet Take 500 mg once, then 250 mg for four days  . [DISCONTINUED] benzonatate (TESSALON) 200 MG capsule Take 1 capsule (200 mg total) by mouth 3 (three) times daily as needed.  . [DISCONTINUED] cefdinir (OMNICEF) 300 MG capsule Take 1 capsule (300 mg total) by mouth 2 (two) times daily. 1 po BID  . [DISCONTINUED] Probiotic Product (PROBIOTIC DAILY PO) Take 1 capsule by mouth daily.  . [DISCONTINUED] trimethoprim (TRIMPEX) 100 MG tablet Take 1 tablet (100 mg total) by mouth at bedtime.   No facility-administered encounter medications on file as of 03/25/2017.       Review of Systems  Constitutional: Positive for fatigue.  HENT: Negative.   Eyes: Negative.   Respiratory: Negative.   Cardiovascular: Negative.   Gastrointestinal: Negative.   Endocrine: Negative.        Hot / sweats at times  Genitourinary: Positive for dysuria (with abd pain).  Musculoskeletal: Negative.   Skin: Negative.   Allergic/Immunologic: Negative.   Neurological: Positive for weakness (no strength).  Hematological: Negative.   Psychiatric/Behavioral: Negative.        Objective:   Physical Exam  Constitutional: She is oriented to person, place, and time. She appears well-developed and well-nourished. No distress.  The patient is alert and spunky as usual. As mentioned earlier her mind is a lot younger than her body.  HENT:  Head: Normocephalic.  Right Ear: External ear  normal.  Left Ear: External ear normal.  Nose: Nose normal.  Mouth/Throat: Oropharynx is clear and moist. No oropharyngeal exudate.  Eyes: Pupils are equal, round, and reactive to light. Conjunctivae and EOM are normal. Right eye exhibits no discharge. Left eye exhibits no discharge. No scleral icterus.  Neck: Normal range of motion. Neck supple. No thyromegaly present.  No bruits or thyromegaly or anterior cervical adenopathy  Cardiovascular: Normal rate, regular rhythm, normal heart sounds and intact distal pulses.   No murmur heard. The heart is regular at 60/m  Pulmonary/Chest: Effort normal and breath sounds normal. No respiratory distress. She has no wheezes. She has no rales.  Clear anteriorly and posteriorly  Abdominal: Soft. Bowel sounds are normal. She exhibits no mass. There is tenderness. There is no rebound and no guarding.  Slight suprapubic and lower abdominal tenderness. No liver or spleen enlargement. No masses palpable.  Musculoskeletal: She exhibits no edema or tenderness.  The patient has a kyphotic  posture. She uses a cane for ambulation. She's had bilateral knee replacement.  Lymphadenopathy:    She has no cervical adenopathy.  Neurological: She is alert and oriented to person, place, and time. She has normal reflexes. No cranial nerve deficit.  Skin: Skin is warm and dry. No rash noted.  Psychiatric: She has a normal mood and affect. Her behavior is normal. Judgment and thought content normal.  Nursing note and vitals reviewed.   BP (!) 143/75 (BP Location: Left Arm)   Pulse 69   Temp (!) 97.2 F (36.2 C) (Oral)   Ht '5\' 3"'  (1.6 m)   Wt 166 lb (75.3 kg)   BMI 29.41 kg/m        Assessment & Plan:  1. Controlled type 2 diabetes mellitus without complication, without long-term current use of insulin ([place]) -Continue with current treatment pending results of lab work - CBC with Differential/Platelet - Lipid panel - Bayer DCA Hb A1c Waived - Microalbumin /  creatinine urine ratio  2. Vitamin D deficiency -Continue with current treatment pending results of lab work - CBC with Differential/Platelet - VITAMIN D 25 Hydroxy (Vit-D Deficiency, Fractures)  3. Hypertension -The blood pressure was elevated today on 2 occasions. It is borderline and we will not increase the medication based on the readings today. She will continue to watch her sodium intake. - CBC with Differential/Platelet - BMP8+EGFR - Lipid panel - Hepatic function panel  4. Gastroesophageal reflux disease, esophagitis presence not specified -She will continue with her omeprazole and limit the use of taking the Celebrex that she is taking and only take it for short period time. This was prescribed by another physician. - CBC with Differential/Platelet - Hepatic function panel  5. Dysuria -Continue to drink plenty of fluids - CBC with Differential/Platelet - Urinalysis, Complete - Urine Culture  6. Osteoarthritis of spine with radiculopathy, lumbar region -Rely on Tylenol as much as possible for pain control instead of anti-inflammatory medicines. -Limit Celebrex intake and always take it either during a meal or after eating. After taking this regularly for a few days try to go back to exercising Tylenol.  Patient Instructions                       Medicare Annual Wellness Visit  [facility] and the medical providers at [facility] strive to bring you the best medical care.  In doing so we not only want to address your current medical conditions and concerns but also to detect new conditions early and prevent illness, disease and health-related problems.    Medicare offers a yearly Wellness Visit which allows our clinical staff to assess your need for preventative services including immunizations, lifestyle education, counseling to decrease risk of preventable diseases and screening for fall risk and other medical concerns.    This visit is provided  free of charge (no copay) for all Medicare recipients. The clinical pharmacists at [facility] have begun to conduct these Wellness Visits which will also include a thorough review of all your medications.    As you primary medical provider recommend that you make an appointment for your Annual Wellness Visit if you have not done so already this year.  You may set up this appointment before you leave today or you may call back ([phone]) and schedule an appointment.  Please make sure when you call that you mention that you are scheduling your Annual Wellness Visit with the clinical pharmacist  so that the appointment may be made for the proper length of time.     Continue current medications. Continue good therapeutic lifestyle changes which include good diet and exercise. Fall precautions discussed with patient. If an FOBT was given today- please return it to our front desk. If you are over 50 years old - you may need Prevnar 34 or the adult Pneumonia vaccine.  **Flu shots are available--- please call and schedule a FLU-CLINIC appointment**  After your visit with [place] today you will receive a survey in the mail or online from [employer] regarding your care with [place]. Please take a moment to fill this out. Your feedback is very important to [place] as you can help [place] better understand your patient needs as well as improve your experience and satisfaction. WE CARE ABOUT YOU!!!   We will call you with your lab work results as soon as this becomes available Try to limit extreme physical activity and always have someone accompany you if you're working in her garden and not be there by yourself Stay is active physically as possible Don't climb Monitor blood sugars at home Stay on the Celebrex for 7-10 days and then try to leave it off and only take it as needed    Arrie Senate MD

## 2017-03-26 LAB — CBC WITH DIFFERENTIAL/PLATELET
BASOS: 1 %
Basophils Absolute: 0 10*3/uL (ref 0.0–0.2)
EOS (ABSOLUTE): 0.1 10*3/uL (ref 0.0–0.4)
Eos: 2 %
HEMATOCRIT: 41.2 % (ref 34.0–46.6)
Hemoglobin: 13.8 g/dL (ref 11.1–15.9)
IMMATURE GRANS (ABS): 0 10*3/uL (ref 0.0–0.1)
Immature Granulocytes: 0 %
Lymphocytes Absolute: 2.3 10*3/uL (ref 0.7–3.1)
Lymphs: 37 %
MCH: 30.2 pg (ref 26.6–33.0)
MCHC: 33.5 g/dL (ref 31.5–35.7)
MCV: 90 fL (ref 79–97)
MONOS ABS: 0.5 10*3/uL (ref 0.1–0.9)
Monocytes: 8 %
NEUTROS ABS: 3.3 10*3/uL (ref 1.4–7.0)
Neutrophils: 52 %
PLATELETS: 360 10*3/uL (ref 150–379)
RBC: 4.57 x10E6/uL (ref 3.77–5.28)
RDW: 14.2 % (ref 12.3–15.4)
WBC: 6.2 10*3/uL (ref 3.4–10.8)

## 2017-03-26 LAB — LIPID PANEL
CHOL/HDL RATIO: 5.3 ratio — AB (ref 0.0–4.4)
Cholesterol, Total: 210 mg/dL — ABNORMAL HIGH (ref 100–199)
HDL: 40 mg/dL (ref >39)
LDL CALC: 115 mg/dL — AB (ref 0–99)
Triglycerides: 276 mg/dL — ABNORMAL HIGH (ref 0–149)
VLDL Cholesterol Cal: 55 mg/dL — ABNORMAL HIGH (ref 5–40)

## 2017-03-26 LAB — BMP8+EGFR
BUN / CREAT RATIO: 18 (ref 12–28)
BUN: 12 mg/dL (ref 8–27)
CALCIUM: 9.6 mg/dL (ref 8.7–10.3)
CHLORIDE: 95 mmol/L — AB (ref 96–106)
CO2: 23 mmol/L (ref 20–29)
Creatinine, Ser: 0.68 mg/dL (ref 0.57–1.00)
GFR, EST AFRICAN AMERICAN: 92 mL/min/{1.73_m2} (ref >59)
GFR, EST NON AFRICAN AMERICAN: 79 mL/min/{1.73_m2} (ref >59)
Glucose: 153 mg/dL — ABNORMAL HIGH (ref 65–99)
Potassium: 4.8 mmol/L (ref 3.5–5.2)
Sodium: 138 mmol/L (ref 134–144)

## 2017-03-26 LAB — HEPATIC FUNCTION PANEL
ALT: 19 IU/L (ref 0–32)
AST: 22 IU/L (ref 0–40)
Albumin: 4.2 g/dL (ref 3.5–4.7)
Alkaline Phosphatase: 67 IU/L (ref 39–117)
Bilirubin Total: 0.4 mg/dL (ref 0.0–1.2)
Bilirubin, Direct: 0.12 mg/dL (ref 0.00–0.40)
Total Protein: 7.1 g/dL (ref 6.0–8.5)

## 2017-03-26 LAB — VITAMIN D 25 HYDROXY (VIT D DEFICIENCY, FRACTURES): VIT D 25 HYDROXY: 44.1 ng/mL (ref 30.0–100.0)

## 2017-03-26 LAB — MICROALBUMIN / CREATININE URINE RATIO
Creatinine, Urine: 67.9 mg/dL
MICROALB/CREAT RATIO: 10.8 mg/g{creat} (ref 0.0–30.0)
Microalbumin, Urine: 7.3 ug/mL

## 2017-03-27 ENCOUNTER — Other Ambulatory Visit: Payer: Self-pay | Admitting: *Deleted

## 2017-03-27 ENCOUNTER — Telehealth: Payer: Self-pay

## 2017-03-27 MED ORDER — ROSUVASTATIN CALCIUM 5 MG PO TABS: 5.0000 mg | ORAL_TABLET | Freq: Every day | ORAL | 1 refills | Status: DC

## 2017-03-27 NOTE — Telephone Encounter (Signed)
Patients insurance does not cover Rosuvastatin  Preferred are Simvastatin Atorvastatin Pravastatin and Lovastatin

## 2017-03-27 NOTE — Telephone Encounter (Signed)
Do pravastatin 40 1 daily at hs [facility] 4-6 weeks

## 2017-03-28 LAB — URINE CULTURE

## 2017-03-30 ENCOUNTER — Other Ambulatory Visit: Payer: Self-pay | Admitting: Family Medicine

## 2017-03-30 ENCOUNTER — Other Ambulatory Visit: Payer: Self-pay | Admitting: *Deleted

## 2017-03-30 MED ORDER — CIPROFLOXACIN HCL 500 MG PO TABS: 500.0000 mg | ORAL_TABLET | Freq: Two times a day (BID) | ORAL | 0 refills | Status: DC

## 2017-03-30 MED ORDER — PRAVASTATIN SODIUM 40 MG PO TABS: 40.0000 mg | ORAL_TABLET | Freq: Every day | ORAL | 1 refills | Status: DC

## 2017-03-30 NOTE — Addendum Note (Signed)
Addended by: Zannie Cove on: 03/30/2017 08:26 AM   Modules accepted: Orders

## 2017-03-30 NOTE — Telephone Encounter (Signed)
Sent new med to [facility]

## 2017-04-06 DIAGNOSIS — H353131 Nonexudative age-related macular degeneration, bilateral, early dry stage: Secondary | ICD-10-CM | POA: Diagnosis not present

## 2017-04-13 ENCOUNTER — Other Ambulatory Visit: Payer: Medicare Other

## 2017-04-13 ENCOUNTER — Telehealth: Payer: Self-pay | Admitting: Family Medicine

## 2017-04-13 DIAGNOSIS — Z87440 Personal history of urinary (tract) infections: Secondary | ICD-10-CM | POA: Diagnosis not present

## 2017-04-13 DIAGNOSIS — R300 Dysuria: Secondary | ICD-10-CM | POA: Diagnosis not present

## 2017-04-13 NOTE — Telephone Encounter (Signed)
Pt aware.

## 2017-04-16 LAB — URINE CULTURE

## 2017-05-28 ENCOUNTER — Other Ambulatory Visit: Payer: Self-pay | Admitting: Family

## 2017-05-28 ENCOUNTER — Other Ambulatory Visit: Payer: Self-pay | Admitting: Family Medicine

## 2017-05-28 DIAGNOSIS — R059 Cough, unspecified: Secondary | ICD-10-CM

## 2017-05-28 DIAGNOSIS — R05 Cough: Secondary | ICD-10-CM

## 2017-05-28 DIAGNOSIS — J301 Allergic rhinitis due to pollen: Secondary | ICD-10-CM

## 2017-06-12 ENCOUNTER — Other Ambulatory Visit: Payer: Self-pay | Admitting: Family Medicine

## 2017-06-18 ENCOUNTER — Ambulatory Visit (INDEPENDENT_AMBULATORY_CARE_PROVIDER_SITE_OTHER): Payer: Medicare Other

## 2017-06-18 DIAGNOSIS — Z23 Encounter for immunization: Secondary | ICD-10-CM | POA: Diagnosis not present

## 2017-07-07 DIAGNOSIS — H04123 Dry eye syndrome of bilateral lacrimal glands: Secondary | ICD-10-CM | POA: Diagnosis not present

## 2017-07-07 DIAGNOSIS — E119 Type 2 diabetes mellitus without complications: Secondary | ICD-10-CM | POA: Diagnosis not present

## 2017-07-07 DIAGNOSIS — H35372 Puckering of macula, left eye: Secondary | ICD-10-CM | POA: Diagnosis not present

## 2017-07-07 DIAGNOSIS — H353132 Nonexudative age-related macular degeneration, bilateral, intermediate dry stage: Secondary | ICD-10-CM | POA: Diagnosis not present

## 2017-07-07 LAB — HM DIABETES EYE EXAM

## 2017-07-13 ENCOUNTER — Other Ambulatory Visit: Payer: Self-pay | Admitting: Family Medicine

## 2017-07-17 ENCOUNTER — Other Ambulatory Visit: Payer: Self-pay | Admitting: Family Medicine

## 2017-07-20 ENCOUNTER — Encounter: Payer: Self-pay | Admitting: Family Medicine

## 2017-07-20 ENCOUNTER — Ambulatory Visit (INDEPENDENT_AMBULATORY_CARE_PROVIDER_SITE_OTHER): Payer: Medicare Other | Admitting: Family Medicine

## 2017-07-20 VITALS — BP 167/81 | HR 85 | Temp 97.1°F | Ht 63.0 in | Wt 170.0 lb

## 2017-07-20 DIAGNOSIS — M62838 Other muscle spasm: Secondary | ICD-10-CM | POA: Diagnosis not present

## 2017-07-20 DIAGNOSIS — M542 Cervicalgia: Secondary | ICD-10-CM | POA: Diagnosis not present

## 2017-07-20 DIAGNOSIS — R030 Elevated blood-pressure reading, without diagnosis of hypertension: Secondary | ICD-10-CM

## 2017-07-20 DIAGNOSIS — M4726 Other spondylosis with radiculopathy, lumbar region: Secondary | ICD-10-CM

## 2017-07-20 MED ORDER — METHYLPREDNISOLONE ACETATE 80 MG/ML IJ SUSP
40.0000 mg | Freq: Once | INTRAMUSCULAR | Status: AC
Start: 2017-07-20 — End: 2017-07-20
  Administered 2017-07-20: 40 mg via INTRAMUSCULAR

## 2017-07-20 NOTE — Progress Notes (Signed)
Subjective: CC: hip/ back pain PCP: Chipper Herb, MD VZD:GLOVFI Debra Phillips is a 83 y.o. female presenting to clinic today for:  1. Neck/ back pain Patient with a long-standing history of low back pain.  She has a past surgical history of low back surgery with lumbar fusion.  Her last PCP visit noted that she was not eligible for repeat back surgery.  She reports that low back pain has been worse on the left side.  She notes that she is also been having left-sided neck pain that radiates to her left upper back and left shoulder.  This is been going on for some time but has been worse over the last week or so.  She notes that she has been taking Celebrex 100 mg daily with little improvement in symptoms.  She was prescribed tramadol and baclofen.  She notes that she will occasionally take half a tablet of baclofen at nighttime but often will not take these medications because they cause her to be sleepy.  She is also been using Biofreeze, heat and Tylenol arthritis.  She notes some relief with Tylenol arthritis but is reluctant to take these throughout the day because again she does not want to be sleepy.  She denies numbness or tingling.  She does report decreased range of motion but this has been present for some time.  She notes a history of right shoulder surgery in the past.  She has had lumbar injections in the past but does not wish to revisit these.  No recent falls.  No weakness out of ordinary.  No new numbness or tingling.  No saddle anesthesia, urinary retention or fecal incontinence.  Allergies  Allergen Reactions  . Codeine Nausea And Vomiting  . Doxycycline Hives  . Other Nausea Only    Almost all antibiotics  . Sulfonamide Derivatives Hives  . Vancomycin Itching   Past Medical History:  Diagnosis Date  . Abnormality of gait 01/29/2016  . Aortic root dilatation (HCC)    Mild  . Cataract   . Diabetes mellitus without complication ([place])    type 2  . Dyslipidemia   . Dyspnea      with exertion  . Dysrhythmia    hx of occ palpitations  . Esophageal stricture    s/p dilation  . GERD (gastroesophageal reflux disease)   . Headache   . History of hiatal hernia   . Hyperlipidemia   . Hypertension   . Macular pucker, left eye   . Osteoarthritis   . Osteoporosis   . Tinnitus    left ear last week or so   Past Surgical History:  Procedure Laterality Date  . ABDOMINAL HYSTERECTOMY     partial  . APPENDECTOMY    . BACK SURGERY     lower x 1  . CARDIAC CATHETERIZATION  2010   both cataracts  . CHOLECYSTECTOMY  1993  . CYSTOSCOPY N/A 08/28/2016   Procedure: CYSTOSCOPY;  Surgeon: Carolan Clines, MD;  Location: [facility];  Service: Urology;  Laterality: N/A;  . EYE SURGERY     cataracts  . EYE SURGERY     for macular pucker  . FRACTURE SURGERY     rt wrist2010  . HARDWARE REMOVAL  12/10/2011   Procedure: HARDWARE REMOVAL;  Surgeon: Schuyler Amor, MD;  Location: [facility];  Service: Orthopedics;  Laterality: Right;  radial head  . KNEE ARTHROSCOPY  2004   Left and right  . KYPHOSIS SURGERY    .  LUMBAR FUSION  [id number]  . RADIAL HEAD ARTHROPLASTY  5/12   orif rt radial head  . SHOULDER SURGERY     Right rotator cuff epair  . TOTAL KNEE ARTHROPLASTY     Left  . VAGINAL PROLAPSE REPAIR N/A 08/28/2016   Procedure: ANTERIOR VAGINAL VAULT SUSPENSION WITH SACROSPINOUS FIXATION;  Surgeon: Carolan Clines, MD;  Location: [facility];  Service: Urology;  Laterality: N/A;    Family History  Problem Relation Age of Onset  . Pneumonia Mother   . Brain cancer Father   . Tremor Father   . Arthritis Sister   . COPD Sister   . Emphysema Brother   . COPD Brother   . Tremor Brother   . Lung cancer Brother   . Heart disease Brother   . Liver cancer Brother   . Diabetes Other         1 child has diabetes at age 54 and the other 4 are healthy    Current Outpatient Medications:  .  acetaminophen (TYLENOL) 650 MG CR tablet, Take 1,300 mg by  mouth every 8 (eight) hours as needed for pain., Disp: , Rfl:  .  Calcium Carb-Cholecalciferol 600-500 MG-UNIT CAPS, Take 1 capsule by mouth every morning., Disp: , Rfl:  .  celecoxib (CELEBREX) 100 MG capsule, Take 1 capsule by mouth daily., Disp: , Rfl:  .  cetirizine (ZYRTEC) 5 MG tablet, Take 1 tablet (5 mg total) by mouth daily., Disp: 90 tablet, Rfl: 0 .  Cholecalciferol 1000 UNITS tablet, Take 1,000 Units by mouth daily. , Disp: , Rfl:  .  CINNAMON PO, Take 1 tablet by mouth daily., Disp: , Rfl:  .  ciprofloxacin (CIPRO) 500 MG tablet, Take 1 tablet (500 mg total) by mouth 2 (two) times daily., Disp: 14 tablet, Rfl: 0 .  diltiazem (CARDIZEM CD) 180 MG 24 hr capsule, TAKE (1) CAPSULE DAILY, Disp: 90 capsule, Rfl: 1 .  ESTRACE VAGINAL 0.1 MG/GM vaginal cream, Place 1 Applicatorful vaginally once a week. , Disp: , Rfl:  .  fluticasone (FLONASE) 50 MCG/ACT nasal spray, Place 2 sprays into both nostrils daily as needed for allergies or rhinitis., Disp: 16 g, Rfl: 0 .  furosemide (LASIX) 20 MG tablet, Take 20 mg by mouth daily as needed for fluid or edema. , Disp: , Rfl:  .  lisinopril-hydrochlorothiazide (PRINZIDE,ZESTORETIC) 10-12.5 MG tablet, Take 2 tablets by mouth daily. For blood pressure, Disp: 180 tablet, Rfl: 3 .  metFORMIN (GLUCOPHAGE) 500 MG tablet, TAKE (1) TABLET TWICE A DAY., Disp: 60 tablet, Rfl: 0 .  Multiple Vitamins-Minerals (ICAPS AREDS 2 PO), Take 1 capsule by mouth daily. , Disp: , Rfl:  .  MYRBETRIQ 25 MG TB24 tablet, Take 25 mg by mouth daily. , Disp: , Rfl:  .  nitroGLYCERIN (NITROSTAT) 0.4 MG SL tablet, Place 1 tablet (0.4 mg total) under the tongue every 5 (five) minutes as needed for chest pain. (Patient not taking: Reported on 03/25/2017), Disp: 25 tablet, Rfl: 3 .  Omega-3 Fatty Acids (FISH OIL) 1000 MG CAPS, Take 1,000 mg by mouth at bedtime. , Disp: , Rfl:  .  omeprazole (PRILOSEC) 40 MG capsule, Take 1 capsule (40 mg total) by mouth 2 (two) times daily., Disp: 60  capsule, Rfl: 4 .  ONE TOUCH ULTRA TEST test strip, CHECK BLOOD SUGAR ONCE A DAY, Disp: 50 each, Rfl: 5 .  potassium chloride (K-DUR) 10 MEQ tablet, TAKE (1) TABLET DAILY AS DIRECTED., Disp: 30 tablet, Rfl: 6 .  potassium  chloride (K-DUR) 10 MEQ tablet, TAKE (1) TABLET DAILY AS DIRECTED., Disp: 30 tablet, Rfl: 0 .  pravastatin (PRAVACHOL) 40 MG tablet, TAKE 1 TABLET DAILY, Disp: 30 tablet, Rfl: 4 .  rosuvastatin (CRESTOR) 5 MG tablet, Take 1 tablet (5 mg total) by mouth at bedtime., Disp: 30 tablet, Rfl: 1  Social Hx: non smoker. ROS: Per HPI  Objective: Office vital signs reviewed. BP (!) 167/81   Pulse 85   Temp (!) 97.1 F (36.2 C) (Oral)   Ht 5\' 3"  (1.6 m)   Wt 170 lb (77.1 kg)   BMI 30.11 kg/m   Physical Examination:  General: Awake, alert, elderly female, No acute distress Cardio: regular rate and rhythm, S1S2 heard, no murmurs appreciated Pulm: clear to auscultation bilaterally, no wheezes, rhonchi or rales; normal work of breathing on room air MSK: Slow gait.  C-spine: She has reduced range of motion in flexion and extension of the C-spine.  She also has about a 15 degree loss of active range of motion in both rotation to the left and right.  No midline tenderness to palpation to the C-spine.  She does have slight increased muscle tone on the left paraspinal muscles.  These are tender as well.  Upper extremities: She has about a 90 degree loss of active range of motion in abduction and flexion of the upper extremities.  She is unable to fully internally rotate bilateral shoulders.  No tenderness to palpation to the actual shoulder itself but she does have tenderness to palpation along the trapezius on the left side.  Upper extremity strength 3/5 bilaterally.  Lumbar spine: No midline tenderness to palpation.  She does have flattening of the lumbar curve.  She has tenderness to palpation particularly along the left side of the lumbosacral area.  No increased muscle tonicity  appreciated here.  Active range of motion is limited by pain. Neuro: Light touch sensation grossly intact to the upper and lower extremities.  Assessment/ Plan: 83 y.o. female   1. Osteoarthritis of spine with radiculopathy, lumbar region I did recommend that patient consider discontinuing Celebrex if this is not been helpful to her.  I recommended that she increase her Tylenol arthritis to 1 tablet every 8 hours.  Continue muscle rubs as needed.  Continue heat as needed.  I did discuss consideration for referral to physical therapy for possible non-medicinal/nonsurgical interventions.  Patient does seem interested in this but would like to wait until after the first of the year.  No red flags on exam.  Dose of Depo-Medrol was administered IM today efforts to improve pain for patient.  Continue follow-up with PCP as needed. - methylPREDNISolone acetate (DEPO-MEDROL) injection 40 mg  2. Neck pain Likely has C-spine arthritis.  Left-sided trapezius muscle spasm was noted on today's exam.  I recommended that she increase her Tylenol to 1 tablet every 8 hours.  Continue muscle rubs as needed.  I recommended that she use her baclofen at nighttime and when she is not driving as directed.  I did caution sedation.  Home care instructions were reviewed with the patient.  Home stretches were reviewed and a handout was provided.  She will follow-up with her PCP for this. - methylPREDNISolone acetate (DEPO-MEDROL) injection 40 mg  3. Muscle spasms of neck See above  4. Elevated blood pressure reading No red flags.  Patient has an appointment with her PCP on 12/ 5.  Elevation may be secondary to current pain state.  No medication adjustments were made today.  Debra Norlander, DO [facility] [phone]

## 2017-07-20 NOTE — Patient Instructions (Signed)
As we discussed, I would like you to take one Tylenol arthritis every 8 hours.  This is a safe dose.  This will help control your pain and does not cause sleepiness.  I think that the neck pain you are experiencing is muscle pain.  You may be having a spasm because of your known arthritis in your back.  Tylenol can help some with the pain but usually does not reduce the spasm.  You have baclofen, which is indicated for muscle spasm.  This medication does cause sleepiness.  Do not drive when taking this medicine.  I recommend that you consider taking it at bedtime to see if you can improve your symptoms.  We discussed referring you to a physical therapist for improving her range of motion and reducing your back pain.  You asked that we wait on this until January.  Please discuss this again with Dr. Laurance Flatten when you see him.  If your Celebrex is not helpful for your pain symptoms, I do recommend discontinuing this.  Continue using heat and any topical pain reliever that you would like to reduce your symptoms.   Cervical Strain and Sprain Rehab Ask your health care provider which exercises are safe for you. Do exercises exactly as told by your health care provider and adjust them as directed. It is normal to feel mild stretching, pulling, tightness, or discomfort as you do these exercises, but you should stop right away if you feel sudden pain or your pain gets worse.Do not begin these exercises until told by your health care provider. Stretching and range of motion exercises These exercises warm up your muscles and joints and improve the movement and flexibility of your neck. These exercises also help to relieve pain, numbness, and tingling. Exercise A: Cervical side bend  1. Using good posture, sit on a stable chair or stand up. 2. Without moving your shoulders, slowly tilt your left / right ear to your shoulder until you feel a stretch in your neck muscles. You should be looking straight  ahead. 3. Hold for ____30______ seconds. 4. Repeat with the other side of your neck. Repeat ____3_____ times. Complete this exercise ___3_______ times a day. Exercise B: Cervical rotation  1. Using good posture, sit on a stable chair or stand up. 2. Slowly turn your head to the side as if you are looking over your left / right shoulder. ? Keep your eyes level with the ground. ? Stop when you feel a stretch along the side and the back of your neck. 3. Hold for ______30____ seconds. 4. Repeat this by turning to your other side. Repeat ______3____ times. Complete this exercise ___3_______ times a day. Exercise C: Thoracic extension and pectoral stretch 1. Roll a towel or a small blanket so it is about 4 inches (10 cm) in diameter. 2. Lie down on your back on a firm surface. 3. Put the towel lengthwise, under your spine in the middle of your back. It should not be not under your shoulder blades. The towel should line up with your spine from your middle back to your lower back. 4. Put your hands behind your head and let your elbows fall out to your sides. 5. Hold for ____30______ seconds. Repeat _____3_____ times. Complete this exercise ___3_______ times a day. Strengthening exercises These exercises build strength and endurance in your neck. Endurance is the ability to use your muscles for a long time, even after your muscles get tired. Exercise D: Upper cervical flexion, isometric 1.  Lie on your back with a thin pillow behind your head and a small rolled-up towel under your neck. 2. Gently tuck your chin toward your chest and nod your head down to look toward your feet. Do not lift your head off the pillow. 3. Hold for __30________ seconds. 4. Release the tension slowly. Relax your neck muscles completely before you repeat this exercise. Repeat _____3_____ times. Complete this exercise ______3____ times a day.  Posture and body mechanics  Body mechanics refers to the movements and  positions of your body while you do your daily activities. Posture is part of body mechanics. Good posture and healthy body mechanics can help to relieve stress in your body's tissues and joints. Good posture means that your spine is in its natural S-curve position (your spine is neutral), your shoulders are pulled back slightly, and your head is not tipped forward. The following are general guidelines for applying improved posture and body mechanics to your everyday activities. Standing  When standing, keep your spine neutral and keep your feet about hip-width apart. Keep a slight bend in your knees. Your ears, shoulders, and hips should line up.  When you do a task in which you stand in one place for a long time, place one foot up on a stable object that is 2-4 inches (5-10 cm) high, such as a footstool. This helps keep your spine neutral. Sitting   When sitting, keep your spine neutral and your keep feet flat on the floor. Use a footrest, if necessary, and keep your thighs parallel to the floor. Avoid rounding your shoulders, and avoid tilting your head forward.  When working at a desk or a computer, keep your desk at a height where your hands are slightly lower than your elbows. Slide your chair under your desk so you are close enough to maintain good posture.  When working at a computer, place your monitor at a height where you are looking straight ahead and you do not have to tilt your head forward or downward to look at the screen. Resting When lying down and resting, avoid positions that are most painful for you. Try to support your neck in a neutral position. You can use a contour pillow or a small rolled-up towel. Your pillow should support your neck but not push on it. This information is not intended to replace advice given to you by your health care provider. Make sure you discuss any questions you have with your health care provider. Document Released: 08/11/2005 Document Revised:  04/17/2016 Document Reviewed: 07/18/2015 Elsevier Interactive Patient Education  [employer].

## 2017-07-29 ENCOUNTER — Ambulatory Visit (INDEPENDENT_AMBULATORY_CARE_PROVIDER_SITE_OTHER): Payer: Medicare Other | Admitting: Family Medicine

## 2017-07-29 ENCOUNTER — Ambulatory Visit (INDEPENDENT_AMBULATORY_CARE_PROVIDER_SITE_OTHER): Payer: Medicare Other

## 2017-07-29 ENCOUNTER — Encounter: Payer: Self-pay | Admitting: Family Medicine

## 2017-07-29 VITALS — BP 147/84 | HR 77 | Temp 97.0°F | Ht 63.0 in | Wt 166.0 lb

## 2017-07-29 DIAGNOSIS — G8929 Other chronic pain: Secondary | ICD-10-CM

## 2017-07-29 DIAGNOSIS — E119 Type 2 diabetes mellitus without complications: Secondary | ICD-10-CM

## 2017-07-29 DIAGNOSIS — K219 Gastro-esophageal reflux disease without esophagitis: Secondary | ICD-10-CM | POA: Diagnosis not present

## 2017-07-29 DIAGNOSIS — M545 Low back pain: Secondary | ICD-10-CM

## 2017-07-29 DIAGNOSIS — M542 Cervicalgia: Secondary | ICD-10-CM | POA: Diagnosis not present

## 2017-07-29 DIAGNOSIS — E559 Vitamin D deficiency, unspecified: Secondary | ICD-10-CM | POA: Diagnosis not present

## 2017-07-29 DIAGNOSIS — I10 Essential (primary) hypertension: Secondary | ICD-10-CM

## 2017-07-29 DIAGNOSIS — R300 Dysuria: Secondary | ICD-10-CM | POA: Diagnosis not present

## 2017-07-29 LAB — URINALYSIS, COMPLETE
BILIRUBIN UA: NEGATIVE
GLUCOSE, UA: NEGATIVE
Ketones, UA: NEGATIVE
Leukocytes, UA: NEGATIVE
Nitrite, UA: NEGATIVE
PROTEIN UA: NEGATIVE
SPEC GRAV UA: 1.015 (ref 1.005–1.030)
UUROB: 0.2 mg/dL (ref 0.2–1.0)
pH, UA: 7 (ref 5.0–7.5)

## 2017-07-29 LAB — MICROSCOPIC EXAMINATION
Epithelial Cells (non renal): >10 /hpf — AB (ref 0–10)
Renal Epithel, UA: NONE SEEN /hpf

## 2017-07-29 LAB — BAYER DCA HB A1C WAIVED: HB A1C: 7 % — AB (ref <7.0)

## 2017-07-29 NOTE — Patient Instructions (Addendum)
Medicare Annual Wellness Visit  [facility] and the medical providers at [facility] strive to bring you the best medical care.  In doing so we not only want to address your current medical conditions and concerns but also to detect new conditions early and prevent illness, disease and health-related problems.    Medicare offers a yearly Wellness Visit which allows our clinical staff to assess your need for preventative services including immunizations, lifestyle education, counseling to decrease risk of preventable diseases and screening for fall risk and other medical concerns.    This visit is provided free of charge (no copay) for all Medicare recipients. The clinical pharmacists at [facility] have begun to conduct these Wellness Visits which will also include a thorough review of all your medications.    As you primary medical provider recommend that you make an appointment for your Annual Wellness Visit if you have not done so already this year.  You may set up this appointment before you leave today or you may call back ([phone]) and schedule an appointment.  Please make sure when you call that you mention that you are scheduling your Annual Wellness Visit with the clinical pharmacist so that the appointment may be made for the proper length of time.     Continue current medications. Continue good therapeutic lifestyle changes which include good diet and exercise. Fall precautions discussed with patient. If an FOBT was given today- please return it to our front desk. If you are over 47 years old - you may need Prevnar 33 or the adult Pneumonia vaccine.  **Flu shots are available--- please call and schedule a FLU-CLINIC appointment**  After your visit with [place] today you will receive a survey in the mail or online from [employer] regarding your care with [place]. Please take a moment to fill this out. Your feedback is very  important to [place] as you can help [place] better understand your patient needs as well as improve your experience and satisfaction. WE CARE ABOUT YOU!!!   We will call week you with the results of the urinalysis as soon as those results become available We will also call with the lumbar spine x-ray films and would ask you to come by and pick up a copy of those to take with you to your visit to see Dr. Carloyn Manner along with the reports from the cervical spine films and bilateral hip films that you have already had done. Use common sense and do not climb.  Let someone else help you do this.  Another fall could result in a hip fracture and a nursing home placement.  Please remember this! Continue to watch her diet closely and drink plenty of water and fluids

## 2017-07-29 NOTE — Addendum Note (Signed)
Addended by: Zannie Cove on: 07/29/2017 03:12 PM   Modules accepted: Orders

## 2017-07-29 NOTE — Progress Notes (Signed)
Subjective:    Patient ID: Debra Phillips, female    DOB: [birth year], 83 y.o.   MRN: [id number]  HPI Pt here for follow up and management of chronic medical problems which includes diabetes and hypertension. She is taking medication regularly.  The patient today complains of frequency and burning and we will check a urinalysis.  She does continue to complain with headache and muscle spasms in her back and fatigue.  She is taking Myrbetriq for the frequency.  She is due to return in FOBT and get lab work today.  She does have an appointment to see the neurosurgeon, Dr. Carloyn Manner soon.  The patient has a history of diabetes hyperlipidemia hypertension osteoporosis and chronic ongoing back pain along with arthritis.  The patient's husband died several years ago when she spent many years looking after him and this certainly was not good for all of her arthritic and back problems.  Her med list has both pravastatin and Crestor listed and will make sure she is only taking 1 of the other and not both.  The patient denies any chest pain or shortness of breath anymore than usual.  She denies any heartburn or indigestion as long as she takes her omeprazole.  She denies any blood in the stool or black tarry bowel movements.  She does have frequency and slight burning and foul-smelling urine.  She says that her blood sugars at home have been running in the 150-60 range fasting and sometimes as high as 200 during the day especially if she is having a lot of pain in her back and neck.  She has mild degenerative disc disease at C6 and 7 with moderate multilevel neural foraminal encroachment bilaterally due to facet and joint hypertrophy.  She also has osteoarthritis of both hips and SI joints.  I did not see any recent LS spine films.      Patient Active Problem List   Diagnosis Date Noted  . Dysuria 01/22/2017  . Pelvic prolapse 08/28/2016  . Tremor 01/29/2016  . Abnormality of gait 01/29/2016  . Type 2 diabetes  mellitus with hyperlipidemia ([place]) 01/14/2016  . Age-related macular degeneration 07/13/2015  . Keratoconjunctivitis sicca of both eyes ([place]) 07/13/2015  . Meibomian gland dysfunction (MGD), bilateral, both upper and lower lids 07/13/2015  . Pseudophakia of both eyes 07/13/2015  . Osteoarthritis of lumbar spine 05/08/2015  . Spondylosis of lumbar region without myelopathy or radiculopathy 09/01/2014  . Diabetes type 2, controlled ([place]) 04/26/2014  . Vitamin D deficiency 04/26/2014  . Chronic cystitis 04/26/2014  . Atrophic vaginitis 04/26/2014  . Solitary pulmonary nodule 03/20/2014  . Chest pain 03/18/2014  . Osteoporotic compression fracture of spine with routine healing 03/08/2014  . PALPITATIONS 02/27/2010  . Coronary atherosclerosis 12/12/2009  . Aortic aneurysm of unspecified site without mention of rupture 06/27/2009  . DYSPNEA 06/27/2009  . DYSLIPIDEMIA 12/12/2008  . Essential hypertension 12/12/2008  . GERD 12/12/2008  . OSTEOARTHRITIS 12/12/2008   Outpatient Encounter Medications as of 07/29/2017  Medication Sig  . acetaminophen (TYLENOL) 650 MG CR tablet Take 1,300 mg by mouth every 8 (eight) hours as needed for pain.  Marland Kitchen aspirin EC 81 MG tablet Take 81 mg by mouth daily.  . baclofen (LIORESAL) 10 MG tablet Take 5 mg by mouth 2 (two) times daily.  . Calcium Carb-Cholecalciferol 600-500 MG-UNIT CAPS Take 1 capsule by mouth every morning.  . celecoxib (CELEBREX) 100 MG capsule Take 1 capsule by mouth daily.  . cetirizine (ZYRTEC) 5 MG  tablet Take 1 tablet (5 mg total) by mouth daily.  . Cholecalciferol 1000 UNITS tablet Take 1,000 Units by mouth daily.   Marland Kitchen CINNAMON PO Take 1 tablet by mouth daily.  Marland Kitchen diltiazem (CARDIZEM CD) 180 MG 24 hr capsule TAKE (1) CAPSULE DAILY  . ESTRACE VAGINAL 0.1 MG/GM vaginal cream Place 1 Applicatorful vaginally once a week.   . fluticasone (FLONASE) 50 MCG/ACT nasal spray Place 2 sprays into both nostrils daily as needed for allergies or  rhinitis.  . furosemide (LASIX) 20 MG tablet Take 20 mg by mouth daily as needed for fluid or edema.   Marland Kitchen lisinopril-hydrochlorothiazide (PRINZIDE,ZESTORETIC) 10-12.5 MG tablet Take 2 tablets by mouth daily. For blood pressure  . metFORMIN (GLUCOPHAGE) 500 MG tablet TAKE (1) TABLET TWICE A DAY.  . Multiple Vitamins-Minerals (ICAPS AREDS 2 PO) Take 1 capsule by mouth daily.   Marland Kitchen MYRBETRIQ 25 MG TB24 tablet Take 25 mg by mouth daily.   . Omega-3 Fatty Acids (FISH OIL) 1000 MG CAPS Take 1,000 mg by mouth at bedtime.   Marland Kitchen omeprazole (PRILOSEC) 40 MG capsule Take 1 capsule (40 mg total) by mouth 2 (two) times daily.  . ONE TOUCH ULTRA TEST test strip CHECK BLOOD SUGAR ONCE A DAY  . potassium chloride (K-DUR) 10 MEQ tablet TAKE (1) TABLET DAILY AS DIRECTED.  Marland Kitchen pravastatin (PRAVACHOL) 40 MG tablet TAKE 1 TABLET DAILY  . rosuvastatin (CRESTOR) 5 MG tablet Take 1 tablet (5 mg total) by mouth at bedtime.  . traMADol (ULTRAM) 50 MG tablet Take 50 mg by mouth every 6 (six) hours as needed.  . nitroGLYCERIN (NITROSTAT) 0.4 MG SL tablet Place 1 tablet (0.4 mg total) under the tongue every 5 (five) minutes as needed for chest pain. (Patient not taking: Reported on 07/29/2017)   No facility-administered encounter medications on file as of 07/29/2017.      Review of Systems  Constitutional: Positive for fatigue.  HENT: Negative.   Eyes: Negative.   Respiratory: Negative.   Cardiovascular: Negative.   Gastrointestinal: Negative.   Endocrine: Negative.   Genitourinary: Negative.   Musculoskeletal: Positive for back pain.  Skin: Negative.   Allergic/Immunologic: Negative.   Neurological: Positive for headaches.  Hematological: Negative.   Psychiatric/Behavioral: Negative.        Objective:   Physical Exam  Constitutional: She is oriented to person, place, and time. She appears well-developed and well-nourished. No distress.  The patient is alert and spry and laughing despite all of her neck and back  issues.  HENT:  Head: Normocephalic and atraumatic.  Right Ear: External ear normal.  Left Ear: External ear normal.  Mouth/Throat: Oropharynx is clear and moist. No oropharyngeal exudate.  Slight nasal congestion bilaterally  Eyes: Conjunctivae and EOM are normal. Pupils are equal, round, and reactive to light. Right eye exhibits no discharge. Left eye exhibits no discharge. No scleral icterus.  Neck: Normal range of motion. Neck supple. No thyromegaly present.  No bruits thyromegaly or anterior cervical adenopathy with fairly good mobility.  Cardiovascular: Normal rate, regular rhythm, normal heart sounds and intact distal pulses.  No murmur heard. Heart is regular at 84/min  Pulmonary/Chest: Effort normal and breath sounds normal. No respiratory distress. She has no wheezes. She has no rales.  Clear anteriorly and posteriorly  Abdominal: Soft. Bowel sounds are normal. She exhibits no mass. There is tenderness. There is no rebound and no guarding.  No organ enlargement but suprapubic and lower abdominal tenderness no masses and no bruits.  Musculoskeletal:  She exhibits tenderness. She exhibits no edema.  The patient uses a cane for gait stability and has a thoracic kyphosis.  She has pain with leg raising in her low back be on 30 degrees on both sides.  There is also pain in the left sacroiliac area with palpation.  Lymphadenopathy:    She has no cervical adenopathy.  Neurological: She is alert and oriented to person, place, and time. She has normal reflexes. No cranial nerve deficit.  Skin: Skin is warm and dry. No rash noted.  Psychiatric: She has a normal mood and affect. Her behavior is normal. Judgment and thought content normal.  Nursing note and vitals reviewed.  BP (!) 147/84 (BP Location: Left Arm)   Pulse 77   Temp (!) 97 F (36.1 C) (Oral)   Ht '5\' 3"'  (1.6 m)   Wt 166 lb (75.3 kg)   BMI 29.41 kg/m         Assessment & Plan:  1. Controlled type 2 diabetes mellitus  without complication, without long-term current use of insulin ([place]) The patient says that her blood sugars have been running in the 150-200 range at home.  She will continue with her current treatment pending results of lab work - BMP8+EGFR - CBC with Differential/Platelet - Bayer [place] Hb A1c Waived  2. Essential hypertension -The systolic blood pressure is slightly elevated but no change in treatment she will continue to watch her sodium intake as much as possible - BMP8+EGFR - CBC with Differential/Platelet - Lipid panel - Hepatic function panel  3. Vitamin D deficiency -Continue current treatment pending results of lab work - CBC with Differential/Platelet - VITAMIN D 25 Hydroxy (Vit-D Deficiency, Fractures)  4. Gastroesophageal reflux disease, esophagitis presence not specified -Continue with omeprazole as this has helped relieve her reflux symptoms and heartburn - CBC with Differential/Platelet - Hepatic function panel  5. Dysuria -Check urinalysis in the meantime she should continue with plenty of fluids and stay well-hydrated - CBC with Differential/Platelet - Urinalysis, Complete - Urine Culture  6. Chronic low back pain, unspecified back pain laterality, with sciatica presence unspecified -Follow-up with neurosurgery as planned - DG Lumbar Spine 2-3 Views; Future  7. Neck pain -Follow-up with neurosurgery as planned  No orders of the defined types were placed in this encounter.  Patient Instructions                       Medicare Annual Wellness Visit  [facility] and the medical providers at [facility] strive to bring you the best medical care.  In doing so we not only want to address your current medical conditions and concerns but also to detect new conditions early and prevent illness, disease and health-related problems.    Medicare offers a yearly Wellness Visit which allows our clinical staff to assess your need for preventative  services including immunizations, lifestyle education, counseling to decrease risk of preventable diseases and screening for fall risk and other medical concerns.    This visit is provided free of charge (no copay) for all Medicare recipients. The clinical pharmacists at [facility] have begun to conduct these Wellness Visits which will also include a thorough review of all your medications.    As you primary medical provider recommend that you make an appointment for your Annual Wellness Visit if you have not done so already this year.  You may set up this appointment before you leave today or you may  call back ([phone]) and schedule an appointment.  Please make sure when you call that you mention that you are scheduling your Annual Wellness Visit with the clinical pharmacist so that the appointment may be made for the proper length of time.     Continue current medications. Continue good therapeutic lifestyle changes which include good diet and exercise. Fall precautions discussed with patient. If an FOBT was given today- please return it to our front desk. If you are over 34 years old - you may need Prevnar 15 or the adult Pneumonia vaccine.  **Flu shots are available--- please call and schedule a FLU-CLINIC appointment**  After your visit with [place] today you will receive a survey in the mail or online from [employer] regarding your care with [place]. Please take a moment to fill this out. Your feedback is very important to [place] as you can help [place] better understand your patient needs as well as improve your experience and satisfaction. WE CARE ABOUT YOU!!!   We will call week you with the results of the urinalysis as soon as those results become available We will also call with the lumbar spine x-ray films and would ask you to come by and pick up a copy of those to take with you to your visit to see Dr. Carloyn Manner along with the reports from the cervical spine films and bilateral hip  films that you have already had done. Use common sense and do not climb.  Let someone else help you do this.  Another fall could result in a hip fracture and a nursing home placement.  Please remember this! Continue to watch her diet closely and drink plenty of water and fluids    Arrie Senate MD

## 2017-07-30 LAB — HEPATIC FUNCTION PANEL
ALT: 16 IU/L (ref 0–32)
AST: 23 IU/L (ref 0–40)
Albumin: 4.7 g/dL (ref 3.5–4.7)
Alkaline Phosphatase: 61 IU/L (ref 39–117)
BILIRUBIN TOTAL: 0.6 mg/dL (ref 0.0–1.2)
Bilirubin, Direct: 0.13 mg/dL (ref 0.00–0.40)
Total Protein: 6.8 g/dL (ref 6.0–8.5)

## 2017-07-30 LAB — BMP8+EGFR
BUN / CREAT RATIO: 14 (ref 12–28)
BUN: 11 mg/dL (ref 8–27)
CHLORIDE: 95 mmol/L — AB (ref 96–106)
CO2: 24 mmol/L (ref 20–29)
Calcium: 9.5 mg/dL (ref 8.7–10.3)
Creatinine, Ser: 0.76 mg/dL (ref 0.57–1.00)
GFR, EST AFRICAN AMERICAN: 82 mL/min/{1.73_m2} (ref >59)
GFR, EST NON AFRICAN AMERICAN: 71 mL/min/{1.73_m2} (ref >59)
Glucose: 161 mg/dL — ABNORMAL HIGH (ref 65–99)
POTASSIUM: 4.4 mmol/L (ref 3.5–5.2)
SODIUM: 135 mmol/L (ref 134–144)

## 2017-07-30 LAB — LIPID PANEL
CHOL/HDL RATIO: 2.9 ratio (ref 0.0–4.4)
Cholesterol, Total: 163 mg/dL (ref 100–199)
HDL: 57 mg/dL (ref >39)
LDL Calculated: 75 mg/dL (ref 0–99)
Triglycerides: 157 mg/dL — ABNORMAL HIGH (ref 0–149)
VLDL CHOLESTEROL CAL: 31 mg/dL (ref 5–40)

## 2017-07-30 LAB — CBC WITH DIFFERENTIAL/PLATELET
BASOS ABS: 0 10*3/uL (ref 0.0–0.2)
BASOS: 0 %
EOS (ABSOLUTE): 0.1 10*3/uL (ref 0.0–0.4)
Eos: 1 %
HEMATOCRIT: 38.3 % (ref 34.0–46.6)
HEMOGLOBIN: 12.9 g/dL (ref 11.1–15.9)
IMMATURE GRANS (ABS): 0 10*3/uL (ref 0.0–0.1)
Immature Granulocytes: 0 %
LYMPHS ABS: 2.8 10*3/uL (ref 0.7–3.1)
LYMPHS: 34 %
MCH: 30.4 pg (ref 26.6–33.0)
MCHC: 33.7 g/dL (ref 31.5–35.7)
MCV: 90 fL (ref 79–97)
Monocytes Absolute: 0.7 10*3/uL (ref 0.1–0.9)
Monocytes: 8 %
NEUTROS ABS: 4.6 10*3/uL (ref 1.4–7.0)
Neutrophils: 57 %
Platelets: 342 10*3/uL (ref 150–379)
RBC: 4.24 x10E6/uL (ref 3.77–5.28)
RDW: 13.8 % (ref 12.3–15.4)
WBC: 8.2 10*3/uL (ref 3.4–10.8)

## 2017-07-30 LAB — VITAMIN D 25 HYDROXY (VIT D DEFICIENCY, FRACTURES): VIT D 25 HYDROXY: 51.1 ng/mL (ref 30.0–100.0)

## 2017-07-31 ENCOUNTER — Telehealth: Payer: Self-pay | Admitting: Family Medicine

## 2017-08-01 LAB — URINE CULTURE

## 2017-08-04 ENCOUNTER — Other Ambulatory Visit: Payer: Self-pay

## 2017-08-04 DIAGNOSIS — R300 Dysuria: Secondary | ICD-10-CM

## 2017-08-04 DIAGNOSIS — N8111 Cystocele, midline: Secondary | ICD-10-CM

## 2017-08-04 DIAGNOSIS — N3020 Other chronic cystitis without hematuria: Secondary | ICD-10-CM

## 2017-08-04 DIAGNOSIS — Z87440 Personal history of urinary (tract) infections: Secondary | ICD-10-CM

## 2017-08-04 MED ORDER — MYRBETRIQ 25 MG PO TB24: 25.0000 mg | ORAL_TABLET | Freq: Every day | ORAL | 2 refills | Status: DC

## 2017-08-04 MED ORDER — CEFDINIR 300 MG PO CAPS
300.0000 mg | ORAL_CAPSULE | Freq: Two times a day (BID) | ORAL | 0 refills | Status: DC
Start: 2017-08-04 — End: 2017-09-03

## 2017-08-10 ENCOUNTER — Other Ambulatory Visit: Payer: Self-pay | Admitting: Family Medicine

## 2017-08-12 DIAGNOSIS — M47816 Spondylosis without myelopathy or radiculopathy, lumbar region: Secondary | ICD-10-CM | POA: Diagnosis not present

## 2017-08-12 DIAGNOSIS — M461 Sacroiliitis, not elsewhere classified: Secondary | ICD-10-CM | POA: Diagnosis not present

## 2017-08-24 ENCOUNTER — Other Ambulatory Visit: Payer: Self-pay | Admitting: Family Medicine

## 2017-08-24 DIAGNOSIS — R05 Cough: Secondary | ICD-10-CM

## 2017-08-24 DIAGNOSIS — R059 Cough, unspecified: Secondary | ICD-10-CM

## 2017-08-24 DIAGNOSIS — J301 Allergic rhinitis due to pollen: Secondary | ICD-10-CM

## 2017-08-27 ENCOUNTER — Other Ambulatory Visit: Payer: Self-pay | Admitting: Neurosurgery

## 2017-08-27 DIAGNOSIS — M47816 Spondylosis without myelopathy or radiculopathy, lumbar region: Secondary | ICD-10-CM

## 2017-08-31 ENCOUNTER — Other Ambulatory Visit: Payer: Medicare Other

## 2017-08-31 DIAGNOSIS — Z87440 Personal history of urinary (tract) infections: Secondary | ICD-10-CM

## 2017-08-31 DIAGNOSIS — Z1212 Encounter for screening for malignant neoplasm of rectum: Secondary | ICD-10-CM

## 2017-08-31 DIAGNOSIS — N3020 Other chronic cystitis without hematuria: Secondary | ICD-10-CM

## 2017-08-31 DIAGNOSIS — N8111 Cystocele, midline: Secondary | ICD-10-CM | POA: Diagnosis not present

## 2017-08-31 LAB — URINALYSIS, COMPLETE
Bilirubin, UA: NEGATIVE
Glucose, UA: NEGATIVE
KETONES UA: NEGATIVE
Leukocytes, UA: NEGATIVE
NITRITE UA: NEGATIVE
PH UA: 7 (ref 5.0–7.5)
Protein, UA: NEGATIVE
RBC UA: NEGATIVE
SPEC GRAV UA: 1.01 (ref 1.005–1.030)
Urobilinogen, Ur: 0.2 mg/dL (ref 0.2–1.0)

## 2017-08-31 LAB — MICROSCOPIC EXAMINATION
RBC, UA: NONE SEEN /hpf (ref 0–?)
Renal Epithel, UA: NONE SEEN /hpf

## 2017-09-02 LAB — URINE CULTURE

## 2017-09-02 LAB — FECAL OCCULT BLOOD, IMMUNOCHEMICAL: FECAL OCCULT BLD: NEGATIVE

## 2017-09-03 ENCOUNTER — Other Ambulatory Visit: Payer: Self-pay | Admitting: *Deleted

## 2017-09-03 DIAGNOSIS — N390 Urinary tract infection, site not specified: Secondary | ICD-10-CM

## 2017-09-03 MED ORDER — CEFDINIR 300 MG PO CAPS: 300.0000 mg | ORAL_CAPSULE | Freq: Two times a day (BID) | ORAL | 0 refills | Status: DC

## 2017-09-08 ENCOUNTER — Other Ambulatory Visit: Payer: Self-pay | Admitting: Family Medicine

## 2017-09-08 ENCOUNTER — Ambulatory Visit
Admission: RE | Admit: 2017-09-08 | Discharge: 2017-09-08 | Disposition: A | Discharge status: Home / Self Care | Payer: Medicare Other | Source: Ambulatory Visit | Attending: Neurosurgery | Admitting: Neurosurgery

## 2017-09-08 DIAGNOSIS — M533 Sacrococcygeal disorders, not elsewhere classified: Secondary | ICD-10-CM | POA: Diagnosis not present

## 2017-09-08 DIAGNOSIS — M47816 Spondylosis without myelopathy or radiculopathy, lumbar region: Secondary | ICD-10-CM

## 2017-09-08 MED ORDER — IOPAMIDOL (ISOVUE-M 200) INJECTION 41%
1.0000 mL | Freq: Once | INTRAMUSCULAR | Status: AC
  Administered 2017-09-08: 1 mL via INTRA_ARTICULAR

## 2017-09-08 MED ORDER — METHYLPREDNISOLONE ACETATE 40 MG/ML INJ SUSP (RADIOLOG
120.0000 mg | Freq: Once | INTRAMUSCULAR | Status: AC
  Administered 2017-09-08: 120 mg via INTRA_ARTICULAR

## 2017-09-08 NOTE — Discharge Instructions (Signed)

## 2017-09-18 ENCOUNTER — Other Ambulatory Visit: Payer: Self-pay | Admitting: Family Medicine

## 2017-09-18 MED ORDER — FLUCONAZOLE 150 MG PO TABS: 150.0000 mg | ORAL_TABLET | Freq: Once | ORAL | 0 refills | Status: AC

## 2017-09-18 NOTE — Telephone Encounter (Signed)
Diflucan 150 #3 1 daily for 3 days

## 2017-09-18 NOTE — Telephone Encounter (Signed)
Diflucan 150 mg 1 daily for 3 days.

## 2017-09-18 NOTE — Telephone Encounter (Signed)
Rx sent- patient aware.  

## 2017-10-07 DIAGNOSIS — R3914 Feeling of incomplete bladder emptying: Secondary | ICD-10-CM | POA: Diagnosis not present

## 2017-10-07 DIAGNOSIS — N3020 Other chronic cystitis without hematuria: Secondary | ICD-10-CM | POA: Diagnosis not present

## 2017-10-07 DIAGNOSIS — R3982 Chronic bladder pain: Secondary | ICD-10-CM | POA: Diagnosis not present

## 2017-10-07 DIAGNOSIS — N3941 Urge incontinence: Secondary | ICD-10-CM | POA: Diagnosis not present

## 2017-10-07 DIAGNOSIS — N816 Rectocele: Secondary | ICD-10-CM | POA: Diagnosis not present

## 2017-10-07 DIAGNOSIS — N952 Postmenopausal atrophic vaginitis: Secondary | ICD-10-CM | POA: Diagnosis not present

## 2017-10-23 DIAGNOSIS — R3914 Feeling of incomplete bladder emptying: Secondary | ICD-10-CM | POA: Diagnosis not present

## 2017-10-23 DIAGNOSIS — R3982 Chronic bladder pain: Secondary | ICD-10-CM | POA: Diagnosis not present

## 2017-10-23 DIAGNOSIS — M62838 Other muscle spasm: Secondary | ICD-10-CM | POA: Diagnosis not present

## 2017-10-23 DIAGNOSIS — M6281 Muscle weakness (generalized): Secondary | ICD-10-CM | POA: Diagnosis not present

## 2017-10-30 DIAGNOSIS — M6281 Muscle weakness (generalized): Secondary | ICD-10-CM | POA: Diagnosis not present

## 2017-10-30 DIAGNOSIS — R3982 Chronic bladder pain: Secondary | ICD-10-CM | POA: Diagnosis not present

## 2017-10-30 DIAGNOSIS — M62838 Other muscle spasm: Secondary | ICD-10-CM | POA: Diagnosis not present

## 2017-10-30 DIAGNOSIS — R3914 Feeling of incomplete bladder emptying: Secondary | ICD-10-CM | POA: Diagnosis not present

## 2017-11-09 ENCOUNTER — Other Ambulatory Visit: Payer: Self-pay | Admitting: Family Medicine

## 2017-11-10 DIAGNOSIS — M6289 Other specified disorders of muscle: Secondary | ICD-10-CM | POA: Diagnosis not present

## 2017-11-10 DIAGNOSIS — N3946 Mixed incontinence: Secondary | ICD-10-CM | POA: Diagnosis not present

## 2017-11-10 DIAGNOSIS — R350 Frequency of micturition: Secondary | ICD-10-CM | POA: Diagnosis not present

## 2017-11-10 DIAGNOSIS — M62838 Other muscle spasm: Secondary | ICD-10-CM | POA: Diagnosis not present

## 2017-11-10 DIAGNOSIS — M6281 Muscle weakness (generalized): Secondary | ICD-10-CM | POA: Diagnosis not present

## 2017-11-10 DIAGNOSIS — R3982 Chronic bladder pain: Secondary | ICD-10-CM | POA: Diagnosis not present

## 2017-11-10 DIAGNOSIS — R3914 Feeling of incomplete bladder emptying: Secondary | ICD-10-CM | POA: Diagnosis not present

## 2017-11-10 DIAGNOSIS — N3941 Urge incontinence: Secondary | ICD-10-CM | POA: Diagnosis not present

## 2017-11-10 DIAGNOSIS — N3020 Other chronic cystitis without hematuria: Secondary | ICD-10-CM | POA: Diagnosis not present

## 2017-11-10 DIAGNOSIS — R351 Nocturia: Secondary | ICD-10-CM | POA: Diagnosis not present

## 2017-11-11 ENCOUNTER — Other Ambulatory Visit: Payer: Self-pay | Admitting: Family Medicine

## 2017-11-30 ENCOUNTER — Other Ambulatory Visit: Payer: Self-pay | Admitting: Family Medicine

## 2017-11-30 DIAGNOSIS — R300 Dysuria: Secondary | ICD-10-CM

## 2017-12-01 ENCOUNTER — Encounter: Payer: Self-pay | Admitting: Family Medicine

## 2017-12-01 ENCOUNTER — Ambulatory Visit (INDEPENDENT_AMBULATORY_CARE_PROVIDER_SITE_OTHER): Payer: Medicare Other | Admitting: Family Medicine

## 2017-12-01 VITALS — BP 142/82 | HR 80 | Temp 97.1°F | Ht 63.0 in | Wt 169.0 lb

## 2017-12-01 DIAGNOSIS — M4726 Other spondylosis with radiculopathy, lumbar region: Secondary | ICD-10-CM | POA: Diagnosis not present

## 2017-12-01 DIAGNOSIS — R300 Dysuria: Secondary | ICD-10-CM

## 2017-12-01 DIAGNOSIS — E1169 Type 2 diabetes mellitus with other specified complication: Secondary | ICD-10-CM

## 2017-12-01 DIAGNOSIS — K219 Gastro-esophageal reflux disease without esophagitis: Secondary | ICD-10-CM

## 2017-12-01 DIAGNOSIS — I10 Essential (primary) hypertension: Secondary | ICD-10-CM

## 2017-12-01 DIAGNOSIS — E559 Vitamin D deficiency, unspecified: Secondary | ICD-10-CM | POA: Diagnosis not present

## 2017-12-01 DIAGNOSIS — W57XXXA Bitten or stung by nonvenomous insect and other nonvenomous arthropods, initial encounter: Secondary | ICD-10-CM

## 2017-12-01 DIAGNOSIS — G8929 Other chronic pain: Secondary | ICD-10-CM | POA: Diagnosis not present

## 2017-12-01 DIAGNOSIS — E785 Hyperlipidemia, unspecified: Secondary | ICD-10-CM | POA: Diagnosis not present

## 2017-12-01 DIAGNOSIS — E119 Type 2 diabetes mellitus without complications: Secondary | ICD-10-CM | POA: Diagnosis not present

## 2017-12-01 DIAGNOSIS — M545 Low back pain: Secondary | ICD-10-CM

## 2017-12-01 LAB — BAYER DCA HB A1C WAIVED: HB A1C (BAYER DCA - WAIVED): 6.8 % (ref <7.0)

## 2017-12-01 MED ORDER — MYRBETRIQ 25 MG PO TB24
25.0000 mg | ORAL_TABLET | Freq: Every day | ORAL | 3 refills | Status: DC
Start: 2017-12-01 — End: 2018-12-08

## 2017-12-01 MED ORDER — LISINOPRIL-HYDROCHLOROTHIAZIDE 10-12.5 MG PO TABS: ORAL_TABLET | ORAL | 3 refills | Status: DC

## 2017-12-01 MED ORDER — METFORMIN HCL 500 MG PO TABS: ORAL_TABLET | ORAL | 3 refills | Status: DC

## 2017-12-01 MED ORDER — PRAVASTATIN SODIUM 40 MG PO TABS: 40.0000 mg | ORAL_TABLET | Freq: Every day | ORAL | 3 refills | Status: DC

## 2017-12-01 NOTE — Progress Notes (Signed)
Subjective:    Patient ID: Debra Phillips, female    DOB: March 28, 1929, 84 y.o.   MRN: [id number]  HPI Pt here for follow up and management of chronic medical problems which includes diabetes and hypertension. She is taking medication regularly.  The patient today is complaining of the fact that she removed a tick from her right chest.  She also complains of neck ears and headache that she thinks is allergy related.  She complains of dry mouth and shortness of breath at times.  She is requesting refills on several of her medicines.  She is due to get a mammogram so we will check her breasts today make sure this mammogram gets done.  She will also get lab work done today.  Patient is alert and spry for her age of 84 years.  Her biggest complaints are pain in the neck and back and doing more than she should do and this increases the risk of falls and accidents and she is aware of this.  She does still complain of some tightness and shortness of breath with exertion but only with exertion.  She has no problems with her stomach including nausea vomiting diarrhea blood in the stool or black tarry bowel movements.  She has no problems with voiding other than frequency and she does see the urologist regularly about this and is also getting some therapy for helping her with bladder control.  She thinks this is helping.  She still periodically sees Dr. Carloyn Manner and he has sent her to [facility] imaging for injections in her back and this seems to have helped some in she was told by the imaging doctor that she could come back any time if she needed more injections.  She is also taking Tylenol arthritis for pain but does not like to take it during the day because it makes her drowsy.  I told her just to use a regular strength Tylenol to take during the day may be one in the morning one after lunch and do the Tylenol arthritis at bedtime.   Patient Active Problem List   Diagnosis Date Noted  . Dysuria 01/22/2017  .  Pelvic prolapse 08/28/2016  . Tremor 01/29/2016  . Abnormality of gait 01/29/2016  . Type 2 diabetes mellitus with hyperlipidemia ([place]) 01/14/2016  . Age-related macular degeneration 07/13/2015  . Keratoconjunctivitis sicca of both eyes ([place]) 07/13/2015  . Meibomian gland dysfunction (MGD), bilateral, both upper and lower lids 07/13/2015  . Pseudophakia of both eyes 07/13/2015  . Osteoarthritis of lumbar spine 05/08/2015  . Spondylosis of lumbar region without myelopathy or radiculopathy 09/01/2014  . Diabetes type 2, controlled ([place]) 04/26/2014  . Vitamin D deficiency 04/26/2014  . Chronic cystitis 04/26/2014  . Atrophic vaginitis 04/26/2014  . Solitary pulmonary nodule 03/20/2014  . Chest pain 03/18/2014  . Osteoporotic compression fracture of spine with routine healing 03/08/2014  . PALPITATIONS 02/27/2010  . Coronary atherosclerosis 12/12/2009  . Aortic aneurysm of unspecified site without mention of rupture 06/27/2009  . DYSPNEA 06/27/2009  . DYSLIPIDEMIA 12/12/2008  . Essential hypertension 12/12/2008  . GERD 12/12/2008  . OSTEOARTHRITIS 12/12/2008   Outpatient Encounter Medications as of 12/01/2017  Medication Sig  . acetaminophen (TYLENOL) 650 MG CR tablet Take 1,300 mg by mouth every 8 (eight) hours as needed for pain.  Marland Kitchen aspirin EC 81 MG tablet Take 81 mg by mouth daily.  . Calcium Carb-Cholecalciferol 600-500 MG-UNIT CAPS Take 1 capsule by mouth every morning.  . cephALEXin (KEFLEX) 250  MG capsule Take 1 capsule by mouth at bedtime.  . cetirizine (ZYRTEC) 5 MG tablet TAKE 1 TABLET DAILY  . Cholecalciferol 1000 UNITS tablet Take 1,000 Units by mouth daily.   Marland Kitchen CINNAMON PO Take 1 tablet by mouth daily.  Marland Kitchen diltiazem (CARDIZEM CD) 180 MG 24 hr capsule TAKE (1) CAPSULE DAILY  . ESTRACE VAGINAL 0.1 MG/GM vaginal cream Place 1 Applicatorful vaginally once a week.   Marland Kitchen lisinopril-hydrochlorothiazide (PRINZIDE,ZESTORETIC) 10-12.5 MG tablet TAKE 2 TABLETS DAILY FOR BLOOD PRESSURE    . metFORMIN (GLUCOPHAGE) 500 MG tablet TAKE (1) TABLET TWICE A DAY.  . Multiple Vitamins-Minerals (ICAPS AREDS 2 PO) Take 1 capsule by mouth daily.   Marland Kitchen MYRBETRIQ 25 MG TB24 tablet Take 1 tablet (25 mg total) by mouth daily.  . Omega-3 Fatty Acids (FISH OIL) 1000 MG CAPS Take 1,000 mg by mouth at bedtime.   Marland Kitchen omeprazole (PRILOSEC) 40 MG capsule Take 1 capsule (40 mg total) by mouth 2 (two) times daily.  . ONE TOUCH ULTRA TEST test strip CHECK BLOOD SUGAR ONCE A DAY  . potassium chloride (K-DUR) 10 MEQ tablet TAKE (1) TABLET DAILY AS DIRECTED.  Marland Kitchen pravastatin (PRAVACHOL) 40 MG tablet TAKE 1 TABLET DAILY  . furosemide (LASIX) 20 MG tablet Take 20 mg by mouth daily as needed for fluid or edema.   . nitroGLYCERIN (NITROSTAT) 0.4 MG SL tablet Place 1 tablet (0.4 mg total) under the tongue every 5 (five) minutes as needed for chest pain. (Patient not taking: Reported on 07/29/2017)  . traMADol (ULTRAM) 50 MG tablet Take 50 mg by mouth every 6 (six) hours as needed.  . [DISCONTINUED] baclofen (LIORESAL) 10 MG tablet Take 5 mg by mouth 2 (two) times daily.  . [DISCONTINUED] cefdinir (OMNICEF) 300 MG capsule Take 1 capsule (300 mg total) by mouth 2 (two) times daily. 1 po BID  . [DISCONTINUED] celecoxib (CELEBREX) 100 MG capsule Take 1 capsule by mouth daily.  . [DISCONTINUED] cetirizine (ZYRTEC) 5 MG tablet TAKE 1 TABLET DAILY  . [DISCONTINUED] diltiazem (CARDIZEM CD) 180 MG 24 hr capsule TAKE (1) CAPSULE DAILY  . [DISCONTINUED] diltiazem (CARDIZEM CD) 180 MG 24 hr capsule TAKE (1) CAPSULE DAILY  . [DISCONTINUED] fluticasone (FLONASE) 50 MCG/ACT nasal spray Place 2 sprays into both nostrils daily as needed for allergies or rhinitis.  . [DISCONTINUED] potassium chloride (K-DUR) 10 MEQ tablet TAKE (1) TABLET DAILY AS DIRECTED.   No facility-administered encounter medications on file as of 12/01/2017.       Review of Systems  Constitutional: Negative.   HENT: Negative.        Mouth stays dry Seasonal  allergies   Eyes: Negative.   Respiratory: Positive for shortness of breath (at times ).   Cardiovascular: Negative.   Gastrointestinal: Negative.   Endocrine: Negative.   Genitourinary: Negative.   Musculoskeletal: Negative.   Skin: Negative.        Tick removal - right chest   Allergic/Immunologic: Negative.   Neurological: Negative.   Hematological: Negative.   Psychiatric/Behavioral: Negative.        Objective:   Physical Exam  Constitutional: She is oriented to person, place, and time. She appears well-developed and well-nourished. No distress.  Patient is pleasant and positive even though she hurts a lot.  HENT:  Head: Normocephalic and atraumatic.  Right Ear: External ear normal.  Left Ear: External ear normal.  Nose: Nose normal.  Mouth/Throat: Oropharynx is clear and moist. No oropharyngeal exudate.  Eyes: Pupils are equal, round, and  reactive to light. Conjunctivae and EOM are normal. Right eye exhibits no discharge. Left eye exhibits no discharge. No scleral icterus.  Neck: Normal range of motion. Neck supple. No thyromegaly present.  No bruits thyromegaly or anterior cervical adenopathy  Cardiovascular: Normal rate, regular rhythm, normal heart sounds and intact distal pulses.  No murmur heard. Heart is regular at 72/min  Pulmonary/Chest: Effort normal and breath sounds normal. No respiratory distress. She has no wheezes. She has no rales.  Clear anteriorly and posteriorly  Abdominal: Soft. Bowel sounds are normal. She exhibits no mass. There is tenderness. There is no rebound and no guarding.  Slight left upper quadrant tenderness without liver or spleen enlargement or masses.  No bruits.  No suprapubic tenderness.  Musculoskeletal: She exhibits no edema or tenderness.  The patient is kyphotic and uses a cane for ambulation  Lymphadenopathy:    She has no cervical adenopathy.  Neurological: She is alert and oriented to person, place, and time. She has normal  reflexes. No cranial nerve deficit.  Skin: Skin is warm and dry. No rash noted.  A check that was a regular dog tick or wood tick was removed from her right anterior shoulder.  It appears it has not been there for long.  Psychiatric: She has a normal mood and affect. Her behavior is normal. Judgment and thought content normal.  The patient is very alert and has a keen sense of humor.  Nursing note and vitals reviewed.   BP (!) 142/82 (BP Location: Left Arm)   Pulse 80   Temp (!) 97.1 F (36.2 C) (Oral)   Ht '5\' 3"'  (1.6 m)   Wt 169 lb (76.7 kg)   SpO2 99%   BMI 29.94 kg/m        Assessment & Plan:  1. Controlled type 2 diabetes mellitus without complication, without long-term current use of insulin ([place]) -Continue with current treatment pending results of lab work - CBC with Differential/Platelet - Bayer Dalzell Hb A1c Waived  2. Essential hypertension -Continue with current treatment - CBC with Differential/Platelet - BMP8+EGFR - Lipid panel - Hepatic function panel  3. Vitamin D deficiency -Continue with vitamin D replacement pending results of lab work - CBC with Differential/Platelet - VITAMIN D 25 Hydroxy (Vit-D Deficiency, Fractures)  4. Gastroesophageal reflux disease, esophagitis presence not specified -No complaints today with reflux.  She will continue to watch her diet closely and avoid medicines and foods that can irritate her stomach more. - CBC with Differential/Platelet  5. Dysuria -Continue follow-up with urology - MYRBETRIQ 25 MG TB24 tablet; Take 1 tablet (25 mg total) by mouth daily.  Dispense: 90 tablet; Refill: 3  6. Type 2 diabetes mellitus with hyperlipidemia ([facility]) -Continue with metformin pending results of lab work  7. Tick bite, initial encounter -The tick that was removed today looks like it has just recently attached and there was actually no redness.  8. Chronic low back pain, unspecified back pain laterality, with sciatica presence  unspecified -Tinea with Tylenol as directed  9. Osteoarthritis of spine with radiculopathy, lumbar region -Tylenol as directed and stay as active as possible and do not put yourself at risk for falling and use your cane regularly and do not climb  Meds ordered this encounter  Medications  . metFORMIN (GLUCOPHAGE) 500 MG tablet    Sig: TAKE (1) TABLET TWICE A DAY.    Dispense:  180 tablet    Refill:  3  . lisinopril-hydrochlorothiazide (PRINZIDE,ZESTORETIC) 10-12.5 MG tablet  Sig: TAKE 2 TABLETS DAILY FOR BLOOD PRESSURE    Dispense:  180 tablet    Refill:  3  . pravastatin (PRAVACHOL) 40 MG tablet    Sig: Take 1 tablet (40 mg total) by mouth daily.    Dispense:  90 tablet    Refill:  3  . MYRBETRIQ 25 MG TB24 tablet    Sig: Take 1 tablet (25 mg total) by mouth daily.    Dispense:  90 tablet    Refill:  3   Patient Instructions                       Medicare Annual Wellness Visit  [place] and the medical providers at [facility] strive to bring you the best medical care.  In doing so we not only want to address your current medical conditions and concerns but also to detect new conditions early and prevent illness, disease and health-related problems.    Medicare offers a yearly Wellness Visit which allows our clinical staff to assess your need for preventative services including immunizations, lifestyle education, counseling to decrease risk of preventable diseases and screening for fall risk and other medical concerns.    This visit is provided free of charge (no copay) for all Medicare recipients. The clinical pharmacists at [facility] have begun to conduct these Wellness Visits which will also include a thorough review of all your medications.    As you primary medical provider recommend that you make an appointment for your Annual Wellness Visit if you have not done so already this year.  You may set up this appointment before  you leave today or you may call back ([phone]) and schedule an appointment.  Please make sure when you call that you mention that you are scheduling your Annual Wellness Visit with the clinical pharmacist so that the appointment may be made for the proper length of time.     Continue current medications. Continue good therapeutic lifestyle changes which include good diet and exercise. Fall precautions discussed with patient. If an FOBT was given today- please return it to our front desk. If you are over 69 years old - you may need Prevnar 20 or the adult Pneumonia vaccine.  **Flu shots are available--- please call and schedule a FLU-CLINIC appointment**  After your visit with [place] today you will receive a survey in the mail or online from [employer] regarding your care with [place]. Please take a moment to fill this out. Your feedback is very important to [place] as you can help [place] better understand your patient needs as well as improve your experience and satisfaction. WE CARE ABOUT YOU!!!   Try taking a regular strength Tylenol in the morning with arising and one after lunch every day.  Reserve the Tylenol arthritis for bedtime use. Do not climb! Do not be working where no one can see you so if you do fall you will be able to be helped. Continue to drink plenty of fluids Continue to follow-up with neurosurgery and radiology for possible injections Continue to follow-up with urology for therapy with bladder control Use deep Sherral Hammers off before going out in the yard sprayed this on your lower legs to keep from getting more ticks on your body  Arrie Senate MD

## 2017-12-01 NOTE — Patient Instructions (Addendum)
Medicare Annual Wellness Visit  [place] and the medical providers at [facility] strive to bring you the best medical care.  In doing so we not only want to address your current medical conditions and concerns but also to detect new conditions early and prevent illness, disease and health-related problems.    Medicare offers a yearly Wellness Visit which allows our clinical staff to assess your need for preventative services including immunizations, lifestyle education, counseling to decrease risk of preventable diseases and screening for fall risk and other medical concerns.    This visit is provided free of charge (no copay) for all Medicare recipients. The clinical pharmacists at [facility] have begun to conduct these Wellness Visits which will also include a thorough review of all your medications.    As you primary medical provider recommend that you make an appointment for your Annual Wellness Visit if you have not done so already this year.  You may set up this appointment before you leave today or you may call back ([phone]) and schedule an appointment.  Please make sure when you call that you mention that you are scheduling your Annual Wellness Visit with the clinical pharmacist so that the appointment may be made for the proper length of time.     Continue current medications. Continue good therapeutic lifestyle changes which include good diet and exercise. Fall precautions discussed with patient. If an FOBT was given today- please return it to our front desk. If you are over 66 years old - you may need Prevnar 58 or the adult Pneumonia vaccine.  **Flu shots are available--- please call and schedule a FLU-CLINIC appointment**  After your visit with [place] today you will receive a survey in the mail or online from [employer] regarding your care with [place]. Please take a moment to fill this out. Your feedback is very  important to [place] as you can help [place] better understand your patient needs as well as improve your experience and satisfaction. WE CARE ABOUT YOU!!!   Try taking a regular strength Tylenol in the morning with arising and one after lunch every day.  Reserve the Tylenol arthritis for bedtime use. Do not climb! Do not be working where no one can see you so if you do fall you will be able to be helped. Continue to drink plenty of fluids Continue to follow-up with neurosurgery and radiology for possible injections Continue to follow-up with urology for therapy with bladder control Use deep Sherral Hammers off before going out in the yard sprayed this on your lower legs to keep from getting more ticks on your body

## 2017-12-02 LAB — BMP8+EGFR
BUN / CREAT RATIO: 13 (ref 12–28)
BUN: 8 mg/dL (ref 8–27)
CO2: 26 mmol/L (ref 20–29)
CREATININE: 0.63 mg/dL (ref 0.57–1.00)
Calcium: 9.4 mg/dL (ref 8.7–10.3)
Chloride: 91 mmol/L — ABNORMAL LOW (ref 96–106)
GFR, EST AFRICAN AMERICAN: 93 mL/min/{1.73_m2} (ref >59)
GFR, EST NON AFRICAN AMERICAN: 81 mL/min/{1.73_m2} (ref >59)
Glucose: 151 mg/dL — ABNORMAL HIGH (ref 65–99)
POTASSIUM: 4.5 mmol/L (ref 3.5–5.2)
SODIUM: 132 mmol/L — AB (ref 134–144)

## 2017-12-02 LAB — LIPID PANEL
CHOL/HDL RATIO: 3.1 ratio (ref 0.0–4.4)
Cholesterol, Total: 162 mg/dL (ref 100–199)
HDL: 53 mg/dL (ref >39)
LDL CALC: 76 mg/dL (ref 0–99)
Triglycerides: 164 mg/dL — ABNORMAL HIGH (ref 0–149)
VLDL Cholesterol Cal: 33 mg/dL (ref 5–40)

## 2017-12-02 LAB — CBC WITH DIFFERENTIAL/PLATELET
BASOS ABS: 0 10*3/uL (ref 0.0–0.2)
Basos: 0 %
EOS (ABSOLUTE): 0.1 10*3/uL (ref 0.0–0.4)
Eos: 1 %
HEMATOCRIT: 38.8 % (ref 34.0–46.6)
HEMOGLOBIN: 13.1 g/dL (ref 11.1–15.9)
Immature Grans (Abs): 0 10*3/uL (ref 0.0–0.1)
Immature Granulocytes: 0 %
LYMPHS ABS: 2.7 10*3/uL (ref 0.7–3.1)
LYMPHS: 31 %
MCH: 31.5 pg (ref 26.6–33.0)
MCHC: 33.8 g/dL (ref 31.5–35.7)
MCV: 93 fL (ref 79–97)
MONOCYTES: 7 %
Monocytes Absolute: 0.6 10*3/uL (ref 0.1–0.9)
NEUTROS ABS: 5.2 10*3/uL (ref 1.4–7.0)
Neutrophils: 61 %
Platelets: 338 10*3/uL (ref 150–379)
RBC: 4.16 x10E6/uL (ref 3.77–5.28)
RDW: 13.9 % (ref 12.3–15.4)
WBC: 8.6 10*3/uL (ref 3.4–10.8)

## 2017-12-02 LAB — HEPATIC FUNCTION PANEL
ALBUMIN: 4.5 g/dL (ref 3.5–4.7)
ALT: 20 IU/L (ref 0–32)
AST: 22 IU/L (ref 0–40)
Alkaline Phosphatase: 55 IU/L (ref 39–117)
BILIRUBIN TOTAL: 0.5 mg/dL (ref 0.0–1.2)
Bilirubin, Direct: 0.16 mg/dL (ref 0.00–0.40)
TOTAL PROTEIN: 6.6 g/dL (ref 6.0–8.5)

## 2017-12-02 LAB — VITAMIN D 25 HYDROXY (VIT D DEFICIENCY, FRACTURES): Vit D, 25-Hydroxy: 46.7 ng/mL (ref 30.0–100.0)

## 2017-12-03 ENCOUNTER — Telehealth: Payer: Self-pay | Admitting: Family Medicine

## 2017-12-03 ENCOUNTER — Other Ambulatory Visit: Payer: Self-pay | Admitting: *Deleted

## 2017-12-03 DIAGNOSIS — R3982 Chronic bladder pain: Secondary | ICD-10-CM | POA: Diagnosis not present

## 2017-12-03 DIAGNOSIS — M6289 Other specified disorders of muscle: Secondary | ICD-10-CM | POA: Diagnosis not present

## 2017-12-03 DIAGNOSIS — R350 Frequency of micturition: Secondary | ICD-10-CM | POA: Diagnosis not present

## 2017-12-03 DIAGNOSIS — M6281 Muscle weakness (generalized): Secondary | ICD-10-CM | POA: Diagnosis not present

## 2017-12-03 DIAGNOSIS — M62838 Other muscle spasm: Secondary | ICD-10-CM | POA: Diagnosis not present

## 2017-12-03 DIAGNOSIS — E871 Hypo-osmolality and hyponatremia: Secondary | ICD-10-CM

## 2017-12-03 NOTE — Telephone Encounter (Signed)
Patient aware of results.

## 2017-12-21 ENCOUNTER — Other Ambulatory Visit: Payer: Self-pay | Admitting: Family

## 2017-12-21 ENCOUNTER — Observation Stay (HOSPITAL_COMMUNITY)
Admission: EM | Admit: 2017-12-21 | Discharge: 2017-12-23 | Disposition: A | Discharge status: Home / Self Care | Payer: Medicare Other | Attending: Internal Medicine | Admitting: Internal Medicine

## 2017-12-21 DIAGNOSIS — R079 Chest pain, unspecified: Secondary | ICD-10-CM | POA: Diagnosis not present

## 2017-12-21 DIAGNOSIS — Z885 Allergy status to narcotic agent status: Secondary | ICD-10-CM | POA: Diagnosis not present

## 2017-12-21 DIAGNOSIS — E871 Hypo-osmolality and hyponatremia: Secondary | ICD-10-CM | POA: Diagnosis present

## 2017-12-21 DIAGNOSIS — Z049 Encounter for examination and observation for unspecified reason: Secondary | ICD-10-CM | POA: Diagnosis not present

## 2017-12-21 DIAGNOSIS — I152 Hypertension secondary to endocrine disorders: Secondary | ICD-10-CM | POA: Diagnosis present

## 2017-12-21 DIAGNOSIS — M545 Low back pain: Secondary | ICD-10-CM | POA: Diagnosis not present

## 2017-12-21 DIAGNOSIS — E119 Type 2 diabetes mellitus without complications: Secondary | ICD-10-CM | POA: Diagnosis not present

## 2017-12-21 DIAGNOSIS — S39012A Strain of muscle, fascia and tendon of lower back, initial encounter: Secondary | ICD-10-CM | POA: Diagnosis not present

## 2017-12-21 DIAGNOSIS — I10 Essential (primary) hypertension: Secondary | ICD-10-CM | POA: Diagnosis not present

## 2017-12-21 DIAGNOSIS — E1159 Type 2 diabetes mellitus with other circulatory complications: Secondary | ICD-10-CM | POA: Diagnosis present

## 2017-12-21 DIAGNOSIS — K219 Gastro-esophageal reflux disease without esophagitis: Secondary | ICD-10-CM | POA: Diagnosis not present

## 2017-12-21 DIAGNOSIS — Z882 Allergy status to sulfonamides status: Secondary | ICD-10-CM | POA: Diagnosis not present

## 2017-12-21 DIAGNOSIS — E785 Hyperlipidemia, unspecified: Secondary | ICD-10-CM | POA: Diagnosis not present

## 2017-12-21 DIAGNOSIS — Z7984 Long term (current) use of oral hypoglycemic drugs: Secondary | ICD-10-CM | POA: Diagnosis not present

## 2017-12-21 DIAGNOSIS — I700 Atherosclerosis of aorta: Secondary | ICD-10-CM | POA: Diagnosis not present

## 2017-12-21 DIAGNOSIS — K222 Esophageal obstruction: Secondary | ICD-10-CM | POA: Diagnosis not present

## 2017-12-21 DIAGNOSIS — W1830XA Fall on same level, unspecified, initial encounter: Secondary | ICD-10-CM | POA: Diagnosis not present

## 2017-12-21 DIAGNOSIS — S199XXA Unspecified injury of neck, initial encounter: Secondary | ICD-10-CM | POA: Diagnosis not present

## 2017-12-21 DIAGNOSIS — R2689 Other abnormalities of gait and mobility: Secondary | ICD-10-CM | POA: Diagnosis not present

## 2017-12-21 DIAGNOSIS — R0789 Other chest pain: Secondary | ICD-10-CM | POA: Insufficient documentation

## 2017-12-21 DIAGNOSIS — E1169 Type 2 diabetes mellitus with other specified complication: Secondary | ICD-10-CM | POA: Diagnosis present

## 2017-12-21 DIAGNOSIS — E869 Volume depletion, unspecified: Secondary | ICD-10-CM | POA: Insufficient documentation

## 2017-12-21 DIAGNOSIS — W19XXXA Unspecified fall, initial encounter: Secondary | ICD-10-CM

## 2017-12-21 DIAGNOSIS — S060X1A Concussion with loss of consciousness of 30 minutes or less, initial encounter: Principal | ICD-10-CM

## 2017-12-21 DIAGNOSIS — Y92090 Kitchen in other non-institutional residence as the place of occurrence of the external cause: Secondary | ICD-10-CM | POA: Diagnosis not present

## 2017-12-21 DIAGNOSIS — S0003XA Contusion of scalp, initial encounter: Secondary | ICD-10-CM | POA: Insufficient documentation

## 2017-12-21 DIAGNOSIS — Z881 Allergy status to other antibiotic agents status: Secondary | ICD-10-CM | POA: Diagnosis not present

## 2017-12-21 DIAGNOSIS — Y9301 Activity, walking, marching and hiking: Secondary | ICD-10-CM | POA: Diagnosis not present

## 2017-12-21 DIAGNOSIS — S161XXA Strain of muscle, fascia and tendon at neck level, initial encounter: Secondary | ICD-10-CM

## 2017-12-21 DIAGNOSIS — R52 Pain, unspecified: Secondary | ICD-10-CM | POA: Diagnosis present

## 2017-12-21 DIAGNOSIS — S0990XA Unspecified injury of head, initial encounter: Secondary | ICD-10-CM | POA: Diagnosis not present

## 2017-12-21 DIAGNOSIS — W010XXA Fall on same level from slipping, tripping and stumbling without subsequent striking against object, initial encounter: Secondary | ICD-10-CM | POA: Insufficient documentation

## 2017-12-21 DIAGNOSIS — Z7982 Long term (current) use of aspirin: Secondary | ICD-10-CM | POA: Diagnosis not present

## 2017-12-21 DIAGNOSIS — Z79899 Other long term (current) drug therapy: Secondary | ICD-10-CM | POA: Insufficient documentation

## 2017-12-21 DIAGNOSIS — S299XXA Unspecified injury of thorax, initial encounter: Secondary | ICD-10-CM | POA: Diagnosis not present

## 2017-12-21 MED ORDER — FENTANYL CITRATE (PF) 100 MCG/2ML IJ SOLN
25.0000 ug | Freq: Once | INTRAMUSCULAR | Status: AC
  Administered 2017-12-21: 25 ug via INTRAVENOUS
  Filled 2017-12-21: qty 2

## 2017-12-21 MED ORDER — ONDANSETRON HCL 4 MG/2ML IJ SOLN
4.0000 mg | Freq: Once | INTRAMUSCULAR | Status: AC
  Administered 2017-12-21: 4 mg via INTRAVENOUS
  Filled 2017-12-21: qty 2

## 2017-12-21 NOTE — ED Notes (Signed)
ED Provider at bedside. 

## 2017-12-21 NOTE — ED Provider Notes (Signed)
[facility] EMERGENCY DEPARTMENT Provider Note   CSN: [id number] Arrival date & time: 12/21/17  2258     History   Chief Complaint Chief Complaint  Patient presents with  . Fall    HPI [facility] Debra Phillips is a 84 y.o. female.  The history is provided by the patient and a relative.  Fall  This is a new problem. Episode onset: Prior to arrival. The problem occurs constantly. The problem has not changed since onset.Associated symptoms include headaches. Pertinent negatives include no chest pain and no abdominal pain. The symptoms are aggravated by walking. The symptoms are relieved by rest.   Patient with a history of diabetes, GERD, hypertension presents status post fall She reports she lost her balance and fell striking the back of her head.  She does report LOC.  Apparently she is stood up was able to get to a chair and called her family.  Once her family arrived they called ambulance She is reporting headache, neck pain, back pain.  No chest or abdominal pain reported.  No extremity trauma is reported She does not take anticoagulants Past Medical History:  Diagnosis Date  . Abnormality of gait 01/29/2016  . Aortic root dilatation (HCC)    Mild  . Cataract   . Diabetes mellitus without complication ([place])    type 2  . Dyslipidemia   . Dyspnea    with exertion  . Dysrhythmia    hx of occ palpitations  . Esophageal stricture    s/p dilation  . GERD (gastroesophageal reflux disease)   . Headache   . History of hiatal hernia   . Hyperlipidemia   . Hypertension   . Macular pucker, left eye   . Osteoarthritis   . Osteoporosis   . Tinnitus    left ear last week or so    Patient Active Problem List   Diagnosis Date Noted  . Dysuria 01/22/2017  . Pelvic prolapse 08/28/2016  . Tremor 01/29/2016  . Abnormality of gait 01/29/2016  . Type 2 diabetes mellitus with hyperlipidemia ([place]) 01/14/2016  . Age-related macular degeneration 07/13/2015  .  Keratoconjunctivitis sicca of both eyes ([place]) 07/13/2015  . Meibomian gland dysfunction (MGD), bilateral, both upper and lower lids 07/13/2015  . Pseudophakia of both eyes 07/13/2015  . Osteoarthritis of lumbar spine 05/08/2015  . Spondylosis of lumbar region without myelopathy or radiculopathy 09/01/2014  . Diabetes type 2, controlled ([place]) 04/26/2014  . Vitamin D deficiency 04/26/2014  . Chronic cystitis 04/26/2014  . Atrophic vaginitis 04/26/2014  . Solitary pulmonary nodule 03/20/2014  . Chest pain 03/18/2014  . Osteoporotic compression fracture of spine with routine healing 03/08/2014  . PALPITATIONS 02/27/2010  . Coronary atherosclerosis 12/12/2009  . Aortic aneurysm of unspecified site without mention of rupture 06/27/2009  . DYSPNEA 06/27/2009  . DYSLIPIDEMIA 12/12/2008  . Essential hypertension 12/12/2008  . GERD 12/12/2008  . OSTEOARTHRITIS 12/12/2008    Past Surgical History:  Procedure Laterality Date  . ABDOMINAL HYSTERECTOMY     partial  . APPENDECTOMY    . BACK SURGERY     lower x 1  . CARDIAC CATHETERIZATION  2010   both cataracts  . CHOLECYSTECTOMY  1993  . CYSTOSCOPY N/A 08/28/2016   Procedure: CYSTOSCOPY;  Surgeon: Carolan Clines, MD;  Location: [facility];  Service: Urology;  Laterality: N/A;  . EYE SURGERY     cataracts  . EYE SURGERY     for macular pucker  . FRACTURE SURGERY  rt wrist2010  . HARDWARE REMOVAL  12/10/2011   Procedure: HARDWARE REMOVAL;  Surgeon: Schuyler Amor, MD;  Location: [place];  Service: Orthopedics;  Laterality: Right;  radial head  . KNEE ARTHROSCOPY  2004   Left and right  . KYPHOSIS SURGERY    . LUMBAR FUSION  [id number]  . RADIAL HEAD ARTHROPLASTY  5/12   orif rt radial head  . SHOULDER SURGERY     Right rotator cuff epair  . TOTAL KNEE ARTHROPLASTY     Left  . VAGINAL PROLAPSE REPAIR N/A 08/28/2016   Procedure: ANTERIOR VAGINAL VAULT SUSPENSION WITH SACROSPINOUS FIXATION;  Surgeon: Carolan Clines, MD;  Location: [facility];  Service: Urology;  Laterality: N/A;     OB History   None      Home Medications    Prior to Admission medications   Medication Sig Start Date End Date Taking? Authorizing Provider  acetaminophen (TYLENOL) 650 MG CR tablet Take 1,300 mg by mouth every 8 (eight) hours as needed for pain.    [provider]  aspirin EC 81 MG tablet Take 81 mg by mouth daily.    [provider]  Calcium Carb-Cholecalciferol 600-500 MG-UNIT CAPS Take 1 capsule by mouth every morning.    [provider]  cephALEXin (KEFLEX) 250 MG capsule Take 1 capsule by mouth at bedtime. 11/19/17   [provider]  cetirizine (ZYRTEC) 5 MG tablet TAKE 1 TABLET DAILY 08/26/17   Chipper Herb, MD  Cholecalciferol 1000 UNITS tablet Take 1,000 Units by mouth daily.     [provider]  CINNAMON PO Take 1 tablet by mouth daily.    [provider]  diltiazem (CARDIZEM CD) 180 MG 24 hr capsule TAKE (1) CAPSULE DAILY 03/30/17   Chipper Herb, MD  ESTRACE VAGINAL 0.1 MG/GM vaginal cream Place 1 Applicatorful vaginally once a week.  12/25/15   [provider]  furosemide (LASIX) 20 MG tablet Take 20 mg by mouth daily as needed for fluid or edema.  08/01/16   [provider]  lisinopril-hydrochlorothiazide (PRINZIDE,ZESTORETIC) 10-12.5 MG tablet TAKE 2 TABLETS DAILY FOR BLOOD PRESSURE 12/01/17   Chipper Herb, MD  metFORMIN (GLUCOPHAGE) 500 MG tablet TAKE (1) TABLET TWICE A DAY. 12/01/17   Chipper Herb, MD  Multiple Vitamins-Minerals (ICAPS AREDS 2 PO) Take 1 capsule by mouth daily.     [provider]  MYRBETRIQ 25 MG TB24 tablet Take 1 tablet (25 mg total) by mouth daily. 12/01/17   Chipper Herb, MD  nitroGLYCERIN (NITROSTAT) 0.4 MG SL tablet Place 1 tablet (0.4 mg total) under the tongue every 5 (five) minutes as needed for chest pain. Patient not taking: Reported on 07/29/2017 11/02/15   Minus Breeding, MD  Omega-3  Fatty Acids (FISH OIL) 1000 MG CAPS Take 1,000 mg by mouth at bedtime.     [provider]  omeprazole (PRILOSEC) 40 MG capsule Take 1 capsule (40 mg total) by mouth 2 (two) times daily. 12/21/17   Sharion Balloon, FNP  ONE TOUCH ULTRA TEST test strip CHECK BLOOD SUGAR ONCE A DAY 12/18/16   Chipper Herb, MD  potassium chloride (K-DUR) 10 MEQ tablet TAKE (1) TABLET DAILY AS DIRECTED. 12/08/16   Chipper Herb, MD  pravastatin (PRAVACHOL) 40 MG tablet Take 1 tablet (40 mg total) by mouth daily. 12/01/17   Chipper Herb, MD  traMADol (ULTRAM) 50 MG tablet Take 50 mg by mouth every 6 (six)  hours as needed.    [provider]    Family History Family History  Problem Relation Age of Onset  . Pneumonia Mother   . Brain cancer Father   . Tremor Father   . Arthritis Sister   . COPD Sister   . Emphysema Brother   . COPD Brother   . Tremor Brother   . Lung cancer Brother   . Heart disease Brother   . Liver cancer Brother   . Diabetes Other         1 child has diabetes at age 39 and the other 22 are healthy    Social History Social History   Tobacco Use  . Smoking status: Never Smoker  . Smokeless tobacco: Never Used  Substance Use Topics  . Alcohol use: No  . Drug use: No     Allergies   Codeine; Doxycycline; Other; Sulfonamide derivatives; and Vancomycin   Review of Systems Review of Systems  Constitutional: Negative for fever.  Cardiovascular: Negative for chest pain.  Gastrointestinal: Negative for abdominal pain.  Musculoskeletal: Positive for back pain and neck pain.  Neurological: Positive for headaches.  All other systems reviewed and are negative.    Physical Exam Updated Vital Signs BP (!) 160/85 (BP Location: Right Arm)   Pulse 73   Temp 98.7 F (37.1 C) (Oral)   Ht 1.6 m (5\' 3" )   SpO2 99%   BMI 29.94 kg/m   Physical Exam  CONSTITUTIONAL: Elderly, frail HEAD: Tenderness contusion noted to the posterior occiput, no lacerations  noted EYES: EOMI/PERRL ENMT: Mucous membranes moist, no facial trauma NECK: Collar in place SPINE/BACK: Cervical spine tenderness CV: S1/S2 noted, no murmurs/rubs/gallops noted LUNGS: Lungs are clear to auscultation bilaterally, no apparent distress Chest-no tenderness or deformity ABDOMEN: soft, nontender GU:no cva tenderness NEURO: Pt is awake/alert/appropriate, moves all extremitiesx4.  No facial droop.  No focal weakness noted EXTREMITIES: pulses normal/equal, full ROM Pelvis stable, all other extremities/joints palpated/ranged and nontender SKIN: warm, color normal PSYCH: no abnormalities of mood noted, alert and oriented to situation  ED Treatments / Results  Labs (all labs ordered are listed, but only abnormal results are displayed) Labs Reviewed  BASIC METABOLIC PANEL - Abnormal; Notable for the following components:      Result Value   Sodium 129 (*)    Chloride 93 (*)    Glucose, Bld 179 (*)    All other components within normal limits  CBC WITH DIFFERENTIAL/PLATELET - Abnormal; Notable for the following components:   WBC 12.0 (*)    Neutro Abs 8.8 (*)    All other components within normal limits  URINALYSIS, ROUTINE W REFLEX MICROSCOPIC - Abnormal; Notable for the following components:   Color, Urine STRAW (*)    All other components within normal limits  TROPONIN I  BASIC METABOLIC PANEL  CBC  TROPONIN I  TROPONIN I  TROPONIN I    EKG EKG Interpretation  Date/Time:  Monday December 21 2017 23:47:08 EDT Ventricular Rate:  75 PR Interval:  244 QRS Duration: 106 QT Interval:  422 QTC Calculation: 471 R Axis:   -39 Text Interpretation:  Sinus rhythm with 1st degree A-V block Left axis deviation Abnormal ECG Confirmed by Ripley Fraise ([id number]) on 12/22/2017 12:17:50 AM   Radiology Dg Chest 1 View  Result Date: 12/22/2017 CLINICAL DATA:  [place] tonight.  Pain in the neck and head. EXAM: CHEST  1 VIEW COMPARISON:  06/13/2016 FINDINGS: Cardiac enlargement. No  vascular congestion or edema. No  blunting of costophrenic angles. No pneumothorax. Calcified and tortuous aorta. Surgical clips in the right upper quadrant. Previous kyphoplasty changes in the lumbar spine. IMPRESSION: Cardiac enlargement. No evidence of active pulmonary disease. Aortic atherosclerosis. Electronically Signed   By: Lucienne Capers M.D.   On: 12/22/2017 00:32   Dg Lumbar Spine Complete  Result Date: 12/22/2017 CLINICAL DATA:  Fall with back pain EXAM: LUMBAR SPINE - COMPLETE 4+ VIEW COMPARISON:  07/29/2017 FINDINGS: Surgical clips in the right upper quadrant. Bilateral SI joint sclerosis, left greater than right. Posterior stabilization rod and fixating screws at L4-L5 with interbody device. 9 mm anterolisthesis of L4 on L5, no change. Treated compression deformity at L2, no change. Mild degenerative changes at L5-S1 and at the lower thoracic spine. IMPRESSION: 1. No definite acute osseous abnormality 2. Postsurgical changes at L4-L5 with evidence of treated compression deformity at L2. Electronically Signed   By: Donavan Foil M.D.   On: 12/22/2017 00:34   Ct Head Wo Contrast  Result Date: 12/22/2017 CLINICAL DATA:  [place] in kitchen hematoma to the posterior head and neck EXAM: CT HEAD WITHOUT CONTRAST CT CERVICAL SPINE WITHOUT CONTRAST TECHNIQUE: Multidetector CT imaging of the head and cervical spine was performed following the standard protocol without intravenous contrast. Multiplanar CT image reconstructions of the cervical spine were also generated. COMPARISON:  CT brain 02/06/2016, radiograph 01/14/2016 FINDINGS: CT HEAD FINDINGS Brain: No acute territorial infarction, hemorrhage or intracranial mass is visualized. Mild to moderate atrophy. Minimal small vessel ischemic changes of the white matter. Stable ventricle size. Vascular: No hyperdense vessels. Minimal carotid vascular calcification. Skull: No fracture Sinuses/Orbits: No acute finding. Other: Small left posterior parietal  scalp hematoma CT CERVICAL SPINE FINDINGS Alignment: No subluxation.  Facet alignment within normal limits. Skull base and vertebrae: No acute fracture. No primary bone lesion or focal pathologic process. Soft tissues and spinal canal: No prevertebral fluid or swelling. No visible canal hematoma. Disc levels: Mild degenerative changes at C5-C6, moderate degenerative changes at C6-C7. Upper chest: Lung apices are clear. Multiple hypodense thyroid nodules measuring up to 13 mm. Other: None IMPRESSION: 1. No CT evidence for acute intracranial abnormality. Atrophy with minimal small vessel ischemic changes of the white matter. Small left posterior parietal scalp hematoma 2. No acute osseous abnormality of the cervical spine 3. Multiple hypodense thyroid nodules. Electronically Signed   By: Donavan Foil M.D.   On: 12/22/2017 00:49   Ct Cervical Spine Wo Contrast  Result Date: 12/22/2017 CLINICAL DATA:  [place] in kitchen hematoma to the posterior head and neck EXAM: CT HEAD WITHOUT CONTRAST CT CERVICAL SPINE WITHOUT CONTRAST TECHNIQUE: Multidetector CT imaging of the head and cervical spine was performed following the standard protocol without intravenous contrast. Multiplanar CT image reconstructions of the cervical spine were also generated. COMPARISON:  CT brain 02/06/2016, radiograph 01/14/2016 FINDINGS: CT HEAD FINDINGS Brain: No acute territorial infarction, hemorrhage or intracranial mass is visualized. Mild to moderate atrophy. Minimal small vessel ischemic changes of the white matter. Stable ventricle size. Vascular: No hyperdense vessels. Minimal carotid vascular calcification. Skull: No fracture Sinuses/Orbits: No acute finding. Other: Small left posterior parietal scalp hematoma CT CERVICAL SPINE FINDINGS Alignment: No subluxation.  Facet alignment within normal limits. Skull base and vertebrae: No acute fracture. No primary bone lesion or focal pathologic process. Soft tissues and spinal canal: No  prevertebral fluid or swelling. No visible canal hematoma. Disc levels: Mild degenerative changes at C5-C6, moderate degenerative changes at C6-C7. Upper chest: Lung apices are clear. Multiple hypodense  thyroid nodules measuring up to 13 mm. Other: None IMPRESSION: 1. No CT evidence for acute intracranial abnormality. Atrophy with minimal small vessel ischemic changes of the white matter. Small left posterior parietal scalp hematoma 2. No acute osseous abnormality of the cervical spine 3. Multiple hypodense thyroid nodules. Electronically Signed   By: Donavan Foil M.D.   On: 12/22/2017 00:49    Procedures Procedures  Medications Ordered in ED Medications  fentaNYL (SUBLIMAZE) injection 25 mcg (has no administration in time range)  ondansetron (ZOFRAN) injection 4 mg (has no administration in time range)     Initial Impression / Assessment and Plan / ED Course  I have reviewed the triage vital signs and the nursing notes.  Pertinent labs & imaging results that were available during my care of the patient were reviewed by me and considered in my medical decision making (see chart for details).     Patient presents to the  emergency department after fall.  No signs of acute traumatic injury.  CT head negative.  While in ED she had an episode of chest tightness.  This is now improved.  Will admit for monitoring and chest pain work-up.  Discussed with Dr. Hal Hope for admission  Final Clinical Impressions(s) / ED Diagnoses   Final diagnoses:  Chest pain, rule out acute myocardial infarction  Concussion with loss of consciousness of 30 minutes or less, initial encounter  Strain of neck muscle, initial encounter  Back strain, initial encounter  Fall, initial encounter    ED Discharge Orders    None       Ripley Fraise, MD 12/22/17 [phone]

## 2017-12-21 NOTE — ED Triage Notes (Signed)
Pt arrives via [facility]; fell in kitchen this PM; hit head on kitchen sink. Unknown LOC. Hematoma on back of head, per EMS swelling of neck, placed in C-collar by EMS  Denies vomiting but has nausea  Pain 5/10; Faces 8

## 2017-12-21 NOTE — ED Notes (Signed)
Pt was pushed to trauma A due to extreme need of having to urinate. Could not perform in hallway.

## 2017-12-22 ENCOUNTER — Other Ambulatory Visit: Payer: Self-pay

## 2017-12-22 ENCOUNTER — Emergency Department (HOSPITAL_COMMUNITY): Payer: Medicare Other

## 2017-12-22 ENCOUNTER — Encounter (HOSPITAL_COMMUNITY): Payer: Self-pay | Admitting: Internal Medicine

## 2017-12-22 DIAGNOSIS — W19XXXD Unspecified fall, subsequent encounter: Secondary | ICD-10-CM | POA: Diagnosis not present

## 2017-12-22 DIAGNOSIS — R079 Chest pain, unspecified: Secondary | ICD-10-CM | POA: Diagnosis not present

## 2017-12-22 DIAGNOSIS — I10 Essential (primary) hypertension: Secondary | ICD-10-CM

## 2017-12-22 DIAGNOSIS — S199XXA Unspecified injury of neck, initial encounter: Secondary | ICD-10-CM | POA: Diagnosis not present

## 2017-12-22 DIAGNOSIS — S0003XA Contusion of scalp, initial encounter: Secondary | ICD-10-CM | POA: Diagnosis not present

## 2017-12-22 DIAGNOSIS — E871 Hypo-osmolality and hyponatremia: Secondary | ICD-10-CM | POA: Diagnosis present

## 2017-12-22 DIAGNOSIS — S060X1A Concussion with loss of consciousness of 30 minutes or less, initial encounter: Secondary | ICD-10-CM | POA: Diagnosis not present

## 2017-12-22 DIAGNOSIS — E1169 Type 2 diabetes mellitus with other specified complication: Secondary | ICD-10-CM | POA: Diagnosis not present

## 2017-12-22 DIAGNOSIS — S299XXA Unspecified injury of thorax, initial encounter: Secondary | ICD-10-CM | POA: Diagnosis not present

## 2017-12-22 DIAGNOSIS — M545 Low back pain: Secondary | ICD-10-CM | POA: Diagnosis not present

## 2017-12-22 DIAGNOSIS — E785 Hyperlipidemia, unspecified: Secondary | ICD-10-CM | POA: Diagnosis not present

## 2017-12-22 LAB — URINALYSIS, ROUTINE W REFLEX MICROSCOPIC
BILIRUBIN URINE: NEGATIVE
GLUCOSE, UA: NEGATIVE mg/dL
Hgb urine dipstick: NEGATIVE
KETONES UR: NEGATIVE mg/dL
Leukocytes, UA: NEGATIVE
Nitrite: NEGATIVE
PH: 7 (ref 5.0–8.0)
Protein, ur: NEGATIVE mg/dL
SPECIFIC GRAVITY, URINE: 1.008 (ref 1.005–1.030)

## 2017-12-22 LAB — D-DIMER, QUANTITATIVE: D-Dimer, Quant: 0.75 ug/mL-FEU — ABNORMAL HIGH (ref 0.00–0.50)

## 2017-12-22 LAB — CBC WITH DIFFERENTIAL/PLATELET
BASOS ABS: 0 10*3/uL (ref 0.0–0.1)
Basophils Relative: 0 %
Eosinophils Absolute: 0.1 10*3/uL (ref 0.0–0.7)
Eosinophils Relative: 1 %
HEMATOCRIT: 37.5 % (ref 36.0–46.0)
Hemoglobin: 12.8 g/dL (ref 12.0–15.0)
LYMPHS PCT: 21 %
Lymphs Abs: 2.5 10*3/uL (ref 0.7–4.0)
MCH: 31.3 pg (ref 26.0–34.0)
MCHC: 34.1 g/dL (ref 30.0–36.0)
MCV: 91.7 fL (ref 78.0–100.0)
MONO ABS: 0.6 10*3/uL (ref 0.1–1.0)
Monocytes Relative: 5 %
NEUTROS ABS: 8.8 10*3/uL — AB (ref 1.7–7.7)
Neutrophils Relative %: 73 %
Platelets: 285 10*3/uL (ref 150–400)
RBC: 4.09 MIL/uL (ref 3.87–5.11)
RDW: 12.7 % (ref 11.5–15.5)
WBC: 12 10*3/uL — AB (ref 4.0–10.5)

## 2017-12-22 LAB — BASIC METABOLIC PANEL
ANION GAP: 12 (ref 5–15)
Anion gap: 12 (ref 5–15)
BUN: 9 mg/dL (ref 6–20)
BUN: 9 mg/dL (ref 6–20)
CALCIUM: 9.1 mg/dL (ref 8.9–10.3)
CHLORIDE: 96 mmol/L — AB (ref 101–111)
CO2: 24 mmol/L (ref 22–32)
CO2: 24 mmol/L (ref 22–32)
CREATININE: 0.78 mg/dL (ref 0.44–1.00)
Calcium: 8.9 mg/dL (ref 8.9–10.3)
Chloride: 93 mmol/L — ABNORMAL LOW (ref 101–111)
Creatinine, Ser: 0.7 mg/dL (ref 0.44–1.00)
GFR calc Af Amer: >60 mL/min (ref >60)
GFR calc Af Amer: >60 mL/min (ref >60)
GLUCOSE: 164 mg/dL — AB (ref 65–99)
GLUCOSE: 179 mg/dL — AB (ref 65–99)
POTASSIUM: 3.4 mmol/L — AB (ref 3.5–5.1)
Potassium: 3.6 mmol/L (ref 3.5–5.1)
Sodium: 129 mmol/L — ABNORMAL LOW (ref 135–145)
Sodium: 132 mmol/L — ABNORMAL LOW (ref 135–145)

## 2017-12-22 LAB — TROPONIN I
Troponin I: <0.03 ng/mL (ref <0.03)
Troponin I: <0.03 ng/mL (ref <0.03)
Troponin I: <0.03 ng/mL (ref <0.03)

## 2017-12-22 LAB — CBC
HEMATOCRIT: 36.1 % (ref 36.0–46.0)
HEMOGLOBIN: 12.4 g/dL (ref 12.0–15.0)
MCH: 31.2 pg (ref 26.0–34.0)
MCHC: 34.3 g/dL (ref 30.0–36.0)
MCV: 90.9 fL (ref 78.0–100.0)
Platelets: 299 10*3/uL (ref 150–400)
RBC: 3.97 MIL/uL (ref 3.87–5.11)
RDW: 12.7 % (ref 11.5–15.5)
WBC: 9.3 10*3/uL (ref 4.0–10.5)

## 2017-12-22 LAB — CBG MONITORING, ED
GLUCOSE-CAPILLARY: 190 mg/dL — AB (ref 65–99)
Glucose-Capillary: 130 mg/dL — ABNORMAL HIGH (ref 65–99)
Glucose-Capillary: 115 mg/dL — ABNORMAL HIGH (ref 65–99)

## 2017-12-22 LAB — GLUCOSE, CAPILLARY: Glucose-Capillary: 231 mg/dL — ABNORMAL HIGH (ref 65–99)

## 2017-12-22 MED ORDER — LISINOPRIL 10 MG PO TABS
20.0000 mg | ORAL_TABLET | Freq: Every day | ORAL | Status: DC
  Administered 2017-12-22 – 2017-12-23 (×2): 20 mg via ORAL
  Filled 2017-12-22: qty 1
  Filled 2017-12-22: qty 2

## 2017-12-22 MED ORDER — ENOXAPARIN SODIUM 40 MG/0.4ML ~~LOC~~ SOLN
40.0000 mg | SUBCUTANEOUS | Status: DC
  Administered 2017-12-22 – 2017-12-23 (×2): 40 mg via SUBCUTANEOUS
  Filled 2017-12-22 (×2): qty 0.4

## 2017-12-22 MED ORDER — NITROGLYCERIN 0.4 MG SL SUBL: 0.4000 mg | SUBLINGUAL_TABLET | SUBLINGUAL | Status: DC | PRN

## 2017-12-22 MED ORDER — ONDANSETRON HCL 4 MG/2ML IJ SOLN: 4.0000 mg | Freq: Four times a day (QID) | INTRAMUSCULAR | Status: DC | PRN

## 2017-12-22 MED ORDER — CALCIUM CARBONATE-VITAMIN D 500-200 MG-UNIT PO TABS
1.0000 | ORAL_TABLET | Freq: Every morning | ORAL | Status: DC
  Administered 2017-12-22 – 2017-12-23 (×2): 1 via ORAL
  Filled 2017-12-22 (×2): qty 1

## 2017-12-22 MED ORDER — ASPIRIN EC 81 MG PO TBEC
81.0000 mg | DELAYED_RELEASE_TABLET | Freq: Every day | ORAL | Status: DC
  Administered 2017-12-22 – 2017-12-23 (×2): 81 mg via ORAL
  Filled 2017-12-22 (×2): qty 1

## 2017-12-22 MED ORDER — MIRABEGRON ER 25 MG PO TB24
25.0000 mg | ORAL_TABLET | Freq: Every day | ORAL | Status: DC
  Administered 2017-12-22 – 2017-12-23 (×2): 25 mg via ORAL
  Filled 2017-12-22 (×2): qty 1

## 2017-12-22 MED ORDER — HYDRALAZINE HCL 20 MG/ML IJ SOLN: 5.0000 mg | INTRAMUSCULAR | Status: DC | PRN

## 2017-12-22 MED ORDER — PRAVASTATIN SODIUM 40 MG PO TABS
40.0000 mg | ORAL_TABLET | Freq: Every day | ORAL | Status: DC
  Administered 2017-12-22 – 2017-12-23 (×2): 40 mg via ORAL
  Filled 2017-12-22 (×2): qty 1

## 2017-12-22 MED ORDER — POTASSIUM CHLORIDE CRYS ER 20 MEQ PO TBCR
40.0000 meq | EXTENDED_RELEASE_TABLET | ORAL | Status: AC
  Administered 2017-12-22 (×2): 40 meq via ORAL
  Filled 2017-12-22 (×2): qty 2

## 2017-12-22 MED ORDER — DILTIAZEM HCL ER COATED BEADS 180 MG PO CP24
180.0000 mg | ORAL_CAPSULE | Freq: Every day | ORAL | Status: DC
  Administered 2017-12-22 – 2017-12-23 (×2): 180 mg via ORAL
  Filled 2017-12-22 (×2): qty 1

## 2017-12-22 MED ORDER — OMEGA-3-ACID ETHYL ESTERS 1 G PO CAPS
1.0000 g | ORAL_CAPSULE | Freq: Every day | ORAL | Status: DC
  Administered 2017-12-22 – 2017-12-23 (×2): 1 g via ORAL
  Filled 2017-12-22 (×2): qty 1

## 2017-12-22 MED ORDER — ACETAMINOPHEN 325 MG PO TABS
650.0000 mg | ORAL_TABLET | Freq: Four times a day (QID) | ORAL | Status: DC | PRN
  Administered 2017-12-22 – 2017-12-23 (×2): 650 mg via ORAL
  Filled 2017-12-22 (×2): qty 2

## 2017-12-22 MED ORDER — TRAMADOL HCL 50 MG PO TABS
50.0000 mg | ORAL_TABLET | Freq: Four times a day (QID) | ORAL | Status: DC | PRN
  Administered 2017-12-22: 50 mg via ORAL
  Filled 2017-12-22: qty 1

## 2017-12-22 MED ORDER — ACETAMINOPHEN 650 MG RE SUPP: 650.0000 mg | Freq: Four times a day (QID) | RECTAL | Status: DC | PRN

## 2017-12-22 MED ORDER — PANTOPRAZOLE SODIUM 40 MG PO TBEC
40.0000 mg | DELAYED_RELEASE_TABLET | Freq: Every day | ORAL | Status: DC
  Administered 2017-12-22 – 2017-12-23 (×2): 40 mg via ORAL
  Filled 2017-12-22 (×2): qty 1

## 2017-12-22 MED ORDER — LISINOPRIL-HYDROCHLOROTHIAZIDE 10-12.5 MG PO TABS: 2.0000 | ORAL_TABLET | Freq: Every day | ORAL | Status: DC

## 2017-12-22 MED ORDER — POTASSIUM CHLORIDE CRYS ER 10 MEQ PO TBCR
10.0000 meq | EXTENDED_RELEASE_TABLET | Freq: Every day | ORAL | Status: DC
  Administered 2017-12-22 – 2017-12-23 (×2): 10 meq via ORAL
  Filled 2017-12-22 (×2): qty 1

## 2017-12-22 MED ORDER — ONDANSETRON HCL 4 MG PO TABS: 4.0000 mg | ORAL_TABLET | Freq: Four times a day (QID) | ORAL | Status: DC | PRN

## 2017-12-22 MED ORDER — ASPIRIN 81 MG PO CHEW
324.0000 mg | CHEWABLE_TABLET | Freq: Once | ORAL | Status: AC
  Administered 2017-12-22: 324 mg via ORAL
  Filled 2017-12-22: qty 4

## 2017-12-22 MED ORDER — INSULIN ASPART 100 UNIT/ML ~~LOC~~ SOLN
0.0000 [IU] | Freq: Three times a day (TID) | SUBCUTANEOUS | Status: DC
  Administered 2017-12-22: 2 [IU] via SUBCUTANEOUS
  Administered 2017-12-22 – 2017-12-23 (×2): 1 [IU] via SUBCUTANEOUS
  Administered 2017-12-23 (×2): 2 [IU] via SUBCUTANEOUS

## 2017-12-22 NOTE — ED Notes (Signed)
Pt's CBG result was 190. Informed Justin - RN.

## 2017-12-22 NOTE — Plan of Care (Signed)
  Problem: Education: Goal: Knowledge of General Education information will improve Outcome: Progressing   Problem: Health Behavior/Discharge Planning: Goal: Ability to manage health-related needs will improve Outcome: Progressing   Problem: Clinical Measurements: Goal: Ability to maintain clinical measurements within normal limits will improve Outcome: Progressing Goal: Respiratory complications will improve Outcome: Progressing

## 2017-12-22 NOTE — ED Notes (Signed)
Carbmod/thin liquid ordered

## 2017-12-22 NOTE — ED Notes (Signed)
ED Provider at bedside. 

## 2017-12-22 NOTE — Progress Notes (Signed)
Patient is an 84 year old female with history of Hypertension, HL, GERD, esophageal stricture, GERD, DM, cataract admitted with fall and chest pain. Work up so far is non revealing. Cardiology input is appreciated. Will pursue adequate pain control, and complete cardiac work up as directed by Cardiology

## 2017-12-22 NOTE — ED Notes (Signed)
Per patient request paged MD concerning food.

## 2017-12-22 NOTE — ED Notes (Signed)
Purewick placed on pt,

## 2017-12-22 NOTE — Progress Notes (Signed)
Patient admitted from ED overflow 5C. Alert and oriented to room and surroundings. Family at bedside. Telemetry called and verified.

## 2017-12-22 NOTE — ED Notes (Addendum)
Attempted report 

## 2017-12-22 NOTE — H&P (Addendum)
History and Physical    Debra Phillips ZOX:[id number] DOB: [birth year] DOA: 12/21/2017  PCP: Chipper Herb, MD  Patient coming from: Home.  Chief Complaint: Chest pain.  HPI: Debra Phillips is a 84 y.o. female with history of hypertension, diabetes mellitus, hyperlipidemia presents to the ER with complaints of fall and generalized body ache.  Patient states she was trying to feed the squirrels when she lost balance and fell.  She does not know exactly what precipitated the fall but did not lose consciousness did hit her head and had a small scalp hematoma on occipital area.  After the fall she called her daughter through her cell phone.  Later her daughter came to the house by then patient was sitting on the couch and alerted the EMS.  ED Course: In the ER patient was initially complaining of generalized body ache and was given fentanyl.  CT head and C-spine and x-rays done did not show any acute except for scalp hematoma.  Subsequently patient started developing chest pressure substernal after admission.  Patient states over the last couple of days patient also has been having exertional shortness of breath.  Chest pain improved with the phentermine which was given for the body aches.  Admitted for further management.  EKG shows normal sinus rhythm left axis deviation troponin was negative.  Review of Systems: As per HPI, rest all negative.   Past Medical History:  Diagnosis Date  . Abnormality of gait 01/29/2016  . Aortic root dilatation (HCC)    Mild  . Cataract   . Diabetes mellitus without complication ([place])    type 2  . Dyslipidemia   . Dyspnea    with exertion  . Dysrhythmia    hx of occ palpitations  . Esophageal stricture    s/p dilation  . GERD (gastroesophageal reflux disease)   . Headache   . History of hiatal hernia   . Hyperlipidemia   . Hypertension   . Macular pucker, left eye   . Osteoarthritis   . Osteoporosis   . Tinnitus    left ear last week or so      Past Surgical History:  Procedure Laterality Date  . ABDOMINAL HYSTERECTOMY     partial  . APPENDECTOMY    . BACK SURGERY     lower x 1  . CARDIAC CATHETERIZATION  2010   both cataracts  . CHOLECYSTECTOMY  1993  . CYSTOSCOPY N/A 08/28/2016   Procedure: CYSTOSCOPY;  Surgeon: Carolan Clines, MD;  Location: [facility];  Service: Urology;  Laterality: N/A;  . EYE SURGERY     cataracts  . EYE SURGERY     for macular pucker  . FRACTURE SURGERY     rt wrist2010  . HARDWARE REMOVAL  12/10/2011   Procedure: HARDWARE REMOVAL;  Surgeon: Schuyler Amor, MD;  Location: [facility];  Service: Orthopedics;  Laterality: Right;  radial head  . KNEE ARTHROSCOPY  2004   Left and right  . KYPHOSIS SURGERY    . LUMBAR FUSION  [id number]  . RADIAL HEAD ARTHROPLASTY  5/12   orif rt radial head  . SHOULDER SURGERY     Right rotator cuff epair  . TOTAL KNEE ARTHROPLASTY     Left  . VAGINAL PROLAPSE REPAIR N/A 08/28/2016   Procedure: ANTERIOR VAGINAL VAULT SUSPENSION WITH SACROSPINOUS FIXATION;  Surgeon: Carolan Clines, MD;  Location: [facility];  Service: Urology;  Laterality: N/A;     reports that she has  never smoked. She has never used smokeless tobacco. She reports that she does not drink alcohol or use drugs.  Allergies  Allergen Reactions  . Codeine Nausea And Vomiting  . Doxycycline Hives  . Other Nausea Only    Almost all antibiotics  . Sulfonamide Derivatives Hives  . Vancomycin Itching    Family History  Problem Relation Age of Onset  . Pneumonia Mother   . Brain cancer Father   . Tremor Father   . Arthritis Sister   . COPD Sister   . Emphysema Brother   . COPD Brother   . Tremor Brother   . Lung cancer Brother   . Heart disease Brother   . Liver cancer Brother   . Diabetes Other         1 child has diabetes at age 14 and the other 65 are healthy    Prior to Admission medications   Medication Sig Start Date End Date Taking? Authorizing Provider   acetaminophen (TYLENOL) 650 MG CR tablet Take 1,300 mg by mouth every 8 (eight) hours as needed for pain.   Yes [provider]  aspirin EC 81 MG tablet Take 81 mg by mouth daily.   Yes [provider]  Calcium Carb-Cholecalciferol 600-500 MG-UNIT CAPS Take 1 capsule by mouth every morning.   Yes [provider]  cephALEXin (KEFLEX) 250 MG capsule Take 1 capsule by mouth at bedtime. 11/19/17  Yes [provider]  cetirizine (ZYRTEC) 5 MG tablet TAKE 1 TABLET DAILY Patient taking differently: TAKE 1 TABLET DAILY AS NEEDED FOR ALLERGIES 08/26/17  Yes Chipper Herb, MD  Cholecalciferol 1000 UNITS tablet Take 1,000 Units by mouth daily.    Yes [provider]  CINNAMON PO Take 1 tablet by mouth daily.   Yes [provider]  Coenzyme Q10 (COQ10 PO) Take 1 tablet by mouth daily.   Yes [provider]  diltiazem (CARDIZEM CD) 180 MG 24 hr capsule TAKE (1) CAPSULE DAILY 03/30/17  Yes Chipper Herb, MD  ESTRACE VAGINAL 0.1 MG/GM vaginal cream Place 1 Applicatorful vaginally once a week.  12/25/15  Yes [provider]  furosemide (LASIX) 20 MG tablet Take 20 mg by mouth daily as needed for fluid or edema.  08/01/16  Yes [provider]  hydroxypropyl methylcellulose / hypromellose (ISOPTO TEARS / GONIOVISC) 2.5 % ophthalmic solution Place 1 drop into both eyes 3 (three) times daily as needed for dry eyes.   Yes [provider]  lisinopril-hydrochlorothiazide (PRINZIDE,ZESTORETIC) 10-12.5 MG tablet TAKE 2 TABLETS DAILY FOR BLOOD PRESSURE 12/01/17  Yes Chipper Herb, MD  metFORMIN (GLUCOPHAGE) 500 MG tablet TAKE (1) TABLET TWICE A DAY. 12/01/17  Yes Chipper Herb, MD  Multiple Vitamins-Minerals (ICAPS AREDS 2 PO) Take 1 capsule by mouth daily.    Yes [provider]  MYRBETRIQ 25 MG TB24 tablet Take 1 tablet (25 mg total) by mouth daily. 12/01/17  Yes Chipper Herb, MD  nitroGLYCERIN (NITROSTAT) 0.4 MG SL tablet  Place 1 tablet (0.4 mg total) under the tongue every 5 (five) minutes as needed for chest pain. 11/02/15  Yes Minus Breeding, MD  Omega-3 Fatty Acids (FISH OIL) 1000 MG CAPS Take 1,000 mg by mouth every morning.    Yes [provider]  omeprazole (PRILOSEC) 40 MG capsule Take 1 capsule (40 mg total) by mouth 2 (two) times daily. Patient taking differently: Take 40 mg by mouth every morning.  12/21/17  Yes Sharion Balloon, FNP  ONE TOUCH ULTRA TEST test strip CHECK BLOOD SUGAR ONCE A DAY 12/18/16  Yes Chipper Herb, MD  potassium chloride (K-DUR) 10 MEQ tablet TAKE (1) TABLET DAILY AS DIRECTED. Patient taking differently: TAKE (1) TABLET DAILY 12/08/16  Yes Chipper Herb, MD  pravastatin (PRAVACHOL) 40 MG tablet Take 1 tablet (40 mg total) by mouth daily. 12/01/17  Yes Chipper Herb, MD  traMADol (ULTRAM) 50 MG tablet Take 50 mg by mouth every 6 (six) hours as needed for moderate pain.    Yes [provider]  TURMERIC PO Take 1 tablet by mouth daily.   Yes [provider]    Physical Exam: Vitals:   12/22/17 0230 12/22/17 0245 12/22/17 0300 12/22/17 0315  BP: (!) 156/97 (!) 148/83 (!) 144/87 (!) 149/80  Pulse: 81 84 82 84  Resp: 16 17 (!) 21 13  Temp:      TempSrc:      SpO2: 99% 98% 95% 97%  Height:          Constitutional: Moderately built and nourished. Vitals:   12/22/17 0230 12/22/17 0245 12/22/17 0300 12/22/17 0315  BP: (!) 156/97 (!) 148/83 (!) 144/87 (!) 149/80  Pulse: 81 84 82 84  Resp: 16 17 (!) 21 13  Temp:      TempSrc:      SpO2: 99% 98% 95% 97%  Height:       Eyes: Anicteric no pallor. ENMT: No discharge from the ears eyes nose or mouth.  Scalp hematoma. Neck: No mass palpated no neck rigidity. Respiratory: No rhonchi or crepitations. Cardiovascular: S1-S2 heard no murmurs appreciated. Abdomen: Soft nontender bowel sounds present. Musculoskeletal: No edema.  No joint effusion. Skin: No rash.  Skin appears warm. Neurologic: Alert  awake oriented to time place and person.  Moves all extremities 5 x 5.  No facial asymmetry tongue is benign. Psychiatric: Appears normal.  Normal affect.   Labs on Admission: I have personally reviewed following labs and imaging studies  CBC: Recent Labs  Lab 12/21/17 2338  WBC 12.0*  NEUTROABS 8.8*  HGB 12.8  HCT 37.5  MCV 91.7  PLT 540   Basic Metabolic Panel: Recent Labs  Lab 12/21/17 2338  NA 129*  K 3.6  CL 93*  CO2 24  GLUCOSE 179*  BUN 9  CREATININE 0.78  CALCIUM 9.1   GFR: CrCl cannot be calculated (Unknown ideal weight.). Liver Function Tests: No results for input(s): AST, ALT, ALKPHOS, BILITOT, PROT, ALBUMIN in the last 168 hours. No results for input(s): LIPASE, AMYLASE in the last 168 hours. No results for input(s): AMMONIA in the last 168 hours. Coagulation Profile: No results for input(s): INR, PROTIME in the last 168 hours. Cardiac Enzymes: Recent Labs  Lab 12/21/17 2338  TROPONINI <0.03   BNP (last 3 results) No results for input(s): PROBNP in the last 8760 hours. HbA1C: No results for input(s): HGBA1C in the last 72 hours. CBG: No results for input(s): GLUCAP in the last 168 hours. Lipid Profile: No results for input(s): CHOL, HDL, LDLCALC, TRIG, CHOLHDL, LDLDIRECT in the last 72 hours. Thyroid Function Tests: No results for input(s): TSH, T4TOTAL, FREET4, T3FREE, THYROIDAB in the last 72 hours. Anemia Panel: No results for input(s): VITAMINB12, FOLATE, FERRITIN, TIBC, IRON, RETICCTPCT in the last 72 hours. Urine analysis:    Component Value Date/Time   COLORURINE STRAW (A) 12/22/2017 0053   APPEARANCEUR CLEAR 12/22/2017 0053   APPEARANCEUR Clear 08/31/2017 1522   LABSPEC 1.008 12/22/2017 0053  PHURINE 7.0 12/22/2017 0053   GLUCOSEU NEGATIVE 12/22/2017 0053   HGBUR NEGATIVE 12/22/2017 0053   BILIRUBINUR NEGATIVE 12/22/2017 0053   BILIRUBINUR Negative 08/31/2017 1522   [place] 12/22/2017 0053   PROTEINUR NEGATIVE  12/22/2017 0053   UROBILINOGEN negative 08/30/2015 1155   UROBILINOGEN 0.2 03/18/2014 1702   NITRITE NEGATIVE 12/22/2017 0053   LEUKOCYTESUR NEGATIVE 12/22/2017 0053   LEUKOCYTESUR Negative 08/31/2017 1522   Sepsis Labs: @LABRCNTIP (procalcitonin:4,lacticidven:4) )No results found for this or any previous visit (from the past 240 hour(s)).   Radiological Exams on Admission: Dg Chest 1 View  Result Date: 12/22/2017 CLINICAL DATA:  [place] tonight.  Pain in the neck and head. EXAM: CHEST  1 VIEW COMPARISON:  06/13/2016 FINDINGS: Cardiac enlargement. No vascular congestion or edema. No blunting of costophrenic angles. No pneumothorax. Calcified and tortuous aorta. Surgical clips in the right upper quadrant. Previous kyphoplasty changes in the lumbar spine. IMPRESSION: Cardiac enlargement. No evidence of active pulmonary disease. Aortic atherosclerosis. Electronically Signed   By: Lucienne Capers M.D.   On: 12/22/2017 00:32   Dg Lumbar Spine Complete  Result Date: 12/22/2017 CLINICAL DATA:  Fall with back pain EXAM: LUMBAR SPINE - COMPLETE 4+ VIEW COMPARISON:  07/29/2017 FINDINGS: Surgical clips in the right upper quadrant. Bilateral SI joint sclerosis, left greater than right. Posterior stabilization rod and fixating screws at L4-L5 with interbody device. 9 mm anterolisthesis of L4 on L5, no change. Treated compression deformity at L2, no change. Mild degenerative changes at L5-S1 and at the lower thoracic spine. IMPRESSION: 1. No definite acute osseous abnormality 2. Postsurgical changes at L4-L5 with evidence of treated compression deformity at L2. Electronically Signed   By: Donavan Foil M.D.   On: 12/22/2017 00:34   Ct Head Wo Contrast  Result Date: 12/22/2017 CLINICAL DATA:  [place] in kitchen hematoma to the posterior head and neck EXAM: CT HEAD WITHOUT CONTRAST CT CERVICAL SPINE WITHOUT CONTRAST TECHNIQUE: Multidetector CT imaging of the head and cervical spine was performed following the  standard protocol without intravenous contrast. Multiplanar CT image reconstructions of the cervical spine were also generated. COMPARISON:  CT brain 02/06/2016, radiograph 01/14/2016 FINDINGS: CT HEAD FINDINGS Brain: No acute territorial infarction, hemorrhage or intracranial mass is visualized. Mild to moderate atrophy. Minimal small vessel ischemic changes of the white matter. Stable ventricle size. Vascular: No hyperdense vessels. Minimal carotid vascular calcification. Skull: No fracture Sinuses/Orbits: No acute finding. Other: Small left posterior parietal scalp hematoma CT CERVICAL SPINE FINDINGS Alignment: No subluxation.  Facet alignment within normal limits. Skull base and vertebrae: No acute fracture. No primary bone lesion or focal pathologic process. Soft tissues and spinal canal: No prevertebral fluid or swelling. No visible canal hematoma. Disc levels: Mild degenerative changes at C5-C6, moderate degenerative changes at C6-C7. Upper chest: Lung apices are clear. Multiple hypodense thyroid nodules measuring up to 13 mm. Other: None IMPRESSION: 1. No CT evidence for acute intracranial abnormality. Atrophy with minimal small vessel ischemic changes of the white matter. Small left posterior parietal scalp hematoma 2. No acute osseous abnormality of the cervical spine 3. Multiple hypodense thyroid nodules. Electronically Signed   By: Donavan Foil M.D.   On: 12/22/2017 00:49   Ct Cervical Spine Wo Contrast  Result Date: 12/22/2017 CLINICAL DATA:  [place] in kitchen hematoma to the posterior head and neck EXAM: CT HEAD WITHOUT CONTRAST CT CERVICAL SPINE WITHOUT CONTRAST TECHNIQUE: Multidetector CT imaging of the head and cervical spine was performed following the standard protocol without intravenous contrast. Multiplanar  CT image reconstructions of the cervical spine were also generated. COMPARISON:  CT brain 02/06/2016, radiograph 01/14/2016 FINDINGS: CT HEAD FINDINGS Brain: No acute territorial  infarction, hemorrhage or intracranial mass is visualized. Mild to moderate atrophy. Minimal small vessel ischemic changes of the white matter. Stable ventricle size. Vascular: No hyperdense vessels. Minimal carotid vascular calcification. Skull: No fracture Sinuses/Orbits: No acute finding. Other: Small left posterior parietal scalp hematoma CT CERVICAL SPINE FINDINGS Alignment: No subluxation.  Facet alignment within normal limits. Skull base and vertebrae: No acute fracture. No primary bone lesion or focal pathologic process. Soft tissues and spinal canal: No prevertebral fluid or swelling. No visible canal hematoma. Disc levels: Mild degenerative changes at C5-C6, moderate degenerative changes at C6-C7. Upper chest: Lung apices are clear. Multiple hypodense thyroid nodules measuring up to 13 mm. Other: None IMPRESSION: 1. No CT evidence for acute intracranial abnormality. Atrophy with minimal small vessel ischemic changes of the white matter. Small left posterior parietal scalp hematoma 2. No acute osseous abnormality of the cervical spine 3. Multiple hypodense thyroid nodules. Electronically Signed   By: Donavan Foil M.D.   On: 12/22/2017 00:49    EKG: Independently reviewed.  Normal sinus rhythm.  Left axis deviation.  Assessment/Plan Principal Problem:   Chest pain Active Problems:   Essential hypertension   Type 2 diabetes mellitus with hyperlipidemia (HCC)   Hyponatremia    1. Chest pain -given the risk factors including diabetes hypertension and age we will cycle cardiac markers to rule out ACS.  Since patient also complained of some shortness of breath exertional and last few days will check d-dimer.  Have ordered 2D echo.  Requested cardiology consult.  Patient will be on aspirin.  PRN nitroglycerin. 2. Hyponatremia likely from dehydration and hydrochlorothiazide -patient states she has not eaten well for last 2 to 3 days.  Will hold hydrochlorothiazide.  Closely follow metabolic  panel. 3. Hypertension uncontrolled -continue lisinopril and Cardizem.  Hydrochlorothiazide on hold secondary to hyponatremia.  Will keep patient on PRN IV hydralazine. 4. Diabetes mellitus type 2 -we will keep patient on sliding scale coverage. 5. Fall with scalp hematoma and still has some unsteadiness -physical therapy consult closely observe. 6. Hyperlipidemia on statins.   DVT prophylaxis: Lovenox. Code Status: Full code. Family Communication: Patient's daughters. Disposition Plan: To be determined. Consults called: Cardiology. Admission status: Observation.   Rise Patience MD [facility] Pager [phone].  If 7PM-7AM, please contact night-coverage [email or web address] Password [email or web address]  12/22/2017, 3:28 AM

## 2017-12-22 NOTE — Consult Note (Addendum)
Cardiology Consultation:   Patient ID: Debra Phillips; [id number]; 1929-03-19   Admit date: 12/21/2017 Date of Consult: 12/22/2017  Primary Care Provider: Chipper Herb, MD Primary Cardiologist: Minus Breeding, MD  Last seen 2017 Primary Electrophysiologist:  NA   Patient Profile:   Debra Phillips is a 84 y.o. female with a hx of diabetes, HTN, HLD, GERD, esophageal stricture and mild aortic root dilatation and no known CAD with neg nuc study 06/2016 who is being seen today for the evaluation of chest pain at the request of Dr. Marthenia Rolling.  History of Present Illness:   Ms. Debra Phillips  a hx of diabetes, HTN, HLD, GERD, esophageal stricture and mild aortic root dilatation and no known CAD with neg nuc study 06/2016-done for pre-op clearance with mild chest pain cleaning the gutters.    Pt presented to ER by EMS last evening after pt had fall and hit her head on kitchen sink.  No prior falls.  She stated she lost her balance and fell.  She did not have LOC per pt today, she prayed not to pass out cause she knew she needed help, but later was able to call family and they came and called EMS.   Xrays and CTS without acute issue except scalp hematoma.  After admit she developed chest pressure- substernal,   This resolved with fentanyl.  Also given ASA.   She also did complain of SOB a few days prior to the fall with exertion.   EKG SR with first degree AV block no acute EKG changes, and improved from 06/17/16   I personally reviewed.    Troponin <0.03 X 2 Na 132, K+ 3.4, Cr 0.70  WBC on arrival 12 now 9.3, Hgb 12.4 U/A clear DDimer elevated at 0.75 PCXR  Cardiac enlargement. No evidence of active pulmonary disease. Aortic atherosclerosis  BP elevated on arrival 160/85, now controlled 121/72  She has hx of chest tightness that occurs with activity and resolves with rest.  Lasts minutes when it occurs.  She also has some DOE, none at rest. She is not sure if chest tightness and DOE occur at  the same time.  She is never sure if it is from her chronic back pain , but she just keeps going.  She believes she just lost her balance last pm and fell.  She is fairly active.      Past Medical History:  Diagnosis Date  . Abnormality of gait 01/29/2016  . Aortic root dilatation (HCC)    Mild  . Cataract   . Diabetes mellitus without complication ([place])    type 2  . Dyslipidemia   . Dyspnea    with exertion  . Dysrhythmia    hx of occ palpitations  . Esophageal stricture    s/p dilation  . GERD (gastroesophageal reflux disease)   . Headache   . History of hiatal hernia   . Hyperlipidemia   . Hypertension   . Macular pucker, left eye   . Osteoarthritis   . Osteoporosis   . Tinnitus    left ear last week or so    Past Surgical History:  Procedure Laterality Date  . ABDOMINAL HYSTERECTOMY     partial  . APPENDECTOMY    . BACK SURGERY     lower x 1  . CARDIAC CATHETERIZATION  2010   both cataracts  . CHOLECYSTECTOMY  1993  . CYSTOSCOPY N/A 08/28/2016   Procedure: CYSTOSCOPY;  Surgeon: Carolan Clines, MD;  Location: [facility];  Service: Urology;  Laterality: N/A;  . EYE SURGERY     cataracts  . EYE SURGERY     for macular pucker  . FRACTURE SURGERY     rt wrist2010  . HARDWARE REMOVAL  12/10/2011   Procedure: HARDWARE REMOVAL;  Surgeon: Schuyler Amor, MD;  Location: [facility];  Service: Orthopedics;  Laterality: Right;  radial head  . KNEE ARTHROSCOPY  2004   Left and right  . KYPHOSIS SURGERY    . LUMBAR FUSION  [id number]  . RADIAL HEAD ARTHROPLASTY  5/12   orif rt radial head  . SHOULDER SURGERY     Right rotator cuff epair  . TOTAL KNEE ARTHROPLASTY     Left  . VAGINAL PROLAPSE REPAIR N/A 08/28/2016   Procedure: ANTERIOR VAGINAL VAULT SUSPENSION WITH SACROSPINOUS FIXATION;  Surgeon: Carolan Clines, MD;  Location: [facility];  Service: Urology;  Laterality: N/A;     Home Medications:  Prior to Admission medications   Medication Sig Start  Date End Date Taking? Authorizing Provider  acetaminophen (TYLENOL) 650 MG CR tablet Take 1,300 mg by mouth every 8 (eight) hours as needed for pain.   Yes [provider]  aspirin EC 81 MG tablet Take 81 mg by mouth daily.   Yes [provider]  Calcium Carb-Cholecalciferol 600-500 MG-UNIT CAPS Take 1 capsule by mouth every morning.   Yes [provider]  cephALEXin (KEFLEX) 250 MG capsule Take 1 capsule by mouth at bedtime. 11/19/17  Yes [provider]  cetirizine (ZYRTEC) 5 MG tablet TAKE 1 TABLET DAILY Patient taking differently: TAKE 1 TABLET DAILY AS NEEDED FOR ALLERGIES 08/26/17  Yes Chipper Herb, MD  Cholecalciferol 1000 UNITS tablet Take 1,000 Units by mouth daily.    Yes [provider]  CINNAMON PO Take 1 tablet by mouth daily.   Yes [provider]  Coenzyme Q10 (COQ10 PO) Take 1 tablet by mouth daily.   Yes [provider]  diltiazem (CARDIZEM CD) 180 MG 24 hr capsule TAKE (1) CAPSULE DAILY 03/30/17  Yes Chipper Herb, MD  ESTRACE VAGINAL 0.1 MG/GM vaginal cream Place 1 Applicatorful vaginally once a week.  12/25/15  Yes [provider]  furosemide (LASIX) 20 MG tablet Take 20 mg by mouth daily as needed for fluid or edema.  08/01/16  Yes [provider]  hydroxypropyl methylcellulose / hypromellose (ISOPTO TEARS / GONIOVISC) 2.5 % ophthalmic solution Place 1 drop into both eyes 3 (three) times daily as needed for dry eyes.   Yes [provider]  lisinopril-hydrochlorothiazide (PRINZIDE,ZESTORETIC) 10-12.5 MG tablet TAKE 2 TABLETS DAILY FOR BLOOD PRESSURE 12/01/17  Yes Chipper Herb, MD  metFORMIN (GLUCOPHAGE) 500 MG tablet TAKE (1) TABLET TWICE A DAY. 12/01/17  Yes Chipper Herb, MD  Multiple Vitamins-Minerals (ICAPS AREDS 2 PO) Take 1 capsule by mouth daily.    Yes [provider]  MYRBETRIQ 25 MG TB24 tablet Take 1 tablet (25 mg total) by mouth daily. 12/01/17  Yes Chipper Herb, MD    nitroGLYCERIN (NITROSTAT) 0.4 MG SL tablet Place 1 tablet (0.4 mg total) under the tongue every 5 (five) minutes as needed for chest pain. 11/02/15  Yes Minus Breeding, MD  Omega-3 Fatty Acids (FISH OIL) 1000 MG CAPS Take 1,000 mg by mouth every morning.    Yes [provider]  omeprazole (PRILOSEC) 40 MG capsule Take 1 capsule (40 mg total) by mouth 2 (two) times daily. Patient taking differently:  Take 40 mg by mouth every morning.  12/21/17  Yes Hawks, Theador Hawthorne, FNP  ONE TOUCH ULTRA TEST test strip CHECK BLOOD SUGAR ONCE A DAY 12/18/16  Yes Chipper Herb, MD  potassium chloride (K-DUR) 10 MEQ tablet TAKE (1) TABLET DAILY AS DIRECTED. Patient taking differently: TAKE (1) TABLET DAILY 12/08/16  Yes Chipper Herb, MD  pravastatin (PRAVACHOL) 40 MG tablet Take 1 tablet (40 mg total) by mouth daily. 12/01/17  Yes Chipper Herb, MD  traMADol (ULTRAM) 50 MG tablet Take 50 mg by mouth every 6 (six) hours as needed for moderate pain.    Yes [provider]  TURMERIC PO Take 1 tablet by mouth daily.   Yes [provider]    Inpatient Medications: Scheduled Meds: . aspirin EC  81 mg Oral Daily  . calcium-vitamin D  1 tablet Oral q morning - 10a  . diltiazem  180 mg Oral Daily  . enoxaparin (LOVENOX) injection  40 mg Subcutaneous Q24H  . insulin aspart  0-9 Units Subcutaneous TID WC  . lisinopril  20 mg Oral Daily  . mirabegron ER  25 mg Oral Daily  . omega-3 acid ethyl esters  1 g Oral Daily  . pantoprazole  40 mg Oral Daily  . potassium chloride  10 mEq Oral Daily  . pravastatin  40 mg Oral q1800   Continuous Infusions:  PRN Meds: acetaminophen **OR** acetaminophen, hydrALAZINE, nitroGLYCERIN, ondansetron **OR** ondansetron (ZOFRAN) IV, traMADol  Allergies:    Allergies  Allergen Reactions  . Codeine Nausea And Vomiting  . Doxycycline Hives  . Other Nausea Only    Almost all antibiotics  . Sulfonamide Derivatives Hives  . Vancomycin Itching    Social  History:   Social History   Socioeconomic History  . Marital status: Widowed    Spouse name: Not on file  . Number of children: 5  . Years of education: 5  . Highest education level: Not on file  Occupational History  . 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 Financial resource strain: Not on file  . Food insecurity:    Worry: Not on file    Inability: Not on file  . Transportation needs:    Medical: Not on file    Non-medical: Not on file  Tobacco Use  . Smoking status: Never Smoker  . Smokeless tobacco: Never Used  Substance and Sexual Activity  . Alcohol use: No  . Drug use: No  . Sexual activity: Never  Lifestyle  . Physical activity:    Days per week: Not on file    Minutes per session: Not on file  . Stress: Not on file  Relationships  . Social connections:    Talks on phone: Not on file    Gets together: Not on file    Attends religious service: Not on file    Active member of club or organization: Not on file    Attends meetings of clubs or organizations: Not on file    Relationship status: Not on file  . Intimate partner violence:    Fear of current or ex partner: Not on file    Emotionally abused: Not on file    Physically abused: Not on file    Forced sexual activity: Not on file  Other Topics Concern  . Not on file  Social History Narrative   Pt lives at home alone   Husband w/ Alzheimer's  passed away   Right-handed   Drinks 1-2 cups of coffee daily    Family History:    Family History  Problem Relation Age of Onset  . Pneumonia Mother   . Brain cancer Father   . Tremor Father   . Arthritis Sister   . COPD Sister   . Emphysema Brother   . COPD Brother   . Tremor Brother   . Lung cancer Brother   . Heart disease Brother   . Liver cancer Brother   . Diabetes Other         1 child has diabetes at age 84 and the other 4 are healthy     ROS:  Please see the history of present illness.    General:no colds or fevers, no weight changes Skin:no rashes or ulcers HEENT:no blurred vision, no congestion CV:see HPI PUL:see HPI GI:no diarrhea constipation or melena, no indigestion GU:no hematuria, no dysuria MS:no joint pain, no claudication Neuro:no syncope, no lightheadedness Endo:+ diabetes, no thyroid disease  All other ROS reviewed and negative.     Physical Exam/Data:   Vitals:   12/22/17 0632 12/22/17 0739 12/22/17 0800 12/22/17 0940  BP: 136/73 134/73 118/67 121/72  Pulse: 83 86 87 78  Resp: 18 17 20 16   Temp:      TempSrc:      SpO2: 100% 96% 99% 100%  Weight:      Height:       No intake or output data in the 24 hours ending 12/22/17 1041 Filed Weights   12/22/17 0615  Weight: 169 lb 1.5 oz (76.7 kg)   Body mass index is 29.95 kg/m.  General:  Well nourished, well developed, in no acute distress  HEENT: normal Lymph: no adenopathy Neck: no JVD Endocrine:  No thryomegaly Vascular: No carotid bruits; pedal pulses 1+ bilaterally Cardiac:  normal S1, S2; RRR; no murmur gallup rub or click Lungs:  clear to auscultation bilaterally, no wheezing, rhonchi or rales  Abd: soft, nontender, no hepatomegaly  Ext: no edema Musculoskeletal:  No deformities, BUE and BLE strength normal and equal Skin: warm and dry  Neuro:  Alert and oriented X 3 MAE follows commands, no focal abnormalities noted Psych:  Normal affect    Relevant CV Studies: Echo 07/08/16 Study Conclusions  - Left ventricle: The cavity size was normal. There was mild focal   basal hypertrophy of the septum. Systolic function was normal.   The estimated ejection fraction was in the range of 55% to 60%.   There is hypokinesis of the inferoseptal myocardium. Doppler   parameters are consistent with abnormal left ventricular   relaxation (grade 1 diastolic dysfunction). - Aortic valve: Trileaflet; mildly thickened, mildly calcified   leaflets. - Aorta: The aorta was mildly calcified. -  Mitral valve: There was mild regurgitation. - Tricuspid valve: There was trivial regurgitation.  Impressions:  - Aortic root is not measured as dilated.  Nuc study 07/08/16 Study Highlights     The left ventricular ejection fraction is normal (55-65%).  Nuclear stress EF: 55%.  There was no ST segment deviation noted during stress.  Defect 1: There is a small defect of mild severity present in the apical septal and apex location.  This is a low risk study.   Low risk stress nuclear study with probable mild shifting breast attenuation; cannot exclude mild apical ischemia; EF 55 with normal wall motion.      Laboratory Data:  Chemistry Recent Labs  Lab 12/21/17 2338 12/22/17  0347  NA 129* 132*  K 3.6 3.4*  CL 93* 96*  CO2 24 24  GLUCOSE 179* 164*  BUN 9 9  CREATININE 0.78 0.70  CALCIUM 9.1 8.9  GFRNONAA >60 >60  GFRAA >60 >60  ANIONGAP 12 12    No results for input(s): PROT, ALBUMIN, AST, ALT, ALKPHOS, BILITOT in the last 168 hours. Hematology Recent Labs  Lab 12/21/17 2338 12/22/17 0347  WBC 12.0* 9.3  RBC 4.09 3.97  HGB 12.8 12.4  HCT 37.5 36.1  MCV 91.7 90.9  MCH 31.3 31.2  MCHC 34.1 34.3  RDW 12.7 12.7  PLT 285 299   Cardiac Enzymes Recent Labs  Lab 12/21/17 2338 12/22/17 0347  TROPONINI <0.03 <0.03   No results for input(s): TROPIPOC in the last 168 hours.  BNPNo results for input(s): BNP, PROBNP in the last 168 hours.  DDimer  Recent Labs  Lab 12/22/17 0442  DDIMER 0.75*    Radiology/Studies:  Dg Chest 1 View  Result Date: 12/22/2017 CLINICAL DATA:  [place] tonight.  Pain in the neck and head. EXAM: CHEST  1 VIEW COMPARISON:  06/13/2016 FINDINGS: Cardiac enlargement. No vascular congestion or edema. No blunting of costophrenic angles. No pneumothorax. Calcified and tortuous aorta. Surgical clips in the right upper quadrant. Previous kyphoplasty changes in the lumbar spine. IMPRESSION: Cardiac enlargement. No evidence of active  pulmonary disease. Aortic atherosclerosis. Electronically Signed   By: Lucienne Capers M.D.   On: 12/22/2017 00:32   Dg Lumbar Spine Complete  Result Date: 12/22/2017 CLINICAL DATA:  Fall with back pain EXAM: LUMBAR SPINE - COMPLETE 4+ VIEW COMPARISON:  07/29/2017 FINDINGS: Surgical clips in the right upper quadrant. Bilateral SI joint sclerosis, left greater than right. Posterior stabilization rod and fixating screws at L4-L5 with interbody device. 9 mm anterolisthesis of L4 on L5, no change. Treated compression deformity at L2, no change. Mild degenerative changes at L5-S1 and at the lower thoracic spine. IMPRESSION: 1. No definite acute osseous abnormality 2. Postsurgical changes at L4-L5 with evidence of treated compression deformity at L2. Electronically Signed   By: Donavan Foil M.D.   On: 12/22/2017 00:34   Ct Head Wo Contrast  Result Date: 12/22/2017 CLINICAL DATA:  [place] in kitchen hematoma to the posterior head and neck EXAM: CT HEAD WITHOUT CONTRAST CT CERVICAL SPINE WITHOUT CONTRAST TECHNIQUE: Multidetector CT imaging of the head and cervical spine was performed following the standard protocol without intravenous contrast. Multiplanar CT image reconstructions of the cervical spine were also generated. COMPARISON:  CT brain 02/06/2016, radiograph 01/14/2016 FINDINGS: CT HEAD FINDINGS Brain: No acute territorial infarction, hemorrhage or intracranial mass is visualized. Mild to moderate atrophy. Minimal small vessel ischemic changes of the white matter. Stable ventricle size. Vascular: No hyperdense vessels. Minimal carotid vascular calcification. Skull: No fracture Sinuses/Orbits: No acute finding. Other: Small left posterior parietal scalp hematoma CT CERVICAL SPINE FINDINGS Alignment: No subluxation.  Facet alignment within normal limits. Skull base and vertebrae: No acute fracture. No primary bone lesion or focal pathologic process. Soft tissues and spinal canal: No prevertebral fluid or  swelling. No visible canal hematoma. Disc levels: Mild degenerative changes at C5-C6, moderate degenerative changes at C6-C7. Upper chest: Lung apices are clear. Multiple hypodense thyroid nodules measuring up to 13 mm. Other: None IMPRESSION: 1. No CT evidence for acute intracranial abnormality. Atrophy with minimal small vessel ischemic changes of the white matter. Small left posterior parietal scalp hematoma 2. No acute osseous abnormality of the cervical spine 3. Multiple hypodense  thyroid nodules. Electronically Signed   By: Donavan Foil M.D.   On: 12/22/2017 00:49   Ct Cervical Spine Wo Contrast  Result Date: 12/22/2017 CLINICAL DATA:  [place] in kitchen hematoma to the posterior head and neck EXAM: CT HEAD WITHOUT CONTRAST CT CERVICAL SPINE WITHOUT CONTRAST TECHNIQUE: Multidetector CT imaging of the head and cervical spine was performed following the standard protocol without intravenous contrast. Multiplanar CT image reconstructions of the cervical spine were also generated. COMPARISON:  CT brain 02/06/2016, radiograph 01/14/2016 FINDINGS: CT HEAD FINDINGS Brain: No acute territorial infarction, hemorrhage or intracranial mass is visualized. Mild to moderate atrophy. Minimal small vessel ischemic changes of the white matter. Stable ventricle size. Vascular: No hyperdense vessels. Minimal carotid vascular calcification. Skull: No fracture Sinuses/Orbits: No acute finding. Other: Small left posterior parietal scalp hematoma CT CERVICAL SPINE FINDINGS Alignment: No subluxation.  Facet alignment within normal limits. Skull base and vertebrae: No acute fracture. No primary bone lesion or focal pathologic process. Soft tissues and spinal canal: No prevertebral fluid or swelling. No visible canal hematoma. Disc levels: Mild degenerative changes at C5-C6, moderate degenerative changes at C6-C7. Upper chest: Lung apices are clear. Multiple hypodense thyroid nodules measuring up to 13 mm. Other: None IMPRESSION: 1.  No CT evidence for acute intracranial abnormality. Atrophy with minimal small vessel ischemic changes of the white matter. Small left posterior parietal scalp hematoma 2. No acute osseous abnormality of the cervical spine 3. Multiple hypodense thyroid nodules. Electronically Signed   By: Donavan Foil M.D.   On: 12/22/2017 00:49    Assessment and Plan:   1. Chest pressure post fall - neg MI, EKG is stable. (multiple risk factors for CAD with DM, HTN, HLD)  Echo is pending, possible lexiscan myoview to rule out ischemia --no further chest pain but she does have it intermittently even before last pm.  Dr. Meda Coffee to see.  2. Cardiomegaly on CXR  With normal heart size in 2017, Echo is pending. Hx of mild aortic dilatation  3. HLD on statin, with TG 164, T chol 162, HDL 53, LDL 76 on pravachol 4. HTN now controlled 5. Hyponatremia - improving 6. DM-2 per IM  7. Fall, mechanical. With scalp hematoma.  8.         Elevated DDimer , ? CT chest vs. VQ   For questions or updates, please contact [place] Please consult [email or web address] for contact info under Cardiology/STEMI.   Signed, Cecilie Kicks, NP  12/22/2017 10:41 AM   The patient was seen, examined and discussed with Cecilie Kicks, NP and I agree with the above.   84 y.o. female with a hx of diabetes, HTN, HLD, GERD, esophageal stricture and mild aortic root dilatation and no known CAD with neg nuc study 06/2016, who was admitted after a mechanical fall with sustained head laceration but no intracranial bleed. The patient developed chest pain post fall, no loss of consciousness, no palpitations. She admits to intermittent exertional chest pain recently associated with DOE.  On physical exam there are no murmur,, no JVD elevation, lings are clear, no LE edema. Troponin is negative x 3, ECG shows SR, non specific ST T wave abnormalities. Telemetry shows NSR.   We will plan for an echocardiogram to evaluate LVEF, WMA, also Lexiscan nuclear   Stress test to rule out ischemia.   Ena Dawley, MD 12/22/2017

## 2017-12-22 NOTE — Evaluation (Signed)
Physical Therapy Evaluation Patient Details Name: Debra Phillips MRN: [id number] DOB: June 21, 1929 Today's Date: 12/22/2017   History of Present Illness  Pt is an 84 y.o. female admitted 12/21/17 post-fall and c/o body aches, including chest pressure. Chest pain improved with phentermine which was given for the body aches; cycling cardiac markers to rule out ACS. Head and C-spine imaging showed no acute abnormality, except scalp hematoma. PMH includes uncontrolled HTN, DM, chronic LBP, lumbar fusion, L TKA, osteoporosis.    Clinical Impression  Pt presents with an overall decrease in functional mobility secondary to above. PTA, pt lives alone and mod indep with intermittent use of SPC; has family nearby available for assist if needed. Pt denies recent falls (in the past 3 months) since the one leading to admission. Today, able to amb in room with RW and min guard for balance; indep with toilet transfer and pericare; limited by fatigue. Pt would benefit from continued acute PT services to maximize functional mobility and independence prior to d/c with HHPT services.     Follow Up Recommendations Home health PT;Supervision - Intermittent    Equipment Recommendations  None recommended by PT    Recommendations for Other Services       Precautions / Restrictions Precautions Precautions: Fall Restrictions Weight Bearing Restrictions: No      Mobility  Bed Mobility Overal bed mobility: Modified Independent             General bed mobility comments: Increased time and effort, but no physical assist required  Transfers Overall transfer level: Needs assistance Equipment used: Rolling walker (2 wheeled) Transfers: Sit to/from Stand Sit to Stand: Min guard         General transfer comment: Stood from bed and BSC (over toilet) with RW and min guard for safety; cues for correct hand placement on RW  Ambulation/Gait Ambulation/Gait assistance: Min guard Ambulation Distance (Feet):  20 Feet Assistive device: Rolling walker (2 wheeled) Gait Pattern/deviations: Step-through pattern;Decreased stride length;Trunk flexed Gait velocity: Decreased Gait velocity interpretation: <1.8 ft/sec, indicate of risk for recurrent falls General Gait Details: Slow, steady amb with RW and min guard for safety; cues to maintain closer proximity to RW. Pt c/o fatigue limiting further amb distance  Stairs            Wheelchair Mobility    Modified Rankin (Stroke Patients Only)       Balance Overall balance assessment: Needs assistance   Sitting balance-Leahy Scale: Good Sitting balance - Comments: Required assist to don socks secondary to c/o LBP; indep with pericare while sitting     Standing balance-Leahy Scale: Fair Standing balance comment: Can static stand with no UE support                             Pertinent Vitals/Pain Pain Assessment: Faces Faces Pain Scale: Hurts little more Pain Location: Back Pain Descriptors / Indicators: Constant;Sore Pain Intervention(s): Monitored during session;Limited activity within patient's tolerance    Home Living Family/patient expects to be discharged to:: Private residence Living Arrangements: Alone Available Help at Discharge: Family;Available 24 hours/day Type of Home: House Home Access: Ramped entrance     Home Layout: Two level;Able to live on main level with bedroom/bathroom Home Equipment: Gilford Rile - 2 wheels;Cane - single point;Bedside commode;Shower seat      Prior Function Level of Independence: Independent with assistive device(s)         Comments: Mod indep with use of SPC  when ambulating outside; does not use DME inside. Daughter lives nearby and provides PRN assist if needed. Pt drives     Hand Dominance        Extremity/Trunk Assessment   Upper Extremity Assessment Upper Extremity Assessment: Overall WFL for tasks assessed    Lower Extremity Assessment Lower Extremity Assessment:  Generalized weakness       Communication   Communication: No difficulties  Cognition Arousal/Alertness: Awake/alert Behavior During Therapy: WFL for tasks assessed/performed Overall Cognitive Status: Within Functional Limits for tasks assessed                                 General Comments: Pt seems fatigued, but overall WFL      General Comments General comments (skin integrity, edema, etc.): SpO2 100% on RA. HR 84. Supine BP 125/67, post-amb BP 121/72    Exercises     Assessment/Plan    PT Assessment Patient needs continued PT services  PT Problem List Decreased strength;Decreased activity tolerance;Decreased balance;Decreased mobility       PT Treatment Interventions DME instruction;Gait training;Stair training;Functional mobility training;Therapeutic activities;Therapeutic exercise;Balance training;Patient/family education    PT Goals (Current goals can be found in the Care Plan section)  Acute Rehab PT Goals Patient Stated Goal: Return home and feel better PT Goal Formulation: With patient Time For Goal Achievement: 01/05/18 Potential to Achieve Goals: Good    Frequency Min 3X/week   Barriers to discharge        Co-evaluation               AM-PAC PT "6 Clicks" Daily Activity  Outcome Measure Difficulty turning over in bed (including adjusting bedclothes, sheets and blankets)?: None Difficulty moving from lying on back to sitting on the side of the bed? : None Difficulty sitting down on and standing up from a chair with arms (e.g., wheelchair, bedside commode, etc,.)?: A Little Help needed moving to and from a bed to chair (including a wheelchair)?: A Little Help needed walking in hospital room?: A Little Help needed climbing 3-5 steps with a railing? : A Little 6 Click Score: 20    End of Session Equipment Utilized During Treatment: Gait belt Activity Tolerance: Patient tolerated treatment well;Patient limited by fatigue Patient  left: in bed;with call bell/phone within reach Nurse Communication: Mobility status PT Visit Diagnosis: Other abnormalities of gait and mobility (R26.89)    Time: 9518-8416 PT Time Calculation (min) (ACUTE ONLY): 29 min   Charges:   PT Evaluation $PT Eval Moderate Complexity: 1 Mod PT Treatments $Therapeutic Activity: 8-22 mins   PT G Codes:       Mabeline Caras, PT, DPT Acute Rehab Services  Pager: [place] 12/22/2017, 10:05 AM

## 2017-12-22 NOTE — ED Notes (Signed)
Admitting MD K at bedside

## 2017-12-23 ENCOUNTER — Observation Stay (HOSPITAL_BASED_OUTPATIENT_CLINIC_OR_DEPARTMENT_OTHER): Payer: Medicare Other

## 2017-12-23 DIAGNOSIS — W19XXXA Unspecified fall, initial encounter: Secondary | ICD-10-CM | POA: Diagnosis not present

## 2017-12-23 DIAGNOSIS — R079 Chest pain, unspecified: Secondary | ICD-10-CM

## 2017-12-23 DIAGNOSIS — R52 Pain, unspecified: Secondary | ICD-10-CM | POA: Diagnosis not present

## 2017-12-23 DIAGNOSIS — I10 Essential (primary) hypertension: Secondary | ICD-10-CM | POA: Diagnosis not present

## 2017-12-23 DIAGNOSIS — R072 Precordial pain: Secondary | ICD-10-CM

## 2017-12-23 DIAGNOSIS — S060X1A Concussion with loss of consciousness of 30 minutes or less, initial encounter: Secondary | ICD-10-CM | POA: Diagnosis not present

## 2017-12-23 DIAGNOSIS — E871 Hypo-osmolality and hyponatremia: Secondary | ICD-10-CM | POA: Diagnosis not present

## 2017-12-23 LAB — RENAL FUNCTION PANEL
Albumin: 3 g/dL — ABNORMAL LOW (ref 3.5–5.0)
Anion gap: 8 (ref 5–15)
BUN: 11 mg/dL (ref 6–20)
CO2: 28 mmol/L (ref 22–32)
Calcium: 8.9 mg/dL (ref 8.9–10.3)
Chloride: 96 mmol/L — ABNORMAL LOW (ref 101–111)
Creatinine, Ser: 0.84 mg/dL (ref 0.44–1.00)
GFR calc Af Amer: >60 mL/min (ref >60)
GFR calc non Af Amer: >60 mL/min (ref >60)
Glucose, Bld: 180 mg/dL — ABNORMAL HIGH (ref 65–99)
Phosphorus: 3.4 mg/dL (ref 2.5–4.6)
Potassium: 4.3 mmol/L (ref 3.5–5.1)
Sodium: 132 mmol/L — ABNORMAL LOW (ref 135–145)

## 2017-12-23 LAB — NM MYOCAR MULTI W/SPECT W/WALL MOTION / EF
Peak HR: 96 {beats}/min
Rest HR: 62 {beats}/min

## 2017-12-23 LAB — GLUCOSE, CAPILLARY
GLUCOSE-CAPILLARY: 155 mg/dL — AB (ref 65–99)
GLUCOSE-CAPILLARY: 177 mg/dL — AB (ref 65–99)
Glucose-Capillary: 136 mg/dL — ABNORMAL HIGH (ref 65–99)

## 2017-12-23 LAB — ECHOCARDIOGRAM COMPLETE
Height: 63 in
Weight: 2698.43 oz

## 2017-12-23 LAB — MAGNESIUM: Magnesium: 1.7 mg/dL (ref 1.7–2.4)

## 2017-12-23 MED ORDER — TECHNETIUM TC 99M TETROFOSMIN IV KIT
10.0000 | PACK | Freq: Once | INTRAVENOUS | Status: AC | PRN
  Administered 2017-12-23: 10 via INTRAVENOUS

## 2017-12-23 MED ORDER — TECHNETIUM TC 99M TETROFOSMIN IV KIT
30.0000 | PACK | Freq: Once | INTRAVENOUS | Status: AC | PRN
  Administered 2017-12-23: 30 via INTRAVENOUS

## 2017-12-23 MED ORDER — LISINOPRIL 40 MG PO TABS: 40.0000 mg | ORAL_TABLET | Freq: Every day | ORAL | 0 refills | Status: DC

## 2017-12-23 MED ORDER — REGADENOSON 0.4 MG/5ML IV SOLN
0.4000 mg | Freq: Once | INTRAVENOUS | Status: AC
  Administered 2017-12-23: 0.4 mg via INTRAVENOUS
  Filled 2017-12-23: qty 5

## 2017-12-23 MED ORDER — REGADENOSON 0.4 MG/5ML IV SOLN
INTRAVENOUS | Status: AC
  Filled 2017-12-23: qty 5

## 2017-12-23 NOTE — Progress Notes (Signed)
PT Cancellation Note  Patient Details Name: Debra Phillips MRN: [id number] DOB: [birth year]   Cancelled Treatment:    Reason Eval/Treat Not Completed: Patient at procedure or test/unavailable. Pt down for stress test.   Shary Decamp [facility] 12/23/2017, 10:45 AM Suanne Marker PT [phone]

## 2017-12-23 NOTE — Progress Notes (Signed)
  Echocardiogram 2D Echocardiogram has been performed.  Debra Phillips 12/23/2017, 3:07 PM

## 2017-12-23 NOTE — Progress Notes (Addendum)
Progress Note  Patient Name: Debra Phillips Date of Encounter: 12/23/2017  Primary Cardiologist: Dr. Percival Spanish 2017  Subjective   Feeling well without CP today.   Inpatient Medications    Scheduled Meds: . aspirin EC  81 mg Oral Daily  . calcium-vitamin D  1 tablet Oral q morning - 10a  . diltiazem  180 mg Oral Daily  . enoxaparin (LOVENOX) injection  40 mg Subcutaneous Q24H  . insulin aspart  0-9 Units Subcutaneous TID WC  . lisinopril  20 mg Oral Daily  . mirabegron ER  25 mg Oral Daily  . omega-3 acid ethyl esters  1 g Oral Daily  . pantoprazole  40 mg Oral Daily  . potassium chloride  10 mEq Oral Daily  . pravastatin  40 mg Oral q1800  . regadenoson      . regadenoson  0.4 mg Intravenous Once   Continuous Infusions:  PRN Meds: acetaminophen **OR** acetaminophen, hydrALAZINE, nitroGLYCERIN, ondansetron **OR** ondansetron (ZOFRAN) IV   Vital Signs    Vitals:   12/22/17 1900 12/22/17 2020 12/22/17 2352 12/23/17 0402  BP: (!) 149/77 (!) 128/58 124/66 133/61  Pulse: 77  68 68  Resp: 19 14 15  (!) 22  Temp:  98.7 F (37.1 C) 97.7 F (36.5 C) (!) 96.7 F (35.9 C)  TempSrc:  Oral Axillary Axillary  SpO2: 99% 98% 96% 99%  Weight:  168 lb 10.4 oz (76.5 kg)    Height:  5\' 3"  (1.6 m)      Intake/Output Summary (Last 24 hours) at 12/23/2017 0954 Last data filed at 12/23/2017 0629 Gross per 24 hour  Intake -  Output 650 ml  Net -650 ml   Filed Weights   12/22/17 0615 12/22/17 2020  Weight: 169 lb 1.5 oz (76.7 kg) 168 lb 10.4 oz (76.5 kg)    Telemetry    NSR 1st degree AVB - Personally Reviewed  Physical Exam   GEN: No acute distress.  HEENT: Normocephalic, atraumatic, sclera non-icteric. Neck: No JVD or bruits. Cardiac: RRR no murmurs, rubs, or gallops.  Radials/DP/PT 1+ and equal bilaterally.  Respiratory: Clear to auscultation bilaterally. Breathing is unlabored. GI: Soft, nontender, non-distended, BS +x 4. MS: no deformity. Extremities: No clubbing  or cyanosis. No edema. Distal pedal pulses are 2+ and equal bilaterally. Neuro:  AAOx3. Follows commands. Psych:  Responds to questions appropriately with a normal affect.  Labs    Chemistry Recent Labs  Lab 12/21/17 2338 12/22/17 0347 12/23/17 0252  NA 129* 132* 132*  K 3.6 3.4* 4.3  CL 93* 96* 96*  CO2 24 24 28   GLUCOSE 179* 164* 180*  BUN 9 9 11   CREATININE 0.78 0.70 0.84  CALCIUM 9.1 8.9 8.9  ALBUMIN  --   --  3.0*  GFRNONAA >60 >60 >60  GFRAA >60 >60 >60  ANIONGAP 12 12 8      Hematology Recent Labs  Lab 12/21/17 2338 12/22/17 0347  WBC 12.0* 9.3  RBC 4.09 3.97  HGB 12.8 12.4  HCT 37.5 36.1  MCV 91.7 90.9  MCH 31.3 31.2  MCHC 34.1 34.3  RDW 12.7 12.7  PLT 285 299    Cardiac Enzymes Recent Labs  Lab 12/21/17 2338 12/22/17 0347 12/22/17 1445 12/22/17 2023  TROPONINI <0.03 <0.03 <0.03 <0.03   No results for input(s): TROPIPOC in the last 168 hours.   BNPNo results for input(s): BNP, PROBNP in the last 168 hours.   DDimer  Recent Labs  Lab 12/22/17 0442  DDIMER 0.75*  Radiology    Dg Chest 1 View  Result Date: 12/22/2017 CLINICAL DATA:  [place] tonight.  Pain in the neck and head. EXAM: CHEST  1 VIEW COMPARISON:  06/13/2016 FINDINGS: Cardiac enlargement. No vascular congestion or edema. No blunting of costophrenic angles. No pneumothorax. Calcified and tortuous aorta. Surgical clips in the right upper quadrant. Previous kyphoplasty changes in the lumbar spine. IMPRESSION: Cardiac enlargement. No evidence of active pulmonary disease. Aortic atherosclerosis. Electronically Signed   By: Lucienne Capers M.D.   On: 12/22/2017 00:32   Dg Lumbar Spine Complete  Result Date: 12/22/2017 CLINICAL DATA:  Fall with back pain EXAM: LUMBAR SPINE - COMPLETE 4+ VIEW COMPARISON:  07/29/2017 FINDINGS: Surgical clips in the right upper quadrant. Bilateral SI joint sclerosis, left greater than right. Posterior stabilization rod and fixating screws at L4-L5 with  interbody device. 9 mm anterolisthesis of L4 on L5, no change. Treated compression deformity at L2, no change. Mild degenerative changes at L5-S1 and at the lower thoracic spine. IMPRESSION: 1. No definite acute osseous abnormality 2. Postsurgical changes at L4-L5 with evidence of treated compression deformity at L2. Electronically Signed   By: Donavan Foil M.D.   On: 12/22/2017 00:34   Ct Head Wo Contrast  Result Date: 12/22/2017 CLINICAL DATA:  [place] in kitchen hematoma to the posterior head and neck EXAM: CT HEAD WITHOUT CONTRAST CT CERVICAL SPINE WITHOUT CONTRAST TECHNIQUE: Multidetector CT imaging of the head and cervical spine was performed following the standard protocol without intravenous contrast. Multiplanar CT image reconstructions of the cervical spine were also generated. COMPARISON:  CT brain 02/06/2016, radiograph 01/14/2016 FINDINGS: CT HEAD FINDINGS Brain: No acute territorial infarction, hemorrhage or intracranial mass is visualized. Mild to moderate atrophy. Minimal small vessel ischemic changes of the white matter. Stable ventricle size. Vascular: No hyperdense vessels. Minimal carotid vascular calcification. Skull: No fracture Sinuses/Orbits: No acute finding. Other: Small left posterior parietal scalp hematoma CT CERVICAL SPINE FINDINGS Alignment: No subluxation.  Facet alignment within normal limits. Skull base and vertebrae: No acute fracture. No primary bone lesion or focal pathologic process. Soft tissues and spinal canal: No prevertebral fluid or swelling. No visible canal hematoma. Disc levels: Mild degenerative changes at C5-C6, moderate degenerative changes at C6-C7. Upper chest: Lung apices are clear. Multiple hypodense thyroid nodules measuring up to 13 mm. Other: None IMPRESSION: 1. No CT evidence for acute intracranial abnormality. Atrophy with minimal small vessel ischemic changes of the white matter. Small left posterior parietal scalp hematoma 2. No acute osseous abnormality  of the cervical spine 3. Multiple hypodense thyroid nodules. Electronically Signed   By: Donavan Foil M.D.   On: 12/22/2017 00:49   Ct Cervical Spine Wo Contrast  Result Date: 12/22/2017 CLINICAL DATA:  [place] in kitchen hematoma to the posterior head and neck EXAM: CT HEAD WITHOUT CONTRAST CT CERVICAL SPINE WITHOUT CONTRAST TECHNIQUE: Multidetector CT imaging of the head and cervical spine was performed following the standard protocol without intravenous contrast. Multiplanar CT image reconstructions of the cervical spine were also generated. COMPARISON:  CT brain 02/06/2016, radiograph 01/14/2016 FINDINGS: CT HEAD FINDINGS Brain: No acute territorial infarction, hemorrhage or intracranial mass is visualized. Mild to moderate atrophy. Minimal small vessel ischemic changes of the white matter. Stable ventricle size. Vascular: No hyperdense vessels. Minimal carotid vascular calcification. Skull: No fracture Sinuses/Orbits: No acute finding. Other: Small left posterior parietal scalp hematoma CT CERVICAL SPINE FINDINGS Alignment: No subluxation.  Facet alignment within normal limits. Skull base and vertebrae: No acute fracture. No  primary bone lesion or focal pathologic process. Soft tissues and spinal canal: No prevertebral fluid or swelling. No visible canal hematoma. Disc levels: Mild degenerative changes at C5-C6, moderate degenerative changes at C6-C7. Upper chest: Lung apices are clear. Multiple hypodense thyroid nodules measuring up to 13 mm. Other: None IMPRESSION: 1. No CT evidence for acute intracranial abnormality. Atrophy with minimal small vessel ischemic changes of the white matter. Small left posterior parietal scalp hematoma 2. No acute osseous abnormality of the cervical spine 3. Multiple hypodense thyroid nodules. Electronically Signed   By: Donavan Foil M.D.   On: 12/22/2017 00:49    Cardiac Studies   2d echo/nuc pending  Patient Profile     84 y.o. female with diabetes, HTN, HLD, GERD,  esophageal stricture and mild aortic root dilatation and no known CAD with neg nuc study 06/2016 who was admitted with fall and chest pain. Presented to hospital after she lost her balance and fell and hit head on kitchen sink. She developed subsequent chest pressure after admission as well as DOE in recent days.  Assessment & Plan    1. Chest pressure - for nuc today. R/o for MI. Will defer decision regarding PE w/u to primary team given elevated d-dimer. Adjusted for age rule, this is actually still wnl. She is not tachycardic, tachypneic or hypoxic.  2. Cardiomegaly - 2D echo pending.  3. Hyperlipidemia - continue statin but may need to escalate if CAD confirmed this admission.  4. HTN - now controlled.  5. Fall/mechanical - consider PT eval as inpatient, will defer to primary team.  6. Hyponatremia/hypokalemia - per primary team.   For questions or updates, please contact [place] Please consult [email or web address] for contact info under Cardiology/STEMI.  Signed, Charlie Pitter, PA-C 12/23/2017, 9:54 AM    History and all data above reviewed.  Patient examined.  I agree with the findings as above.  She is not having chest pain or SOB.   She is upset and feels "quivery" inside.  Her heart rate is sinus tach 105.  I reviewed telemetry and it has been steadily going up since she came back from her stress test.    The patient exam reveals COR:RRR  ,  Lungs: Clear  ,  Abd: Positive bowel sounds, no rebound no guarding, Ext No edema  .  All available labs, radiology testing, previous records reviewed. Agree with documented assessment and plan. Sinus tachycardia:  She is upset about not getting anything to eat.  This might be driving her rate.  I have discussed this with her nurse and her food has been ordered.   Chest pain:  No objective evidence of ischemia.  Lexiscan Myoview results pending.   Jeneen Rinks Zan Triska  2:02 PM  12/23/2017

## 2017-12-23 NOTE — Progress Notes (Signed)
   [name] presented for a nuclear stress test today.  No immediate complications.  Stress imaging is pending at this time.  Preliminary EKG findings may be listed in the chart, but the stress test result will not be finalized until perfusion imaging is complete.  Charlie Pitter, PA-C 12/23/2017, 10:09 AM

## 2017-12-23 NOTE — Progress Notes (Signed)
Nuc reviewed, normal. 2D echo unremarkable.  F/u scheduled 6/10 at 10am in [facility] office. Had spoken with IM earlier. Have relayed to nurse who will pass on to patient.Lisbeth Renshaw Dunn PA-C

## 2017-12-23 NOTE — Progress Notes (Signed)
Nuclear med called and will be here shortly to take patient for stress test.

## 2017-12-23 NOTE — Progress Notes (Signed)
Discharge instructions reviewed with patient and patients daughter. IV and purewick discontinued.

## 2017-12-23 NOTE — Progress Notes (Addendum)
Physical Therapy Treatment Patient Details Name: Debra Phillips MRN: [id number] DOB: 1929/08/22 Today's Date: 12/23/2017    History of Present Illness Pt is an 84 y.o. female admitted 12/21/17 post-fall and c/o body aches, including chest pressure. Chest pain improved with phentermine which was given for the body aches; cycling cardiac markers to rule out ACS. Head and C-spine imaging showed no acute abnormality, except scalp hematoma. PMH includes uncontrolled HTN, DM, chronic LBP, lumbar fusion, L TKA, osteoporosis.    PT Comments    Pt received in bed willing to participate in PT with family present.  Today's session focused on gait training and transfers.  Pt was Min Guard for transfers and gait due to safety.  VC's needed to stay inside walker during ambulation.  Chair follow was provided by transport because pt was about to be taken to a procedure.  She was left in [facility] with transport by elevators.  Pt will benefit from continued POC to improve activity tolerance and functional independence.      Follow Up Recommendations  Home health PT;Supervision - Intermittent     Equipment Recommendations  None recommended by PT    Recommendations for Other Services       Precautions / Restrictions Precautions Precautions: Fall Restrictions Weight Bearing Restrictions: No    Mobility  Bed Mobility Overal bed mobility: Modified Independent             General bed mobility comments: Increased time and effort, but no physical assist required  Transfers Overall transfer level: Needs assistance Equipment used: Rolling walker (2 wheeled) Transfers: Sit to/from Stand Sit to Stand: Min guard         General transfer comment: Stood from bed with RW, min guard for safety; cues for correct hand placement on RW  Ambulation/Gait Ambulation/Gait assistance: Min guard Ambulation Distance (Feet): 150 Feet Assistive device: Rolling walker (2 wheeled) Gait Pattern/deviations:  Step-through pattern;Decreased stride length;Trunk flexed Gait velocity: Decreased   General Gait Details: Slow, steady amb with RW and min guard for safety; cues to maintain closer proximity to RW.  WC follow for safety by transport.  Pt ended gait at elevator where she was transported to a procedure via WC.   Stairs             Wheelchair Mobility    Modified Rankin (Stroke Patients Only)       Balance Overall balance assessment: Needs assistance             Standing balance comment: Can static stand with no UE support                            Cognition Arousal/Alertness: Awake/alert Behavior During Therapy: WFL for tasks assessed/performed Overall Cognitive Status: Within Functional Limits for tasks assessed                                 General Comments: Pt seems fatigued, but overall WFL      Exercises      General Comments        Pertinent Vitals/Pain Pain Assessment: 0-10 Pain Score: 2  Pain Location: Back Pain Descriptors / Indicators: Constant;Sore Pain Intervention(s): Limited activity within patient's tolerance;Monitored during session;Repositioned    Home Living                      Prior Function  PT Goals (current goals can now be found in the care plan section) Acute Rehab PT Goals Patient Stated Goal: Return home and feel better PT Goal Formulation: With patient Time For Goal Achievement: 01/05/18 Potential to Achieve Goals: Good Progress towards PT goals: Progressing toward goals    Frequency    Min 3X/week      PT Plan      Co-evaluation              AM-PAC PT "6 Clicks" Daily Activity  Outcome Measure  Difficulty turning over in bed (including adjusting bedclothes, sheets and blankets)?: None Difficulty moving from lying on back to sitting on the side of the bed? : None Difficulty sitting down on and standing up from a chair with arms (e.g., wheelchair,  bedside commode, etc,.)?: A Little Help needed moving to and from a bed to chair (including a wheelchair)?: A Little Help needed walking in hospital room?: A Little Help needed climbing 3-5 steps with a railing? : A Little 6 Click Score: 20    End of Session Equipment Utilized During Treatment: Gait belt Activity Tolerance: Patient tolerated treatment well;Patient limited by fatigue Patient left: Other (comment)(In WC with nurse transport to go to procedure.  ) Nurse Communication: Mobility status PT Visit Diagnosis: Other abnormalities of gait and mobility (R26.89)     Time: 1404-1425(minus approx 5 minutes for bathroom) PT Time Calculation (min) (ACUTE ONLY): 21 min  Charges:  $Therapeutic Activity: 8-22 mins                    G Codes:       Terri Skains, SPTA [phone]    Terri Skains 12/23/2017, 4:44 PM   I was present during the PT session and agree with patient status and findings as outlined by Lyman Speller.  Roney Marion, [place]  Acute Rehabilitation Services Pager [phone] Office [phone]  The licensed clinician was present and actively directing the care throughout the session at all times.

## 2017-12-23 NOTE — Discharge Summary (Addendum)
Physician Discharge Summary  Patient ID: Debra Phillips MRN: [id number] DOB/AGE: 12/02/1928 84 y.o.  Admit date: 12/21/2017 Discharge date: 12/23/2017  Admission Diagnoses:  Discharge Diagnoses:  Principal Problem:   Chest pain Active Problems:    Fall   Essential hypertension   Type 2 diabetes mellitus with hyperlipidemia ([place])   Hyponatremia   Discharged Condition: stable  Hospital Course: Patient is an 84-year-old Caucasian female with past medical history significant for hypertension, hyperlipidemia, GERD, esophageal stricture, diabetes mellitus, and cataract.  Patient was admitted with fall and chest pain.  Cardiac enzymes were cycled and they came back negative.  Patient proceeded with cardiac stress test that was negative for ischemia.  Currently, patient is chest pain-free.  Patient was also seen by physical therapy team.  Home health physical therapy has been recommended.  Patient lives alone at home.  Patient has 5 children, but does not want to live with any of them.  According to the patient, she lacks been independent.  Patient has life alert.  Patient will be discharged back home to the care of the primary care provider.  Patient will follow with the cardiology team, as well as a primary care team 84-year-old within 1 to 2 weeks of discharge.  Chest pain:  Patient was worked up for possible acute coronary syndrome.  Cardiac enzymes came back negative.  EKG is noted.  Cardiology team was consulted.  Cardiac stress test was negative for ischemia.  Patient is chest pain-free.  Hyponatremia: This was felt to be multifactorial.  Patient was volume depleted, and was on HCTZ.  Patient's home medications also included Lasix as needed.  Patient has been volume repleted.  HCTZ and Lasix will be held.  PCP will kindly continue to monitor volume status.  Lisinopril will be increased from 20 mg p.o. once daily to 40 mg p.o. once daily to assist with blood pressure control.  Sodium level has gone from 1  29-132.   Hypertension uncontrolled: Kindly see above.   Lisinopril dose will be doubled.  Cardizem will be continued at current dose.  PCP will kindly monitor blood pressure closely and adjust medication as deemed necessary.  HCTZ will be held due to hyponatremia.  Diabetes mellitus type 2:  This was optimized during the hospital stay.  Fall with scalp hematoma: Patient was seen by the physical therapy team during the hospital stay.  Home health physical therapy has been recommended.  Patient will be discharged with home health PT and nursing.  Hyperlipidemia: Continue statins.  Consults: cardiology  Significant Diagnostic Studies: nuclear medicine: Cardiac stress test with negative for ischemia  Discharge Exam: Blood pressure (!) 155/77, pulse 85, temperature 98.3 F (36.8 C), temperature source Oral, resp. rate 15, height 5\' 3"  (1.6 m), weight 76.5 kg (168 lb 10.4 oz), SpO2 100 %.   Disposition: Discharge disposition: 06-Home-Health Care Svc       Discharge Instructions    Call MD for:   Complete by:  As directed    Please call MD if symptoms worsen   Diet - low sodium heart healthy   Complete by:  As directed    Increase activity slowly   Complete by:  As directed      Allergies as of 12/23/2017      Reactions   Codeine Nausea And Vomiting   Doxycycline Hives   Other Nausea Only   Almost all antibiotics   Sulfonamide Derivatives Hives   Vancomycin Itching      Medication List  STOP taking these medications   acetaminophen 650 MG CR tablet Commonly known as:  TYLENOL   CINNAMON PO   COQ10 PO   furosemide 20 MG tablet Commonly known as:  LASIX   lisinopril-hydrochlorothiazide 10-12.5 MG tablet Commonly known as:  PRINZIDE,ZESTORETIC   traMADol 50 MG tablet Commonly known as:  ULTRAM   TURMERIC PO     TAKE these medications   aspirin EC 81 MG tablet Take 81 mg by mouth daily.   Calcium Carb-Cholecalciferol 600-500 MG-UNIT Caps Take 1 capsule  by mouth every morning.   cephALEXin 250 MG capsule Commonly known as:  KEFLEX Take 1 capsule by mouth at bedtime.   cetirizine 5 MG tablet Commonly known as:  ZYRTEC TAKE 1 TABLET DAILY What changed:    how much to take  how to take this  when to take this   Cholecalciferol 1000 units tablet Take 1,000 Units by mouth daily.   diltiazem 180 MG 24 hr capsule Commonly known as:  CARDIZEM CD TAKE (1) CAPSULE DAILY   ESTRACE VAGINAL 0.1 MG/GM vaginal cream Generic drug:  estradiol Place 1 Applicatorful vaginally once a week.   Fish Oil 1000 MG Caps Take 1,000 mg by mouth every morning.   hydroxypropyl methylcellulose / hypromellose 2.5 % ophthalmic solution Commonly known as:  ISOPTO TEARS / GONIOVISC Place 1 drop into both eyes 3 (three) times daily as needed for dry eyes.   ICAPS AREDS 2 PO Take 1 capsule by mouth daily.   lisinopril 40 MG tablet Commonly known as:  PRINIVIL,ZESTRIL Take 1 tablet (40 mg total) by mouth daily. Start taking on:  12/24/2017   metFORMIN 500 MG tablet Commonly known as:  GLUCOPHAGE TAKE (1) TABLET TWICE A DAY.   MYRBETRIQ 25 MG Tb24 tablet Generic drug:  mirabegron ER Take 1 tablet (25 mg total) by mouth daily.   nitroGLYCERIN 0.4 MG SL tablet Commonly known as:  NITROSTAT Place 1 tablet (0.4 mg total) under the tongue every 5 (five) minutes as needed for chest pain.   omeprazole 40 MG capsule Commonly known as:  PRILOSEC Take 1 capsule (40 mg total) by mouth 2 (two) times daily. What changed:  when to take this   ONE TOUCH ULTRA TEST test strip Generic drug:  glucose blood CHECK BLOOD SUGAR ONCE A DAY   potassium chloride 10 MEQ tablet Commonly known as:  K-DUR TAKE (1) TABLET DAILY AS DIRECTED. What changed:  See the new instructions.   pravastatin 40 MG tablet Commonly known as:  PRAVACHOL Take 1 tablet (40 mg total) by mouth daily.        SignedBonnell Public 12/23/2017, 3:33 PM

## 2017-12-24 ENCOUNTER — Telehealth: Payer: Self-pay | Admitting: Family Medicine

## 2017-12-24 NOTE — Telephone Encounter (Signed)
PT daughter Mariann Laster is calling to speak to Roselyn Reef, pt was discharged from hospital last night and is needing to see moore soon to follow up from hospital and wanda is wanting to speak to Piney about her medications

## 2017-12-24 NOTE — Telephone Encounter (Signed)
Hosp follow up = appt made - spoke with daughter

## 2017-12-31 ENCOUNTER — Other Ambulatory Visit: Payer: Medicare Other

## 2017-12-31 DIAGNOSIS — E871 Hypo-osmolality and hyponatremia: Secondary | ICD-10-CM | POA: Diagnosis not present

## 2018-01-01 DIAGNOSIS — N390 Urinary tract infection, site not specified: Secondary | ICD-10-CM | POA: Diagnosis not present

## 2018-01-01 DIAGNOSIS — N3020 Other chronic cystitis without hematuria: Secondary | ICD-10-CM | POA: Diagnosis not present

## 2018-01-01 LAB — BMP8+EGFR
BUN / CREAT RATIO: 12 (ref 12–28)
BUN: 10 mg/dL (ref 8–27)
CO2: 25 mmol/L (ref 20–29)
Calcium: 9.5 mg/dL (ref 8.7–10.3)
Chloride: 97 mmol/L (ref 96–106)
Creatinine, Ser: 0.84 mg/dL (ref 0.57–1.00)
GFR calc Af Amer: 72 mL/min/{1.73_m2} (ref >59)
GFR calc non Af Amer: 63 mL/min/{1.73_m2} (ref >59)
GLUCOSE: 184 mg/dL — AB (ref 65–99)
Potassium: 4.8 mmol/L (ref 3.5–5.2)
SODIUM: 136 mmol/L (ref 134–144)

## 2018-01-07 ENCOUNTER — Encounter: Payer: Self-pay | Admitting: Family Medicine

## 2018-01-07 ENCOUNTER — Ambulatory Visit (INDEPENDENT_AMBULATORY_CARE_PROVIDER_SITE_OTHER): Payer: Medicare Other | Admitting: Family Medicine

## 2018-01-07 VITALS — BP 138/74 | HR 70 | Temp 96.7°F | Ht 63.0 in | Wt 167.0 lb

## 2018-01-07 DIAGNOSIS — Z9181 History of falling: Secondary | ICD-10-CM | POA: Diagnosis not present

## 2018-01-07 DIAGNOSIS — E871 Hypo-osmolality and hyponatremia: Secondary | ICD-10-CM | POA: Diagnosis not present

## 2018-01-07 DIAGNOSIS — E876 Hypokalemia: Secondary | ICD-10-CM | POA: Diagnosis not present

## 2018-01-07 DIAGNOSIS — R531 Weakness: Secondary | ICD-10-CM | POA: Diagnosis not present

## 2018-01-07 DIAGNOSIS — R55 Syncope and collapse: Secondary | ICD-10-CM | POA: Diagnosis not present

## 2018-01-07 DIAGNOSIS — M48061 Spinal stenosis, lumbar region without neurogenic claudication: Secondary | ICD-10-CM

## 2018-01-07 DIAGNOSIS — R2681 Unsteadiness on feet: Secondary | ICD-10-CM | POA: Diagnosis not present

## 2018-01-07 LAB — BMP8+EGFR
BUN/Creatinine Ratio: 15 (ref 12–28)
BUN: 12 mg/dL (ref 8–27)
CALCIUM: 9.8 mg/dL (ref 8.7–10.3)
CHLORIDE: 98 mmol/L (ref 96–106)
CO2: 24 mmol/L (ref 20–29)
Creatinine, Ser: 0.8 mg/dL (ref 0.57–1.00)
GFR, EST AFRICAN AMERICAN: 77 mL/min/{1.73_m2} (ref >59)
GFR, EST NON AFRICAN AMERICAN: 67 mL/min/{1.73_m2} (ref >59)
Glucose: 174 mg/dL — ABNORMAL HIGH (ref 65–99)
Potassium: 5.1 mmol/L (ref 3.5–5.2)
Sodium: 138 mmol/L (ref 134–144)

## 2018-01-07 NOTE — Addendum Note (Signed)
Addended by: Zannie Cove on: 01/07/2018 10:41 AM   Modules accepted: Orders

## 2018-01-07 NOTE — Patient Instructions (Signed)
We will schedule home physical therapy to come and work with you at home to help strengthen your gait and keep you from falling Please take the Zantac twice daily before breakfast and supper We will also schedule you to see the cardiologist as requested from the hospital discharge summary

## 2018-01-07 NOTE — Progress Notes (Signed)
Subjective:    Patient ID: Debra Phillips, female    DOB: 04/10/1929, 83 y.o.   MRN: [id number]  HPI Patient here today for hospital follow up from [facility] where she was admitted on 12/21/17 to 12/23/17. She had a fall which caused a concussion and chest pain.  The patient recently had a fall and ended up in the hospital.  Cardiac enzymes came back negative and the cardiologist did see her and she had a stress test that was negative for ischemia.  She did have hyponatremia and as a result of the visit the HCTZ and Lasix were held and she was increased with her lisinopril to 40 mg daily.  She had a scalp hematoma and a cardiac stress test was negative.  When she was discharged her blood pressure was 155/77.  Patient is alert and positive today but still sore from her fall.  Her medicines were reviewed with her.  She denies any chest pain but but does still have some shortness of breath.  She denies any problems with her stomach other than occasional heartburn and admits to not taking her ranitidine regularly twice daily as she should be doing and she was reminded that it was important to do this and her daughter will try to help get her medications situated as much as possible at home.  She is passing her water okay.  She will need an appointment to see the cardiologist will also need to be scheduled for home health physical therapy.  We will check a BMP today.   Patient Active Problem List   Diagnosis Date Noted  . Hyponatremia 12/22/2017  . Dysuria 01/22/2017  . Pelvic prolapse 08/28/2016  . Tremor 01/29/2016  . Abnormality of gait 01/29/2016  . Type 2 diabetes mellitus with hyperlipidemia ([place]) 01/14/2016  . Age-related macular degeneration 07/13/2015  . Keratoconjunctivitis sicca of both eyes ([place]) 07/13/2015  . Meibomian gland dysfunction (MGD), bilateral, both upper and lower lids 07/13/2015  . Pseudophakia of both eyes 07/13/2015  . Osteoarthritis of lumbar spine 05/08/2015  . Spondylosis  of lumbar region without myelopathy or radiculopathy 09/01/2014  . Diabetes type 2, controlled ([place]) 04/26/2014  . Vitamin D deficiency 04/26/2014  . Chronic cystitis 04/26/2014  . Atrophic vaginitis 04/26/2014  . Solitary pulmonary nodule 03/20/2014  . Chest pain 03/18/2014  . Osteoporotic compression fracture of spine with routine healing 03/08/2014  . PALPITATIONS 02/27/2010  . Coronary atherosclerosis 12/12/2009  . Aortic aneurysm of unspecified site without mention of rupture 06/27/2009  . DYSPNEA 06/27/2009  . DYSLIPIDEMIA 12/12/2008  . Essential hypertension 12/12/2008  . GERD 12/12/2008  . OSTEOARTHRITIS 12/12/2008   Outpatient Encounter Medications as of 01/07/2018  Medication Sig  . aspirin EC 81 MG tablet Take 81 mg by mouth daily.  . Calcium Carb-Cholecalciferol 600-500 MG-UNIT CAPS Take 1 capsule by mouth every morning.  . cephALEXin (KEFLEX) 250 MG capsule Take 250 mg by mouth at bedtime.  . cetirizine (ZYRTEC) 5 MG tablet TAKE 1 TABLET DAILY (Patient taking differently: TAKE 1 TABLET DAILY AS NEEDED FOR ALLERGIES)  . Cholecalciferol 1000 UNITS tablet Take 1,000 Units by mouth daily.   Marland Kitchen diltiazem (CARDIZEM CD) 180 MG 24 hr capsule TAKE (1) CAPSULE DAILY  . ESTRACE VAGINAL 0.1 MG/GM vaginal cream Place 1 Applicatorful vaginally once a week.   . hydroxypropyl methylcellulose / hypromellose (ISOPTO TEARS / GONIOVISC) 2.5 % ophthalmic solution Place 1 drop into both eyes 3 (three) times daily as needed for dry eyes.  Marland Kitchen lisinopril (  PRINIVIL,ZESTRIL) 40 MG tablet Take 1 tablet (40 mg total) by mouth daily.  . metFORMIN (GLUCOPHAGE) 500 MG tablet TAKE (1) TABLET TWICE A DAY.  . Multiple Vitamins-Minerals (ICAPS AREDS 2 PO) Take 1 capsule by mouth daily.   Marland Kitchen MYRBETRIQ 25 MG TB24 tablet Take 1 tablet (25 mg total) by mouth daily.  . nitroGLYCERIN (NITROSTAT) 0.4 MG SL tablet Place 1 tablet (0.4 mg total) under the tongue every 5 (five) minutes as needed for chest pain.  . Omega-3  Fatty Acids (FISH OIL) 1000 MG CAPS Take 1,000 mg by mouth every morning.   Marland Kitchen omeprazole (PRILOSEC) 40 MG capsule Take 1 capsule (40 mg total) by mouth 2 (two) times daily. (Patient taking differently: Take 40 mg by mouth every morning. )  . ONE TOUCH ULTRA TEST test strip CHECK BLOOD SUGAR ONCE A DAY  . potassium chloride (K-DUR) 10 MEQ tablet TAKE (1) TABLET DAILY AS DIRECTED. (Patient taking differently: TAKE (1) TABLET DAILY)  . pravastatin (PRAVACHOL) 40 MG tablet Take 1 tablet (40 mg total) by mouth daily.  . [DISCONTINUED] cephALEXin (KEFLEX) 250 MG capsule Take 1 capsule by mouth at bedtime.   No facility-administered encounter medications on file as of 01/07/2018.       Review of Systems  Constitutional: Negative.   HENT: Negative.   Eyes: Negative.   Respiratory: Negative.   Cardiovascular: Negative.   Gastrointestinal: Negative.   Endocrine: Negative.   Genitourinary: Negative.   Musculoskeletal: Positive for arthralgias (from fall ), back pain and neck pain.  Skin: Negative.   Allergic/Immunologic: Negative.   Neurological: Positive for headaches.  Hematological: Negative.   Psychiatric/Behavioral: Negative.        Objective:   Physical Exam  Constitutional: She is oriented to person, place, and time. She appears well-developed and well-nourished. No distress.  Patient is pleasant and alert and her daughter is with her during the visit.  HENT:  Head: Normocephalic and atraumatic.  Right Ear: External ear normal.  Left Ear: External ear normal.  Nose: Nose normal.  Mouth/Throat: Oropharynx is clear and moist. No oropharyngeal exudate.  Eyes: Pupils are equal, round, and reactive to light. Conjunctivae and EOM are normal. Right eye exhibits no discharge. Left eye exhibits no discharge.  Neck: Normal range of motion. Neck supple. No thyromegaly present.  Cardiovascular: Normal rate, regular rhythm and normal heart sounds.  No murmur heard. Heart was regular at  72/min  Pulmonary/Chest: Effort normal and breath sounds normal. She has no wheezes. She has no rales.  Abdominal: Soft. Bowel sounds are normal. She exhibits no mass. There is tenderness. There is no guarding.  Generalized abdominal tenderness but especially in the epigastric area.  Musculoskeletal: She exhibits no edema or tenderness.  Patient is hesitant with her movements with arising and sitting and getting on the exam table and off the exam table.  She had good leg raising bilaterally.  Lymphadenopathy:    She has no cervical adenopathy.  Neurological: She is alert and oriented to person, place, and time. She has normal reflexes.  Skin: Skin is warm and dry. No rash noted.  Psychiatric: She has a normal mood and affect. Her behavior is normal. Judgment and thought content normal.  Mood affect and behavior all normal.  Patient is laughing and smiling.  Daughter thinks she may need an antidepressant.  We will wait until the cardiac visit occurs and then will consider that after that time.  Nursing note and vitals reviewed.  BP 138/74 (BP Location:  Left Arm)   Pulse 70   Temp (!) 96.7 F (35.9 C) (Oral)   Ht _0  (1.6 m)   Wt 167 lb (75.8 kg)   BMI 29.58 kg/m         Assessment & Plan:  1. Decreased potassium in the blood - BMP8+EGFR  2. Hyponatremia -BMP  3. Spinal stenosis of lumbar region, unspecified whether neurogenic claudication present -This problem with her spine could certainly playing a role with her increased falls.  We will get physical therapy to come by and work with her at home.  4.  Syncope at home with no reason for this -We will get a cardiac follow-up as requested from the hospital discharge summary with her cardiologist, Dr. Percival Spanish.  No orders of the defined types were placed in this encounter.  Patient Instructions  We will schedule home physical therapy to come and work with you at home to help strengthen your gait and keep you from  falling Please take the Zantac twice daily before breakfast and supper We will also schedule you to see the cardiologist as requested from the hospital discharge summary   Arrie Senate MD

## 2018-01-09 DIAGNOSIS — S060X0D Concussion without loss of consciousness, subsequent encounter: Secondary | ICD-10-CM | POA: Diagnosis not present

## 2018-01-09 DIAGNOSIS — S0003XD Contusion of scalp, subsequent encounter: Secondary | ICD-10-CM | POA: Diagnosis not present

## 2018-01-12 DIAGNOSIS — S060X0D Concussion without loss of consciousness, subsequent encounter: Secondary | ICD-10-CM | POA: Diagnosis not present

## 2018-01-12 DIAGNOSIS — S0003XD Contusion of scalp, subsequent encounter: Secondary | ICD-10-CM | POA: Diagnosis not present

## 2018-01-14 DIAGNOSIS — S060X0D Concussion without loss of consciousness, subsequent encounter: Secondary | ICD-10-CM | POA: Diagnosis not present

## 2018-01-14 DIAGNOSIS — S0003XD Contusion of scalp, subsequent encounter: Secondary | ICD-10-CM | POA: Diagnosis not present

## 2018-01-19 ENCOUNTER — Other Ambulatory Visit: Payer: Self-pay

## 2018-01-19 DIAGNOSIS — S0003XD Contusion of scalp, subsequent encounter: Secondary | ICD-10-CM | POA: Diagnosis not present

## 2018-01-19 DIAGNOSIS — S060X0D Concussion without loss of consciousness, subsequent encounter: Secondary | ICD-10-CM | POA: Diagnosis not present

## 2018-01-19 MED ORDER — LISINOPRIL 40 MG PO TABS: 40.0000 mg | ORAL_TABLET | Freq: Every day | ORAL | 5 refills | Status: DC

## 2018-01-21 DIAGNOSIS — S0003XD Contusion of scalp, subsequent encounter: Secondary | ICD-10-CM | POA: Diagnosis not present

## 2018-01-21 DIAGNOSIS — S060X0D Concussion without loss of consciousness, subsequent encounter: Secondary | ICD-10-CM | POA: Diagnosis not present

## 2018-01-22 ENCOUNTER — Ambulatory Visit: Payer: Medicare Other | Admitting: Physician Assistant

## 2018-01-26 DIAGNOSIS — S0003XD Contusion of scalp, subsequent encounter: Secondary | ICD-10-CM | POA: Diagnosis not present

## 2018-01-26 DIAGNOSIS — S060X0D Concussion without loss of consciousness, subsequent encounter: Secondary | ICD-10-CM | POA: Diagnosis not present

## 2018-01-28 DIAGNOSIS — S060X0D Concussion without loss of consciousness, subsequent encounter: Secondary | ICD-10-CM | POA: Diagnosis not present

## 2018-01-28 DIAGNOSIS — S0003XD Contusion of scalp, subsequent encounter: Secondary | ICD-10-CM | POA: Diagnosis not present

## 2018-02-01 ENCOUNTER — Ambulatory Visit: Payer: Medicare Other | Admitting: Physician Assistant

## 2018-02-03 ENCOUNTER — Ambulatory Visit (INDEPENDENT_AMBULATORY_CARE_PROVIDER_SITE_OTHER): Payer: Medicare Other

## 2018-02-03 DIAGNOSIS — S0003XD Contusion of scalp, subsequent encounter: Secondary | ICD-10-CM

## 2018-02-03 DIAGNOSIS — I2510 Atherosclerotic heart disease of native coronary artery without angina pectoris: Secondary | ICD-10-CM

## 2018-02-03 DIAGNOSIS — I719 Aortic aneurysm of unspecified site, without rupture: Secondary | ICD-10-CM

## 2018-02-03 DIAGNOSIS — E559 Vitamin D deficiency, unspecified: Secondary | ICD-10-CM

## 2018-02-03 DIAGNOSIS — M47812 Spondylosis without myelopathy or radiculopathy, cervical region: Secondary | ICD-10-CM

## 2018-02-03 DIAGNOSIS — E871 Hypo-osmolality and hyponatremia: Secondary | ICD-10-CM

## 2018-02-03 DIAGNOSIS — M48061 Spinal stenosis, lumbar region without neurogenic claudication: Secondary | ICD-10-CM | POA: Diagnosis not present

## 2018-02-03 DIAGNOSIS — M1990 Unspecified osteoarthritis, unspecified site: Secondary | ICD-10-CM

## 2018-02-03 DIAGNOSIS — E1169 Type 2 diabetes mellitus with other specified complication: Secondary | ICD-10-CM

## 2018-02-03 DIAGNOSIS — R251 Tremor, unspecified: Secondary | ICD-10-CM

## 2018-02-03 DIAGNOSIS — E785 Hyperlipidemia, unspecified: Secondary | ICD-10-CM | POA: Diagnosis not present

## 2018-02-03 DIAGNOSIS — H16223 Keratoconjunctivitis sicca, not specified as Sjogren's, bilateral: Secondary | ICD-10-CM

## 2018-02-03 DIAGNOSIS — R911 Solitary pulmonary nodule: Secondary | ICD-10-CM

## 2018-02-03 DIAGNOSIS — H0288A Meibomian gland dysfunction right eye, upper and lower eyelids: Secondary | ICD-10-CM

## 2018-02-03 DIAGNOSIS — K219 Gastro-esophageal reflux disease without esophagitis: Secondary | ICD-10-CM

## 2018-02-03 DIAGNOSIS — S060X0D Concussion without loss of consciousness, subsequent encounter: Secondary | ICD-10-CM | POA: Diagnosis not present

## 2018-02-03 DIAGNOSIS — M47816 Spondylosis without myelopathy or radiculopathy, lumbar region: Secondary | ICD-10-CM | POA: Diagnosis not present

## 2018-02-03 DIAGNOSIS — I10 Essential (primary) hypertension: Secondary | ICD-10-CM | POA: Diagnosis not present

## 2018-02-03 DIAGNOSIS — H0288B Meibomian gland dysfunction left eye, upper and lower eyelids: Secondary | ICD-10-CM

## 2018-02-03 DIAGNOSIS — M170 Bilateral primary osteoarthritis of knee: Secondary | ICD-10-CM

## 2018-02-03 DIAGNOSIS — H3530 Unspecified macular degeneration: Secondary | ICD-10-CM

## 2018-02-15 ENCOUNTER — Encounter: Payer: Self-pay | Admitting: *Deleted

## 2018-02-15 ENCOUNTER — Ambulatory Visit (INDEPENDENT_AMBULATORY_CARE_PROVIDER_SITE_OTHER): Payer: Medicare Other | Admitting: *Deleted

## 2018-02-15 VITALS — BP 136/81 | HR 80 | Ht 60.0 in | Wt 170.0 lb

## 2018-02-15 DIAGNOSIS — Z1231 Encounter for screening mammogram for malignant neoplasm of breast: Secondary | ICD-10-CM | POA: Diagnosis not present

## 2018-02-15 DIAGNOSIS — Z0000 Encounter for general adult medical examination without abnormal findings: Secondary | ICD-10-CM

## 2018-02-15 DIAGNOSIS — E871 Hypo-osmolality and hyponatremia: Secondary | ICD-10-CM

## 2018-02-15 LAB — HM MAMMOGRAPHY

## 2018-02-15 NOTE — Patient Instructions (Signed)
  Debra Phillips , Thank you for taking time to come for your Medicare Wellness Visit. I appreciate your ongoing commitment to your health goals. Please review the following plan we discussed and let me know if I can assist you in the future.   These are the goals we discussed: Goals    . Exercise 150 min/wk Moderate Activity     Use handouts from physical therapy       This is a list of the screening recommended for you and due dates:  Health Maintenance  Topic Date Due  . Pneumonia vaccines (2 of 2 - PPSV23) 07/29/2018*  . Tetanus Vaccine  12/02/2018*  . Flu Shot  03/25/2018  . Hemoglobin A1C  06/02/2018  . DEXA scan (bone density measurement)  06/13/2018  . Eye exam for diabetics  07/07/2018  . Complete foot exam   07/29/2018  *Topic was postponed. The date shown is not the original due date.

## 2018-02-15 NOTE — Progress Notes (Addendum)
Subjective:   Debra Phillips is a 84 y.o. female who presents for a Medicare Annual Wellness Visit. Katya lives at home alone. She has 3 sons and 2 daughters. One son lives very close by and could be at her house in about 2 to 3 minutes. She enjoys gardening and has a small vegetable garden planted. She is afraid of falling though and is very cautious. She does her use cane. She visits the nursing home after church on Sundays. Her family calls several times a day to check on her and worries when she doesn't answer.    Review of Systems    Patient reports that her overall health is unchanged compared to last year.  Cardiac Risk Factors include: advanced age (>46mn, >>48women);diabetes mellitus;dyslipidemia;hypertension;obesity (BMI >30kg/m2);sedentary lifestyle  Cardiovascular: bilateral lower extremity edema since hospital d/c lasix and HCTZ  Musculoskeletal: Chronic back, hip and leg pain  All other systems negative       Current Medications (verified) Outpatient Encounter Medications as of 02/15/2018  Medication Sig  . aspirin EC 81 MG tablet Take 81 mg by mouth daily.  . Calcium Carb-Cholecalciferol 600-500 MG-UNIT CAPS Take 1 capsule by mouth every morning.  . cephALEXin (KEFLEX) 250 MG capsule Take 250 mg by mouth at bedtime.  . cetirizine (ZYRTEC) 5 MG tablet TAKE 1 TABLET DAILY (Patient taking differently: TAKE 1 TABLET DAILY AS NEEDED FOR ALLERGIES)  . Cholecalciferol 1000 UNITS tablet Take 1,000 Units by mouth daily.   .Marland Kitchendiltiazem (CARDIZEM CD) 180 MG 24 hr capsule TAKE (1) CAPSULE DAILY  . ESTRACE VAGINAL 0.1 MG/GM vaginal cream Place 1 Applicatorful vaginally once a week.   . hydroxypropyl methylcellulose / hypromellose (ISOPTO TEARS / GONIOVISC) 2.5 % ophthalmic solution Place 1 drop into both eyes 3 (three) times daily as needed for dry eyes.  .Marland Kitchenlisinopril (PRINIVIL,ZESTRIL) 40 MG tablet Take 1 tablet (40 mg total) by mouth daily.  . metFORMIN (GLUCOPHAGE) 500 MG  tablet TAKE (1) TABLET TWICE A DAY.  . Multiple Vitamins-Minerals (ICAPS AREDS 2 PO) Take 1 capsule by mouth daily.   .Marland KitchenMYRBETRIQ 25 MG TB24 tablet Take 1 tablet (25 mg total) by mouth daily.  . nitroGLYCERIN (NITROSTAT) 0.4 MG SL tablet Place 1 tablet (0.4 mg total) under the tongue every 5 (five) minutes as needed for chest pain.  . Omega-3 Fatty Acids (FISH OIL) 1000 MG CAPS Take 1,000 mg by mouth every morning.   .Marland Kitchenomeprazole (PRILOSEC) 40 MG capsule Take 1 capsule (40 mg total) by mouth 2 (two) times daily. (Patient taking differently: Take 40 mg by mouth every morning. )  . ONE TOUCH ULTRA TEST test strip CHECK BLOOD SUGAR ONCE A DAY  . potassium chloride (K-DUR) 10 MEQ tablet TAKE (1) TABLET DAILY AS DIRECTED. (Patient taking differently: TAKE (1) TABLET DAILY)  . pravastatin (PRAVACHOL) 40 MG tablet Take 1 tablet (40 mg total) by mouth daily.   No facility-administered encounter medications on file as of 02/15/2018.     Allergies (verified) Codeine; Doxycycline; Other; Sulfonamide derivatives; and Vancomycin   History: Past Medical History:  Diagnosis Date  . Abnormality of gait 01/29/2016  . Aortic root dilatation (HCC)    Mild  . Cataract   . Diabetes mellitus without complication ([place]    type 2  . Dyslipidemia   . Dyspnea    with exertion  . Dysrhythmia    hx of occ palpitations  . Esophageal stricture    s/p dilation  . GERD (  gastroesophageal reflux disease)   . Headache   . History of hiatal hernia   . Hyperlipidemia   . Hypertension   . Macular pucker, left eye   . Osteoarthritis   . Osteoporosis   . Tinnitus    left ear last week or so   Past Surgical History:  Procedure Laterality Date  . ABDOMINAL HYSTERECTOMY     partial  . APPENDECTOMY    . BACK SURGERY     lower x 1  . CARDIAC CATHETERIZATION  2010   both cataracts  . CHOLECYSTECTOMY  1993  . CYSTOSCOPY N/A 08/28/2016   Procedure: CYSTOSCOPY;  Surgeon: Carolan Clines, MD;  Location: [facility];   Service: Urology;  Laterality: N/A;  . EYE SURGERY     cataracts  . EYE SURGERY     for macular pucker  . FRACTURE SURGERY     rt wrist2010  . HARDWARE REMOVAL  12/10/2011   Procedure: HARDWARE REMOVAL;  Surgeon: Schuyler Amor, MD;  Location: [facility];  Service: Orthopedics;  Laterality: Right;  radial head  . KNEE ARTHROSCOPY  2004   Left and right  . KYPHOSIS SURGERY    . LUMBAR FUSION  [id number]  . RADIAL HEAD ARTHROPLASTY  5/12   orif rt radial head  . SHOULDER SURGERY     Right rotator cuff epair  . TOTAL KNEE ARTHROPLASTY     Left  . VAGINAL PROLAPSE REPAIR N/A 08/28/2016   Procedure: ANTERIOR VAGINAL VAULT SUSPENSION WITH SACROSPINOUS FIXATION;  Surgeon: Carolan Clines, MD;  Location: [facility];  Service: Urology;  Laterality: N/A;   Family History  Problem Relation Age of Onset  . Pneumonia Mother   . Brain cancer Father   . Tremor Father   . Arthritis Sister   . COPD Sister   . Emphysema Brother   . COPD Brother   . Tremor Brother   . Lung cancer Brother   . Heart disease Brother   . Liver cancer Brother   . Diabetes Other         1 child has diabetes at age 35 and the other 5 are healthy   Social History   Socioeconomic History  . Marital status: Widowed    Spouse name: Not on file  . Number of children: 5  . Years of education: 5  . Highest education level: Not on file  Occupational History  . 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 Financial resource strain: Not very hard  . Food insecurity:    Worry: Never true    Inability: Never true  . Transportation needs:    Medical: No    Non-medical: No  Tobacco Use  . Smoking status: Never Smoker  . Smokeless tobacco: Never Used  Substance and Sexual Activity  . Alcohol use: No  . Drug use: No  . Sexual activity: Not Currently  Lifestyle  . Physical activity:    Days per week: 3 days    Minutes per session: 10 min  . Stress:  Only a little  Relationships  . Social connections:    Talks on phone: More than three times a week    Gets together: More than three times a week    Attends religious service: More than 4 times per year    Active member of club or organization: Yes    Attends meetings of clubs or organizations: More than 4 times  per year    Relationship status: Widowed  Other Topics Concern  . Not on file  Social History Narrative   Pt lives at home alone   Husband w/ Alzheimer's passed away   Right-handed   Drinks 1-2 cups of coffee daily    Tobacco Use No.  Clinical Intake:  Pre-visit preparation completed: No  Pain Score: 6  Pain Type: Chronic pain Pain Location: Back Pain Descriptors / Indicators: Aching Pain Onset: More than a month ago Pain Frequency: Constant Effect of Pain on Daily Activities: moderate        How often do you need to have someone help you when you read instructions, pamphlets, or other written materials from your doctor or pharmacy?: 1 - Never What is the last grade level you completed in school?: 6  Interpreter Needed?: No  Information entered by :: Chong Sicilian, RN   Activities of Daily Living In your present state of health, do you have any difficulty performing the following activities: 02/15/2018 12/22/2017  Hearing? N N  Vision? N N  Difficulty concentrating or making decisions? N N  Walking or climbing stairs? N Y  Dressing or bathing? N N  Doing errands, shopping? N Y  Conservation officer, nature and eating ? N -  Using the Toilet? N -  In the past six months, have you accidently leaked urine? Y -  Do you have problems with loss of bowel control? N -  Managing your Medications? N -  Managing your Finances? N -  Housekeeping or managing your Housekeeping? N -  Some recent data might be hidden     Diet Has a few vegetables planted and has picked some squash, zuchinni, and squash and tomatoes Drinks water throughout the day Eats breakfast and then  supper. Likes to eat vegetables. She will eat out some but eats vegetables and not fast food.   Exercise Current Exercise Habits: Home exercise routine, Type of exercise: walking;stretching;calisthenics, Time (Minutes): 20, Frequency (Times/Week): 3, Weekly Exercise (Minutes/Week): 60, Intensity: Mild, Exercise limited by: orthopedic condition(s)   Depression Screen PHQ 2/9 Scores 02/15/2018 01/07/2018 12/01/2017 07/20/2017 03/25/2017 01/22/2017 12/06/2016  PHQ - 2 Score _0 0 1 0 0  PHQ- 9 Score - - - - - - -     Fall Risk Fall Risk  02/15/2018 01/07/2018 12/01/2017 07/20/2017 03/25/2017  Falls in the past year? Yes Yes No Yes Yes  Number falls in past yr: 1 1 - 2 or more 2 or more  Injury with Fall? Yes Yes - No Yes  Risk Factor Category  High Fall Risk - - - -  Risk for fall due to : History of fall(s);Impaired balance/gait - - - -  Follow up Falls prevention discussed - - - -  Comment - - - - -    Safety Is the patient's home free of loose throw rugs in walkways, pet beds, electrical cords, etc?   yes      Grab bars in the bathroom? no      Walkin shower? yes      Shower Seat? no      Handrails on the stairs?   yes      Adequate lighting?   yes  Patient Care Team: Chipper Herb, MD as PCP - General (Family Medicine) Minus Breeding, MD as PCP - Cardiology (Cardiology) Irine Seal, MD as Consulting Physician (Urology) Minus Breeding, MD as Consulting Physician (Cardiology) Glenna Fellows, MD as Attending Physician (Neurosurgery) Dorene Ar, MD  as Consulting Physician (Pain Medicine) Bunnie Pion, MD as Consulting Physician (Ophthalmology)  ED to hospital admission 12/21/17 for chest pain and concussion due to fall. No other ED visits, surgeries, or hospitalizations this past year.   Objective:    Today's Vitals   02/15/18 1529 02/15/18 1530  BP: 136/81   Pulse: 80   Weight: 170 lb (77.1 kg)   Height: 5' (1.524 m)   PainSc:  6    Body mass index is 33.2  kg/m.  Advanced Directives 12/22/2017 08/29/2016 08/21/2016 06/15/2015 03/18/2014 12/05/2011  Does Patient Have a Medical Advance Directive? No No No No Patient does not have advance directive;Patient would not like information Patient does not have advance directive  Would patient like information on creating a medical advance directive? No - Patient declined No - Patient declined;Yes (ED - Information included in AVS) No - Patient declined;Yes (ED - Information included in AVS) Yes - Educational materials given - -  Pre-existing out of facility DNR order (yellow form or pink MOST form) - - - - No -    Hearing/Vision  normal or No deficits noted during visit.  Cognitive Function: MMSE - Mini Mental State Exam 02/15/2018 06/15/2015  Orientation to time 5 4  Orientation to Place 5 5  Registration 3 3  Attention/ Calculation 4 5  Recall 1 2  Language- name 2 objects 2 2  Language- repeat 1 1  Language- follow 3 step command 3 3  Language- read & follow direction 1 1  Write a sentence 1 1  Copy design 1 1  Total score 27 28       Normal Cognitive Function Screening: Yes    Immunizations and Health Maintenance Immunization History  Administered Date(s) Administered  . Influenza, High Dose Seasonal PF 06/13/2016, 06/18/2017  . Influenza,inj,Quad PF,6+ Mos 06/15/2015  . Influenza-Unspecified 04/25/2013, 05/10/2014  . Pneumococcal Conjugate-13 08/24/2013  . Zoster 03/17/2014   There are no preventive care reminders to display for this patient. Health Maintenance  Topic Date Due  . PNA vac Low Risk Adult (2 of 2 - PPSV23) 07/29/2018 (Originally 08/24/2014)  . TETANUS/TDAP  12/02/2018 (Originally 08/26/2015)  . INFLUENZA VACCINE  03/25/2018  . HEMOGLOBIN A1C  06/02/2018  . DEXA SCAN  06/13/2018  . OPHTHALMOLOGY EXAM  07/07/2018  . FOOT EXAM  07/29/2018        Assessment:   This is a routine wellness examination for Kameo.    Plan:    Goals    . Exercise 150 min/wk  Moderate Activity     Use handouts from physical therapy        Health Maintenance Recommendations: No recommendations at this time   Additional Screening Recommendations: Lung: Low Dose CT Chest recommended if Age 17-80 years, 30 pack-year currently smoking OR have quit w/in 15years. Patient does not qualify. Hepatitis C Screening recommended: no  Today's Orders Orders Placed This Encounter  Procedures  . BMP8+EGFR   Take furosemide '20mg'$  tomorrow Elevate legs Wear compression hose Watch sodium intake BMP ordered to check sodium level Appt scheduled with Dr Laurance Flatten for first available which is Thursday.  F/u sooner if necessary  Continue current medications Move carefully to avoid falls. Use assistive devices like a can or walker if needed. Aim for at least 150 minutes of moderate activity a week. This can be done with chair exercises if necessary. Read or work on puzzles daily Stay connected with friends and family  I have personally reviewed and noted the  following in the patient's chart:   . Medical and social history . Use of alcohol, tobacco or illicit drugs  . Current medications and supplements . Functional ability and status . Nutritional status . Physical activity . Advanced directives . List of other physicians . Hospitalizations, surgeries, and ER visits in previous 12 months . Vitals . Screenings to include cognitive, depression, and falls . Referrals and appointments  In addition, I have reviewed and discussed with patient certain preventive protocols, quality metrics, and best practice recommendations. A written personalized care plan for preventive services as well as general preventive health recommendations were provided to patient.     Chong Sicilian, RN   02/15/2018   I have reviewed and agree with the above AWV documentation.   Mary-Margaret Hassell Done, FNP

## 2018-02-16 LAB — BMP8+EGFR
BUN/Creatinine Ratio: 15 (ref 12–28)
BUN: 12 mg/dL (ref 8–27)
CALCIUM: 10 mg/dL (ref 8.7–10.3)
CHLORIDE: 98 mmol/L (ref 96–106)
CO2: 20 mmol/L (ref 20–29)
Creatinine, Ser: 0.78 mg/dL (ref 0.57–1.00)
GFR calc non Af Amer: 69 mL/min/{1.73_m2} (ref >59)
GFR, EST AFRICAN AMERICAN: 79 mL/min/{1.73_m2} (ref >59)
Glucose: 144 mg/dL — ABNORMAL HIGH (ref 65–99)
Potassium: 5.1 mmol/L (ref 3.5–5.2)
Sodium: 135 mmol/L (ref 134–144)

## 2018-02-18 ENCOUNTER — Ambulatory Visit (INDEPENDENT_AMBULATORY_CARE_PROVIDER_SITE_OTHER): Payer: Medicare Other

## 2018-02-18 ENCOUNTER — Encounter: Payer: Self-pay | Admitting: Family Medicine

## 2018-02-18 ENCOUNTER — Ambulatory Visit (INDEPENDENT_AMBULATORY_CARE_PROVIDER_SITE_OTHER): Payer: Medicare Other | Admitting: Family Medicine

## 2018-02-18 VITALS — BP 125/72 | HR 71 | Temp 97.9°F | Ht 60.0 in | Wt 165.0 lb

## 2018-02-18 DIAGNOSIS — M25552 Pain in left hip: Secondary | ICD-10-CM | POA: Diagnosis not present

## 2018-02-18 DIAGNOSIS — M1612 Unilateral primary osteoarthritis, left hip: Secondary | ICD-10-CM | POA: Diagnosis not present

## 2018-02-18 MED ORDER — METHYLPREDNISOLONE ACETATE 80 MG/ML IJ SUSP
60.0000 mg | Freq: Once | INTRAMUSCULAR | Status: AC
  Administered 2018-02-18: 60 mg via INTRA_ARTICULAR

## 2018-02-18 MED ORDER — POTASSIUM CHLORIDE ER 10 MEQ PO TBCR: EXTENDED_RELEASE_TABLET | ORAL | 3 refills | Status: DC

## 2018-02-18 MED ORDER — DILTIAZEM HCL ER COATED BEADS 180 MG PO CP24: ORAL_CAPSULE | ORAL | 3 refills | Status: DC

## 2018-02-18 MED ORDER — CEPHALEXIN 250 MG PO CAPS: 250.0000 mg | ORAL_CAPSULE | Freq: Every day | ORAL | 3 refills | Status: DC

## 2018-02-18 NOTE — Addendum Note (Signed)
Addended by: Zannie Cove on: 02/18/2018 02:02 PM   Modules accepted: Orders

## 2018-02-18 NOTE — Progress Notes (Signed)
Subjective:    Patient ID: Debra Phillips, female    DOB: September 06, 1928, 84 y.o.   MRN: [id number]  HPI Patient here today for left hip pain.  No recent injury.  Patient is currently getting physical therapy at home to help with her balance.  X-rays of the hip today showed narrowed joint spaces in both hips and in fact the right actually looks worse than the left.   Patient Active Problem List   Diagnosis Date Noted  . Hyponatremia 12/22/2017  . Dysuria 01/22/2017  . Pelvic prolapse 08/28/2016  . Tremor 01/29/2016  . Abnormality of gait 01/29/2016  . Type 2 diabetes mellitus with hyperlipidemia ([place]) 01/14/2016  . Age-related macular degeneration 07/13/2015  . Keratoconjunctivitis sicca of both eyes ([place]) 07/13/2015  . Meibomian gland dysfunction (MGD), bilateral, both upper and lower lids 07/13/2015  . Pseudophakia of both eyes 07/13/2015  . Osteoarthritis of lumbar spine 05/08/2015  . Spondylosis of lumbar region without myelopathy or radiculopathy 09/01/2014  . Diabetes type 2, controlled ([place]) 04/26/2014  . Vitamin D deficiency 04/26/2014  . Chronic cystitis 04/26/2014  . Atrophic vaginitis 04/26/2014  . Solitary pulmonary nodule 03/20/2014  . Chest pain 03/18/2014  . Osteoporotic compression fracture of spine with routine healing 03/08/2014  . PALPITATIONS 02/27/2010  . Coronary atherosclerosis 12/12/2009  . Aortic aneurysm of unspecified site without mention of rupture 06/27/2009  . DYSPNEA 06/27/2009  . DYSLIPIDEMIA 12/12/2008  . Essential hypertension 12/12/2008  . GERD 12/12/2008  . OSTEOARTHRITIS 12/12/2008   Outpatient Encounter Medications as of 02/18/2018  Medication Sig  . aspirin EC 81 MG tablet Take 81 mg by mouth daily.  . Calcium Carb-Cholecalciferol 600-500 MG-UNIT CAPS Take 1 capsule by mouth every morning.  . cephALEXin (KEFLEX) 250 MG capsule Take 250 mg by mouth at bedtime.  . cetirizine (ZYRTEC) 5 MG tablet TAKE 1 TABLET DAILY (Patient taking  differently: TAKE 1 TABLET DAILY AS NEEDED FOR ALLERGIES)  . Cholecalciferol 1000 UNITS tablet Take 1,000 Units by mouth daily.   Marland Kitchen diltiazem (CARDIZEM CD) 180 MG 24 hr capsule TAKE (1) CAPSULE DAILY  . ESTRACE VAGINAL 0.1 MG/GM vaginal cream Place 1 Applicatorful vaginally once a week.   . hydroxypropyl methylcellulose / hypromellose (ISOPTO TEARS / GONIOVISC) 2.5 % ophthalmic solution Place 1 drop into both eyes 3 (three) times daily as needed for dry eyes.  Marland Kitchen lisinopril (PRINIVIL,ZESTRIL) 40 MG tablet Take 1 tablet (40 mg total) by mouth daily.  . metFORMIN (GLUCOPHAGE) 500 MG tablet TAKE (1) TABLET TWICE A DAY.  . Multiple Vitamins-Minerals (ICAPS AREDS 2 PO) Take 1 capsule by mouth daily.   Marland Kitchen MYRBETRIQ 25 MG TB24 tablet Take 1 tablet (25 mg total) by mouth daily.  . nitroGLYCERIN (NITROSTAT) 0.4 MG SL tablet Place 1 tablet (0.4 mg total) under the tongue every 5 (five) minutes as needed for chest pain.  . Omega-3 Fatty Acids (FISH OIL) 1000 MG CAPS Take 1,000 mg by mouth every morning.   Marland Kitchen omeprazole (PRILOSEC) 40 MG capsule Take 1 capsule (40 mg total) by mouth 2 (two) times daily. (Patient taking differently: Take 40 mg by mouth every morning. )  . ONE TOUCH ULTRA TEST test strip CHECK BLOOD SUGAR ONCE A DAY  . potassium chloride (K-DUR) 10 MEQ tablet TAKE (1) TABLET DAILY AS DIRECTED. (Patient taking differently: TAKE (1) TABLET DAILY)  . pravastatin (PRAVACHOL) 40 MG tablet Take 1 tablet (40 mg total) by mouth daily.   No facility-administered encounter medications on file as of  02/18/2018.       Review of Systems  Constitutional: Negative.   HENT: Negative.   Eyes: Negative.   Respiratory: Negative.   Cardiovascular: Positive for leg swelling.  Gastrointestinal: Negative.   Endocrine: Negative.   Genitourinary: Negative.   Musculoskeletal: Positive for arthralgias (left hip pain).  Skin: Negative.   Allergic/Immunologic: Negative.   Neurological: Negative.   Hematological:  Negative.   Psychiatric/Behavioral: Negative.        Objective:   Physical Exam BP 125/72 (BP Location: Left Arm)   Pulse 71   Temp 97.9 F (36.6 C) (Oral)   Ht 5' (1.524 m)   Wt 165 lb (74.8 kg)   BMI 32.22 kg/m   60 mg of Depo-Medrol with 1 cc of Marcaine injected into point tender area of left hip after sterilizing and cleansing with Betadine solution.  Patient tolerated the procedure well.      Assessment & Plan:  1. Left hip pain -60 mg of Depo-Medrol with 1 cc of Marcaine injected after sterile prep to left lateral hip and point tenderness area. - DG HIP UNILAT W OR W/O PELVIS 2-3 VIEWS LEFT; Future  Patient Instructions  Continue to be careful and do not put yourself at risk for falling Use cane or walker as much as possible  Call back the first of the week to let us know if your hip feels any better We will call with the official reading of the x-ray as soon as we receive that from the radiologist  Arrie Senate MD

## 2018-02-18 NOTE — Patient Instructions (Signed)
Continue to be careful and do not put yourself at risk for falling Use cane or walker as much as possible  Call back the first of the week to let us know if your hip feels any better We will call with the official reading of the x-ray as soon as we receive that from the radiologist

## 2018-02-27 ENCOUNTER — Other Ambulatory Visit: Payer: Self-pay | Admitting: Family Medicine

## 2018-03-08 NOTE — Progress Notes (Signed)
No since I saw you letter surgery is just a regular follow-up her was somewhat he had problems   HPI The patient presents for followup of chest pain.   She had a negative a Lexiscan Myoview May prior to bladder surgery.  She was in the hospital in April and there was a discussion of her having chest pain.  She was admitted after she fell and had a head laceration.  I looked through these notes and there is no suggestion that she had syncope.  It was thought to be a mechanical fall though she cannot really describe the event.  She has lightheaded and lots of somatic complaints to include dyspnea and weakness that have been chronic.  She is limited by back pain which she tries to do activities although her family says she is not overly active.  Reports lightheadedness but she is not describing orthostatic symptoms.  She is not describing any further presyncope or syncopal episodes.  During her hospitalization she had an echocardiogram which was essentially unremarkable.  Perfusion imaging demonstrated no ischemia and there was no suggestion of arrhythmia.  Allergies  Allergen Reactions  . Codeine Nausea And Vomiting  . Doxycycline Hives  . Other Nausea Only    Almost all antibiotics  . Sulfonamide Derivatives Hives  . Vancomycin Itching    Current Outpatient Medications  Medication Sig Dispense Refill  . aspirin EC 81 MG tablet Take 81 mg by mouth daily.    . Calcium Carb-Cholecalciferol 600-500 MG-UNIT CAPS Take 1 capsule by mouth every morning.    . cephALEXin (KEFLEX) 250 MG capsule Take 1 capsule (250 mg total) by mouth at bedtime. 90 capsule 3  . cetirizine (ZYRTEC) 5 MG tablet TAKE 1 TABLET DAILY 90 tablet 1  . Cholecalciferol 1000 UNITS tablet Take 1,000 Units by mouth daily.     Marland Kitchen diltiazem (CARDIZEM CD) 180 MG 24 hr capsule TAKE (1) CAPSULE DAILY 90 capsule 3  . ESTRACE VAGINAL 0.1 MG/GM vaginal cream Place 1 Applicatorful vaginally once a week.     . hydroxypropyl methylcellulose /  hypromellose (ISOPTO TEARS / GONIOVISC) 2.5 % ophthalmic solution Place 1 drop into both eyes 3 (three) times daily as needed for dry eyes.    Marland Kitchen lisinopril (PRINIVIL,ZESTRIL) 40 MG tablet Take 1 tablet (40 mg total) by mouth daily. 30 tablet 5  . metFORMIN (GLUCOPHAGE) 500 MG tablet TAKE (1) TABLET TWICE A DAY. 180 tablet 3  . Multiple Vitamins-Minerals (ICAPS AREDS 2 PO) Take 1 capsule by mouth daily.     Marland Kitchen MYRBETRIQ 25 MG TB24 tablet Take 1 tablet (25 mg total) by mouth daily. 90 tablet 3  . nitroGLYCERIN (NITROSTAT) 0.4 MG SL tablet Place 1 tablet (0.4 mg total) under the tongue every 5 (five) minutes as needed for chest pain. 25 tablet 3  . Omega-3 Fatty Acids (FISH OIL) 1000 MG CAPS Take 1,000 mg by mouth every morning.     Marland Kitchen omeprazole (PRILOSEC) 40 MG capsule Take 1 capsule (40 mg total) by mouth 2 (two) times daily. (Patient taking differently: Take 40 mg by mouth every morning. ) 60 capsule 5  . potassium chloride (K-DUR) 10 MEQ tablet TAKE (1) TABLET DAILY AS DIRECTED. 30 tablet 5  . pravastatin (PRAVACHOL) 40 MG tablet Take 1 tablet (40 mg total) by mouth daily. 90 tablet 3  . ONE TOUCH ULTRA TEST test strip CHECK BLOOD SUGAR ONCE A DAY 50 each 5   No current facility-administered medications for this visit.  Past Medical History:  Diagnosis Date  . Abnormality of gait 01/29/2016  . Aortic root dilatation (HCC)    Mild  . Cataract   . Diabetes mellitus without complication ([place])    type 2  . Dyslipidemia   . Dyspnea    with exertion  . Dysrhythmia    hx of occ palpitations  . Esophageal stricture    s/p dilation  . GERD (gastroesophageal reflux disease)   . Headache   . History of hiatal hernia   . Hyperlipidemia   . Hypertension   . Macular pucker, left eye   . Osteoarthritis   . Osteoporosis   . Tinnitus    left ear last week or so    Past Surgical History:  Procedure Laterality Date  . ABDOMINAL HYSTERECTOMY     partial  . APPENDECTOMY    . BACK SURGERY      lower x 1  . CARDIAC CATHETERIZATION  2010   both cataracts  . CHOLECYSTECTOMY  1993  . CYSTOSCOPY N/A 08/28/2016   Procedure: CYSTOSCOPY;  Surgeon: Carolan Clines, MD;  Location: [facility];  Service: Urology;  Laterality: N/A;  . EYE SURGERY     cataracts  . EYE SURGERY     for macular pucker  . FRACTURE SURGERY     rt wrist2010  . HARDWARE REMOVAL  12/10/2011   Procedure: HARDWARE REMOVAL;  Surgeon: Schuyler Amor, MD;  Location: [facility];  Service: Orthopedics;  Laterality: Right;  radial head  . KNEE ARTHROSCOPY  2004   Left and right  . KYPHOSIS SURGERY    . LUMBAR FUSION  [id number]  . RADIAL HEAD ARTHROPLASTY  5/12   orif rt radial head  . SHOULDER SURGERY     Right rotator cuff epair  . TOTAL KNEE ARTHROPLASTY     Left  . VAGINAL PROLAPSE REPAIR N/A 08/28/2016   Procedure: ANTERIOR VAGINAL VAULT SUSPENSION WITH SACROSPINOUS FIXATION;  Surgeon: Carolan Clines, MD;  Location: [facility];  Service: Urology;  Laterality: N/A;    ROS:   Positive for tiredness, weakness, shortness of breath, back pain, headaches.   Otherwise as stated in the HPI and negative for all other systems.  PHYSICAL EXAM BP 122/82   Pulse 84   Ht 5' (1.524 m)   Wt 162 lb (73.5 kg)   BMI 31.64 kg/m   GENERAL:  Well appearing NECK:  No jugular venous distention, waveform within normal limits, carotid upstroke brisk and symmetric, no bruits, no thyromegaly LUNGS:  Clear to auscultation bilaterally CHEST:  Unremarkable HEART:  PMI not displaced or sustained,S1 and S2 within normal limits, no S3, no S4, no clicks, no rubs, no murmurs ABD:  Flat, positive bowel sounds normal in frequency in pitch, no bruits, no rebound, no guarding, no midline pulsatile mass, no hepatomegaly, no splenomegaly EXT:  2 plus pulses throughout, trace edema, no cyanosis no clubbing   EKG:  Sinus rhythm, rate 75, left axis deviation, LAD , QT borderline.   03/10/2018 12/21/17   ASSESSMENT AND  PLAN   AORTIC ANEURYSM:  She had a mildly dilated aortic root years ago but this was not seen in follow-up with her last CT in 2015.   No further imaging is indicated.  HTN:  Her blood pressure is controlled.  No change in therapy.    EDEMA:   This is improved.  No change in therapy.   FALL: I reviewed the records extensively.  Is not possible to understand whether  she had fall or syncope although at the time people listed as a fall and I will not question this.  Given the absence of suggestive symptoms I do not think further event monitoring would be helpful unless she has recurrent symptoms.  I explained that to her and her family and they will let me know.  FATIGUE: She is due to have lab work soon apparently by Dr. Laurance Flatten and we will add a TSH to this as it is not been checked in a couple of years.  SOB:   This has been chronic.  Work-up has thus far been negative.  No further cardiac work-up is suggested.

## 2018-03-10 ENCOUNTER — Ambulatory Visit (INDEPENDENT_AMBULATORY_CARE_PROVIDER_SITE_OTHER): Payer: Medicare Other | Admitting: Cardiology

## 2018-03-10 ENCOUNTER — Encounter: Payer: Self-pay | Admitting: Cardiology

## 2018-03-10 ENCOUNTER — Encounter

## 2018-03-10 VITALS — BP 122/82 | HR 84 | Ht 60.0 in | Wt 162.0 lb

## 2018-03-10 DIAGNOSIS — R5383 Other fatigue: Secondary | ICD-10-CM

## 2018-03-10 DIAGNOSIS — R079 Chest pain, unspecified: Secondary | ICD-10-CM

## 2018-03-10 NOTE — Progress Notes (Signed)
Please make sure that thyroid profile is drawn at next visit to [facility].

## 2018-03-10 NOTE — Patient Instructions (Signed)
Medication Instructions:  The current medical regimen is effective;  continue present plan and medications.  Labwork: Please have TSH drawn with your next blood work at [facility].  Follow-Up: As needed.  Thank you for choosing [facility]!!

## 2018-03-11 NOTE — Progress Notes (Signed)
Noted in chart.

## 2018-03-22 DIAGNOSIS — Z0289 Encounter for other administrative examinations: Secondary | ICD-10-CM

## 2018-03-25 NOTE — Progress Notes (Signed)
Ordered in Epic.

## 2018-03-30 NOTE — Progress Notes (Signed)
In Care Everywhere Cat 1 

## 2018-04-06 ENCOUNTER — Ambulatory Visit (INDEPENDENT_AMBULATORY_CARE_PROVIDER_SITE_OTHER): Payer: Medicare Other | Admitting: Family Medicine

## 2018-04-06 ENCOUNTER — Encounter: Payer: Self-pay | Admitting: Family Medicine

## 2018-04-06 VITALS — BP 143/76 | HR 69 | Temp 96.8°F | Ht 60.0 in | Wt 164.0 lb

## 2018-04-06 DIAGNOSIS — E1169 Type 2 diabetes mellitus with other specified complication: Secondary | ICD-10-CM | POA: Diagnosis not present

## 2018-04-06 DIAGNOSIS — K219 Gastro-esophageal reflux disease without esophagitis: Secondary | ICD-10-CM | POA: Diagnosis not present

## 2018-04-06 DIAGNOSIS — E785 Hyperlipidemia, unspecified: Secondary | ICD-10-CM | POA: Diagnosis not present

## 2018-04-06 DIAGNOSIS — I10 Essential (primary) hypertension: Secondary | ICD-10-CM

## 2018-04-06 DIAGNOSIS — R531 Weakness: Secondary | ICD-10-CM | POA: Diagnosis not present

## 2018-04-06 DIAGNOSIS — E119 Type 2 diabetes mellitus without complications: Secondary | ICD-10-CM

## 2018-04-06 DIAGNOSIS — R102 Pelvic and perineal pain: Secondary | ICD-10-CM | POA: Diagnosis not present

## 2018-04-06 DIAGNOSIS — M48061 Spinal stenosis, lumbar region without neurogenic claudication: Secondary | ICD-10-CM | POA: Diagnosis not present

## 2018-04-06 DIAGNOSIS — R5383 Other fatigue: Secondary | ICD-10-CM

## 2018-04-06 DIAGNOSIS — E559 Vitamin D deficiency, unspecified: Secondary | ICD-10-CM

## 2018-04-06 LAB — URINALYSIS, COMPLETE
Bilirubin, UA: NEGATIVE
Glucose, UA: NEGATIVE
Ketones, UA: NEGATIVE
Leukocytes, UA: NEGATIVE
Nitrite, UA: NEGATIVE
PH UA: 7.5 (ref 5.0–7.5)
Protein, UA: NEGATIVE
RBC, UA: NEGATIVE
Specific Gravity, UA: 1.015 (ref 1.005–1.030)
Urobilinogen, Ur: 0.2 mg/dL (ref 0.2–1.0)

## 2018-04-06 LAB — BAYER DCA HB A1C WAIVED: HB A1C (BAYER DCA - WAIVED): 7.6 % — ABNORMAL HIGH (ref <7.0)

## 2018-04-06 LAB — MICROSCOPIC EXAMINATION
Epithelial Cells (non renal): >10 /hpf — AB (ref 0–10)
RBC MICROSCOPIC, UA: NONE SEEN /HPF (ref 0–2)
RENAL EPITHEL UA: NONE SEEN /HPF

## 2018-04-06 NOTE — Progress Notes (Signed)
Subjective:    Patient ID: Debra Phillips, female    DOB: 06/20/1929, 84 y.o.   MRN: [id number]  HPI Pt here for follow up and management of chronic medical problems which includes diabetes and hypertension. She Is taking medication regularly.  The patient is doing well overall.  She complains of increased hoarseness recently and says she is drinking lots of water.  She says she has had some cold symptoms but no cough.  Fatigue.  The sputum does not have color and she has not had any fever.  She denies chest pain or shortness of breath anymore than usual.  She denies any cough.  She denies any trouble with her intestinal track and is trying to drink plenty of water.  She does have some heartburn and she is supposed to be taking omeprazole twice a day but is only been taking it once a day because she forgets to take it in the evening.  I did encourage her to take this twice a day as directed.  She is taking an antibiotic prophylactically for a urinary tract infection.  Her recent visit to the cardiology was reviewed and he felt things were good as far as her cardiac status was concerned.  She does not use an overhead fan.    Patient Active Problem List   Diagnosis Date Noted  . Hyponatremia 12/22/2017  . Dysuria 01/22/2017  . Pelvic prolapse 08/28/2016  . Tremor 01/29/2016  . Abnormality of gait 01/29/2016  . Type 2 diabetes mellitus with hyperlipidemia ([place]) 01/14/2016  . Age-related macular degeneration 07/13/2015  . Keratoconjunctivitis sicca of both eyes ([place]) 07/13/2015  . Meibomian gland dysfunction (MGD), bilateral, both upper and lower lids 07/13/2015  . Pseudophakia of both eyes 07/13/2015  . Osteoarthritis of lumbar spine 05/08/2015  . Spondylosis of lumbar region without myelopathy or radiculopathy 09/01/2014  . Diabetes type 2, controlled ([place]) 04/26/2014  . Vitamin D deficiency 04/26/2014  . Chronic cystitis 04/26/2014  . Atrophic vaginitis 04/26/2014  . Solitary pulmonary  nodule 03/20/2014  . Chest pain 03/18/2014  . Osteoporotic compression fracture of spine with routine healing 03/08/2014  . PALPITATIONS 02/27/2010  . Coronary atherosclerosis 12/12/2009  . Aortic aneurysm of unspecified site without mention of rupture 06/27/2009  . DYSPNEA 06/27/2009  . DYSLIPIDEMIA 12/12/2008  . Essential hypertension 12/12/2008  . GERD 12/12/2008  . OSTEOARTHRITIS 12/12/2008   Outpatient Encounter Medications as of 04/06/2018  Medication Sig  . aspirin EC 81 MG tablet Take 81 mg by mouth daily.  . Calcium Carb-Cholecalciferol 600-500 MG-UNIT CAPS Take 1 capsule by mouth every morning.  . cephALEXin (KEFLEX) 250 MG capsule Take 1 capsule (250 mg total) by mouth at bedtime.  . cetirizine (ZYRTEC) 5 MG tablet TAKE 1 TABLET DAILY  . Cholecalciferol 1000 UNITS tablet Take 1,000 Units by mouth daily.   Marland Kitchen diltiazem (CARDIZEM CD) 180 MG 24 hr capsule TAKE (1) CAPSULE DAILY  . ESTRACE VAGINAL 0.1 MG/GM vaginal cream Place 1 Applicatorful vaginally once a week.   . hydroxypropyl methylcellulose / hypromellose (ISOPTO TEARS / GONIOVISC) 2.5 % ophthalmic solution Place 1 drop into both eyes 3 (three) times daily as needed for dry eyes.  Marland Kitchen lisinopril (PRINIVIL,ZESTRIL) 40 MG tablet Take 1 tablet (40 mg total) by mouth daily.  . metFORMIN (GLUCOPHAGE) 500 MG tablet TAKE (1) TABLET TWICE A DAY.  . Multiple Vitamins-Minerals (ICAPS AREDS 2 PO) Take 1 capsule by mouth daily.   Marland Kitchen MYRBETRIQ 25 MG TB24 tablet Take 1 tablet (25  mg total) by mouth daily.  . nitroGLYCERIN (NITROSTAT) 0.4 MG SL tablet Place 1 tablet (0.4 mg total) under the tongue every 5 (five) minutes as needed for chest pain.  . Omega-3 Fatty Acids (FISH OIL) 1000 MG CAPS Take 1,000 mg by mouth every morning.   Marland Kitchen omeprazole (PRILOSEC) 40 MG capsule Take 1 capsule (40 mg total) by mouth 2 (two) times daily. (Patient taking differently: Take 40 mg by mouth every morning. )  . ONE TOUCH ULTRA TEST test strip CHECK BLOOD SUGAR  ONCE A DAY  . potassium chloride (K-DUR) 10 MEQ tablet TAKE (1) TABLET DAILY AS DIRECTED.  Marland Kitchen pravastatin (PRAVACHOL) 40 MG tablet Take 1 tablet (40 mg total) by mouth daily.   No facility-administered encounter medications on file as of 04/06/2018.       Review of Systems  Constitutional: Positive for fatigue.  HENT: Positive for voice change (hoarse).   Eyes: Negative.   Respiratory: Negative.   Cardiovascular: Negative.   Gastrointestinal: Negative.   Endocrine: Negative.   Genitourinary: Negative.   Musculoskeletal: Negative.   Skin: Negative.   Allergic/Immunologic: Negative.   Neurological: Negative.   Hematological: Negative.   Psychiatric/Behavioral: Negative.        Objective:   Physical Exam  Constitutional: She is oriented to person, place, and time. She appears well-developed and well-nourished. No distress.  Patient is pleasant and smiling and doing well and with it mentally for her age of 84 years.  HENT:  Head: Normocephalic and atraumatic.  Right Ear: External ear normal.  Left Ear: External ear normal.  Mouth/Throat: Oropharynx is clear and moist. No oropharyngeal exudate.  Nasal congestion and turbinate swelling bilaterally and throat was normal.  Eyes: Pupils are equal, round, and reactive to light. Conjunctivae and EOM are normal. Right eye exhibits no discharge. Left eye exhibits no discharge. No scleral icterus.  Neck: Normal range of motion. Neck supple. No thyromegaly present.  No anterior cervical adenopathy bruits or thyroid enlargement  Cardiovascular: Normal rate, regular rhythm, normal heart sounds and intact distal pulses.  No murmur heard. Heart is regular at 60/min with good pedal pulses and no edema  Pulmonary/Chest: Effort normal and breath sounds normal. She has no wheezes. She has no rales.  Clear anteriorly and posteriorly  Abdominal: Soft. Bowel sounds are normal. She exhibits no mass. There is tenderness. There is no guarding.  Some  generalized abdominal tenderness both epigastric and suprapubic.  No spleen or liver enlargement no masses no bruits and no inguinal adenopathy  Musculoskeletal: She exhibits deformity. She exhibits no edema or tenderness.  Patient is kyphotic and has a somewhat forward leaning posture.  Lymphadenopathy:    She has no cervical adenopathy.  Neurological: She is alert and oriented to person, place, and time. She has normal reflexes. No cranial nerve deficit.  Reflexes are equal bilaterally  Skin: Skin is warm and dry. No rash noted. There is erythema.  Mild redness of the skin of both lower extremities without edema  Psychiatric: She has a normal mood and affect. Her behavior is normal. Judgment and thought content normal.  Mood affect and behavior are normal for this patient  Nursing note and vitals reviewed.  BP (!) 143/76 (BP Location: Left Arm)   Pulse 69   Temp (!) 96.8 F (36 C) (Oral)   Ht 5' (1.524 m)   Wt 164 lb (74.4 kg)   BMI 32.03 kg/m         Assessment & Plan:  1.  Essential hypertension -The blood pressure is good for this 84 year old patient no changes will be made today. - BMP8+EGFR - CBC with Differential/Platelet - Lipid panel - Hepatic function panel  2. Controlled type 2 diabetes mellitus without complication, without long-term current use of insulin ([place]) -Continue current treatment pending results of lab work - CBC with Differential/Platelet - Lipid panel - Bayer DCA Hb A1c Waived  3. Vitamin D deficiency -Continue with vitamin D and calcium replacement pending results of lab work - CBC with Differential/Platelet - VITAMIN D 25 Hydroxy (Vit-D Deficiency, Fractures)  4. Gastroesophageal reflux disease, esophagitis presence not specified -The patient is supposed to have been taking her omeprazole twice daily but has only been taking it once daily.  She will increase this again back to twice daily. - CBC with Differential/Platelet - Hepatic function  panel  5. Other fatigue - CBC with Differential/Platelet - Thyroid Panel With TSH - Urine Culture - Urinalysis, Complete  6. Pelvic pressure in female -Follow-up with urology as planned - Urine Culture - Urinalysis, Complete  7. Spinal stenosis of lumbar region, unspecified whether neurogenic claudication present -Follow-up with neurosurgery as planned  8. General weakness -Check general labs  9. Type 2 diabetes mellitus with hyperlipidemia ([place]) -Continue current treatment pending results of lab work  Patient Instructions                       Medicare Annual Wellness Visit  [facility] and the medical providers at [facility] strive to bring you the best medical care.  In doing so we not only want to address your current medical conditions and concerns but also to detect new conditions early and prevent illness, disease and health-related problems.    Medicare offers a yearly Wellness Visit which allows our clinical staff to assess your need for preventative services including immunizations, lifestyle education, counseling to decrease risk of preventable diseases and screening for fall risk and other medical concerns.    This visit is provided free of charge (no copay) for all Medicare recipients. The clinical pharmacists at [facility] have begun to conduct these Wellness Visits which will also include a thorough review of all your medications.    As you primary medical provider recommend that you make an appointment for your Annual Wellness Visit if you have not done so already this year.  You may set up this appointment before you leave today or you may call back ([phone]) and schedule an appointment.  Please make sure when you call that you mention that you are scheduling your Annual Wellness Visit with the clinical pharmacist so that the appointment may be made for the proper length of time.     Continue current  medications. Continue good therapeutic lifestyle changes which include good diet and exercise. Fall precautions discussed with patient. If an FOBT was given today- please return it to our front desk. If you are over 85 years old - you may need Prevnar 90 or the adult Pneumonia vaccine.  **Flu shots are available--- please call and schedule a FLU-CLINIC appointment**  After your visit with [place] today you will receive a survey in the mail or online from [employer] regarding your care with [place]. Please take a moment to fill this out. Your feedback is very important to [place] as you can help [place] better understand your patient needs as well as improve your experience and satisfaction. WE CARE ABOUT YOU!!!   Drink more  water Take the Mucinex over-the-counter, blue and white in color, plain and take 1 twice daily with a large glass of water Use nasal saline at least 4 times daily in each nostril Check blood sugars periodically at home and eat healthy  Arrie Senate MD

## 2018-04-06 NOTE — Patient Instructions (Addendum)
Medicare Annual Wellness Visit  [facility] and the medical providers at [facility] strive to bring you the best medical care.  In doing so we not only want to address your current medical conditions and concerns but also to detect new conditions early and prevent illness, disease and health-related problems.    Medicare offers a yearly Wellness Visit which allows our clinical staff to assess your need for preventative services including immunizations, lifestyle education, counseling to decrease risk of preventable diseases and screening for fall risk and other medical concerns.    This visit is provided free of charge (no copay) for all Medicare recipients. The clinical pharmacists at [facility] have begun to conduct these Wellness Visits which will also include a thorough review of all your medications.    As you primary medical provider recommend that you make an appointment for your Annual Wellness Visit if you have not done so already this year.  You may set up this appointment before you leave today or you may call back ([phone]) and schedule an appointment.  Please make sure when you call that you mention that you are scheduling your Annual Wellness Visit with the clinical pharmacist so that the appointment may be made for the proper length of time.     Continue current medications. Continue good therapeutic lifestyle changes which include good diet and exercise. Fall precautions discussed with patient. If an FOBT was given today- please return it to our front desk. If you are over 90 years old - you may need Prevnar 52 or the adult Pneumonia vaccine.  **Flu shots are available--- please call and schedule a FLU-CLINIC appointment**  After your visit with [place] today you will receive a survey in the mail or online from [employer] regarding your care with [place]. Please take a moment to fill this out. Your feedback is very  important to [place] as you can help [place] better understand your patient needs as well as improve your experience and satisfaction. WE CARE ABOUT YOU!!!   Drink more water Take the Mucinex over-the-counter, blue and white in color, plain and take 1 twice daily with a large glass of water Use nasal saline at least 4 times daily in each nostril Check blood sugars periodically at home and eat healthy

## 2018-04-07 LAB — CBC WITH DIFFERENTIAL/PLATELET
BASOS: 0 %
Basophils Absolute: 0 10*3/uL (ref 0.0–0.2)
EOS (ABSOLUTE): 0.1 10*3/uL (ref 0.0–0.4)
EOS: 1 %
HEMOGLOBIN: 12.5 g/dL (ref 11.1–15.9)
Hematocrit: 39.4 % (ref 34.0–46.6)
IMMATURE GRANULOCYTES: 0 %
Immature Grans (Abs): 0 10*3/uL (ref 0.0–0.1)
LYMPHS ABS: 2.3 10*3/uL (ref 0.7–3.1)
Lymphs: 33 %
MCH: 30.3 pg (ref 26.6–33.0)
MCHC: 31.7 g/dL (ref 31.5–35.7)
MCV: 96 fL (ref 79–97)
Monocytes Absolute: 0.5 10*3/uL (ref 0.1–0.9)
Monocytes: 7 %
NEUTROS ABS: 4.1 10*3/uL (ref 1.4–7.0)
NEUTROS PCT: 59 %
PLATELETS: 316 10*3/uL (ref 150–450)
RBC: 4.12 x10E6/uL (ref 3.77–5.28)
RDW: 14.2 % (ref 12.3–15.4)
WBC: 7 10*3/uL (ref 3.4–10.8)

## 2018-04-07 LAB — BMP8+EGFR
BUN / CREAT RATIO: 12 (ref 12–28)
BUN: 9 mg/dL (ref 8–27)
CO2: 24 mmol/L (ref 20–29)
CREATININE: 0.77 mg/dL (ref 0.57–1.00)
Calcium: 9.4 mg/dL (ref 8.7–10.3)
Chloride: 99 mmol/L (ref 96–106)
GFR, EST AFRICAN AMERICAN: 80 mL/min/{1.73_m2} (ref >59)
GFR, EST NON AFRICAN AMERICAN: 70 mL/min/{1.73_m2} (ref >59)
Glucose: 175 mg/dL — ABNORMAL HIGH (ref 65–99)
POTASSIUM: 4.8 mmol/L (ref 3.5–5.2)
SODIUM: 138 mmol/L (ref 134–144)

## 2018-04-07 LAB — LIPID PANEL
Chol/HDL Ratio: 3.4 ratio (ref 0.0–4.4)
Cholesterol, Total: 161 mg/dL (ref 100–199)
HDL: 47 mg/dL (ref >39)
LDL CALC: 76 mg/dL (ref 0–99)
Triglycerides: 190 mg/dL — ABNORMAL HIGH (ref 0–149)
VLDL CHOLESTEROL CAL: 38 mg/dL (ref 5–40)

## 2018-04-07 LAB — THYROID PANEL WITH TSH
Free Thyroxine Index: 1.8 (ref 1.2–4.9)
T3 Uptake Ratio: 24 % (ref 24–39)
T4 TOTAL: 7.6 ug/dL (ref 4.5–12.0)
TSH: 1.83 u[IU]/mL (ref 0.450–4.500)

## 2018-04-07 LAB — HEPATIC FUNCTION PANEL
ALK PHOS: 60 IU/L (ref 39–117)
ALT: 17 IU/L (ref 0–32)
AST: 18 IU/L (ref 0–40)
Albumin: 4.4 g/dL (ref 3.5–4.7)
BILIRUBIN TOTAL: 0.5 mg/dL (ref 0.0–1.2)
BILIRUBIN, DIRECT: 0.12 mg/dL (ref 0.00–0.40)
Total Protein: 6.4 g/dL (ref 6.0–8.5)

## 2018-04-07 LAB — VITAMIN D 25 HYDROXY (VIT D DEFICIENCY, FRACTURES): VIT D 25 HYDROXY: 51.6 ng/mL (ref 30.0–100.0)

## 2018-04-08 LAB — URINE CULTURE

## 2018-04-16 ENCOUNTER — Encounter: Payer: Self-pay | Admitting: Nurse Practitioner

## 2018-04-16 ENCOUNTER — Ambulatory Visit (INDEPENDENT_AMBULATORY_CARE_PROVIDER_SITE_OTHER): Payer: Medicare Other | Admitting: Nurse Practitioner

## 2018-04-16 ENCOUNTER — Ambulatory Visit (INDEPENDENT_AMBULATORY_CARE_PROVIDER_SITE_OTHER): Payer: Medicare Other

## 2018-04-16 VITALS — BP 138/86 | HR 84 | Temp 97.8°F | Ht 61.0 in | Wt 164.0 lb

## 2018-04-16 DIAGNOSIS — S0093XA Contusion of unspecified part of head, initial encounter: Secondary | ICD-10-CM | POA: Diagnosis not present

## 2018-04-16 DIAGNOSIS — S0511XA Contusion of eyeball and orbital tissues, right eye, initial encounter: Secondary | ICD-10-CM | POA: Diagnosis not present

## 2018-04-16 DIAGNOSIS — S0083XA Contusion of other part of head, initial encounter: Secondary | ICD-10-CM | POA: Diagnosis not present

## 2018-04-16 DIAGNOSIS — S0512XA Contusion of eyeball and orbital tissues, left eye, initial encounter: Secondary | ICD-10-CM

## 2018-04-16 NOTE — Progress Notes (Signed)
   Subjective:    Patient ID: Debra Phillips, female    DOB: 15-Jan-1929, 84 y.o.   MRN: [id number]   Chief Complaint: facial injury  HPI Patient said that she went to [employer] wednesday evening to get groceries. When she came out it was pouriing down rain. So she wa sin a hurry and when she opened her door she hit the center of her forehead on corner of door. It did not knock her out. She has headache all the time so not sure if headache is from where she hit her head or not. Says that headache is slightly worse then normal. denies any visualproblems, nauseaor vomiting.    Review of Systems  Constitutional: Negative.   HENT: Negative.   Eyes: Negative for visual disturbance.  Respiratory: Negative.   Cardiovascular: Negative.   Gastrointestinal: Negative.   Neurological: Positive for headaches. Negative for dizziness, seizures, syncope and speech difficulty.  Psychiatric/Behavioral: Negative.   All other systems reviewed and are negative.      Objective:   Physical Exam  Constitutional: She is oriented to person, place, and time. She appears well-developed and well-nourished.  Eyes: Pupils are equal, round, and reactive to light. EOM are normal.  Contusion around both orbits.  Cardiovascular: Normal rate.  Pulmonary/Chest: Effort normal.  Neurological: She is alert and oriented to person, place, and time. She displays normal reflexes. No cranial nerve deficit. Coordination normal.  Skin: Skin is warm and dry.  Psychiatric: She has a normal mood and affect. Her behavior is normal. Thought content normal.   BP 138/86   Pulse 84   Temp 97.8 F (36.6 C) (Oral)   Ht 5\' 1"  (1.549 m)   Wt 164 lb (74.4 kg)   BMI 30.99 kg/m           Assessment & Plan:  Debra Phillips in today with chief complaint of No chief complaint on file.   1. Contusion of face, initial encounter - DG Orbits; Future  2. Periorbital contusion of left eye, initial encounter  3. Periorbital  contusion of right eye, initial encounter Ice to area bid Watch for signs of increased cranial pressure- to er if develop  Mary-Margaret Hassell Done, FNP

## 2018-04-16 NOTE — Patient Instructions (Signed)
Intracranial Pressure Monitoring Intracranial pressure is pressure in the skull. Intracranial pressure monitoring allows your health care provider to measure and observe the pressure and any pressure changes inside the skull. Monitoring happens after a brain injury that causes the brain to swell. It helps to guide treatment. Monitoring happens at the hospital, often in the intensive care unit (ICU). How is intracranial pressure monitored? There are three main ways to check intracranial pressure:  Putting a thin, flexible tube (catheter) through a drilled hole into one of the fluid-filled spaces (ventricles) in the brain.  Putting a small pressure sensor inside a drilled hole in the skull.  Putting a hollow screw or bolt in the space between the brain, its coverings, and the skull.  What are the risks? Generally, intracranial pressure monitoring is safe. However, problems may occur, depending on the type of procedure you have:  Infection.  Bleeding.  Allergic reaction to medicines.  Damage to other structures or organs.  Procedure failure. This means that your health care provider is not able to complete the procedure, or the procedure does not work.  Brain tissue injury that causes lasting problems.  What happens if intracranial pressure is high? An increase in intracranial pressure can be life-threatening. If pressure is high, the brain may not work as well as it did before your injury. If intracranial pressure rises higher than your blood pressure, blood will stop flowing to the brain. This can cause brain tissue death. If intracranial pressure is high, your health care provider can drain the fluid around the brain (cerebrospinal fluid). If the catheter or screw or bolt method is used, the fluid can be removed during monitoring. Summary  Intracranial pressure monitoring happens after a brain injury that causes the brain to swell. It helps to guide treatment.  There are three main  ways to check intracranial pressure, and they involve a thin, flexible tube (catheter), a pressure sensor, or a hollow screw or bolt.  An increase in intracranial pressure can be life-threatening. This information is not intended to replace advice given to you by your health care provider. Make sure you discuss any questions you have with your health care provider. Document Released: 11/10/2005 Document Revised: 07/04/2016 Document Reviewed: 07/04/2016 Elsevier Interactive Patient Education  2017 [employer].

## 2018-05-06 ENCOUNTER — Other Ambulatory Visit: Payer: Self-pay | Admitting: *Deleted

## 2018-05-06 ENCOUNTER — Telehealth: Payer: Self-pay | Admitting: Family Medicine

## 2018-05-06 DIAGNOSIS — R300 Dysuria: Secondary | ICD-10-CM

## 2018-05-06 NOTE — Telephone Encounter (Signed)
Yes patient can bring sample.

## 2018-05-06 NOTE — Telephone Encounter (Signed)
Patient aware and will come by and leave urine. Order placed.

## 2018-05-06 NOTE — Telephone Encounter (Signed)
Last office visit in august. Her provider is out of work until next Wednesday. Please advise if needs to be seen.

## 2018-05-07 ENCOUNTER — Other Ambulatory Visit: Payer: Medicare Other

## 2018-05-07 ENCOUNTER — Other Ambulatory Visit: Payer: Self-pay | Admitting: Family

## 2018-05-07 DIAGNOSIS — R300 Dysuria: Secondary | ICD-10-CM

## 2018-05-07 LAB — URINALYSIS, COMPLETE
BILIRUBIN UA: NEGATIVE
Glucose, UA: NEGATIVE
Ketones, UA: NEGATIVE
NITRITE UA: NEGATIVE
PH UA: 7 (ref 5.0–7.5)
Protein, UA: NEGATIVE
RBC, UA: NEGATIVE
Specific Gravity, UA: 1.015 (ref 1.005–1.030)
UUROB: 0.2 mg/dL (ref 0.2–1.0)

## 2018-05-07 LAB — MICROSCOPIC EXAMINATION: RENAL EPITHEL UA: NONE SEEN /HPF

## 2018-05-07 MED ORDER — FLUCONAZOLE 150 MG PO TABS: 150.0000 mg | ORAL_TABLET | ORAL | 0 refills | Status: DC | PRN

## 2018-05-11 ENCOUNTER — Other Ambulatory Visit: Payer: Self-pay | Admitting: Family

## 2018-05-11 LAB — URINE CULTURE

## 2018-05-11 MED ORDER — CIPROFLOXACIN HCL 500 MG PO TABS: 500.0000 mg | ORAL_TABLET | Freq: Two times a day (BID) | ORAL | 0 refills | Status: DC

## 2018-05-11 NOTE — Progress Notes (Signed)
c 

## 2018-05-12 ENCOUNTER — Telehealth: Payer: Self-pay | Admitting: Family Medicine

## 2018-05-12 NOTE — Telephone Encounter (Signed)
Pt notified ok to take meds Verbalizes understanding

## 2018-05-12 NOTE — Telephone Encounter (Signed)
States she needs a call back ASAP.

## 2018-06-29 ENCOUNTER — Ambulatory Visit (INDEPENDENT_AMBULATORY_CARE_PROVIDER_SITE_OTHER): Payer: Medicare Other | Admitting: *Deleted

## 2018-06-29 DIAGNOSIS — Z23 Encounter for immunization: Secondary | ICD-10-CM

## 2018-06-29 NOTE — Progress Notes (Signed)
Pt given flu vaccine Tolerated well 

## 2018-07-08 ENCOUNTER — Ambulatory Visit (INDEPENDENT_AMBULATORY_CARE_PROVIDER_SITE_OTHER): Payer: Medicare Other

## 2018-07-08 ENCOUNTER — Encounter: Payer: Self-pay | Admitting: Family Medicine

## 2018-07-08 ENCOUNTER — Ambulatory Visit (INDEPENDENT_AMBULATORY_CARE_PROVIDER_SITE_OTHER): Payer: Medicare Other | Admitting: Family Medicine

## 2018-07-08 VITALS — BP 124/81 | HR 78 | Temp 98.1°F | Ht 61.0 in | Wt 166.0 lb

## 2018-07-08 DIAGNOSIS — E119 Type 2 diabetes mellitus without complications: Secondary | ICD-10-CM | POA: Diagnosis not present

## 2018-07-08 DIAGNOSIS — R350 Frequency of micturition: Secondary | ICD-10-CM | POA: Diagnosis not present

## 2018-07-08 DIAGNOSIS — M25561 Pain in right knee: Secondary | ICD-10-CM

## 2018-07-08 DIAGNOSIS — M542 Cervicalgia: Secondary | ICD-10-CM

## 2018-07-08 DIAGNOSIS — G8929 Other chronic pain: Secondary | ICD-10-CM

## 2018-07-08 DIAGNOSIS — M25552 Pain in left hip: Secondary | ICD-10-CM

## 2018-07-08 DIAGNOSIS — Z471 Aftercare following joint replacement surgery: Secondary | ICD-10-CM | POA: Diagnosis not present

## 2018-07-08 DIAGNOSIS — Z96651 Presence of right artificial knee joint: Secondary | ICD-10-CM | POA: Diagnosis not present

## 2018-07-08 LAB — URINALYSIS, COMPLETE
Bilirubin, UA: NEGATIVE
GLUCOSE, UA: NEGATIVE
Ketones, UA: NEGATIVE
Leukocytes, UA: NEGATIVE
Nitrite, UA: NEGATIVE
Protein, UA: NEGATIVE
Specific Gravity, UA: 1.01 (ref 1.005–1.030)
UUROB: 0.2 mg/dL (ref 0.2–1.0)
pH, UA: 7 (ref 5.0–7.5)

## 2018-07-08 LAB — MICROSCOPIC EXAMINATION: RENAL EPITHEL UA: NONE SEEN /HPF

## 2018-07-08 LAB — BAYER DCA HB A1C WAIVED: HB A1C: 7.4 % — AB (ref <7.0)

## 2018-07-08 NOTE — Patient Instructions (Addendum)
Use warm wet compresses to the posterior neck 20 minutes 3 or 4 times daily Continue to take extra strength Tylenol ,2   3 or 4 times daily if needed for pain We will make arrangements for the patient to see the orthopedic surgeon to look at her left hip pain and her right knee pain. May be better placed injections can help control the  inflammation and therefore help the pain. We will call with the results of the labs including the BMP hemoglobin A1c and urinalysis as soon as those results become available

## 2018-07-08 NOTE — Progress Notes (Signed)
Subjective:    Patient ID: Debra Phillips, female    DOB: Mar 16, 1929, 84 y.o.   MRN: [id number]  HPI Patient here today for continued left hip pain, right knee pain and urinary frequency.  The patient has had x-rays of the hip on June 27 and this shows some mild left hip degenerative changes.  Also some degenerative changes involving the SI joints and symphysis pubis.  She had lumbar spine films and it was noted that there was no definite acute osseous abnormality and this x-ray was done on December 22, 2017.  She does complain of urinary frequency.  There is no burning.  The patient has had a history of surgery on her back and has had a long history of arthralgias in her hips and back.  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  She has had bilateral knee replacements.  She has had surgery on her back.  Today she complains of pain in her neck especially toward the left side pains in both knees but the right knee being worse than the left knee and pain in her low back and her left hip.   Patient Active Problem List   Diagnosis Date Noted  . Hyponatremia 12/22/2017  . Dysuria 01/22/2017  . Pelvic prolapse 08/28/2016  . Tremor 01/29/2016  . Abnormality of gait 01/29/2016  . Type 2 diabetes mellitus with hyperlipidemia ([place]) 01/14/2016  . Age-related macular degeneration 07/13/2015  . Keratoconjunctivitis sicca of both eyes ([place]) 07/13/2015  . Meibomian gland dysfunction (MGD), bilateral, both upper and lower lids 07/13/2015  . Pseudophakia of both eyes 07/13/2015  . Osteoarthritis of lumbar spine 05/08/2015  . Spondylosis of lumbar region without myelopathy or radiculopathy 09/01/2014  . Diabetes type 2, controlled ([place]) 04/26/2014  . Vitamin D deficiency 04/26/2014  . Chronic cystitis 04/26/2014  . Atrophic vaginitis 04/26/2014  . Solitary pulmonary nodule 03/20/2014  . Chest  pain 03/18/2014  . Osteoporotic compression fracture of spine with routine healing 03/08/2014  . PALPITATIONS 02/27/2010  . Coronary atherosclerosis 12/12/2009  . Aortic aneurysm of unspecified site without mention of rupture 06/27/2009  . DYSPNEA 06/27/2009  . DYSLIPIDEMIA 12/12/2008  . Essential hypertension 12/12/2008  . GERD 12/12/2008  . OSTEOARTHRITIS 12/12/2008   Outpatient Encounter Medications as of 07/08/2018  Medication Sig  . aspirin EC 81 MG tablet Take 81 mg by mouth daily.  . Calcium Carb-Cholecalciferol 600-500 MG-UNIT CAPS Take 1 capsule by mouth every morning.  . cephALEXin (KEFLEX) 250 MG capsule Take 1 capsule (250 mg total) by mouth at bedtime.  . Cholecalciferol 1000 UNITS tablet Take 1,000 Units by mouth daily.   Marland Kitchen diltiazem (CARDIZEM CD) 180 MG 24 hr capsule TAKE (1) CAPSULE DAILY  . ESTRACE VAGINAL 0.1 MG/GM vaginal cream Place 1 Applicatorful vaginally once a week.   . hydroxypropyl methylcellulose / hypromellose (ISOPTO TEARS / GONIOVISC) 2.5 % ophthalmic solution Place 1 drop into both eyes 3 (three) times daily as needed for dry eyes.  Marland Kitchen lisinopril (PRINIVIL,ZESTRIL) 40 MG tablet Take 1 tablet (40 mg total) by mouth daily.  . metFORMIN (GLUCOPHAGE) 500 MG tablet TAKE (1) TABLET TWICE A DAY.  . Multiple Vitamins-Minerals (ICAPS AREDS 2 PO) Take 1 capsule by mouth daily.   Marland Kitchen MYRBETRIQ 25 MG TB24 tablet Take 1 tablet (25 mg total) by mouth daily.  . Omega-3 Fatty Acids (FISH OIL) 1000  MG CAPS Take 1,000 mg by mouth every morning.   Marland Kitchen omeprazole (PRILOSEC) 40 MG capsule Take 1 capsule (40 mg total) by mouth 2 (two) times daily. (Patient taking differently: Take 40 mg by mouth every morning. )  . ONE TOUCH ULTRA TEST test strip CHECK BLOOD SUGAR ONCE A DAY  . potassium chloride (K-DUR) 10 MEQ tablet TAKE (1) TABLET DAILY AS DIRECTED.  Marland Kitchen pravastatin (PRAVACHOL) 40 MG tablet Take 1 tablet (40 mg total) by mouth daily.  . nitroGLYCERIN (NITROSTAT) 0.4 MG SL tablet  Place 1 tablet (0.4 mg total) under the tongue every 5 (five) minutes as needed for chest pain. (Patient not taking: Reported on 07/08/2018)  . [DISCONTINUED] cetirizine (ZYRTEC) 5 MG tablet TAKE 1 TABLET DAILY  . [DISCONTINUED] ciprofloxacin (CIPRO) 500 MG tablet Take 1 tablet (500 mg total) by mouth 2 (two) times daily.  . [DISCONTINUED] fluconazole (DIFLUCAN) 150 MG tablet Take 1 tablet (150 mg total) by mouth every three (3) days as needed.   No facility-administered encounter medications on file as of 07/08/2018.       Review of Systems  Constitutional: Negative.   HENT: Negative.   Eyes: Negative.   Respiratory: Negative.   Cardiovascular: Negative.   Gastrointestinal: Negative.   Endocrine: Negative.   Genitourinary: Positive for frequency.  Musculoskeletal: Positive for arthralgias (left hip pain / right knee pain).  Skin: Negative.   Allergic/Immunologic: Negative.   Neurological: Negative.   Hematological: Negative.   Psychiatric/Behavioral: Negative.        Objective:   Physical Exam  Constitutional: She is oriented to person, place, and time. She appears well-developed and well-nourished. No distress.  Patient is elderly but very alert and has become more intolerant of all of the arthritic pain she is having in her body.  HENT:  Head: Normocephalic and atraumatic.  Eyes: Pupils are equal, round, and reactive to light. Conjunctivae and EOM are normal. Right eye exhibits no discharge. Left eye exhibits no discharge. No scleral icterus.  Neck: Normal range of motion. Neck supple.  Tender left posterior neck close to the shoulder.  Cardiovascular: Normal rate, regular rhythm and normal heart sounds.  No murmur heard. Pulmonary/Chest: Effort normal and breath sounds normal. She has no wheezes. She has no rales.  Clear anteriorly and posteriorly  Abdominal: Soft. Bowel sounds are normal. There is no tenderness.  Musculoskeletal: Normal range of motion. She exhibits  tenderness. She exhibits no edema or deformity.  Pain with flexing the hip.  Pain with palpation to the left hip.  Some tenderness over the right anterior knee.  Tender in the left posterior neck at the base of the neck.  No masses palpable.  Neurological: She is alert and oriented to person, place, and time. She has normal reflexes.  Skin: Skin is warm and dry. No rash noted.  Psychiatric: She has a normal mood and affect. Her behavior is normal. Judgment and thought content normal.  Nursing note and vitals reviewed.  BP 124/81 (BP Location: Left Arm)   Pulse 78   Temp 98.1 F (36.7 C) (Oral)   Ht _0  (1.549 m)   Wt 166 lb (75.3 kg)   BMI 31.37 kg/m        Assessment & Plan:  1. Chronic left hip pain -Moist heat and extra strength Tylenol for pain - Ambulatory referral to Orthopedic Surgery  2. Chronic pain of right knee -Extra strength Tylenol for pain two tablets 3 or 4 times daily. - DG Knee  1-2 Views Right; Future - Ambulatory referral to Orthopedic Surgery  3. Urine frequency -No treatment until culture and sensitivity is returned - Urinalysis, Complete - Urine Culture - BMP8+EGFR  4. Controlled type 2 diabetes mellitus without complication, without long-term current use of insulin (HCC) -Continue to monitor blood sugars closely - Bayer DCA Hb A1c Waived  5.  Neck pain -Warm wet compresses 20 minutes 3 or 4 times daily -Patient had CT of the neck in April.  Patient Instructions  Use warm wet compresses to the posterior neck 20 minutes 3 or 4 times daily Continue to take extra strength Tylenol ,2   3 or 4 times daily if needed for pain We will make arrangements for the patient to see the orthopedic surgeon to look at her left hip pain and her right knee pain. May be better placed injections can help control the  inflammation and therefore help the pain. We will call with the results of the labs including the BMP hemoglobin A1c and urinalysis as soon as those  results become available  Arrie Senate MD

## 2018-07-09 ENCOUNTER — Telehealth: Payer: Self-pay | Admitting: Family Medicine

## 2018-07-09 LAB — BMP8+EGFR
BUN / CREAT RATIO: 15 (ref 12–28)
BUN: 11 mg/dL (ref 8–27)
CHLORIDE: 99 mmol/L (ref 96–106)
CO2: 17 mmol/L — AB (ref 20–29)
CREATININE: 0.74 mg/dL (ref 0.57–1.00)
Calcium: 9.9 mg/dL (ref 8.7–10.3)
GFR calc Af Amer: 84 mL/min/{1.73_m2} (ref >59)
GFR calc non Af Amer: 73 mL/min/{1.73_m2} (ref >59)
GLUCOSE: 181 mg/dL — AB (ref 65–99)
Potassium: 4.4 mmol/L (ref 3.5–5.2)
SODIUM: 136 mmol/L (ref 134–144)

## 2018-07-09 LAB — URINE CULTURE

## 2018-07-09 NOTE — Telephone Encounter (Signed)
Patient aware of results by Jan

## 2018-07-12 ENCOUNTER — Other Ambulatory Visit: Payer: Self-pay | Admitting: Family Medicine

## 2018-07-30 DIAGNOSIS — M7062 Trochanteric bursitis, left hip: Secondary | ICD-10-CM | POA: Diagnosis not present

## 2018-07-30 DIAGNOSIS — M545 Low back pain: Secondary | ICD-10-CM | POA: Diagnosis not present

## 2018-07-30 DIAGNOSIS — G8929 Other chronic pain: Secondary | ICD-10-CM | POA: Diagnosis not present

## 2018-07-30 DIAGNOSIS — M542 Cervicalgia: Secondary | ICD-10-CM | POA: Diagnosis not present

## 2018-08-13 ENCOUNTER — Other Ambulatory Visit: Payer: Self-pay | Admitting: Family Medicine

## 2018-08-20 DIAGNOSIS — J209 Acute bronchitis, unspecified: Secondary | ICD-10-CM | POA: Diagnosis not present

## 2018-08-20 DIAGNOSIS — R05 Cough: Secondary | ICD-10-CM | POA: Diagnosis not present

## 2018-09-03 ENCOUNTER — Encounter: Payer: Self-pay | Admitting: Family Medicine

## 2018-09-03 ENCOUNTER — Ambulatory Visit (INDEPENDENT_AMBULATORY_CARE_PROVIDER_SITE_OTHER): Payer: Medicare Other | Admitting: Family Medicine

## 2018-09-03 ENCOUNTER — Ambulatory Visit (INDEPENDENT_AMBULATORY_CARE_PROVIDER_SITE_OTHER): Payer: Medicare Other

## 2018-09-03 VITALS — BP 155/93 | HR 74 | Temp 96.7°F | Ht 61.0 in | Wt 159.0 lb

## 2018-09-03 DIAGNOSIS — R5383 Other fatigue: Secondary | ICD-10-CM

## 2018-09-03 DIAGNOSIS — E1169 Type 2 diabetes mellitus with other specified complication: Secondary | ICD-10-CM | POA: Diagnosis not present

## 2018-09-03 DIAGNOSIS — K219 Gastro-esophageal reflux disease without esophagitis: Secondary | ICD-10-CM | POA: Diagnosis not present

## 2018-09-03 DIAGNOSIS — R0789 Other chest pain: Secondary | ICD-10-CM

## 2018-09-03 DIAGNOSIS — E785 Hyperlipidemia, unspecified: Secondary | ICD-10-CM | POA: Diagnosis not present

## 2018-09-03 DIAGNOSIS — M25552 Pain in left hip: Secondary | ICD-10-CM

## 2018-09-03 DIAGNOSIS — R350 Frequency of micturition: Secondary | ICD-10-CM | POA: Diagnosis not present

## 2018-09-03 DIAGNOSIS — E1142 Type 2 diabetes mellitus with diabetic polyneuropathy: Secondary | ICD-10-CM

## 2018-09-03 DIAGNOSIS — E119 Type 2 diabetes mellitus without complications: Secondary | ICD-10-CM | POA: Diagnosis not present

## 2018-09-03 DIAGNOSIS — G8929 Other chronic pain: Secondary | ICD-10-CM

## 2018-09-03 DIAGNOSIS — E559 Vitamin D deficiency, unspecified: Secondary | ICD-10-CM | POA: Diagnosis not present

## 2018-09-03 DIAGNOSIS — I10 Essential (primary) hypertension: Secondary | ICD-10-CM | POA: Diagnosis not present

## 2018-09-03 LAB — URINALYSIS, COMPLETE
Bilirubin, UA: NEGATIVE
Glucose, UA: NEGATIVE
Ketones, UA: NEGATIVE
Leukocytes, UA: NEGATIVE
Nitrite, UA: NEGATIVE
Protein, UA: NEGATIVE
Specific Gravity, UA: 1.01 (ref 1.005–1.030)
Urobilinogen, Ur: 0.2 mg/dL (ref 0.2–1.0)
pH, UA: 6 (ref 5.0–7.5)

## 2018-09-03 LAB — BAYER DCA HB A1C WAIVED: HB A1C (BAYER DCA - WAIVED): 8.4 % — ABNORMAL HIGH (ref <7.0)

## 2018-09-03 LAB — MICROSCOPIC EXAMINATION: RENAL EPITHEL UA: NONE SEEN /HPF

## 2018-09-03 NOTE — Patient Instructions (Addendum)
Medicare Annual Wellness Visit  [facility] and the medical providers at [facility] strive to bring you the best medical care.  In doing so we not only want to address your current medical conditions and concerns but also to detect new conditions early and prevent illness, disease and health-related problems.    Medicare offers a yearly Wellness Visit which allows our clinical staff to assess your need for preventative services including immunizations, lifestyle education, counseling to decrease risk of preventable diseases and screening for fall risk and other medical concerns.    This visit is provided free of charge (no copay) for all Medicare recipients. The clinical pharmacists at [facility] have begun to conduct these Wellness Visits which will also include a thorough review of all your medications.    As you primary medical provider recommend that you make an appointment for your Annual Wellness Visit if you have not done so already this year.  You may set up this appointment before you leave today or you may call back ([phone]) and schedule an appointment.  Please make sure when you call that you mention that you are scheduling your Annual Wellness Visit with the clinical pharmacist so that the appointment may be made for the proper length of time.     Continue current medications. Continue good therapeutic lifestyle changes which include good diet and exercise. Fall precautions discussed with patient. If an FOBT was given today- please return it to our front desk. If you are over 40 years old - you may need Prevnar 60 or the adult Pneumonia vaccine.  **Flu shots are available--- please call and schedule a FLU-CLINIC appointment**  After your visit with [place] today you will receive a survey in the mail or online from [employer] regarding your care with [place]. Please take a moment to fill this out. Your feedback is very  important to [place] as you can help [place] better understand your patient needs as well as improve your experience and satisfaction. WE CARE ABOUT YOU!!!   Continue to take Mucinex maximum strength, plain, 1 twice daily with a large glass of water for cough and congestion Make sure that you take your omeprazole or Prilosec twice daily before breakfast and supper on a regular basis Avoid fried greasy and highly spicy foods Avoid caffeine Drink more water Take Tylenol arthritis 1 in the morning and 1 at night on a regular basis Check blood sugars before meals and not after meals Watch diet more carefully We will call with results of lab work kidney function average blood sugar and urinalysis soon as these results become available

## 2018-09-03 NOTE — Progress Notes (Signed)
Subjective:    Patient ID: Debra Phillips, female    DOB: Sep 29, 1928, 85 y.o.   MRN: [id number]  HPI Pt here for follow up and management of chronic medical problems which includes diabetes and hypertension. She is taking medication regularly.  Patient comes in today complaining with arthralgia, jitteriness, elevated blood sugar indigestion and urinary frequency.  Her initial blood pressure readings were high and still high on repeat.  She is a diabetic.  Before Christmas she made a visit to urgent care because of bronchitis.  The patient says she also is having some chest discomfort which is a little more than usual and it is pressure in nature.  She holds the front of her chest.  She has some ongoing shortness of breath but it has been about the same.  She does complain with the indigestion but the concern with the indigestion may be coming because she is not taking her Prilosec but once a day and I encouraged her to do this twice daily before breakfast and supper.  She says her blood sugars have been running high but she does not check them before meals she has been checking some after meals.  She denies any trouble with swallowing just indigestion and heartburn.  She will try to do better with her Prilosec taking it twice a day before breakfast and supper.  She is not seen any blood in the stool or had any black tarry bowel movements.  She has ongoing issue with urinary frequency and we will check a urinalysis today.    Patient Active Problem List   Diagnosis Date Noted  . Hyponatremia 12/22/2017  . Dysuria 01/22/2017  . Pelvic prolapse 08/28/2016  . Tremor 01/29/2016  . Abnormality of gait 01/29/2016  . Type 2 diabetes mellitus with hyperlipidemia ([place]) 01/14/2016  . Age-related macular degeneration 07/13/2015  . Keratoconjunctivitis sicca of both eyes ([place]) 07/13/2015  . Meibomian gland dysfunction (MGD), bilateral, both upper and lower lids 07/13/2015  . Pseudophakia of both eyes  07/13/2015  . Osteoarthritis of lumbar spine 05/08/2015  . Spondylosis of lumbar region without myelopathy or radiculopathy 09/01/2014  . Diabetes type 2, controlled ([place]) 04/26/2014  . Vitamin D deficiency 04/26/2014  . Chronic cystitis 04/26/2014  . Atrophic vaginitis 04/26/2014  . Solitary pulmonary nodule 03/20/2014  . Chest pain 03/18/2014  . Osteoporotic compression fracture of spine with routine healing 03/08/2014  . PALPITATIONS 02/27/2010  . Coronary atherosclerosis 12/12/2009  . Aortic aneurysm of unspecified site without mention of rupture 06/27/2009  . DYSPNEA 06/27/2009  . DYSLIPIDEMIA 12/12/2008  . Essential hypertension 12/12/2008  . GERD 12/12/2008  . OSTEOARTHRITIS 12/12/2008   Outpatient Encounter Medications as of 09/03/2018  Medication Sig  . aspirin EC 81 MG tablet Take 81 mg by mouth daily.  . Calcium Carb-Cholecalciferol 600-500 MG-UNIT CAPS Take 1 capsule by mouth every morning.  . cephALEXin (KEFLEX) 250 MG capsule Take 1 capsule (250 mg total) by mouth at bedtime.  . Cholecalciferol 1000 UNITS tablet Take 1,000 Units by mouth daily.   Marland Kitchen diltiazem (CARDIZEM CD) 180 MG 24 hr capsule TAKE (1) CAPSULE DAILY  . ESTRACE VAGINAL 0.1 MG/GM vaginal cream Place 1 Applicatorful vaginally once a week.   . hydroxypropyl methylcellulose / hypromellose (ISOPTO TEARS / GONIOVISC) 2.5 % ophthalmic solution Place 1 drop into both eyes 3 (three) times daily as needed for dry eyes.  Marland Kitchen lisinopril (PRINIVIL,ZESTRIL) 40 MG tablet TAKE 1 TABLET DAILY  . metFORMIN (GLUCOPHAGE) 500 MG tablet TAKE (1)  TABLET TWICE A DAY.  . Multiple Vitamins-Minerals (ICAPS AREDS 2 PO) Take 1 capsule by mouth daily.   Marland Kitchen MYRBETRIQ 25 MG TB24 tablet Take 1 tablet (25 mg total) by mouth daily.  . nitroGLYCERIN (NITROSTAT) 0.4 MG SL tablet Place 1 tablet (0.4 mg total) under the tongue every 5 (five) minutes as needed for chest pain.  . Omega-3 Fatty Acids (FISH OIL) 1000 MG CAPS Take 1,000 mg by mouth  every morning.   Marland Kitchen omeprazole (PRILOSEC) 40 MG capsule Take 1 capsule (40 mg total) by mouth 2 (two) times daily. (Patient taking differently: Take 40 mg by mouth every morning. )  . ONE TOUCH ULTRA TEST test strip CHECK BLOOD SUGAR ONCE A DAY  . potassium chloride (K-DUR) 10 MEQ tablet TAKE (1) TABLET DAILY AS DIRECTED.  Marland Kitchen pravastatin (PRAVACHOL) 40 MG tablet Take 1 tablet (40 mg total) by mouth daily.   No facility-administered encounter medications on file as of 09/03/2018.       Review of Systems  Constitutional: Negative.   HENT: Negative.   Eyes: Negative.   Respiratory: Negative.   Cardiovascular: Negative.   Gastrointestinal: Negative.        Indigestion   Endocrine: Negative.        BS runs high / feels "jittery"   Genitourinary: Negative.   Musculoskeletal: Positive for arthralgias.  Skin: Negative.   Allergic/Immunologic: Negative.   Neurological: Negative.   Hematological: Negative.   Psychiatric/Behavioral: Negative.        Objective:   Physical Exam Vitals signs and nursing note reviewed.  Constitutional:      Appearance: Normal appearance. She is well-developed. She is obese. She is not ill-appearing.     Comments: She is pleasant and kind and looks much younger than her stated age.  She has a lot of complaints mostly associated with arthritis neuralgias in the knees especially the left hip neck and back.  This is due to the hard work through many years and especially take care of her invalid husband who is passed away.  She says her blood sugars have been running in the 200 range.  She feels jittery at times and she is not been taking her Prilosec as regularly as she should.  HENT:     Head: Normocephalic and atraumatic.     Right Ear: Tympanic membrane, ear canal and external ear normal. There is no impacted cerumen.     Left Ear: Tympanic membrane, ear canal and external ear normal. There is no impacted cerumen.     Nose: Nose normal. No congestion.      Mouth/Throat:     Mouth: Mucous membranes are dry.     Pharynx: Oropharynx is clear.  Eyes:     General: No scleral icterus.       Right eye: No discharge.        Left eye: No discharge.     Extraocular Movements: Extraocular movements intact.     Conjunctiva/sclera: Conjunctivae normal.     Pupils: Pupils are equal, round, and reactive to light.  Neck:     Musculoskeletal: Normal range of motion and neck supple.     Thyroid: No thyromegaly.     Vascular: No carotid bruit or JVD.     Comments: Fairly good range of motion despite pain.  No thyromegaly or anterior cervical adenopathy Cardiovascular:     Rate and Rhythm: Normal rate and regular rhythm.     Pulses: Normal pulses.     Heart sounds:  Normal heart sounds. No murmur. No gallop.      Comments: Heart is regular at 72/min with good pedal pulses and no edema Pulmonary:     Effort: Pulmonary effort is normal. No respiratory distress.     Breath sounds: Normal breath sounds. No wheezing or rales.     Comments: Clear anteriorly and posteriorly with no rales wheezes or rhonchi Abdominal:     General: Abdomen is flat. Bowel sounds are normal.     Palpations: Abdomen is soft. There is no mass.     Tenderness: There is abdominal tenderness. There is no guarding or rebound.     Comments: Generalized abdominal tenderness especially in the epigastric and right and left upper quadrant areas.  Also tender in the lower abdomen.  No organ enlargement masses or bruits.  Musculoskeletal:        General: No tenderness.     Right lower leg: No edema.     Left lower leg: No edema.     Comments: Patient is slow to move because of ongoing back pain and does use a cane for ambulation.  She needs assistance with getting on the table.  She has ongoing knee pain and back stiffness.  Lymphadenopathy:     Cervical: No cervical adenopathy.  Skin:    General: Skin is warm and dry.     Findings: No rash.  Neurological:     General: No focal deficit  present.     Mental Status: She is alert and oriented to person, place, and time. Mental status is at baseline.     Cranial Nerves: No cranial nerve deficit.     Deep Tendon Reflexes: Reflexes are normal and symmetric. Reflexes normal.  Psychiatric:        Mood and Affect: Mood normal.        Behavior: Behavior normal.        Thought Content: Thought content normal.        Judgment: Judgment normal.    BP (!) 155/93   Pulse 74   Temp (!) 96.7 F (35.9 C) (Other (Comment))   Ht _0  (1.549 m)   Wt 159 lb (72.1 kg)   BMI 30.04 kg/m   EKG with results pending=== Chest x-ray with results pending===     Assessment & Plan:  1. Controlled type 2 diabetes mellitus without complication, without long-term current use of insulin (HCC) -Check A1c his blood sugars have been running in the 200 range according to the patient.  Make adjustments when information is returned to get better blood sugar control. - CBC with Differential/Platelet - Lipid panel - Bayer DCA Hb A1c Waived  2. Essential hypertension -Blood pressure is slightly elevated today on both checks but no change in treatment at this 1 setting - BMP8+EGFR - CBC with Differential/Platelet - Lipid panel - Hepatic function panel - EKG 12-Lead - DG Chest 2 View; Future  3. Vitamin D deficiency -Continue vitamin D replacement pending results of lab work - CBC with Differential/Platelet - VITAMIN D 25 Hydroxy (Vit-D Deficiency, Fractures)  4. Gastroesophageal reflux disease, esophagitis presence not specified -Patient supposed to been taking her omeprazole or Prilosec twice daily and has only been taking it once a day and she promises me that she will do better with taking it more regularly. - CBC with Differential/Platelet -Return FOBT  5. Urine frequency -Drink plenty of fluids and stay well-hydrated - Urinalysis, Complete - Urine Culture  6. Other fatigue - Thyroid Panel With TSH -  DG Chest 2 View; Future  7.  Chest discomfort - EKG 12-Lead - DG Chest 2 View; Future  8. Chronic left hip pain -Follow-up with orthopedic surgeon as planned for possible another injection in the hip  9. Type 2 diabetes mellitus with hyperlipidemia (HCC) -Check related labs and adjust medicines pending those results  10. Diabetic peripheral neuropathy (HCC) -Continue good blood sugar control.  This is through diet and exercise.  Patient Instructions                       Medicare Annual Wellness Visit  [facility] and the medical providers at [facility] strive to bring you the best medical care.  In doing so we not only want to address your current medical conditions and concerns but also to detect new conditions early and prevent illness, disease and health-related problems.    Medicare offers a yearly Wellness Visit which allows our clinical staff to assess your need for preventative services including immunizations, lifestyle education, counseling to decrease risk of preventable diseases and screening for fall risk and other medical concerns.    This visit is provided free of charge (no copay) for all Medicare recipients. The clinical pharmacists at [facility] have begun to conduct these Wellness Visits which will also include a thorough review of all your medications.    As you primary medical provider recommend that you make an appointment for your Annual Wellness Visit if you have not done so already this year.  You may set up this appointment before you leave today or you may call back ([phone]) and schedule an appointment.  Please make sure when you call that you mention that you are scheduling your Annual Wellness Visit with the clinical pharmacist so that the appointment may be made for the proper length of time.     Continue current medications. Continue good therapeutic lifestyle changes which include good diet and exercise. Fall precautions discussed with  patient. If an FOBT was given today- please return it to our front desk. If you are over 68 years old - you may need Prevnar 86 or the adult Pneumonia vaccine.  **Flu shots are available--- please call and schedule a FLU-CLINIC appointment**  After your visit with [place] today you will receive a survey in the mail or online from [employer] regarding your care with [place]. Please take a moment to fill this out. Your feedback is very important to [place] as you can help [place] better understand your patient needs as well as improve your experience and satisfaction. WE CARE ABOUT YOU!!!   Continue to take Mucinex maximum strength, plain, 1 twice daily with a large glass of water for cough and congestion Make sure that you take your omeprazole or Prilosec twice daily before breakfast and supper on a regular basis Avoid fried greasy and highly spicy foods Avoid caffeine Drink more water Take Tylenol arthritis 1 in the morning and 1 at night on a regular basis Check blood sugars before meals and not after meals Watch diet more carefully We will call with results of lab work kidney function average blood sugar and urinalysis soon as these results become available  Arrie Senate MD

## 2018-09-04 LAB — LIPID PANEL
Chol/HDL Ratio: 2.3 ratio (ref 0.0–4.4)
Cholesterol, Total: 167 mg/dL (ref 100–199)
HDL: 73 mg/dL (ref >39)
LDL Calculated: 66 mg/dL (ref 0–99)
TRIGLYCERIDES: 141 mg/dL (ref 0–149)
VLDL Cholesterol Cal: 28 mg/dL (ref 5–40)

## 2018-09-04 LAB — CBC WITH DIFFERENTIAL/PLATELET
Basophils Absolute: 0 10*3/uL (ref 0.0–0.2)
Basos: 0 %
EOS (ABSOLUTE): 0 10*3/uL (ref 0.0–0.4)
Eos: 0 %
Hematocrit: 38.9 % (ref 34.0–46.6)
Hemoglobin: 13.7 g/dL (ref 11.1–15.9)
IMMATURE GRANULOCYTES: 0 %
Immature Grans (Abs): 0 10*3/uL (ref 0.0–0.1)
Lymphocytes Absolute: 3 10*3/uL (ref 0.7–3.1)
Lymphs: 31 %
MCH: 31.5 pg (ref 26.6–33.0)
MCHC: 35.2 g/dL (ref 31.5–35.7)
MCV: 89 fL (ref 79–97)
Monocytes Absolute: 0.7 10*3/uL (ref 0.1–0.9)
Monocytes: 7 %
Neutrophils Absolute: 5.9 10*3/uL (ref 1.4–7.0)
Neutrophils: 62 %
Platelets: 305 10*3/uL (ref 150–450)
RBC: 4.35 x10E6/uL (ref 3.77–5.28)
RDW: 13.2 % (ref 11.7–15.4)
WBC: 9.7 10*3/uL (ref 3.4–10.8)

## 2018-09-04 LAB — HEPATIC FUNCTION PANEL
ALK PHOS: 67 IU/L (ref 39–117)
ALT: 20 IU/L (ref 0–32)
AST: 14 IU/L (ref 0–40)
Albumin: 4.4 g/dL (ref 3.5–4.7)
Bilirubin Total: 0.6 mg/dL (ref 0.0–1.2)
Bilirubin, Direct: 0.17 mg/dL (ref 0.00–0.40)
Total Protein: 6.7 g/dL (ref 6.0–8.5)

## 2018-09-04 LAB — THYROID PANEL WITH TSH
Free Thyroxine Index: 2 (ref 1.2–4.9)
T3 Uptake Ratio: 26 % (ref 24–39)
T4, Total: 7.7 ug/dL (ref 4.5–12.0)
TSH: 1.68 u[IU]/mL (ref 0.450–4.500)

## 2018-09-04 LAB — BMP8+EGFR
BUN/Creatinine Ratio: 15 (ref 12–28)
BUN: 11 mg/dL (ref 8–27)
CALCIUM: 9.7 mg/dL (ref 8.7–10.3)
CHLORIDE: 94 mmol/L — AB (ref 96–106)
CO2: 22 mmol/L (ref 20–29)
Creatinine, Ser: 0.74 mg/dL (ref 0.57–1.00)
GFR, EST AFRICAN AMERICAN: 84 mL/min/{1.73_m2} (ref >59)
GFR, EST NON AFRICAN AMERICAN: 73 mL/min/{1.73_m2} (ref >59)
Glucose: 200 mg/dL — ABNORMAL HIGH (ref 65–99)
Potassium: 5 mmol/L (ref 3.5–5.2)
Sodium: 135 mmol/L (ref 134–144)

## 2018-09-04 LAB — VITAMIN D 25 HYDROXY (VIT D DEFICIENCY, FRACTURES): Vit D, 25-Hydroxy: 40.7 ng/mL (ref 30.0–100.0)

## 2018-09-04 LAB — URINE CULTURE

## 2018-09-14 ENCOUNTER — Other Ambulatory Visit: Payer: Self-pay | Admitting: *Deleted

## 2018-09-14 MED ORDER — ONETOUCH ULTRASOFT LANCETS MISC: 9 refills | Status: DC

## 2018-09-16 DIAGNOSIS — M7062 Trochanteric bursitis, left hip: Secondary | ICD-10-CM | POA: Diagnosis not present

## 2018-09-16 DIAGNOSIS — M542 Cervicalgia: Secondary | ICD-10-CM | POA: Diagnosis not present

## 2018-09-18 LAB — HM DIABETES EYE EXAM

## 2018-09-29 ENCOUNTER — Encounter: Payer: Self-pay | Admitting: Pharmacist Clinician (PhC)/ Clinical Pharmacy Specialist

## 2018-09-29 ENCOUNTER — Ambulatory Visit (INDEPENDENT_AMBULATORY_CARE_PROVIDER_SITE_OTHER): Payer: Medicare Other | Admitting: Pharmacist Clinician (PhC)/ Clinical Pharmacy Specialist

## 2018-09-29 VITALS — BP 142/78

## 2018-09-29 DIAGNOSIS — E785 Hyperlipidemia, unspecified: Secondary | ICD-10-CM

## 2018-09-29 DIAGNOSIS — E1169 Type 2 diabetes mellitus with other specified complication: Secondary | ICD-10-CM

## 2018-09-29 MED ORDER — ESTRACE 0.1 MG/GM VA CREA: 1.0000 | TOPICAL_CREAM | VAGINAL | 1 refills | Status: DC

## 2018-09-29 MED ORDER — METFORMIN HCL 1000 MG PO TABS: 1000.0000 mg | ORAL_TABLET | Freq: Two times a day (BID) | ORAL | 3 refills | Status: DC

## 2018-09-29 NOTE — Progress Notes (Signed)
Patient is referred by Dr. Laurance Flatten for a type 2 diabetes consultation secondary to worsening diabetes control.  Patient was previously well controlled with an A1C ranging from 6.8-7.4 over the past year until January when it jumped up to 8.4%.  Patient has had a steroid injection and oral prednisone and has noticed a large increase in her home BG readings, they are in the 180-220 range.  She has increased her metformin several times to 1gm in the am and seen good results.  Patient has very poor knowledge on CHO and meal planning.  Breakfast:  Cheerios, wheeties, special K mixed together with milk.  Then adds some apple or grapes.  Coffee black  Lunch:  Yogurt and nabs  Dinner:  Green beans, veggie soup.  Not a big meet eater but does like chicken. Drinks water  A/P:  1.  Type 2 Diabetes with worsening control:  Counseled patient for 30 minutes on CHO meal planning and ADA diet.  She was also given written information and instructions.  Increased metformin to 1gm bid.  2.  Vaginal Dryness:  Refilled estrogen vaginal cream and recommended Replens OTC.  Dryness worsened with hyperglycemia.  3.  Lipids are well controlled, continue Pravastatin 40mg  daily.  Total time with patient:  45 minutes  Memory Argue, PharmD, CPP, CLS

## 2018-10-04 ENCOUNTER — Encounter: Payer: Self-pay | Admitting: Family Medicine

## 2018-10-04 ENCOUNTER — Ambulatory Visit (INDEPENDENT_AMBULATORY_CARE_PROVIDER_SITE_OTHER): Payer: Medicare Other | Admitting: Family Medicine

## 2018-10-04 VITALS — BP 129/74 | HR 79 | Temp 96.7°F | Ht 61.0 in | Wt 159.0 lb

## 2018-10-04 DIAGNOSIS — R5383 Other fatigue: Secondary | ICD-10-CM

## 2018-10-04 DIAGNOSIS — M150 Primary generalized (osteo)arthritis: Secondary | ICD-10-CM

## 2018-10-04 DIAGNOSIS — E559 Vitamin D deficiency, unspecified: Secondary | ICD-10-CM | POA: Diagnosis not present

## 2018-10-04 DIAGNOSIS — K219 Gastro-esophageal reflux disease without esophagitis: Secondary | ICD-10-CM

## 2018-10-04 DIAGNOSIS — M159 Polyosteoarthritis, unspecified: Secondary | ICD-10-CM

## 2018-10-04 DIAGNOSIS — M4005 Postural kyphosis, thoracolumbar region: Secondary | ICD-10-CM | POA: Diagnosis not present

## 2018-10-04 DIAGNOSIS — M542 Cervicalgia: Secondary | ICD-10-CM

## 2018-10-04 DIAGNOSIS — R6889 Other general symptoms and signs: Secondary | ICD-10-CM | POA: Diagnosis not present

## 2018-10-04 DIAGNOSIS — R350 Frequency of micturition: Secondary | ICD-10-CM

## 2018-10-04 DIAGNOSIS — M48061 Spinal stenosis, lumbar region without neurogenic claudication: Secondary | ICD-10-CM | POA: Diagnosis not present

## 2018-10-04 NOTE — Progress Notes (Signed)
Subjective:    Patient ID: Debra Phillips, female    DOB: 10/19/1928, 85 y.o.   MRN: [id number]  HPI Patient here today for 4 week follow up on fatigue, urine frequency and recent chest discomfort.  Patient's recent lab work was reviewed.  Her blood sugar has been running higher and her A1c was more increased.  We did have her go see the clinical pharmacist and the clinical pharmacist continued her multivitamin and omega-3 fatty acids along with her potassium.  She was having urinary tract symptoms at the last visit and the urine that was obtained was negative and did not need any antibiotic treatment.  She has ongoing complaints with fatigue.  She did have a thyroid profile and this was normal.  Her CBC had a normal white blood cell count and a normal hemoglobin.  She continues to have some chest discomfort and a recent EKG did not have any change.  She also continues with left hip pain though it may be some better.  She has seen the orthopedist and he felt like she had trochanteric bursitis.  A recent chest x-ray done in January showed no active cardiopulmonary disease.  Basically just hurts in her neck hurts in her back and her front side.  She has some occasional chest pain but no worse than usual she has some ongoing shortness of breath but no worse than usual she has some trouble with swallowing and does not take her omeprazole as regularly as she should.  She has had esophageal dilatation in the past.  She does occasionally have some burning and had this last week but that is now better she just has frequency.  The left hip pain continues and may be some better because of the injection she received by the orthopedic surgeon.  The blood sugars are somewhat better since the prednisone has gotten out of her system.    Patient Active Problem List   Diagnosis Date Noted  . Hyponatremia 12/22/2017  . Dysuria 01/22/2017  . Pelvic prolapse 08/28/2016  . Tremor 01/29/2016  . Abnormality of gait  01/29/2016  . Type 2 diabetes mellitus with hyperlipidemia ([place]) 01/14/2016  . Age-related macular degeneration 07/13/2015  . Keratoconjunctivitis sicca of both eyes ([place]) 07/13/2015  . Meibomian gland dysfunction (MGD), bilateral, both upper and lower lids 07/13/2015  . Pseudophakia of both eyes 07/13/2015  . Osteoarthritis of lumbar spine 05/08/2015  . Spondylosis of lumbar region without myelopathy or radiculopathy 09/01/2014  . Diabetes type 2, controlled ([place]) 04/26/2014  . Vitamin D deficiency 04/26/2014  . Chronic cystitis 04/26/2014  . Atrophic vaginitis 04/26/2014  . Solitary pulmonary nodule 03/20/2014  . Chest pain 03/18/2014  . Osteoporotic compression fracture of spine with routine healing 03/08/2014  . PALPITATIONS 02/27/2010  . Coronary atherosclerosis 12/12/2009  . Aortic aneurysm of unspecified site without mention of rupture 06/27/2009  . DYSPNEA 06/27/2009  . DYSLIPIDEMIA 12/12/2008  . Essential hypertension 12/12/2008  . GERD 12/12/2008  . OSTEOARTHRITIS 12/12/2008   Outpatient Encounter Medications as of 10/04/2018  Medication Sig  . aspirin EC 81 MG tablet Take 81 mg by mouth daily.  . Calcium Carb-Cholecalciferol 600-500 MG-UNIT CAPS Take 1 capsule by mouth every morning.  . cephALEXin (KEFLEX) 250 MG capsule Take 1 capsule (250 mg total) by mouth at bedtime.  . Cholecalciferol 1000 UNITS tablet Take 1,000 Units by mouth daily.   Marland Kitchen diltiazem (CARDIZEM CD) 180 MG 24 hr capsule TAKE (1) CAPSULE DAILY  . ESTRACE VAGINAL 0.1 MG/GM  vaginal cream Place 1 Applicatorful vaginally once a week.  . hydroxypropyl methylcellulose / hypromellose (ISOPTO TEARS / GONIOVISC) 2.5 % ophthalmic solution Place 1 drop into both eyes 3 (three) times daily as needed for dry eyes.  . Lancets (ONETOUCH ULTRASOFT) lancets CHECK BLOOD SUGAR ONCE A DAY  . lisinopril (PRINIVIL,ZESTRIL) 40 MG tablet TAKE 1 TABLET DAILY  . metFORMIN (GLUCOPHAGE) 1000 MG tablet Take 1 tablet (1,000 mg total)  by mouth 2 (two) times daily with a meal.  . MYRBETRIQ 25 MG TB24 tablet Take 1 tablet (25 mg total) by mouth daily.  . nitroGLYCERIN (NITROSTAT) 0.4 MG SL tablet Place 1 tablet (0.4 mg total) under the tongue every 5 (five) minutes as needed for chest pain.  Marland Kitchen omeprazole (PRILOSEC) 40 MG capsule Take 1 capsule (40 mg total) by mouth 2 (two) times daily. (Patient taking differently: Take 40 mg by mouth every morning. )  . ONE TOUCH ULTRA TEST test strip CHECK BLOOD SUGAR ONCE A DAY  . pravastatin (PRAVACHOL) 40 MG tablet Take 1 tablet (40 mg total) by mouth daily.  . [DISCONTINUED] potassium chloride (K-DUR) 10 MEQ tablet TAKE (1) TABLET DAILY AS DIRECTED.   No facility-administered encounter medications on file as of 10/04/2018.      Review of Systems  Constitutional: Positive for fatigue.  HENT: Negative.   Eyes: Negative.   Respiratory: Negative.   Cardiovascular: Negative.        Chest discomfort - continues   Gastrointestinal: Negative.   Endocrine: Negative.   Genitourinary: Negative.   Musculoskeletal: Negative.   Skin: Negative.   Allergic/Immunologic: Negative.   Neurological: Negative.   Hematological: Negative.   Psychiatric/Behavioral: Negative.        Objective:   Physical Exam Vitals signs and nursing note reviewed.  Constitutional:      General: She is not in acute distress.    Appearance: Normal appearance. She is well-developed. She is obese. She is not ill-appearing.     Comments: Patient is elderly but very alert and mostly complaining with symptoms related to arthritis and prolapse in the urinary tract.  HENT:     Head: Normocephalic and atraumatic.     Right Ear: Tympanic membrane, ear canal and external ear normal. There is no impacted cerumen.     Left Ear: Tympanic membrane, ear canal and external ear normal. There is no impacted cerumen.     Nose: Nose normal. No congestion.     Mouth/Throat:     Mouth: Mucous membranes are moist.     Pharynx:  Oropharynx is clear. No oropharyngeal exudate.     Comments: Well-hydrated Eyes:     General: No scleral icterus.       Right eye: No discharge.        Left eye: No discharge.     Extraocular Movements: Extraocular movements intact.     Conjunctiva/sclera: Conjunctivae normal.     Pupils: Pupils are equal, round, and reactive to light.  Neck:     Musculoskeletal: Normal range of motion and neck supple.     Thyroid: No thyromegaly.     Vascular: No carotid bruit or JVD.  Cardiovascular:     Rate and Rhythm: Normal rate and regular rhythm.     Pulses: Normal pulses.     Heart sounds: Normal heart sounds. No murmur. No gallop.      Comments: The heart has a regular rate and rhythm at 84/min with good pedal pulses and no edema and patient is wearing  support stockings. Pulmonary:     Effort: Pulmonary effort is normal. No respiratory distress.     Breath sounds: Normal breath sounds. No wheezing or rales.     Comments: Clear anteriorly and posteriorly Chest:     Chest wall: No tenderness.  Abdominal:     General: Bowel sounds are normal.     Palpations: Abdomen is soft. There is no mass.     Tenderness: There is abdominal tenderness.     Comments: Suprapubic tenderness slight epigastric tenderness no masses no bruits no organ enlargement  Musculoskeletal:        General: Deformity present. No tenderness.     Right lower leg: No edema.     Left lower leg: No edema.     Comments: Because of patient's kyphosis her center of gravity is off and she has a tendency to lean forward.  Because of her arthritis she is leaning forward.  She continues to have problems with her neck and her back.  Lymphadenopathy:     Cervical: No cervical adenopathy.  Skin:    General: Skin is warm and dry.     Findings: No rash.  Neurological:     General: No focal deficit present.     Mental Status: She is alert and oriented to person, place, and time. Mental status is at baseline.     Cranial Nerves: No  cranial nerve deficit.     Gait: Gait abnormal.     Deep Tendon Reflexes: Reflexes are normal and symmetric. Reflexes normal.     Comments: Abnormal gait secondary to forward leaning posture or kyphosis.  Psychiatric:        Mood and Affect: Mood normal.        Behavior: Behavior normal.        Thought Content: Thought content normal.        Judgment: Judgment normal.    BP 129/74 (BP Location: Left Arm)   Pulse 79   Temp (!) 96.7 F (35.9 C) (Oral)   Ht _0  (1.549 m)   Wt 159 lb (72.1 kg)   BMI 30.04 kg/m         Assessment & Plan:  1. Urine frequency - Urine Culture - Ambulatory referral to Urology  2. Other fatigue - Vitamin B12 - BMP8+EGFR  3. Neck pain - DG Cervical Spine Complete; Future  4. Vitamin D deficiency -Continue vitamin D replacement  5. Gastroesophageal reflux disease, esophagitis presence not specified -Patient is encouraged to take Prilosec more regularly.  6. Spinal stenosis of lumbar region, unspecified whether neurogenic claudication present -She should continue to be careful not put herself at risk for falling -She should take regular strength Tylenol in the morning another one at lunch and Tylenol arthritis at bedtime.  7. Primary osteoarthritis involving multiple joints -Take Tylenol as directed  8. Postural kyphosis of thoracolumbar region -She should use a walker or cane for support so as not to fall have any fractures.  No orders of the defined types were placed in this encounter.  Patient Instructions  Continue to take Tylenol arthritis at bedtime Take a regular strength Tylenol in the morning and at lunchtime Stay as active as possible and avoid climbing and do not put herself at risk for falling We will arrange for her to have an appointment with a urologist because of the ongoing complaints with urinary frequency with occasional burning We will plan to get C-spine films because of neck pain and headache We would encourage  her to drink more water  Arrie Senate MD

## 2018-10-04 NOTE — Patient Instructions (Signed)
Continue to take Tylenol arthritis at bedtime Take a regular strength Tylenol in the morning and at lunchtime Stay as active as possible and avoid climbing and do not put herself at risk for falling We will arrange for her to have an appointment with a urologist because of the ongoing complaints with urinary frequency with occasional burning We will plan to get C-spine films because of neck pain and headache We would encourage her to drink more water

## 2018-10-05 LAB — BMP8+EGFR
BUN/Creatinine Ratio: 19 (ref 12–28)
BUN: 10 mg/dL (ref 8–27)
CO2: 21 mmol/L (ref 20–29)
Calcium: 9.3 mg/dL (ref 8.7–10.3)
Chloride: 95 mmol/L — ABNORMAL LOW (ref 96–106)
Creatinine, Ser: 0.54 mg/dL — ABNORMAL LOW (ref 0.57–1.00)
GFR calc Af Amer: 98 mL/min/{1.73_m2} (ref >59)
GFR calc non Af Amer: 85 mL/min/{1.73_m2} (ref >59)
Glucose: 153 mg/dL — ABNORMAL HIGH (ref 65–99)
Potassium: 4.8 mmol/L (ref 3.5–5.2)
Sodium: 133 mmol/L — ABNORMAL LOW (ref 134–144)

## 2018-10-05 LAB — VITAMIN B12: Vitamin B-12: 593 pg/mL (ref 232–1245)

## 2018-10-06 ENCOUNTER — Other Ambulatory Visit: Payer: Self-pay | Admitting: *Deleted

## 2018-10-06 LAB — URINE CULTURE

## 2018-10-06 MED ORDER — CIPROFLOXACIN HCL 500 MG PO TABS: 500.0000 mg | ORAL_TABLET | Freq: Two times a day (BID) | ORAL | 0 refills | Status: DC

## 2018-10-07 ENCOUNTER — Telehealth: Payer: Self-pay | Admitting: Family Medicine

## 2018-10-07 DIAGNOSIS — N3020 Other chronic cystitis without hematuria: Secondary | ICD-10-CM | POA: Diagnosis not present

## 2018-10-07 DIAGNOSIS — N760 Acute vaginitis: Secondary | ICD-10-CM | POA: Diagnosis not present

## 2018-10-07 MED ORDER — FLUCONAZOLE 150 MG PO TABS: 150.0000 mg | ORAL_TABLET | Freq: Once | ORAL | 0 refills | Status: AC

## 2018-10-07 NOTE — Telephone Encounter (Signed)
Fax from [facility] Fluconazole 150 mg #3 1 tab q 3 days Patient request just in case since Rxd abx today

## 2018-10-08 ENCOUNTER — Other Ambulatory Visit (INDEPENDENT_AMBULATORY_CARE_PROVIDER_SITE_OTHER): Payer: Medicare Other

## 2018-10-08 DIAGNOSIS — M542 Cervicalgia: Secondary | ICD-10-CM | POA: Diagnosis not present

## 2018-10-08 DIAGNOSIS — M4802 Spinal stenosis, cervical region: Secondary | ICD-10-CM | POA: Diagnosis not present

## 2018-10-08 DIAGNOSIS — M4712 Other spondylosis with myelopathy, cervical region: Secondary | ICD-10-CM | POA: Diagnosis not present

## 2018-10-09 ENCOUNTER — Other Ambulatory Visit: Payer: Medicare Other

## 2018-10-09 DIAGNOSIS — Z1211 Encounter for screening for malignant neoplasm of colon: Secondary | ICD-10-CM

## 2018-10-11 ENCOUNTER — Other Ambulatory Visit: Payer: Self-pay | Admitting: *Deleted

## 2018-10-11 DIAGNOSIS — M542 Cervicalgia: Secondary | ICD-10-CM

## 2018-10-11 DIAGNOSIS — M5090 Cervical disc disorder, unspecified, unspecified cervical region: Secondary | ICD-10-CM

## 2018-10-11 DIAGNOSIS — M9981 Other biomechanical lesions of cervical region: Secondary | ICD-10-CM

## 2018-10-11 DIAGNOSIS — M4802 Spinal stenosis, cervical region: Secondary | ICD-10-CM

## 2018-10-11 DIAGNOSIS — Z1211 Encounter for screening for malignant neoplasm of colon: Secondary | ICD-10-CM | POA: Diagnosis not present

## 2018-10-11 NOTE — Progress Notes (Signed)
amb ref °

## 2018-10-12 LAB — FECAL OCCULT BLOOD, IMMUNOCHEMICAL: Fecal Occult Bld: NEGATIVE

## 2018-10-13 ENCOUNTER — Telehealth: Payer: Self-pay | Admitting: Family Medicine

## 2018-10-13 NOTE — Telephone Encounter (Signed)
Aware of stenosis and degenerative changes.  Neurosurgeon referral in process.

## 2018-10-19 ENCOUNTER — Ambulatory Visit (HOSPITAL_COMMUNITY)
Admission: RE | Admit: 2018-10-19 | Discharge: 2018-10-19 | Disposition: A | Discharge status: Home / Self Care | Payer: Medicare Other | Source: Ambulatory Visit | Attending: Family Medicine | Admitting: Family Medicine

## 2018-10-19 DIAGNOSIS — M9981 Other biomechanical lesions of cervical region: Secondary | ICD-10-CM | POA: Diagnosis not present

## 2018-10-19 DIAGNOSIS — M542 Cervicalgia: Secondary | ICD-10-CM | POA: Diagnosis not present

## 2018-10-19 DIAGNOSIS — M4802 Spinal stenosis, cervical region: Secondary | ICD-10-CM

## 2018-10-19 DIAGNOSIS — M5090 Cervical disc disorder, unspecified, unspecified cervical region: Secondary | ICD-10-CM | POA: Insufficient documentation

## 2018-10-25 ENCOUNTER — Ambulatory Visit (INDEPENDENT_AMBULATORY_CARE_PROVIDER_SITE_OTHER): Payer: Medicare Other | Admitting: Nurse Practitioner

## 2018-10-25 ENCOUNTER — Encounter: Payer: Self-pay | Admitting: Nurse Practitioner

## 2018-10-25 VITALS — BP 123/77 | HR 73 | Temp 97.8°F | Ht 61.0 in | Wt 160.0 lb

## 2018-10-25 DIAGNOSIS — J069 Acute upper respiratory infection, unspecified: Secondary | ICD-10-CM

## 2018-10-25 NOTE — Patient Instructions (Signed)

## 2018-10-25 NOTE — Progress Notes (Signed)
   Subjective:    Patient ID: Debra Phillips, female    DOB: 1929-06-15, 85 y.o.   MRN: [id number]   Chief Complaint: Nasal Congestion; Cough; and Sore Throat   HPI Patient comes in today c/o sore throat , cough and congestion that started 2 days ago. No fever.    Review of Systems  Constitutional: Negative for chills.  HENT: Positive for congestion and rhinorrhea. Negative for ear pain, sinus pressure, sinus pain, sore throat and trouble swallowing.   Respiratory: Positive for cough (nonprodutive).   Cardiovascular: Negative.   Gastrointestinal: Negative.   Neurological: Positive for headaches.  Psychiatric/Behavioral: Negative.   All other systems reviewed and are negative.      Objective:   Physical Exam Vitals signs and nursing note reviewed.  HENT:     Right Ear: Hearing, tympanic membrane, ear canal and external ear normal.     Left Ear: Hearing, tympanic membrane, ear canal and external ear normal.     Nose: Nose normal.     Right Sinus: No maxillary sinus tenderness.     Left Sinus: No maxillary sinus tenderness.     Mouth/Throat:     Lips: Pink.     Mouth: Mucous membranes are moist.     Pharynx: Posterior oropharyngeal erythema (mild) present.     Tonsils: Swelling: 0 on the right. 0 on the left.  Neck:     Musculoskeletal: Normal range of motion and neck supple.  Cardiovascular:     Rate and Rhythm: Normal rate and regular rhythm.     Heart sounds: Normal heart sounds.  Pulmonary:     Effort: Pulmonary effort is normal.     Breath sounds: Normal breath sounds.  Lymphadenopathy:     Cervical: No cervical adenopathy.  Skin:    General: Skin is warm.  Neurological:     General: No focal deficit present.     Mental Status: She is alert and oriented to person, place, and time.  Psychiatric:        Mood and Affect: Mood normal.        Behavior: Behavior normal.     BP 123/77   Pulse 73   Temp 97.8 F (36.6 C) (Oral)   Ht 5\' 1"  (1.549 m)   Wt 160  lb (72.6 kg)   BMI 30.23 kg/m        Assessment & Plan:  Debra Phillips in today with chief complaint of Nasal Congestion; Cough; and Sore Throat   1. Upper respiratory infection with cough and congestion 1. Take meds as prescribed 2. Use a cool mist humidifier especially during the winter months and when heat has been humid. 3. Use saline nose sprays frequently 4. Saline irrigations of the nose can be very helpful if done frequently.  * 4X daily for 1 week*  * Use of a nettie pot can be helpful with this. Follow directions with this* 5. Drink plenty of fluids 6. Keep thermostat turn down low 7.For any cough or congestion  Use plain Mucinex- regular strength or max strength is fine   * Children- consult with Pharmacist for dosing 8. For fever or aces or pains- take tylenol or ibuprofen appropriate for age and weight.  * for fevers greater than 101 orally you may alternate ibuprofen and tylenol every  3 hours.   Mary-Margaret Hassell Done, FNP

## 2018-10-26 ENCOUNTER — Telehealth: Payer: Self-pay | Admitting: Family Medicine

## 2018-10-26 NOTE — Telephone Encounter (Signed)
Pt aware neurosurgeon has access to MRI and x-rays

## 2018-10-28 DIAGNOSIS — M47812 Spondylosis without myelopathy or radiculopathy, cervical region: Secondary | ICD-10-CM | POA: Diagnosis not present

## 2018-10-29 ENCOUNTER — Ambulatory Visit (INDEPENDENT_AMBULATORY_CARE_PROVIDER_SITE_OTHER): Payer: Medicare Other | Admitting: Family Medicine

## 2018-10-29 VITALS — BP 138/80 | HR 80 | Temp 98.5°F | Wt 158.0 lb

## 2018-10-29 DIAGNOSIS — B9689 Other specified bacterial agents as the cause of diseases classified elsewhere: Secondary | ICD-10-CM

## 2018-10-29 DIAGNOSIS — J019 Acute sinusitis, unspecified: Secondary | ICD-10-CM

## 2018-10-29 MED ORDER — AMOXICILLIN-POT CLAVULANATE 875-125 MG PO TABS: 1.0000 | ORAL_TABLET | Freq: Two times a day (BID) | ORAL | 0 refills | Status: DC

## 2018-10-29 MED ORDER — BENZONATATE 100 MG PO CAPS: 100.0000 mg | ORAL_CAPSULE | Freq: Three times a day (TID) | ORAL | 0 refills | Status: DC | PRN

## 2018-10-29 NOTE — Progress Notes (Signed)
Subjective: CC: sinusitis/ cough PCP: Chipper Herb, MD KGY:JEHUDJ Debra Phillips is a 85 y.o. female presenting to clinic today for:  1. Sinusitis/ cough Patient is here with worsening cough.  She was seen on 10/25/2018 and thought to have a viral upper respiratory infection.  She has been using over-the-counter products with little improvement in symptoms.  She report the cough is productive and chunky.  She is been having significant sinus drainage.  Her ribs are sore from coughing profusely and she loses her breath during coughing spells.  No wheezes.  No hemoptysis.  No recent travel.   ROS: Per HPI  Allergies  Allergen Reactions  . Codeine Nausea And Vomiting  . Doxycycline Hives  . Other Nausea Only    Almost all antibiotics  . Sulfonamide Derivatives Hives  . Vancomycin Itching   Past Medical History:  Diagnosis Date  . Abnormality of gait 01/29/2016  . Aortic root dilatation (HCC)    Mild  . Cataract   . Diabetes mellitus without complication ([place])    type 2  . Dyslipidemia   . Dyspnea    with exertion  . Dysrhythmia    hx of occ palpitations  . Esophageal stricture    s/p dilation  . GERD (gastroesophageal reflux disease)   . Headache   . History of hiatal hernia   . Hyperlipidemia   . Hypertension   . Macular pucker, left eye   . Osteoarthritis   . Osteoporosis   . Tinnitus    left ear last week or so    Current Outpatient Medications:  .  aspirin EC 81 MG tablet, Take 81 mg by mouth daily., Disp: , Rfl:  .  Calcium Carb-Cholecalciferol 600-500 MG-UNIT CAPS, Take 1 capsule by mouth every morning., Disp: , Rfl:  .  Cholecalciferol 1000 UNITS tablet, Take 1,000 Units by mouth daily. , Disp: , Rfl:  .  diltiazem (CARDIZEM CD) 180 MG 24 hr capsule, TAKE (1) CAPSULE DAILY, Disp: 90 capsule, Rfl: 3 .  ESTRACE VAGINAL 0.1 MG/GM vaginal cream, Place 1 Applicatorful vaginally once a week., Disp: 42.5 g, Rfl: 1 .  hydroxypropyl methylcellulose / hypromellose  (ISOPTO TEARS / GONIOVISC) 2.5 % ophthalmic solution, Place 1 drop into both eyes 3 (three) times daily as needed for dry eyes., Disp: , Rfl:  .  Lancets (ONETOUCH ULTRASOFT) lancets, CHECK BLOOD SUGAR ONCE A DAY, Disp: 50 each, Rfl: 9 .  lisinopril (PRINIVIL,ZESTRIL) 40 MG tablet, TAKE 1 TABLET DAILY, Disp: 30 tablet, Rfl: 5 .  metFORMIN (GLUCOPHAGE) 1000 MG tablet, Take 1 tablet (1,000 mg total) by mouth 2 (two) times daily with a meal., Disp: 180 tablet, Rfl: 3 .  MYRBETRIQ 25 MG TB24 tablet, Take 1 tablet (25 mg total) by mouth daily., Disp: 90 tablet, Rfl: 3 .  nitroGLYCERIN (NITROSTAT) 0.4 MG SL tablet, Place 1 tablet (0.4 mg total) under the tongue every 5 (five) minutes as needed for chest pain., Disp: 25 tablet, Rfl: 3 .  omeprazole (PRILOSEC) 40 MG capsule, Take 1 capsule (40 mg total) by mouth 2 (two) times daily. (Patient taking differently: Take 40 mg by mouth every morning. ), Disp: 60 capsule, Rfl: 5 .  ONE TOUCH ULTRA TEST test strip, CHECK BLOOD SUGAR ONCE A DAY, Disp: 50 each, Rfl: 11 .  pravastatin (PRAVACHOL) 40 MG tablet, Take 1 tablet (40 mg total) by mouth daily., Disp: 90 tablet, Rfl: 3 Social History   Socioeconomic History  . Marital status: Widowed  Spouse name: Not on file  . Number of children: 5  . Years of education: 5  . Highest education level: Not on file  Occupational History  . 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 Financial resource strain: Not very hard  . Food insecurity:    Worry: Never true    Inability: Never true  . Transportation needs:    Medical: No    Non-medical: No  Tobacco Use  . Smoking status: Never Smoker  . Smokeless tobacco: Never Used  Substance and Sexual Activity  . Alcohol use: No  . Drug use: No  . Sexual activity: Not Currently  Lifestyle  . Physical activity:    Days per week: 3 days    Minutes per session: 10 min  . Stress: Only a little  Relationships  .  Social connections:    Talks on phone: More than three times a week    Gets together: More than three times a week    Attends religious service: More than 4 times per year    Active member of club or organization: Yes    Attends meetings of clubs or organizations: More than 4 times per year    Relationship status: Widowed  . Intimate partner violence:    Fear of current or ex partner: No    Emotionally abused: No    Physically abused: No    Forced sexual activity: No  Other Topics Concern  . Not on file  Social History Narrative   Pt lives at home alone   Husband w/ Alzheimer's passed away   Right-handed   Drinks 1-2 cups of coffee daily   Family History  Problem Relation Age of Onset  . Pneumonia Mother   . Brain cancer Father   . Tremor Father   . Arthritis Sister   . COPD Sister   . Emphysema Brother   . COPD Brother   . Tremor Brother   . Lung cancer Brother   . Heart disease Brother   . Liver cancer Brother   . Diabetes Other         1 child has diabetes at age 54 and the other 4 are healthy    Objective: Office vital signs reviewed. BP 138/80   Pulse 80   Temp 98.5 F (36.9 C)   Wt 158 lb (71.7 kg)   BMI 29.85 kg/m   Physical Examination:  General: Awake, alert, nontoxic, No acute distress HEENT: +TTP to frontal sinuses    Neck: No masses palpated. No lymphadenopathy    Ears: Tympanic membranes intact, normal light reflex, no erythema, no bulging    Eyes: PERRLA, extraocular membranes intact, sclera white    Nose: nasal turbinates moist, clear nasal discharge    Throat: moist mucus membranes, no erythema, no tonsillar exudate.  Airway is patent Cardio: regular rate and rhythm, S1S2 heard, no murmurs appreciated Pulm: clear to auscultation bilaterally, no wheezes, rhonchi or rales; normal work of breathing on room air  Assessment/ Plan: 85 y.o. female   1. Acute bacterial sinusitis Patient is afebrile nontoxic-appearing.  Given worsening of symptoms  with predominance at nighttime, empirically treating her for acute bacterial sinusitis with Augmentin 875 p.o. twice daily.  I have also sent Tessalon Perles into the pharmacy for cough.  Home care instructions reviewed and reasons for return evaluation and emergent evaluation emergency department discussed.  Patient voiced good understanding will follow-up PRN. -  amoxicillin-clavulanate (AUGMENTIN) 875-125 MG tablet; Take 1 tablet by mouth 2 (two) times daily.  Dispense: 20 tablet; Refill: 0   No orders of the defined types were placed in this encounter.  Meds ordered this encounter  Medications  . amoxicillin-clavulanate (AUGMENTIN) 875-125 MG tablet    Sig: Take 1 tablet by mouth 2 (two) times daily.    Dispense:  20 tablet    Refill:  0  . benzonatate (TESSALON PERLES) 100 MG capsule    Sig: Take 1 capsule (100 mg total) by mouth 3 (three) times daily as needed.    Dispense:  20 capsule    Refill:  [place], DO [place] [phone]

## 2018-10-29 NOTE — Patient Instructions (Signed)
   I have sent in amoxicillin and benzonatate.    The amoxicillin is your antibiotic.  Take this 2 times a day for the next 10 days.    The benzonatate is for coughing.  You can take this twice a day if needed for coughing.   You can take Tylenol for your headache/ sinus pressure.    Continue the Mucinex to help bring up the phlegm.  If you develop any blood in your cough, fevers, difficulty breathing, please seek immediate medical attention.

## 2018-11-02 ENCOUNTER — Other Ambulatory Visit: Payer: Self-pay | Admitting: Family Medicine

## 2018-11-02 ENCOUNTER — Telehealth: Payer: Self-pay | Admitting: Family Medicine

## 2018-11-02 MED ORDER — FLUCONAZOLE 150 MG PO TABS: 150.0000 mg | ORAL_TABLET | Freq: Once | ORAL | 0 refills | Status: AC

## 2018-11-02 NOTE — Telephone Encounter (Signed)
Diflucan sent

## 2018-11-02 NOTE — Telephone Encounter (Signed)
Patient aware.

## 2018-11-03 ENCOUNTER — Telehealth: Payer: Self-pay | Admitting: Family Medicine

## 2018-11-03 NOTE — Telephone Encounter (Signed)
PT daughter Mariann Laster is calling to talk to Roselyn Reef about  Mother states that the pt is in a lot of pain and was even crying last night due to the pain.

## 2018-11-03 NOTE — Telephone Encounter (Signed)
Daughter called and aware: appt with surgeon tomorrow = use tylenol for pain tonight regularly. Use imodium otc for diarrhea for now. She will also try warm wet compressions   Will call if things worsen

## 2018-11-03 NOTE — Telephone Encounter (Signed)
Pt was seen on 03/06 with Dr. Lajuana Ripple dx with a sinus infection. Pt c/o diarrhea and very sick and weak. Pt states her stool is dark like tar and very runny.  Pt also c/o neck pain. Wanting to speak with [name]

## 2018-11-04 ENCOUNTER — Other Ambulatory Visit: Payer: Self-pay | Admitting: Family Medicine

## 2018-11-04 DIAGNOSIS — M47812 Spondylosis without myelopathy or radiculopathy, cervical region: Secondary | ICD-10-CM | POA: Diagnosis not present

## 2018-11-04 DIAGNOSIS — R030 Elevated blood-pressure reading, without diagnosis of hypertension: Secondary | ICD-10-CM | POA: Diagnosis not present

## 2018-11-04 DIAGNOSIS — Z6829 Body mass index (BMI) 29.0-29.9, adult: Secondary | ICD-10-CM | POA: Diagnosis not present

## 2018-12-08 ENCOUNTER — Other Ambulatory Visit: Payer: Self-pay | Admitting: Family Medicine

## 2018-12-08 DIAGNOSIS — R300 Dysuria: Secondary | ICD-10-CM

## 2018-12-14 ENCOUNTER — Other Ambulatory Visit: Payer: Self-pay | Admitting: Family Medicine

## 2018-12-30 ENCOUNTER — Other Ambulatory Visit: Payer: Self-pay | Admitting: Family Medicine

## 2018-12-30 DIAGNOSIS — R300 Dysuria: Secondary | ICD-10-CM

## 2019-01-10 DIAGNOSIS — M47812 Spondylosis without myelopathy or radiculopathy, cervical region: Secondary | ICD-10-CM | POA: Diagnosis not present

## 2019-01-11 ENCOUNTER — Ambulatory Visit: Payer: Medicare Other | Admitting: Family Medicine

## 2019-01-11 ENCOUNTER — Other Ambulatory Visit: Payer: Self-pay

## 2019-01-11 NOTE — Progress Notes (Signed)
Telephone visit  Attempted to reach patient 3 times for scheduled 415 appointment.  Unfortunately, no answer.  2 voicemails were left in attempts to reach the patient.  I encouraged her to contact her office tomorrow to be seen if she still needs our services.  If she develops any worrisome symptoms or signs overnight I have advised her to go to the emergency department.  Start time: attempted to reach at 4:20pm, 4:29pm, 4:34pm  Debra Phillips, [place] [phone]

## 2019-01-12 ENCOUNTER — Other Ambulatory Visit: Payer: Self-pay

## 2019-01-12 ENCOUNTER — Ambulatory Visit (INDEPENDENT_AMBULATORY_CARE_PROVIDER_SITE_OTHER): Payer: Medicare Other | Admitting: Family Medicine

## 2019-01-12 ENCOUNTER — Encounter: Payer: Self-pay | Admitting: Family Medicine

## 2019-01-12 VITALS — BP 140/82 | HR 82 | Temp 97.8°F | Ht 60.0 in | Wt 157.0 lb

## 2019-01-12 DIAGNOSIS — R1084 Generalized abdominal pain: Secondary | ICD-10-CM | POA: Diagnosis not present

## 2019-01-12 LAB — URINALYSIS, COMPLETE
Bilirubin, UA: NEGATIVE
Glucose, UA: NEGATIVE
Ketones, UA: NEGATIVE
Leukocytes,UA: NEGATIVE
Nitrite, UA: NEGATIVE
Protein,UA: NEGATIVE
RBC, UA: NEGATIVE
Specific Gravity, UA: <1.005 — ABNORMAL LOW (ref 1.005–1.030)
Urobilinogen, Ur: 0.2 mg/dL (ref 0.2–1.0)
pH, UA: 5.5 (ref 5.0–7.5)

## 2019-01-12 LAB — MICROSCOPIC EXAMINATION
Bacteria, UA: NONE SEEN
Epithelial Cells (non renal): >10 /hpf — AB (ref 0–10)
RBC: NONE SEEN /hpf (ref 0–2)
Renal Epithel, UA: NONE SEEN /hpf

## 2019-01-12 MED ORDER — AMOXICILLIN 500 MG PO CAPS: 500.0000 mg | ORAL_CAPSULE | Freq: Three times a day (TID) | ORAL | 0 refills | Status: DC

## 2019-01-12 NOTE — Progress Notes (Signed)
Subjective:  Patient ID: Debra Phillips, female    DOB: 06-03-1929  Age: 85 y.o. MRN: [id number]  CC: Urinary Tract Infection   HPI CAMIRA GEIDEL presents for burning with urination and frequency for several days. Denies fever . No flank pain. No nausea, vomiting. Also heartburn. Unclear about how she is taking her omeprazole - once or twice daily, withor witout food?   Depression screen [facility] 2/9 01/12/2019 10/29/2018 10/04/2018  Decreased Interest 0 0 0  Down, Depressed, Hopeless 0 0 0  PHQ - 2 Score 0 0 0  Altered sleeping 0 0 -  Tired, decreased energy 0 0 -  Change in appetite 0 0 -  Feeling bad or failure about yourself  0 0 -  Trouble concentrating 0 0 -  Moving slowly or fidgety/restless 0 0 -  Suicidal thoughts 0 0 -  PHQ-9 Score 0 0 -  Difficult doing work/chores Not difficult at all - -  Some recent data might be hidden    History Penelope has a past medical history of Abnormality of gait (01/29/2016), Aortic root dilatation ([place]), Cataract, Diabetes mellitus without complication ([place]), Dyslipidemia, Dyspnea, Dysrhythmia, Esophageal stricture, GERD (gastroesophageal reflux disease), Headache, History of hiatal hernia, Hyperlipidemia, Hypertension, Macular pucker, left eye, Osteoarthritis, Osteoporosis, and Tinnitus.   She has a past surgical history that includes Total knee arthroplasty; Shoulder surgery; Knee arthroscopy (2004); Lumbar fusion (2000,2003); Radial head arthroplasty (5/12); Appendectomy; Cholecystectomy (1993); Cardiac catheterization (2010); Fracture surgery; Kyphosis surgery; Hardware Removal (12/10/2011); Eye surgery; Eye surgery; Back surgery; Abdominal hysterectomy; Vaginal prolapse repair (N/A, 08/28/2016); and Cystoscopy (N/A, 08/28/2016).   Her family history includes Arthritis in her sister; Brain cancer in her father; COPD in her brother and sister; Diabetes in an other family member; Emphysema in her brother; Heart disease in her brother; Liver cancer in  her brother; Lung cancer in her brother; Pneumonia in her mother; Tremor in her brother and father.She reports that she has never smoked. She has never used smokeless tobacco. She reports that she does not drink alcohol or use drugs.    ROS Review of Systems  Objective:  BP 140/82   Pulse 82   Temp 97.8 F (36.6 C) (Oral)   Ht 5' (1.524 m)   Wt 157 lb (71.2 kg)   BMI 30.66 kg/m   BP Readings from Last 3 Encounters:  01/12/19 140/82  10/29/18 138/80  10/25/18 123/77    Wt Readings from Last 3 Encounters:  01/12/19 157 lb (71.2 kg)  10/29/18 158 lb (71.7 kg)  10/25/18 160 lb (72.6 kg)     Physical Exam Constitutional:      Appearance: She is well-developed.  HENT:     Head: Normocephalic and atraumatic.  Cardiovascular:     Rate and Rhythm: Normal rate and regular rhythm.     Heart sounds: No murmur.  Pulmonary:     Effort: Pulmonary effort is normal.     Breath sounds: Normal breath sounds.  Abdominal:     General: Bowel sounds are normal.     Palpations: Abdomen is soft. There is no mass.     Tenderness: There is no abdominal tenderness. There is no guarding or rebound.  Musculoskeletal:        General: No tenderness.  Skin:    General: Skin is warm and dry.  Neurological:     Mental Status: She is alert and oriented to person, place, and time.  Psychiatric:        Behavior:  Behavior normal.       Assessment & Plan:   Maysa was seen today for urinary tract infection.  Diagnoses and all orders for this visit:  Generalized abdominal pain -     Urinalysis, Complete  Other orders -     amoxicillin (AMOXIL) 500 MG capsule; Take 1 capsule (500 mg total) by mouth 3 (three) times daily. -     Microscopic Examination       I have discontinued Liviya M. Shelvin's amoxicillin-clavulanate and benzonatate. I am also having her start on amoxicillin. Additionally, I am having her maintain her Cholecalciferol, Calcium Carb-Cholecalciferol, nitroGLYCERIN,  aspirin EC, omeprazole, hydroxypropyl methylcellulose / hypromellose, diltiazem, ONE TOUCH ULTRA TEST, onetouch ultrasoft, metFORMIN, ESTRACE VAGINAL, pravastatin, lisinopril, and Myrbetriq.  Allergies as of 01/12/2019      Reactions   Codeine Nausea And Vomiting   Doxycycline Hives   Other Nausea Only   Almost all antibiotics   Sulfonamide Derivatives Hives   Vancomycin Itching      Medication List       Accurate as of Jan 12, 2019  5:54 PM. If you have any questions, ask your nurse or doctor.        STOP taking these medications   amoxicillin-clavulanate 875-125 MG tablet Commonly known as:  AUGMENTIN Stopped by:  Claretta Fraise, MD   benzonatate 100 MG capsule Commonly known as:  Best boy Stopped by:  Claretta Fraise, MD     TAKE these medications   amoxicillin 500 MG capsule Commonly known as:  AMOXIL Take 1 capsule (500 mg total) by mouth 3 (three) times daily. Started by:  Claretta Fraise, MD   aspirin EC 81 MG tablet Take 81 mg by mouth daily.   Calcium Carb-Cholecalciferol 600-500 MG-UNIT Caps Take 1 capsule by mouth every morning.   Cholecalciferol 25 MCG (1000 UT) tablet Take 1,000 Units by mouth daily.   diltiazem 180 MG 24 hr capsule Commonly known as:  CARDIZEM CD TAKE (1) CAPSULE DAILY   ESTRACE VAGINAL 0.1 MG/GM vaginal cream Generic drug:  estradiol Place 1 Applicatorful vaginally once a week.   hydroxypropyl methylcellulose / hypromellose 2.5 % ophthalmic solution Commonly known as:  ISOPTO TEARS / GONIOVISC Place 1 drop into both eyes 3 (three) times daily as needed for dry eyes.   lisinopril 40 MG tablet Commonly known as:  ZESTRIL TAKE 1 TABLET DAILY   metFORMIN 1000 MG tablet Commonly known as:  GLUCOPHAGE Take 1 tablet (1,000 mg total) by mouth 2 (two) times daily with a meal.   Myrbetriq 25 MG Tb24 tablet Generic drug:  mirabegron ER Take 1 tablet (25 mg total) by mouth daily.   nitroGLYCERIN 0.4 MG SL tablet Commonly known  as:  NITROSTAT Place 1 tablet (0.4 mg total) under the tongue every 5 (five) minutes as needed for chest pain.   omeprazole 40 MG capsule Commonly known as:  PRILOSEC Take 1 capsule (40 mg total) by mouth 2 (two) times daily. What changed:  when to take this   ONE TOUCH ULTRA TEST test strip Generic drug:  glucose blood CHECK BLOOD SUGAR ONCE A DAY   onetouch ultrasoft lancets CHECK BLOOD SUGAR ONCE A DAY   pravastatin 40 MG tablet Commonly known as:  PRAVACHOL TAKE 1 TABLET DAILY        Follow-up: Return if symptoms worsen or fail to improve.  Claretta Fraise, M.D.

## 2019-01-18 ENCOUNTER — Telehealth: Payer: Self-pay | Admitting: Family Medicine

## 2019-01-18 ENCOUNTER — Other Ambulatory Visit: Payer: Self-pay | Admitting: *Deleted

## 2019-01-18 MED ORDER — FLUCONAZOLE 150 MG PO TABS: 150.0000 mg | ORAL_TABLET | Freq: Once | ORAL | 1 refills | Status: AC

## 2019-01-21 ENCOUNTER — Ambulatory Visit (INDEPENDENT_AMBULATORY_CARE_PROVIDER_SITE_OTHER): Payer: Medicare Other | Admitting: Physician Assistant

## 2019-01-21 ENCOUNTER — Other Ambulatory Visit: Payer: Self-pay

## 2019-01-21 ENCOUNTER — Encounter: Payer: Self-pay | Admitting: Physician Assistant

## 2019-01-21 DIAGNOSIS — N3020 Other chronic cystitis without hematuria: Secondary | ICD-10-CM | POA: Diagnosis not present

## 2019-01-21 LAB — MICROSCOPIC EXAMINATION
RBC, Urine: NONE SEEN /hpf (ref 0–2)
Renal Epithel, UA: NONE SEEN /hpf

## 2019-01-21 LAB — URINALYSIS, COMPLETE
Bilirubin, UA: NEGATIVE
Glucose, UA: NEGATIVE
Ketones, UA: NEGATIVE
Leukocytes,UA: NEGATIVE
Nitrite, UA: NEGATIVE
Protein,UA: NEGATIVE
RBC, UA: NEGATIVE
Specific Gravity, UA: 1.02 (ref 1.005–1.030)
Urobilinogen, Ur: 0.2 mg/dL (ref 0.2–1.0)
pH, UA: 7 (ref 5.0–7.5)

## 2019-01-21 MED ORDER — CEPHALEXIN 500 MG PO CAPS: 500.0000 mg | ORAL_CAPSULE | Freq: Three times a day (TID) | ORAL | 0 refills | Status: DC

## 2019-01-21 NOTE — Progress Notes (Signed)
Telephone visit  Subjective: CC:uti PCP: Chipper Herb, MD Debra Phillips is a 85 y.o. female calls for telephone consult today. Patient provides verbal consent for consult held via phone.  Patient is identified with 2 separate identifiers.  At this time the entire area is on COVID-19 social distancing and stay home orders are in place.  Patient is of higher risk and therefore we are performing this by a virtual method.  Location of patient: home Location of provider: [facility] Others present for call: no  This patient has had ongoing recurrent urinary tract infections.  She was recently treated with amoxicillin.  She states that she cannot tell a significant difference there was no culture ordered on that particular day.  She states she actually is feeling worse.  She is having the hot flashes, Loss of bladder control at times, frequency, nausea.  She denies any vomiting or diarrhea.  Asked her to come to the office to have urine collected.  I will go ahead and send Keflex to the pharmacy to empirically treat a bladder infection.    ROS: Per HPI  Allergies  Allergen Reactions  . Codeine Nausea And Vomiting  . Doxycycline Hives  . Other Nausea Only    Almost all antibiotics  . Sulfonamide Derivatives Hives  . Vancomycin Itching   Past Medical History:  Diagnosis Date  . Abnormality of gait 01/29/2016  . Aortic root dilatation (HCC)    Mild  . Cataract   . Diabetes mellitus without complication ([place])    type 2  . Dyslipidemia   . Dyspnea    with exertion  . Dysrhythmia    hx of occ palpitations  . Esophageal stricture    s/p dilation  . GERD (gastroesophageal reflux disease)   . Headache   . History of hiatal hernia   . Hyperlipidemia   . Hypertension   . Macular pucker, left eye   . Osteoarthritis   . Osteoporosis   . Tinnitus    left ear last week or so    Current Outpatient Medications:  .  aspirin EC 81 MG tablet, Take 81 mg by mouth daily.,  Disp: , Rfl:  .  Calcium Carb-Cholecalciferol 600-500 MG-UNIT CAPS, Take 1 capsule by mouth every morning., Disp: , Rfl:  .  cephALEXin (KEFLEX) 500 MG capsule, Take 1 capsule (500 mg total) by mouth 3 (three) times daily., Disp: 30 capsule, Rfl: 0 .  Cholecalciferol 1000 UNITS tablet, Take 1,000 Units by mouth daily. , Disp: , Rfl:  .  diltiazem (CARDIZEM CD) 180 MG 24 hr capsule, TAKE (1) CAPSULE DAILY, Disp: 90 capsule, Rfl: 3 .  ESTRACE VAGINAL 0.1 MG/GM vaginal cream, Place 1 Applicatorful vaginally once a week., Disp: 42.5 g, Rfl: 1 .  hydroxypropyl methylcellulose / hypromellose (ISOPTO TEARS / GONIOVISC) 2.5 % ophthalmic solution, Place 1 drop into both eyes 3 (three) times daily as needed for dry eyes., Disp: , Rfl:  .  Lancets (ONETOUCH ULTRASOFT) lancets, CHECK BLOOD SUGAR ONCE A DAY, Disp: 50 each, Rfl: 9 .  lisinopril (ZESTRIL) 40 MG tablet, TAKE 1 TABLET DAILY, Disp: 30 tablet, Rfl: 0 .  metFORMIN (GLUCOPHAGE) 1000 MG tablet, Take 1 tablet (1,000 mg total) by mouth 2 (two) times daily with a meal., Disp: 180 tablet, Rfl: 3 .  MYRBETRIQ 25 MG TB24 tablet, Take 1 tablet (25 mg total) by mouth daily., Disp: 30 tablet, Rfl: 0 .  nitroGLYCERIN (NITROSTAT) 0.4 MG SL tablet, Place 1  tablet (0.4 mg total) under the tongue every 5 (five) minutes as needed for chest pain., Disp: 25 tablet, Rfl: 3 .  omeprazole (PRILOSEC) 40 MG capsule, Take 1 capsule (40 mg total) by mouth 2 (two) times daily. (Patient taking differently: Take 40 mg by mouth every morning. ), Disp: 60 capsule, Rfl: 5 .  ONE TOUCH ULTRA TEST test strip, CHECK BLOOD SUGAR ONCE A DAY, Disp: 50 each, Rfl: 11 .  pravastatin (PRAVACHOL) 40 MG tablet, TAKE 1 TABLET DAILY, Disp: 90 tablet, Rfl: 0  Assessment/ Plan: 85 y.o. female   1. Chronic cystitis - Urine Culture; Future - Urinalysis, Complete; Future - cephALEXin (KEFLEX) 500 MG capsule; Take 1 capsule (500 mg total) by mouth 3 (three) times daily.  Dispense: 30 capsule;  Refill: 0   Start time: 12:21 PM End time: 12:29 PM  Meds ordered this encounter  Medications  . cephALEXin (KEFLEX) 500 MG capsule    Sig: Take 1 capsule (500 mg total) by mouth 3 (three) times daily.    Dispense:  30 capsule    Refill:  0    Order Specific Question:   Supervising Provider    Answer:   [name] [[id number]]    Particia Nearing PA-C [place] [phone]

## 2019-01-21 NOTE — Addendum Note (Signed)
Addended by: Liliane Bade on: 01/21/2019 02:09 PM   Modules accepted: Orders

## 2019-01-23 LAB — URINE CULTURE

## 2019-02-01 ENCOUNTER — Other Ambulatory Visit: Payer: Self-pay | Admitting: Family Medicine

## 2019-02-01 ENCOUNTER — Other Ambulatory Visit: Payer: Self-pay | Admitting: Family

## 2019-02-01 DIAGNOSIS — N3020 Other chronic cystitis without hematuria: Secondary | ICD-10-CM

## 2019-02-01 DIAGNOSIS — R300 Dysuria: Secondary | ICD-10-CM

## 2019-02-02 DIAGNOSIS — N3020 Other chronic cystitis without hematuria: Secondary | ICD-10-CM | POA: Diagnosis not present

## 2019-02-02 DIAGNOSIS — N3946 Mixed incontinence: Secondary | ICD-10-CM | POA: Diagnosis not present

## 2019-02-02 MED ORDER — CEPHALEXIN 500 MG PO CAPS: 500.0000 mg | ORAL_CAPSULE | Freq: Three times a day (TID) | ORAL | 0 refills | Status: DC

## 2019-02-03 ENCOUNTER — Other Ambulatory Visit: Payer: Self-pay

## 2019-02-03 ENCOUNTER — Encounter: Payer: Self-pay | Admitting: Family Medicine

## 2019-02-03 ENCOUNTER — Ambulatory Visit (INDEPENDENT_AMBULATORY_CARE_PROVIDER_SITE_OTHER): Payer: Medicare Other | Admitting: Family Medicine

## 2019-02-03 DIAGNOSIS — E1142 Type 2 diabetes mellitus with diabetic polyneuropathy: Secondary | ICD-10-CM | POA: Diagnosis not present

## 2019-02-03 DIAGNOSIS — E1169 Type 2 diabetes mellitus with other specified complication: Secondary | ICD-10-CM | POA: Diagnosis not present

## 2019-02-03 DIAGNOSIS — E559 Vitamin D deficiency, unspecified: Secondary | ICD-10-CM | POA: Diagnosis not present

## 2019-02-03 DIAGNOSIS — M3501 Sicca syndrome with keratoconjunctivitis: Secondary | ICD-10-CM

## 2019-02-03 DIAGNOSIS — M150 Primary generalized (osteo)arthritis: Secondary | ICD-10-CM

## 2019-02-03 DIAGNOSIS — M159 Polyosteoarthritis, unspecified: Secondary | ICD-10-CM

## 2019-02-03 DIAGNOSIS — E785 Hyperlipidemia, unspecified: Secondary | ICD-10-CM | POA: Diagnosis not present

## 2019-02-03 DIAGNOSIS — M9981 Other biomechanical lesions of cervical region: Secondary | ICD-10-CM

## 2019-02-03 DIAGNOSIS — I10 Essential (primary) hypertension: Secondary | ICD-10-CM | POA: Diagnosis not present

## 2019-02-03 DIAGNOSIS — M5090 Cervical disc disorder, unspecified, unspecified cervical region: Secondary | ICD-10-CM

## 2019-02-03 DIAGNOSIS — M48061 Spinal stenosis, lumbar region without neurogenic claudication: Secondary | ICD-10-CM

## 2019-02-03 DIAGNOSIS — K219 Gastro-esophageal reflux disease without esophagitis: Secondary | ICD-10-CM

## 2019-02-03 DIAGNOSIS — M4802 Spinal stenosis, cervical region: Secondary | ICD-10-CM

## 2019-02-03 DIAGNOSIS — E119 Type 2 diabetes mellitus without complications: Secondary | ICD-10-CM

## 2019-02-03 DIAGNOSIS — H16223 Keratoconjunctivitis sicca, not specified as Sjogren's, bilateral: Secondary | ICD-10-CM

## 2019-02-03 NOTE — Progress Notes (Signed)
Virtual Visit Via telephone Note I connected with@ on 02/03/19 by telephone and verified that I am speaking with the correct person or authorized healthcare agent using two identifiers. Debra Phillips is currently located at home and there are no unauthorized people in close proximity. I completed this visit while in a private location in my home .  This visit type was conducted due to national recommendations for restrictions regarding the COVID-19 Pandemic (e.g. social distancing).  This format is felt to be most appropriate for this patient at this time.  All issues noted in this document were discussed and addressed.  No physical exam was performed.    I discussed the limitations, risks, security and privacy concerns of performing an evaluation and management service by telephone and the availability of in person appointments. I also discussed with the patient that there may be a patient responsible charge related to this service. The patient expressed understanding and agreed to proceed.   Date:  02/03/2019    ID:  Debra Phillips      1929/03/07        [id number]   Patient Care Team Patient Care Team: Chipper Herb, MD as PCP - General (Family Medicine) Minus Breeding, MD as PCP - Cardiology (Cardiology) Irine Seal, MD as Consulting Physician (Urology) Minus Breeding, MD as Consulting Physician (Cardiology) Glenna Fellows, MD as Attending Physician (Neurosurgery) Dorene Ar, MD as Consulting Physician (Pain Medicine) Bunnie Pion, MD as Consulting Physician (Ophthalmology)  Reason for Visit: Primary Care Follow-up     History of Present Illness & Review of Systems:     Debra Phillips is a 85 y.o. year old female primary care patient that presents today for a telehealth visit.  Patient is pleasant and doing well especially for her age of 43 years.  Her children are very close to her and have been checking on her frequently to make sure that she is  protected in this time of COVID-19.  She still drives.  She has had chronic problems mostly with her back and neck and severe osteoarthritis with some spinal stenosis.  She has been seeing Dr. Carloyn Manner and now doctors in [place] for injections in her neck and back.  The recent injections in the neck have helped.  She has diabetes osteoarthritis chronic urinary tract infections and hyperlipidemia.  She is very with it for her age of 44 years.  Today she denies any chest pain pressure or tightness or shortness of breath.  She is having some trouble with indigestion and belching even though she is taking omeprazole twice daily.  Her daughter has set up her an appointment to see a stomach specialist in [place] for further evaluation.  She denies any trouble with blood in the stool or changes in bowel habits.  She has not had any black tarry stools.  She is passing her water well and is taking cephalexin on a regular basis 1 daily to help prevent urinary tract infections.  Review of systems as stated, otherwise negative.  The patient does not have symptoms concerning for COVID-19 infection (fever, chills, cough, or new shortness of breath).      Current Medications (Verified) Allergies as of 02/03/2019      Reactions   Codeine Nausea And Vomiting   Doxycycline Hives   Other Nausea Only   Almost all antibiotics   Sulfonamide Derivatives Hives   Vancomycin Itching      Medication List  Accurate as of February 03, 2019  1:54 PM. If you have any questions, ask your nurse or doctor.        aspirin EC 81 MG tablet Take 81 mg by mouth daily.   Calcium Carb-Cholecalciferol 600-500 MG-UNIT Caps Take 1 capsule by mouth every morning.   cephALEXin 500 MG capsule Commonly known as: KEFLEX Take 1 capsule (500 mg total) by mouth 3 (three) times daily.   Cholecalciferol 25 MCG (1000 UT) tablet Take 1,000 Units by mouth daily.   diltiazem 180 MG 24 hr capsule Commonly known as: CARDIZEM CD TAKE  (1) CAPSULE DAILY   ESTRACE VAGINAL 0.1 MG/GM vaginal cream Generic drug: estradiol Place 1 Applicatorful vaginally once a week.   hydroxypropyl methylcellulose / hypromellose 2.5 % ophthalmic solution Commonly known as: ISOPTO TEARS / GONIOVISC Place 1 drop into both eyes 3 (three) times daily as needed for dry eyes.   lisinopril 40 MG tablet Commonly known as: ZESTRIL TAKE 1 TABLET DAILY   metFORMIN 1000 MG tablet Commonly known as: GLUCOPHAGE Take 1 tablet (1,000 mg total) by mouth 2 (two) times daily with a meal.   Myrbetriq 25 MG Tb24 tablet Generic drug: mirabegron ER Take 1 tablet (25 mg total) by mouth daily.   nitroGLYCERIN 0.4 MG SL tablet Commonly known as: NITROSTAT Place 1 tablet (0.4 mg total) under the tongue every 5 (five) minutes as needed for chest pain.   omeprazole 40 MG capsule Commonly known as: PRILOSEC Take 1 capsule (40 mg total) by mouth 2 (two) times daily.   ONE TOUCH ULTRA TEST test strip Generic drug: glucose blood CHECK BLOOD SUGAR ONCE A DAY   onetouch ultrasoft lancets CHECK BLOOD SUGAR ONCE A DAY   pravastatin 40 MG tablet Commonly known as: PRAVACHOL TAKE 1 TABLET DAILY           Allergies (Verified)    Codeine, Doxycycline, Other, Sulfonamide derivatives, and Vancomycin  Past Medical History Past Medical History:  Diagnosis Date  . Abnormality of gait 01/29/2016  . Aortic root dilatation (HCC)    Mild  . Cataract   . Diabetes mellitus without complication ([place])    type 2  . Dyslipidemia   . Dyspnea    with exertion  . Dysrhythmia    hx of occ palpitations  . Esophageal stricture    s/p dilation  . GERD (gastroesophageal reflux disease)   . Headache   . History of hiatal hernia   . Hyperlipidemia   . Hypertension   . Macular pucker, left eye   . Osteoarthritis   . Osteoporosis   . Tinnitus    left ear last week or so     Past Surgical History:  Procedure Laterality Date  . ABDOMINAL HYSTERECTOMY      partial  . APPENDECTOMY    . BACK SURGERY     lower x 1  . CARDIAC CATHETERIZATION  2010   both cataracts  . CHOLECYSTECTOMY  1993  . CYSTOSCOPY N/A 08/28/2016   Procedure: CYSTOSCOPY;  Surgeon: Carolan Clines, MD;  Location: [facility];  Service: Urology;  Laterality: N/A;  . EYE SURGERY     cataracts  . EYE SURGERY     for macular pucker  . FRACTURE SURGERY     rt wrist2010  . HARDWARE REMOVAL  12/10/2011   Procedure: HARDWARE REMOVAL;  Surgeon: Schuyler Amor, MD;  Location: [facility];  Service: Orthopedics;  Laterality: Right;  radial head  . KNEE ARTHROSCOPY  2004  Left and right  . KYPHOSIS SURGERY    . LUMBAR FUSION  [id number]  . RADIAL HEAD ARTHROPLASTY  5/12   orif rt radial head  . SHOULDER SURGERY     Right rotator cuff epair  . TOTAL KNEE ARTHROPLASTY     Left  . VAGINAL PROLAPSE REPAIR N/A 08/28/2016   Procedure: ANTERIOR VAGINAL VAULT SUSPENSION WITH SACROSPINOUS FIXATION;  Surgeon: Carolan Clines, MD;  Location: [facility];  Service: Urology;  Laterality: N/A;    Social History   Socioeconomic History  . Marital status: Widowed    Spouse name: Not on file  . Number of children: 5  . Years of education: 5  . Highest education level: Not on file  Occupational History  . 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 Financial resource strain: Not very hard  . Food insecurity    Worry: Never true    Inability: Never true  . Transportation needs    Medical: No    Non-medical: No  Tobacco Use  . Smoking status: Never Smoker  . Smokeless tobacco: Never Used  Substance and Sexual Activity  . Alcohol use: No  . Drug use: No  . Sexual activity: Not Currently  Lifestyle  . Physical activity    Days per week: 3 days    Minutes per session: 10 min  . Stress: Only a little  Relationships  . Social connections    Talks on phone: More than three times a week    Gets together: More than three  times a week    Attends religious service: More than 4 times per year    Active member of club or organization: Yes    Attends meetings of clubs or organizations: More than 4 times per year    Relationship status: Widowed  Other Topics Concern  . Not on file  Social History Narrative   Pt lives at home alone   Husband w/ Alzheimer's passed away   Right-handed   Drinks 1-2 cups of coffee daily     Family History  Problem Relation Age of Onset  . Pneumonia Mother   . Brain cancer Father   . Tremor Father   . Arthritis Sister   . COPD Sister   . Emphysema Brother   . COPD Brother   . Tremor Brother   . Lung cancer Brother   . Heart disease Brother   . Liver cancer Brother   . Diabetes Other         1 child has diabetes at age 22 and the other 4 are healthy      Labs/Other Tests and Data Reviewed:    Wt Readings from Last 3 Encounters:  01/12/19 157 lb (71.2 kg)  10/29/18 158 lb (71.7 kg)  10/25/18 160 lb (72.6 kg)   Temp Readings from Last 3 Encounters:  01/12/19 97.8 F (36.6 C) (Oral)  10/29/18 98.5 F (36.9 C)  10/25/18 97.8 F (36.6 C) (Oral)   BP Readings from Last 3 Encounters:  01/12/19 140/82  10/29/18 138/80  10/25/18 123/77   Pulse Readings from Last 3 Encounters:  01/12/19 82  10/29/18 80  10/25/18 73     Lab Results  Component Value Date   HGBA1C 8.4 (H) 09/03/2018   HGBA1C 7.4 (H) 07/08/2018   HGBA1C 7.6 (H) 04/06/2018   Lab Results  Component Value Date   MICROALBUR NEGATIVE 09/01/2014   Bond 66 09/03/2018  CREATININE 0.54 (L) 10/04/2018       Chemistry      Component Value Date/Time   NA 133 (L) 10/04/2018 1530   K 4.8 10/04/2018 1530   CL 95 (L) 10/04/2018 1530   CO2 21 10/04/2018 1530   BUN 10 10/04/2018 1530   CREATININE 0.54 (L) 10/04/2018 1530   CREATININE 0.77 03/14/2013 1104      Component Value Date/Time   CALCIUM 9.3 10/04/2018 1530   ALKPHOS 67 09/03/2018 1050   AST 14 09/03/2018 1050   ALT 20 09/03/2018  1050   BILITOT 0.6 09/03/2018 1050         OBSERVATIONS/ OBJECTIVE:     The patient is pleasant and alert.  Her weight is about 139 pounds she says.  She checks her blood sugars all during the day and they seem to run between 149 and up to 210.  She does not check any blood sugars fasting.  She says her feet have no sign of any infection redness or drainage.  She wears her support hose daily and this helps with the edema.  Based on what she is telling me I think she sees Dr. Herma Mering in [place] for the injections.  She did have an MRI of the cervical spine which should multilevel cervical spondylosis especially at C4-5 and C5 and 6 with stenosis and foraminal narrowing.  She is to come to the office to get fasting blood work and will have the nurse to call and let her know when and where to come to make sure that she wears protective equipment when she comes.  She will start checking her blood sugars fasting also which she has not been doing.  Physical exam deferred due to nature of telephonic visit.  ASSESSMENT & PLAN    Time:   Today, I have spent 36 minutes with the patient via telephone discussing the above including Covid precautions.     Visit Diagnoses: 1. Vitamin D deficiency -Continue with current treatment pending results of lab work  2. Gastroesophageal reflux disease, esophagitis presence not specified -The patient is not having any trouble swallowing but is having a lot of indigestion despite taking Prilosec twice daily.  Her daughter is made arrangements for her to see a stomach specialist in [place] and I am not sure who that is at this time.  We would asked that we get a copy of the note from that specialist once the specialist has examined her.  3. Controlled type 2 diabetes mellitus without complication, without long-term current use of insulin ([place]) -Continue with current treatment pending results of lab work and the patient is asked to check some fasting blood  sugars periodically.  4. Essential hypertension -Check blood pressures at home if possible and bring readings to the office.  Watch sodium intake and drink plenty of water  5. Cervical disc disease -Follow-up with specialist and if necessary continue further injections  6. Neural foraminal stenosis of cervical spine -Follow-up with orthopedic or neurosurgical specialist in [place] as planned  7. Type 2 diabetes mellitus with hyperlipidemia ([place]) -Continue as aggressive therapeutic lifestyle changes and metformin as currently doing and watch diet as closely as possible  8. Diabetic peripheral neuropathy (HCC) -Continue good blood sugar control with diet and exercise and continue to be careful not put self at risk for falling  9. Primary osteoarthritis involving multiple joints -Continue with extra strength Tylenol and continue to be careful not put self at risk for falling  10. Spinal  stenosis of lumbar region, unspecified whether neurogenic claudication present -Follow-up with specialist in [place].  Recent MRI of cervical spine was reviewed and was reviewed also with the patient.  11. Keratoconjunctivitis sicca of both eyes ([place]) -Follow-up with ophthalmologist as planned  12. Hyperlipidemia associated with type 2 diabetes mellitus ([place]) -Continue with statin therapy and with as aggressive therapeutic lifestyle changes as possible  Patient Instructions  Follow-up with gastroenterologist as planned Follow-up with urologist as planned Follow-up with cardiologist as planned Follow-up with orthopedist/neurosurgery as planned for future injections Continue to drink plenty of fluids and stay well-hydrated Check blood sugars fasting and bring these readings with you when you come to the office Check blood pressures if possible and bring these readings also with you when you come to the office Continue to practice good hand and respiratory hygiene     The above assessment and  management plan was discussed with the patient. The patient verbalized understanding of and has agreed to the management plan. Patient is aware to call the clinic if symptoms persist or worsen. Patient is aware when to return to the clinic for a follow-up visit. Patient educated on when it is appropriate to go to the emergency department.    Chipper Herb, MD [facility], [place], [place] [address] Ph [phone]   Arrie Senate MD

## 2019-02-03 NOTE — Patient Instructions (Signed)
Follow-up with gastroenterologist as planned Follow-up with urologist as planned Follow-up with cardiologist as planned Follow-up with orthopedist/neurosurgery as planned for future injections Continue to drink plenty of fluids and stay well-hydrated Check blood sugars fasting and bring these readings with you when you come to the office Check blood pressures if possible and bring these readings also with you when you come to the office Continue to practice good hand and respiratory hygiene

## 2019-02-16 DIAGNOSIS — M47812 Spondylosis without myelopathy or radiculopathy, cervical region: Secondary | ICD-10-CM | POA: Diagnosis not present

## 2019-02-17 DIAGNOSIS — L579 Skin changes due to chronic exposure to nonionizing radiation, unspecified: Secondary | ICD-10-CM | POA: Diagnosis not present

## 2019-02-17 DIAGNOSIS — D1801 Hemangioma of skin and subcutaneous tissue: Secondary | ICD-10-CM | POA: Diagnosis not present

## 2019-02-17 DIAGNOSIS — L565 Disseminated superficial actinic porokeratosis (DSAP): Secondary | ICD-10-CM | POA: Diagnosis not present

## 2019-02-17 DIAGNOSIS — D225 Melanocytic nevi of trunk: Secondary | ICD-10-CM | POA: Diagnosis not present

## 2019-02-17 DIAGNOSIS — L821 Other seborrheic keratosis: Secondary | ICD-10-CM | POA: Diagnosis not present

## 2019-02-17 DIAGNOSIS — L570 Actinic keratosis: Secondary | ICD-10-CM | POA: Diagnosis not present

## 2019-02-23 ENCOUNTER — Other Ambulatory Visit: Payer: Self-pay | Admitting: *Deleted

## 2019-02-23 MED ORDER — OMEPRAZOLE 40 MG PO CPDR: 40.0000 mg | DELAYED_RELEASE_CAPSULE | Freq: Two times a day (BID) | ORAL | 1 refills | Status: DC

## 2019-02-28 DIAGNOSIS — M47812 Spondylosis without myelopathy or radiculopathy, cervical region: Secondary | ICD-10-CM | POA: Diagnosis not present

## 2019-03-04 ENCOUNTER — Other Ambulatory Visit: Payer: Self-pay | Admitting: *Deleted

## 2019-03-04 MED ORDER — PRAVASTATIN SODIUM 40 MG PO TABS: 40.0000 mg | ORAL_TABLET | Freq: Every day | ORAL | 0 refills | Status: DC

## 2019-03-04 MED ORDER — LISINOPRIL 40 MG PO TABS: 40.0000 mg | ORAL_TABLET | Freq: Every day | ORAL | 0 refills | Status: DC

## 2019-03-10 ENCOUNTER — Other Ambulatory Visit: Payer: Self-pay

## 2019-03-11 ENCOUNTER — Encounter: Payer: Self-pay | Admitting: Family Medicine

## 2019-03-11 ENCOUNTER — Ambulatory Visit (INDEPENDENT_AMBULATORY_CARE_PROVIDER_SITE_OTHER): Payer: Medicare Other | Admitting: Family Medicine

## 2019-03-11 VITALS — BP 134/84 | HR 76 | Temp 97.7°F | Ht 60.0 in | Wt 153.0 lb

## 2019-03-11 DIAGNOSIS — E1142 Type 2 diabetes mellitus with diabetic polyneuropathy: Secondary | ICD-10-CM | POA: Diagnosis not present

## 2019-03-11 DIAGNOSIS — R296 Repeated falls: Secondary | ICD-10-CM

## 2019-03-11 DIAGNOSIS — I10 Essential (primary) hypertension: Secondary | ICD-10-CM

## 2019-03-11 DIAGNOSIS — E119 Type 2 diabetes mellitus without complications: Secondary | ICD-10-CM | POA: Diagnosis not present

## 2019-03-11 DIAGNOSIS — E785 Hyperlipidemia, unspecified: Secondary | ICD-10-CM | POA: Diagnosis not present

## 2019-03-11 DIAGNOSIS — E1169 Type 2 diabetes mellitus with other specified complication: Secondary | ICD-10-CM

## 2019-03-11 DIAGNOSIS — R2689 Other abnormalities of gait and mobility: Secondary | ICD-10-CM | POA: Diagnosis not present

## 2019-03-11 DIAGNOSIS — E1159 Type 2 diabetes mellitus with other circulatory complications: Secondary | ICD-10-CM | POA: Diagnosis not present

## 2019-03-11 LAB — BAYER DCA HB A1C WAIVED: HB A1C (BAYER DCA - WAIVED): 7.5 % — ABNORMAL HIGH (ref <7.0)

## 2019-03-11 NOTE — Progress Notes (Signed)
Subjective: CC: DM PCP: Chipper Herb, MD [id number] Debra Phillips is a 85 y.o. female presenting to clinic today for:  1. Type 2 Diabetes w/ HTN, HLD:  Patient reports that she checks her blood sugars 1-2 times daily.  High at home: 200; Low at home: 100s.  No hypoglycemic episodes, Taking medication(s): Metformin 1000 mg twice daily, pravastatin 40 mg daily, lisinopril 40 mg daily and Cardizem 180 mg daily,.  Last eye exam: Needs Last foot exam: Up-to-date Last A1c:  Lab Results  Component Value Date   HGBA1C 8.4 (H) 09/03/2018   Nephropathy screen indicated?: on ACE-I Last flu, zoster and/or pneumovax:  Immunization History  Administered Date(s) Administered  . Influenza, High Dose Seasonal PF 06/13/2016, 06/18/2017, 06/29/2018  . Influenza,inj,Quad PF,6+ Mos 06/15/2015  . Influenza-Unspecified 04/25/2013, 05/10/2014  . Pneumococcal Conjugate-13 08/24/2013  . Pneumococcal Polysaccharide-23 06/17/1996  . Td 02/17/2017  . Tdap 02/17/2017  . Zoster 03/17/2014    ROS: Denies dizziness, LOC, polyuria, polydipsia, unintended weight loss/gain, foot ulcerations, numbness or tingling in extremities, shortness of breath or chest pain.  2.  Balance issues Patient does report frequent falls and balance issues.  She notes that she lost her footing while in the yard tending to her vegetables recently and fell against a fence.  She does live by herself though does have family that lives nearby that can help her.  Denies preceding dizziness.    ROS: Per HPI  Allergies  Allergen Reactions  . Codeine Nausea And Vomiting  . Doxycycline Hives  . Other Nausea Only    Almost all antibiotics  . Sulfonamide Derivatives Hives  . Vancomycin Itching   Past Medical History:  Diagnosis Date  . Abnormality of gait 01/29/2016  . Aortic root dilatation (HCC)    Mild  . Cataract   . Diabetes mellitus without complication ([place])    type 2  . Dyslipidemia   . Dyspnea    with exertion  .  Dysrhythmia    hx of occ palpitations  . Esophageal stricture    s/p dilation  . GERD (gastroesophageal reflux disease)   . Headache   . History of hiatal hernia   . Hyperlipidemia   . Hypertension   . Macular pucker, left eye   . Osteoarthritis   . Osteoporosis   . Tinnitus    left ear last week or so    Current Outpatient Medications:  .  aspirin EC 81 MG tablet, Take 81 mg by mouth daily., Disp: , Rfl:  .  Calcium Carb-Cholecalciferol 600-500 MG-UNIT CAPS, Take 1 capsule by mouth every morning., Disp: , Rfl:  .  Cholecalciferol 1000 UNITS tablet, Take 1,000 Units by mouth daily. , Disp: , Rfl:  .  diltiazem (CARDIZEM CD) 180 MG 24 hr capsule, TAKE (1) CAPSULE DAILY, Disp: 90 capsule, Rfl: 0 .  ESTRACE VAGINAL 0.1 MG/GM vaginal cream, Place 1 Applicatorful vaginally once a week., Disp: 42.5 g, Rfl: 1 .  hydroxypropyl methylcellulose / hypromellose (ISOPTO TEARS / GONIOVISC) 2.5 % ophthalmic solution, Place 1 drop into both eyes 3 (three) times daily as needed for dry eyes., Disp: , Rfl:  .  Lancets (ONETOUCH ULTRASOFT) lancets, CHECK BLOOD SUGAR ONCE A DAY, Disp: 50 each, Rfl: 9 .  lisinopril (ZESTRIL) 40 MG tablet, Take 1 tablet (40 mg total) by mouth daily., Disp: 30 tablet, Rfl: 0 .  metFORMIN (GLUCOPHAGE) 1000 MG tablet, Take 1 tablet (1,000 mg total) by mouth 2 (two) times daily with a meal., Disp:  180 tablet, Rfl: 3 .  MYRBETRIQ 25 MG TB24 tablet, Take 1 tablet (25 mg total) by mouth daily., Disp: 30 tablet, Rfl: 0 .  nitroGLYCERIN (NITROSTAT) 0.4 MG SL tablet, Place 1 tablet (0.4 mg total) under the tongue every 5 (five) minutes as needed for chest pain., Disp: 25 tablet, Rfl: 3 .  omeprazole (PRILOSEC) 40 MG capsule, Take 1 capsule (40 mg total) by mouth 2 (two) times daily., Disp: 180 capsule, Rfl: 1 .  ONE TOUCH ULTRA TEST test strip, CHECK BLOOD SUGAR ONCE A DAY, Disp: 50 each, Rfl: 11 .  pravastatin (PRAVACHOL) 40 MG tablet, Take 1 tablet (40 mg total) by mouth daily.,  Disp: 30 tablet, Rfl: 0 Social History   Socioeconomic History  . Marital status: Widowed    Spouse name: Not on file  . Number of children: 5  . Years of education: 5  . Highest education level: Not on file  Occupational History  . 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 Financial resource strain: Not very hard  . Food insecurity    Worry: Never true    Inability: Never true  . Transportation needs    Medical: No    Non-medical: No  Tobacco Use  . Smoking status: Never Smoker  . Smokeless tobacco: Never Used  Substance and Sexual Activity  . Alcohol use: No  . Drug use: No  . Sexual activity: Not Currently  Lifestyle  . Physical activity    Days per week: 3 days    Minutes per session: 10 min  . Stress: Only a little  Relationships  . Social connections    Talks on phone: More than three times a week    Gets together: More than three times a week    Attends religious service: More than 4 times per year    Active member of club or organization: Yes    Attends meetings of clubs or organizations: More than 4 times per year    Relationship status: Widowed  . Intimate partner violence    Fear of current or ex partner: No    Emotionally abused: No    Physically abused: No    Forced sexual activity: No  Other Topics Concern  . Not on file  Social History Narrative   Pt lives at home alone   Husband w/ Alzheimer's passed away   Right-handed   Drinks 1-2 cups of coffee daily   Family History  Problem Relation Age of Onset  . Pneumonia Mother   . Brain cancer Father   . Tremor Father   . Arthritis Sister   . COPD Sister   . Emphysema Brother   . COPD Brother   . Tremor Brother   . Lung cancer Brother   . Heart disease Brother   . Liver cancer Brother   . Diabetes Other         1 child has diabetes at age 5 and the other 4 are healthy    Objective: Office vital signs reviewed. BP 134/84   Pulse 76    Temp 97.7 F (36.5 C) (Oral)   Ht 5' (1.524 m)   Wt 153 lb (69.4 kg)   BMI 29.88 kg/m   Physical Examination:  General: Awake, alert, well nourished, well appearing. No acute distress HEENT: Normal, sclera white, MMM Cardio: regular rate and rhythm, S1S2 heard, no murmurs appreciated Pulm: clear to auscultation bilaterally, no wheezes,  rhonchi or rales; normal work of breathing on room air Extremities: warm, well perfused, No edema, cyanosis or clubbing; +2 pulses bilaterally MSK: slow gait and station; uses cane for ambulation Skin: Healing ecchymosis noted along the right forearm  Assessment/ Plan: 85 y.o. female   1. Controlled type 2 diabetes mellitus without complication, without long-term current use of insulin (HCC) A1c controlled for age and disability.  Her A1c today was 7.5.  This is an improvement from previous above 8.  Continue metformin 1000 mg twice daily.  Check BMP - Bayer DCA Hb A1c Waived - Basic Metabolic Panel  2. Diabetic peripheral neuropathy (HCC) Stable  3. Hyperlipidemia associated with type 2 diabetes mellitus (HCC) Stable.  Repeat lipid not due until 2021  4. Hypertension associated with diabetes ([place]) Stable.  Continue current regimen - Basic Metabolic Panel  5. Poor balance Likely secondary to deconditioning.  Referral to physical therapy for balance training.  Reinforced compliance with cane - Ambulatory referral to Physical Therapy  6. Frequent falls - Ambulatory referral to Physical Therapy   Orders Placed This Encounter  Procedures  . Bayer DCA Hb A1c Waived  . Basic Metabolic Panel   No orders of the defined types were placed in this encounter.    [name], DO [place] [phone]

## 2019-03-11 NOTE — Patient Instructions (Addendum)
You had labs performed today.  You will be contacted with the results of the labs once they are available, usually in the next 3 business days for routine lab work.  If you have an active my chart account, they will be released to your MyChart.  If you prefer to have these labs released to you via telephone, please let us know.  Sugar looks fine.  Keep taking your Metformin.  I ordered physical therapy next door to help with your balance and decrease falls.

## 2019-03-12 LAB — BASIC METABOLIC PANEL
BUN/Creatinine Ratio: 21 (ref 12–28)
BUN: 15 mg/dL (ref 8–27)
CO2: 24 mmol/L (ref 20–29)
Calcium: 9.5 mg/dL (ref 8.7–10.3)
Chloride: 92 mmol/L — ABNORMAL LOW (ref 96–106)
Creatinine, Ser: 0.73 mg/dL (ref 0.57–1.00)
GFR calc Af Amer: 85 mL/min/{1.73_m2} (ref >59)
GFR calc non Af Amer: 74 mL/min/{1.73_m2} (ref >59)
Glucose: 135 mg/dL — ABNORMAL HIGH (ref 65–99)
Potassium: 4.6 mmol/L (ref 3.5–5.2)
Sodium: 130 mmol/L — ABNORMAL LOW (ref 134–144)

## 2019-03-16 ENCOUNTER — Ambulatory Visit (INDEPENDENT_AMBULATORY_CARE_PROVIDER_SITE_OTHER): Payer: Medicare Other | Admitting: Family Medicine

## 2019-03-16 ENCOUNTER — Encounter: Payer: Self-pay | Admitting: Family Medicine

## 2019-03-16 ENCOUNTER — Ambulatory Visit (HOSPITAL_COMMUNITY)
Admission: RE | Admit: 2019-03-16 | Discharge: 2019-03-16 | Disposition: A | Discharge status: Home / Self Care | Payer: Medicare Other | Source: Ambulatory Visit | Attending: Family Medicine | Admitting: Family Medicine

## 2019-03-16 ENCOUNTER — Other Ambulatory Visit: Payer: Self-pay

## 2019-03-16 VITALS — BP 142/88 | HR 98 | Temp 98.0°F | Ht 60.0 in | Wt 153.0 lb

## 2019-03-16 DIAGNOSIS — M25532 Pain in left wrist: Secondary | ICD-10-CM | POA: Insufficient documentation

## 2019-03-16 DIAGNOSIS — S51012A Laceration without foreign body of left elbow, initial encounter: Secondary | ICD-10-CM

## 2019-03-16 DIAGNOSIS — W19XXXA Unspecified fall, initial encounter: Secondary | ICD-10-CM | POA: Diagnosis not present

## 2019-03-16 DIAGNOSIS — S7002XA Contusion of left hip, initial encounter: Secondary | ICD-10-CM

## 2019-03-16 DIAGNOSIS — M25522 Pain in left elbow: Secondary | ICD-10-CM | POA: Diagnosis not present

## 2019-03-16 DIAGNOSIS — S79912A Unspecified injury of left hip, initial encounter: Secondary | ICD-10-CM | POA: Diagnosis not present

## 2019-03-16 DIAGNOSIS — M25552 Pain in left hip: Secondary | ICD-10-CM | POA: Diagnosis not present

## 2019-03-16 DIAGNOSIS — S59902A Unspecified injury of left elbow, initial encounter: Secondary | ICD-10-CM | POA: Diagnosis not present

## 2019-03-16 DIAGNOSIS — M7989 Other specified soft tissue disorders: Secondary | ICD-10-CM | POA: Diagnosis not present

## 2019-03-16 NOTE — Patient Instructions (Signed)
Your laceration on the elbow was repaired with steri strips today.  Please leave these on for the next 5 days.  They should come off on their own but if they don't you can remove them in 5 days.  Do not swim or bathe in a bathtub for 1 week.  Ok to shower normally. Keep areas clean with a mild soap.  Please go to [facility] for [employer].  Go to the front enterance and let them know you are there for xray.  I will call you with the results.   Laceration Care, Adult A laceration is a cut that may go through all layers of the skin. The cut may also go into the tissue that is right under the skin. Some cuts heal on their own. Others need to be closed with stitches (sutures), staples, skin adhesive strips, or skin glue. Taking care of your injury lowers your risk of infection, helps your injury to heal better, and may prevent scarring. Supplies needed:  Soap.  Water.  Hand sanitizer.  Bandage (dressing).  Antibiotic ointment.  Clean towel. How to take care of your cut Wash your hands with soap and water before touching your wound or changing your bandage. If soap and water are not available, use hand sanitizer. If your doctor used stitches or staples:  Keep the wound clean and dry.  If you were given a bandage, change it at least once a day as told by your doctor. You should also change it if it gets wet or dirty.  Keep the wound completely dry for the first 24 hours, or as told by your doctor. After that, you may take a shower or a bath. Do not get the wound soaked in water until after the stitches or staples have been removed.  Clean the wound once a day, or as told by your doctor: ? Wash the wound with soap and water. ? Rinse the wound with water to remove all soap. ? Pat the wound dry with a clean towel. Do not rub the wound.  After you clean the wound, put a thin layer of antibiotic ointment on it as told by your doctor. This ointment: ? Helps to prevent infection. ? Keeps the  bandage from sticking to the wound.  Have your stitches or staples removed as told by your doctor. If your doctor used skin adhesive strips:  Keep the wound clean and dry.  If you were given a bandage, you should change it at least once a day as told by your doctor. You should also change it if it gets wet or dirty.  Do not get the skin adhesive strips wet. You can take a shower or a bath, but keep the wound dry.  If the wound gets wet, pat it dry with a clean towel. Do not rub the wound.  Skin adhesive strips fall off on their own. You can trim the strips as the wound heals. Do not remove any strips that are still stuck to the wound. They will fall off after a while. If your doctor used skin glue:  Try to keep your wound dry, but you may briefly wet it in the shower or bath. Do not soak the wound in water, such as by swimming.  After you take a shower or a bath, gently pat the wound dry with a clean towel. Do not rub the wound.  Do not do any activities that will make you really sweaty until the skin glue has fallen  off on its own.  Do not apply liquid, cream, or ointment medicine to your wound while the skin glue is still on.  If you were given a bandage, you should change it at least once a day or as told by your doctor. You should also change it if it gets dirty or wet.  If a bandage is placed over the wound, do not let the tape touch the skin glue.  Do not pick at the glue. The skin glue usually stays on for 5-10 days. Then, it falls off the skin. General instructions   Take over-the-counter and prescription medicines only as told by your doctor.  If you were given antibiotic medicine or ointment, take or apply it as told by your doctor. Do not stop using it even if your condition improves.  Do not scratch or pick at the wound.  Check your wound every day for signs of infection. Watch for: ? Redness, swelling, or pain. ? Fluid, blood, or pus.  Raise (elevate) the  injured area above the level of your heart while you are sitting or lying down.  If directed, put ice on the affected area: ? Put ice in a plastic bag. ? Place a towel between your skin and the bag. ? Leave the ice on for 20 minutes, 2-3 times a day.  Prevent scarring by covering your wound with sunscreen of at least 30 SPF whenever you are outside after your wound has healed.  Keep all follow-up visits as told by your doctor. This is important. Get help if:  You got a tetanus shot and you have any of these problems at the injection site: ? Swelling. ? Very bad pain. ? Redness. ? Bleeding.  You have a fever.  A wound that was closed breaks open.  You notice a bad smell coming from your wound or your bandage.  You notice something coming out of the wound, such as wood or glass.  Medicine does not relieve your pain.  You have more redness, swelling, or pain at the site of your wound.  You have fluid, blood, or pus coming from your wound.  You notice a change in the color of your skin near your wound.  You need to change the bandage often because fluid, blood, or pus is coming from the wound.  You start to have a new rash.  You start to have numbness around the wound. Get help right away if:  You have very bad swelling around the wound.  Your pain suddenly gets worse and is very bad.  You notice painful lumps near the wound or anywhere on your body.  You have a red streak going away from your wound.  The wound is on your hand or foot, and: ? You cannot move a finger or toe. ? Your fingers or toes look pale or bluish. Summary  A laceration is a cut that may go through all layers of the skin. The cut may also go into the tissue right under the skin.  Some cuts heal on their own. Others need to be closed with stitches, staples, skin adhesive strips, or skin glue.  Follow your doctor's instructions for caring for your cut. Proper care of a cut lowers the risk of  infection, helps the cut heal better, and prevents scarring. This information is not intended to replace advice given to you by your health care provider. Make sure you discuss any questions you have with your health care provider. Document Released: 01/28/2008  Document Revised: 10/09/2017 Document Reviewed: 08/31/2017 Elsevier Patient Education  [place].  Hematoma A hematoma is a collection of blood. A hematoma can happen:  Under the skin.  In an organ.  In a body space.  In a joint space.  In other tissues. The blood can thicken (clot) to form a lump that you can see and feel. The lump is often hard and may become sore and tender. The lump can be very small or very big. Most hematomas get better in a few days to weeks. However, some hematomas may be serious and need medical care. What are the causes? This condition is caused by:  An injury.  Blood that leaks under the skin.  Problems from surgeries.  Medical conditions that cause bleeding or bruising. What increases the risk? You are more likely to develop this condition if:  You are an older adult.  You use medicines that thin your blood. What are the signs or symptoms? Symptoms depend on where the hematoma is in your body.  If the hematoma is under the skin, there is: ? A firm lump on the body. ? Pain and tenderness in the area. ? Bruising. The skin above the lump may be blue, dark blue, purple-red, or yellowish.  If the hematoma is deep in the tissues or body spaces, there may be: ? Blood in the stomach. This may cause pain in the belly (abdomen), weakness, passing out (fainting), and shortness of breath. ? Blood in the head. This may cause a headache, weakness, trouble speaking or understanding speech, or passing out. How is this diagnosed? This condition is diagnosed based on:  Your medical history.  A physical exam.  Imaging tests, such as ultrasound or CT scan.  Blood tests. How is this  treated? Treatment depends on the cause, size, and location of the hematoma. Treatment may include:  Doing nothing. Many hematomas go away on their own without treatment.  Surgery or close monitoring. This may be needed for large hematomas or hematomas that affect the body's organs.  Medicines. These may be given if a medical condition caused the hematoma. Follow these instructions at home: Managing pain, stiffness, and swelling   If told, put ice on the area. ? Put ice in a plastic bag. ? Place a towel between your skin and the bag. ? Leave the ice on for 20 minutes, 2-3 times a day for the first two days.  If told, put heat on the affected area after putting ice on the area for two days. Use the heat source that your doctor tells you to use. This could be a moist heat pack or a heating pad. To do this: ? Place a towel between your skin and the heat source. ? Leave the heat on for 20-30 minutes. ? Remove the heat if your skin turns bright red. This is very important if you are unable to feel pain, heat, or cold. You may have a greater risk of getting burned.  Raise (elevate) the affected area above the level of your heart while you are sitting or lying down.  Wrap the affected area with an elastic bandage, if told by your doctor. Do not wrap the bandage too tightly.  If your hematoma is on a leg or foot and is painful, your doctor may give you crutches. Use them as told by your doctor. General instructions  Take over-the-counter and prescription medicines only as told by your doctor.  Keep all follow-up visits as told by  your doctor. This is important. Contact a doctor if:  You have a fever.  The swelling or bruising gets worse.  You start to get more hematomas. Get help right away if:  Your pain gets worse.  Your pain is not getting better with medicine.  Your skin over the hematoma breaks or starts to bleed.  Your hematoma is in your chest or belly and you: ? Pass  out. ? Feel weak. ? Become short of breath.  You have a hematoma on your scalp that is caused by a fall or injury, and you: ? Have a headache that gets worse. ? Have trouble speaking or understanding speech. ? Become less alert or you pass out. Summary  A hematoma is a collection of blood in any part of your body.  Most hematomas get better on their own in a few days to weeks. Some may need medical care.  Follow instructions from your doctor about how to care for your hematoma.  Contact a doctor if the swelling or bruising gets worse, or if you are short of breath. This information is not intended to replace advice given to you by your health care provider. Make sure you discuss any questions you have with your health care provider. Document Released: 09/18/2004 Document Revised: 01/14/2018 Document Reviewed: 01/14/2018 Elsevier Patient Education  2020 [employer].

## 2019-03-16 NOTE — Progress Notes (Signed)
Subjective: CC: fall PCP: [name], DO BCW:[id number] Debra Phillips is a 85 y.o. female presenting to clinic today for:  1. Fall Patient reports she sustained a mechanical fall while working in her garden earlier this morning.  She fell onto her left side onto her buttock and with an outstretched arm.  She notes having cut her elbow on the left side and that her left buttock/hip area hurts.  She is ambulating independently and drove herself to the office.  No treatments thus far.  She does have history of fracture of the left wrist and right elbow in the past which required surgical intervention.  She was actually supposed to start physical therapy for balance training tomorrow.  She denies having hit her head.  No dizziness or visual disturbance.   ROS: Per HPI  Allergies  Allergen Reactions  . Codeine Nausea And Vomiting  . Doxycycline Hives  . Other Nausea Only    Almost all antibiotics  . Sulfonamide Derivatives Hives  . Vancomycin Itching   Past Medical History:  Diagnosis Date  . Abnormality of gait 01/29/2016  . Aortic root dilatation (HCC)    Mild  . Cataract   . Diabetes mellitus without complication ([place])    type 2  . Dyslipidemia   . Dyspnea    with exertion  . Dysrhythmia    hx of occ palpitations  . Esophageal stricture    s/p dilation  . GERD (gastroesophageal reflux disease)   . Headache   . History of hiatal hernia   . Hyperlipidemia   . Hypertension   . Macular pucker, left eye   . Osteoarthritis   . Osteoporosis   . Tinnitus    left ear last week or so    Current Outpatient Medications:  .  aspirin EC 81 MG tablet, Take 81 mg by mouth daily., Disp: , Rfl:  .  Calcium Carb-Cholecalciferol 600-500 MG-UNIT CAPS, Take 1 capsule by mouth every morning., Disp: , Rfl:  .  Cholecalciferol 1000 UNITS tablet, Take 1,000 Units by mouth daily. , Disp: , Rfl:  .  diltiazem (CARDIZEM CD) 180 MG 24 hr capsule, TAKE (1) CAPSULE DAILY, Disp: 90 capsule, Rfl:  0 .  ESTRACE VAGINAL 0.1 MG/GM vaginal cream, Place 1 Applicatorful vaginally once a week., Disp: 42.5 g, Rfl: 1 .  hydroxypropyl methylcellulose / hypromellose (ISOPTO TEARS / GONIOVISC) 2.5 % ophthalmic solution, Place 1 drop into both eyes 3 (three) times daily as needed for dry eyes., Disp: , Rfl:  .  Lancets (ONETOUCH ULTRASOFT) lancets, CHECK BLOOD SUGAR ONCE A DAY, Disp: 50 each, Rfl: 9 .  lisinopril (ZESTRIL) 40 MG tablet, Take 1 tablet (40 mg total) by mouth daily., Disp: 30 tablet, Rfl: 0 .  metFORMIN (GLUCOPHAGE) 1000 MG tablet, Take 1 tablet (1,000 mg total) by mouth 2 (two) times daily with a meal., Disp: 180 tablet, Rfl: 3 .  MYRBETRIQ 25 MG TB24 tablet, Take 1 tablet (25 mg total) by mouth daily., Disp: 30 tablet, Rfl: 0 .  nitroGLYCERIN (NITROSTAT) 0.4 MG SL tablet, Place 1 tablet (0.4 mg total) under the tongue every 5 (five) minutes as needed for chest pain., Disp: 25 tablet, Rfl: 3 .  omeprazole (PRILOSEC) 40 MG capsule, Take 1 capsule (40 mg total) by mouth 2 (two) times daily., Disp: 180 capsule, Rfl: 1 .  ONE TOUCH ULTRA TEST test strip, CHECK BLOOD SUGAR ONCE A DAY, Disp: 50 each, Rfl: 11 .  pravastatin (PRAVACHOL) 40 MG tablet, Take  1 tablet (40 mg total) by mouth daily., Disp: 30 tablet, Rfl: 0 Social History   Socioeconomic History  . Marital status: Widowed    Spouse name: Not on file  . Number of children: 5  . Years of education: 5  . Highest education level: Not on file  Occupational History  . 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 Financial resource strain: Not very hard  . Food insecurity    Worry: Never true    Inability: Never true  . Transportation needs    Medical: No    Non-medical: No  Tobacco Use  . Smoking status: Never Smoker  . Smokeless tobacco: Never Used  Substance and Sexual Activity  . Alcohol use: No  . Drug use: No  . Sexual activity: Not Currently  Lifestyle  . Physical  activity    Days per week: 3 days    Minutes per session: 10 min  . Stress: Only a little  Relationships  . Social connections    Talks on phone: More than three times a week    Gets together: More than three times a week    Attends religious service: More than 4 times per year    Active member of club or organization: Yes    Attends meetings of clubs or organizations: More than 4 times per year    Relationship status: Widowed  . Intimate partner violence    Fear of current or ex partner: No    Emotionally abused: No    Physically abused: No    Forced sexual activity: No  Other Topics Concern  . Not on file  Social History Narrative   Pt lives at home alone   Husband w/ Alzheimer's passed away   Right-handed   Drinks 1-2 cups of coffee daily   Family History  Problem Relation Age of Onset  . Pneumonia Mother   . Brain cancer Father   . Tremor Father   . Arthritis Sister   . COPD Sister   . Emphysema Brother   . COPD Brother   . Tremor Brother   . Lung cancer Brother   . Heart disease Brother   . Liver cancer Brother   . Diabetes Other         1 child has diabetes at age 81 and the other 4 are healthy    Objective: Office vital signs reviewed. BP (!) 142/88   Pulse 98   Temp 98 F (36.7 C) (Oral)   Ht 5' (1.524 m)   Wt 153 lb (69.4 kg)   BMI 29.88 kg/m   Physical Examination:  General: Awake, alert, well nourished, No acute distress HEENT: PERRL, EOMI, [place]/AT MSK: slow, antalgic gait. Uses cane; left hip with large hematoma along the posterior lateral aspect. Skin: ~1.25 inch curvilinear hemostatic laceration noted over the left elbow.   Procedure: simple laceration repair. Verbal consent obtained. Risks/ benefits reviewed with patient. Area cleaned with saline. No foreign bodies found.  Area was approximated using steristrips x5.   Assessment/ Plan: 85 y.o. female   1. Laceration of left elbow, initial encounter Does not appear complicated and was  repaired with Steri-Strips today.  She is up-to-date on her tetanus vaccination.  Will obtain x-ray of the left elbow given history of fracture of the right elbow that required surgical repair in the past.  Patient is frail.  She will get this [facility] at any [facility].  I will contact her with results once available. - DG Wrist Complete Left; Future  2. Hematoma of left hip, initial encounter Grapefruit size hematoma noted on the left posterior hip today.  Will obtain x-ray of the left hip given fall and tenderness to palpation. - DG Hip Unilat W OR W/O Pelvis 2-3 Views Left; Future  3. Fall, initial encounter - DG Wrist Complete Left; Future - DG Hip Unilat W OR W/O Pelvis 2-3 Views Left; Future - DG Elbow 2 Views Left; Future  4. Left wrist pain Check left wrist with x-ray.  She has pain with range of motion.  No significant swelling noted on today's exam but has history of complicated fracture in the left wrist previously that required surgical intervention. - DG Elbow 2 Views Left; Future   No orders of the defined types were placed in this encounter.  No orders of the defined types were placed in this encounter.    [name], DO [place] [phone]

## 2019-03-17 ENCOUNTER — Ambulatory Visit: Payer: Medicare Other | Attending: Family Medicine | Admitting: Physical Therapy

## 2019-03-17 ENCOUNTER — Other Ambulatory Visit: Payer: Self-pay | Admitting: Family Medicine

## 2019-03-17 DIAGNOSIS — S66802A Unspecified injury of other specified muscles, fascia and tendons at wrist and hand level, left hand, initial encounter: Secondary | ICD-10-CM

## 2019-03-18 ENCOUNTER — Telehealth: Payer: Self-pay | Admitting: Family Medicine

## 2019-03-18 ENCOUNTER — Ambulatory Visit (HOSPITAL_COMMUNITY)
Admission: RE | Admit: 2019-03-18 | Discharge: 2019-03-18 | Disposition: A | Discharge status: Home / Self Care | Payer: Medicare Other | Source: Ambulatory Visit | Attending: Physician Assistant | Admitting: Physician Assistant

## 2019-03-18 ENCOUNTER — Other Ambulatory Visit: Payer: Self-pay

## 2019-03-18 DIAGNOSIS — E119 Type 2 diabetes mellitus without complications: Secondary | ICD-10-CM | POA: Insufficient documentation

## 2019-03-18 DIAGNOSIS — W19XXXA Unspecified fall, initial encounter: Secondary | ICD-10-CM | POA: Insufficient documentation

## 2019-03-18 DIAGNOSIS — I10 Essential (primary) hypertension: Secondary | ICD-10-CM | POA: Diagnosis not present

## 2019-03-18 DIAGNOSIS — S299XXA Unspecified injury of thorax, initial encounter: Secondary | ICD-10-CM | POA: Diagnosis not present

## 2019-03-18 DIAGNOSIS — R0781 Pleurodynia: Secondary | ICD-10-CM | POA: Insufficient documentation

## 2019-03-18 NOTE — Telephone Encounter (Signed)
Patient aware of recommendations. Patient would like to proceed with rib xray. Order placed for [facility].

## 2019-03-18 NOTE — Telephone Encounter (Signed)
In that note that was mentioned, no rib issues were described.  If an order needs to be placed for evaluation of those ribs that is okay.  I am not sure which side it is.  And again this needs to have just careful activity, continue medications Dr. Lajuana Ripple had given.  And if something suddenly changes or where she has difficulty with breathing to go straight to the emergency room, or even call 911

## 2019-03-18 NOTE — Telephone Encounter (Signed)
On 7/22 the patient was seen by Dr. Lajuana Ripple.  She had had a fall that morning.  X-ray was performed that day and did have no fractures in the hip.  On the physical exam the hematoma that was grapefruit size was noticed in the examination.  And I am assuming this is where she is the most tender.  Hematoma just takes time to go away.  If she can ice it throughout the day that can help, and of course do not sit on that side.  In most cases they do not do surgery to open up and evacuate a hematoma.  However there is some sudden change in what this looks like her feels like she needs to go to the emergency room so other testing can be performed.

## 2019-03-18 NOTE — Telephone Encounter (Signed)
Patient states that she can not lay on her hip and it is bruised she states it is black. She states that she can hardly sit down. Patient states her ribs are sore also.

## 2019-03-21 ENCOUNTER — Encounter: Payer: Self-pay | Admitting: Physician Assistant

## 2019-03-21 ENCOUNTER — Other Ambulatory Visit: Payer: Self-pay

## 2019-03-21 ENCOUNTER — Ambulatory Visit (INDEPENDENT_AMBULATORY_CARE_PROVIDER_SITE_OTHER): Payer: Medicare Other | Admitting: Physician Assistant

## 2019-03-21 VITALS — BP 158/94 | HR 99 | Temp 98.2°F | Ht 60.0 in | Wt 153.0 lb

## 2019-03-21 DIAGNOSIS — Z8719 Personal history of other diseases of the digestive system: Secondary | ICD-10-CM

## 2019-03-21 DIAGNOSIS — M25561 Pain in right knee: Secondary | ICD-10-CM | POA: Diagnosis not present

## 2019-03-21 DIAGNOSIS — K2100 Gastro-esophageal reflux disease with esophagitis, without bleeding: Secondary | ICD-10-CM

## 2019-03-21 MED ORDER — PREDNISONE 10 MG (21) PO TBPK: ORAL_TABLET | ORAL | 0 refills | Status: DC

## 2019-03-23 ENCOUNTER — Other Ambulatory Visit: Payer: Self-pay | Admitting: *Deleted

## 2019-03-23 ENCOUNTER — Telehealth: Payer: Self-pay | Admitting: Family Medicine

## 2019-03-23 DIAGNOSIS — R7989 Other specified abnormal findings of blood chemistry: Secondary | ICD-10-CM

## 2019-03-23 NOTE — Telephone Encounter (Signed)
Patient states that her BS last night was 500 after taking her prednisone yesterday. Patient also states she did not sleep at all last night. Patient had not checked bs yet this morning so I asked patient to check while we were on the phone. Patients BS was 298.

## 2019-03-23 NOTE — Telephone Encounter (Signed)
Please have her take only 2 tablets each morning until they are gone.  And if the sugars are still up reduce that to 1 tablet each morning

## 2019-03-23 NOTE — Telephone Encounter (Signed)
Patient aware of recommendations and verbalizes understanding.  

## 2019-03-24 ENCOUNTER — Other Ambulatory Visit: Payer: Self-pay

## 2019-03-24 ENCOUNTER — Other Ambulatory Visit: Payer: Medicare Other

## 2019-03-24 DIAGNOSIS — R7989 Other specified abnormal findings of blood chemistry: Secondary | ICD-10-CM | POA: Diagnosis not present

## 2019-03-24 NOTE — Progress Notes (Signed)
BP (!) 158/94   Pulse 99   Temp 98.2 F (36.8 C) (Oral)   Ht 5' (1.524 m)   Wt 153 lb (69.4 kg)   BMI 29.88 kg/m    Subjective:    Patient ID: Debra Phillips, female    DOB: 1929-03-29, 85 y.o.   MRN: [id number]  HPI: Debra Phillips is a 85 y.o. female presenting on 03/21/2019 for Fall (arm, hand, shoulder pain, hip )  This patient recently had a fall and fell on her knee.  She has had some swelling.  And it hurts to move and stand.  It will get stiff when she sits still.  She has not really tried any medication for it.  We will try a small amount of prednisone to see if we can calm this down.  She has stomach issues and cannot really take NSAIDs.  She states that her stomach has been even worse.  And she does have a history of esophageal stricture that had to be stretched.  She would like to go back to the gastroenterologist.  Referral will be placed.  Past Medical History:  Diagnosis Date  . Abnormality of gait 01/29/2016  . Aortic root dilatation (HCC)    Mild  . Cataract   . Diabetes mellitus without complication ([place])    type 2  . Dyslipidemia   . Dyspnea    with exertion  . Dysrhythmia    hx of occ palpitations  . Esophageal stricture    s/p dilation  . GERD (gastroesophageal reflux disease)   . Headache   . History of hiatal hernia   . Hyperlipidemia   . Hypertension   . Macular pucker, left eye   . Osteoarthritis   . Osteoporosis   . Tinnitus    left ear last week or so   Relevant past medical, surgical, family and social history reviewed and updated as indicated. Interim medical history since our last visit reviewed. Allergies and medications reviewed and updated. DATA REVIEWED: CHART IN EPIC  Family History reviewed for pertinent findings.  Review of Systems  Constitutional: Negative.   HENT: Negative.   Eyes: Negative.   Respiratory: Negative.   Gastrointestinal: Positive for abdominal distention and abdominal pain. Negative for nausea and  vomiting.  Genitourinary: Negative.   Musculoskeletal: Positive for arthralgias.    Allergies as of 03/21/2019      Reactions   Codeine Nausea And Vomiting   Doxycycline Hives   Other Nausea Only   Almost all antibiotics   Sulfonamide Derivatives Hives   Vancomycin Itching      Medication List       Accurate as of March 21, 2019 11:59 PM. If you have any questions, ask your nurse or doctor.        aspirin EC 81 MG tablet Take 81 mg by mouth daily.   Calcium Carb-Cholecalciferol 600-500 MG-UNIT Caps Take 1 capsule by mouth every morning.   Cholecalciferol 25 MCG (1000 UT) tablet Take 1,000 Units by mouth daily.   diltiazem 180 MG 24 hr capsule Commonly known as: CARDIZEM CD TAKE (1) CAPSULE DAILY   ESTRACE VAGINAL 0.1 MG/GM vaginal cream Generic drug: estradiol Place 1 Applicatorful vaginally once a week.   hydroxypropyl methylcellulose / hypromellose 2.5 % ophthalmic solution Commonly known as: ISOPTO TEARS / GONIOVISC Place 1 drop into both eyes 3 (three) times daily as needed for dry eyes.   lisinopril 40 MG tablet Commonly known as: ZESTRIL Take 1 tablet (40 mg  total) by mouth daily.   metFORMIN 1000 MG tablet Commonly known as: GLUCOPHAGE Take 1 tablet (1,000 mg total) by mouth 2 (two) times daily with a meal.   Myrbetriq 25 MG Tb24 tablet Generic drug: mirabegron ER Take 1 tablet (25 mg total) by mouth daily.   nitroGLYCERIN 0.4 MG SL tablet Commonly known as: NITROSTAT Place 1 tablet (0.4 mg total) under the tongue every 5 (five) minutes as needed for chest pain.   omeprazole 40 MG capsule Commonly known as: PRILOSEC Take 1 capsule (40 mg total) by mouth 2 (two) times daily.   ONE TOUCH ULTRA TEST test strip Generic drug: glucose blood CHECK BLOOD SUGAR ONCE A DAY   onetouch ultrasoft lancets CHECK BLOOD SUGAR ONCE A DAY   pravastatin 40 MG tablet Commonly known as: PRAVACHOL Take 1 tablet (40 mg total) by mouth daily.   predniSONE 10 MG  (21) Tbpk tablet Commonly known as: STERAPRED UNI-PAK 21 TAB As directed x 6 days Started by: Terald Sleeper, PA-C          Objective:    BP (!) 158/94   Pulse 99   Temp 98.2 F (36.8 C) (Oral)   Ht 5' (1.524 m)   Wt 153 lb (69.4 kg)   BMI 29.88 kg/m   Allergies  Allergen Reactions  . Codeine Nausea And Vomiting  . Doxycycline Hives  . Other Nausea Only    Almost all antibiotics  . Sulfonamide Derivatives Hives  . Vancomycin Itching    Wt Readings from Last 3 Encounters:  03/21/19 153 lb (69.4 kg)  03/16/19 153 lb (69.4 kg)  03/11/19 153 lb (69.4 kg)    Physical Exam Constitutional:      Appearance: She is well-developed.  HENT:     Head: Normocephalic and atraumatic.  Eyes:     Conjunctiva/sclera: Conjunctivae normal.     Pupils: Pupils are equal, round, and reactive to light.  Cardiovascular:     Rate and Rhythm: Normal rate and regular rhythm.     Heart sounds: Normal heart sounds.  Pulmonary:     Effort: Pulmonary effort is normal.     Breath sounds: Normal breath sounds.  Abdominal:     General: Bowel sounds are normal.     Palpations: Abdomen is soft.  Skin:    General: Skin is warm and dry.     Findings: No rash.  Neurological:     Mental Status: She is alert and oriented to person, place, and time.     Deep Tendon Reflexes: Reflexes are normal and symmetric.  Psychiatric:        Behavior: Behavior normal.        Thought Content: Thought content normal.        Judgment: Judgment normal.     Results for orders placed or performed in visit on 03/11/19  Bayer DCA Hb A1c Waived  Result Value Ref Range   HB A1C (BAYER DCA - WAIVED) 7.5 (H) <8.0 %  Basic Metabolic Panel  Result Value Ref Range   Glucose 135 (H) 65 - 99 mg/dL   BUN 15 8 - 27 mg/dL   Creatinine, Ser 0.73 0.57 - 1.00 mg/dL   GFR calc non Af Amer 74 >59 mL/min/1.73   GFR calc Af Amer 85 >59 mL/min/1.73   BUN/Creatinine Ratio 21 12 - 28   Sodium 130 (L) 134 - 144 mmol/L    Potassium 4.6 3.5 - 5.2 mmol/L   Chloride 92 (L) 96 - 106  mmol/L   CO2 24 20 - 29 mmol/L   Calcium 9.5 8.7 - 10.3 mg/dL      Assessment & Plan:   1. Gastroesophageal reflux disease with esophagitis - Ambulatory referral to Gastroenterology  2. History of esophageal stricture - Ambulatory referral to Gastroenterology  3. Acute pain of right knee - predniSONE (STERAPRED UNI-PAK 21 TAB) 10 MG (21) TBPK tablet; As directed x 6 days  Dispense: 21 tablet; Refill: 0   Continue all other maintenance medications as listed above.  Follow up plan: No follow-ups on file.  Educational handout given for knee pain  Terald Sleeper PA-C [place] [address]  [place], [place] [address] [phone]   03/24/2019, 12:40 PM

## 2019-03-25 ENCOUNTER — Telehealth: Payer: Self-pay | Admitting: Family Medicine

## 2019-03-25 LAB — CMP14+EGFR
ALT: 21 IU/L (ref 0–32)
AST: 21 IU/L (ref 0–40)
Albumin/Globulin Ratio: 1.6 (ref 1.2–2.2)
Albumin: 3.8 g/dL (ref 3.6–4.6)
Alkaline Phosphatase: 51 IU/L (ref 39–117)
BUN/Creatinine Ratio: 24 (ref 12–28)
BUN: 16 mg/dL (ref 8–27)
Bilirubin Total: 0.3 mg/dL (ref 0.0–1.2)
CO2: 23 mmol/L (ref 20–29)
Calcium: 9.4 mg/dL (ref 8.7–10.3)
Chloride: 92 mmol/L — ABNORMAL LOW (ref 96–106)
Creatinine, Ser: 0.68 mg/dL (ref 0.57–1.00)
GFR calc Af Amer: 90 mL/min/{1.73_m2} (ref >59)
GFR calc non Af Amer: 78 mL/min/{1.73_m2} (ref >59)
Globulin, Total: 2.4 g/dL (ref 1.5–4.5)
Glucose: 151 mg/dL — ABNORMAL HIGH (ref 65–99)
Potassium: 4.5 mmol/L (ref 3.5–5.2)
Sodium: 133 mmol/L — ABNORMAL LOW (ref 134–144)
Total Protein: 6.2 g/dL (ref 6.0–8.5)

## 2019-03-25 NOTE — Telephone Encounter (Signed)
forwarding

## 2019-03-28 NOTE — Telephone Encounter (Signed)
Reviewed and sent to pools

## 2019-03-29 DIAGNOSIS — R030 Elevated blood-pressure reading, without diagnosis of hypertension: Secondary | ICD-10-CM | POA: Diagnosis not present

## 2019-03-29 DIAGNOSIS — M47812 Spondylosis without myelopathy or radiculopathy, cervical region: Secondary | ICD-10-CM | POA: Diagnosis not present

## 2019-03-31 ENCOUNTER — Ambulatory Visit (INDEPENDENT_AMBULATORY_CARE_PROVIDER_SITE_OTHER): Payer: Medicare Other | Admitting: Family Medicine

## 2019-03-31 ENCOUNTER — Encounter: Payer: Self-pay | Admitting: Family Medicine

## 2019-03-31 ENCOUNTER — Ambulatory Visit: Payer: Medicare Other | Admitting: Family Medicine

## 2019-03-31 ENCOUNTER — Other Ambulatory Visit: Payer: Self-pay

## 2019-03-31 DIAGNOSIS — R630 Anorexia: Secondary | ICD-10-CM

## 2019-03-31 MED ORDER — MIRTAZAPINE 7.5 MG PO TABS: 7.5000 mg | ORAL_TABLET | Freq: Every day | ORAL | 2 refills | Status: DC

## 2019-03-31 NOTE — Progress Notes (Signed)
Virtual Visit via Telephone Note  I connected with Debra Phillips on 03/31/19 at 9:23 AM by telephone and verified that I am speaking with the correct person using two identifiers. Debra Phillips is currently located at home and nobody is currently with her during this visit. The provider, Loman Brooklyn, FNP is located in their home at time of visit.  I discussed the limitations, risks, security and privacy concerns of performing an evaluation and management service by telephone and the availability of in person appointments. I also discussed with the patient that there may be a patient responsible charge related to this service. The patient expressed understanding and agreed to proceed.  Subjective: PCP: Debra Norlander, DO  Chief Complaint  Patient presents with  . Anorexia   Patient reports she doesn't feel like eating. At first she states it is because she might get sick if she does, then states she doesn't like food anymore, and finally tells me that she just doesn't have an appetite. She does eat breakfast but then may only eat a cup of yogurt later. She reports this has been going on for a long time. She does have a history of an esophageal stricture but states that is not why she is not eating. She denies any difficulty swallowing.    ROS: Per HPI  Current Outpatient Medications:  .  aspirin EC 81 MG tablet, Take 81 mg by mouth daily., Disp: , Rfl:  .  Calcium Carb-Cholecalciferol 600-500 MG-UNIT CAPS, Take 1 capsule by mouth every morning., Disp: , Rfl:  .  Cholecalciferol 1000 UNITS tablet, Take 1,000 Units by mouth daily. , Disp: , Rfl:  .  diltiazem (CARDIZEM CD) 180 MG 24 hr capsule, TAKE (1) CAPSULE DAILY, Disp: 90 capsule, Rfl: 0 .  ESTRACE VAGINAL 0.1 MG/GM vaginal cream, Place 1 Applicatorful vaginally once a week., Disp: 42.5 g, Rfl: 1 .  hydroxypropyl methylcellulose / hypromellose (ISOPTO TEARS / GONIOVISC) 2.5 % ophthalmic solution, Place 1 drop into both  eyes 3 (three) times daily as needed for dry eyes., Disp: , Rfl:  .  Lancets (ONETOUCH ULTRASOFT) lancets, CHECK BLOOD SUGAR ONCE A DAY, Disp: 50 each, Rfl: 9 .  lisinopril (ZESTRIL) 40 MG tablet, Take 1 tablet (40 mg total) by mouth daily., Disp: 30 tablet, Rfl: 0 .  metFORMIN (GLUCOPHAGE) 1000 MG tablet, Take 1 tablet (1,000 mg total) by mouth 2 (two) times daily with a meal., Disp: 180 tablet, Rfl: 3 .  mirtazapine (REMERON) 7.5 MG tablet, Take 1 tablet (7.5 mg total) by mouth at bedtime., Disp: 30 tablet, Rfl: 2 .  MYRBETRIQ 25 MG TB24 tablet, Take 1 tablet (25 mg total) by mouth daily., Disp: 30 tablet, Rfl: 0 .  nitroGLYCERIN (NITROSTAT) 0.4 MG SL tablet, Place 1 tablet (0.4 mg total) under the tongue every 5 (five) minutes as needed for chest pain., Disp: 25 tablet, Rfl: 3 .  omeprazole (PRILOSEC) 40 MG capsule, Take 1 capsule (40 mg total) by mouth 2 (two) times daily., Disp: 180 capsule, Rfl: 1 .  ONE TOUCH ULTRA TEST test strip, CHECK BLOOD SUGAR ONCE A DAY, Disp: 50 each, Rfl: 11 .  pravastatin (PRAVACHOL) 40 MG tablet, Take 1 tablet (40 mg total) by mouth daily., Disp: 30 tablet, Rfl: 0  Allergies  Allergen Reactions  . Codeine Nausea And Vomiting  . Doxycycline Hives  . Other Nausea Only    Almost all antibiotics  . Sulfonamide Derivatives Hives  . Vancomycin Itching  Past Medical History:  Diagnosis Date  . Abnormality of gait 01/29/2016  . Aortic root dilatation (HCC)    Mild  . Cataract   . Diabetes mellitus without complication ([place])    type 2  . Dyslipidemia   . Dyspnea    with exertion  . Dysrhythmia    hx of occ palpitations  . Esophageal stricture    s/p dilation  . GERD (gastroesophageal reflux disease)   . Headache   . History of hiatal hernia   . Hyperlipidemia   . Hypertension   . Macular pucker, left eye   . Osteoarthritis   . Osteoporosis   . Tinnitus    left ear last week or so    Observations/Objective: A&O  No respiratory distress or  wheezing audible over the phone Mood, judgement, and thought processes all WNL  Assessment and Plan: 1. Poor appetite - Discussed possibly drinking Ensure or Boost if she is going to skip an entire meal. Rx'd Remeron to help with appetite.  - mirtazapine (REMERON) 7.5 MG tablet; Take 1 tablet (7.5 mg total) by mouth at bedtime.  Dispense: 30 tablet; Refill: 2  Follow Up Instructions:  I discussed the assessment and treatment plan with the patient. The patient was provided an opportunity to ask questions and all were answered. The patient agreed with the plan and demonstrated an understanding of the instructions.   The patient was advised to call back or seek an in-person evaluation if the symptoms worsen or if the condition fails to improve as anticipated.  The above assessment and management plan was discussed with the patient. The patient verbalized understanding of and has agreed to the management plan. Patient is aware to call the clinic if symptoms persist or worsen. Patient is aware when to return to the clinic for a follow-up visit. Patient educated on when it is appropriate to go to the emergency department.   Time call ended: 9:37 AM  I provided 14 minutes of non-face-to-face time during this encounter.  Hendricks Limes, MSN, APRN, FNP-C [facility] 03/31/19

## 2019-04-01 ENCOUNTER — Other Ambulatory Visit: Payer: Self-pay | Admitting: *Deleted

## 2019-04-01 ENCOUNTER — Ambulatory Visit (INDEPENDENT_AMBULATORY_CARE_PROVIDER_SITE_OTHER): Payer: Medicare Other | Admitting: Family Medicine

## 2019-04-01 ENCOUNTER — Other Ambulatory Visit: Payer: Self-pay

## 2019-04-01 ENCOUNTER — Encounter: Payer: Self-pay | Admitting: Family Medicine

## 2019-04-01 ENCOUNTER — Other Ambulatory Visit: Payer: Self-pay | Admitting: Family Medicine

## 2019-04-01 ENCOUNTER — Ambulatory Visit (HOSPITAL_COMMUNITY)
Admission: RE | Admit: 2019-04-01 | Discharge: 2019-04-01 | Disposition: A | Discharge status: Home / Self Care | Payer: Medicare Other | Source: Ambulatory Visit | Attending: Family Medicine | Admitting: Family Medicine

## 2019-04-01 VITALS — BP 128/81 | HR 99 | Temp 97.9°F | Ht 60.0 in | Wt 152.0 lb

## 2019-04-01 DIAGNOSIS — M7989 Other specified soft tissue disorders: Secondary | ICD-10-CM

## 2019-04-01 DIAGNOSIS — M79662 Pain in left lower leg: Secondary | ICD-10-CM | POA: Insufficient documentation

## 2019-04-01 DIAGNOSIS — R600 Localized edema: Secondary | ICD-10-CM | POA: Diagnosis not present

## 2019-04-01 DIAGNOSIS — L03116 Cellulitis of left lower limb: Secondary | ICD-10-CM

## 2019-04-01 MED ORDER — FLUCONAZOLE 150 MG PO TABS: 150.0000 mg | ORAL_TABLET | Freq: Once | ORAL | 0 refills | Status: AC

## 2019-04-01 MED ORDER — PRAVASTATIN SODIUM 40 MG PO TABS: 40.0000 mg | ORAL_TABLET | Freq: Every day | ORAL | 0 refills | Status: DC

## 2019-04-01 MED ORDER — CEPHALEXIN 250 MG PO CAPS: 250.0000 mg | ORAL_CAPSULE | Freq: Four times a day (QID) | ORAL | 0 refills | Status: AC

## 2019-04-01 MED ORDER — LISINOPRIL 40 MG PO TABS: 40.0000 mg | ORAL_TABLET | Freq: Every day | ORAL | 0 refills | Status: DC

## 2019-04-01 NOTE — Progress Notes (Signed)
Subjective: CC: leg swelling PCP: [name], DO TDD:UKGURK M Lehr is a 85 y.o. female presenting to clinic today for:  1. Leg swelling Patient reports abrupt onset of left lower leg swelling, redness and pain.  She reports pain with ambulation.  She sustained a fall on the left side about a week and a half ago.  There were no fractures visualized during that visit to the ED.  Denies any recent scratch or injury to left lower leg.   ROS: Per HPI  Allergies  Allergen Reactions  . Codeine Nausea And Vomiting  . Doxycycline Hives  . Other Nausea Only    Almost all antibiotics  . Sulfonamide Derivatives Hives  . Vancomycin Itching   Past Medical History:  Diagnosis Date  . Abnormality of gait 01/29/2016  . Aortic root dilatation (HCC)    Mild  . Cataract   . Diabetes mellitus without complication ([place])    type 2  . Dyslipidemia   . Dyspnea    with exertion  . Dysrhythmia    hx of occ palpitations  . Esophageal stricture    s/p dilation  . GERD (gastroesophageal reflux disease)   . Headache   . History of hiatal hernia   . Hyperlipidemia   . Hypertension   . Macular pucker, left eye   . Osteoarthritis   . Osteoporosis   . Tinnitus    left ear last week or so    Current Outpatient Medications:  .  aspirin EC 81 MG tablet, Take 81 mg by mouth daily., Disp: , Rfl:  .  Calcium Carb-Cholecalciferol 600-500 MG-UNIT CAPS, Take 1 capsule by mouth every morning., Disp: , Rfl:  .  Cholecalciferol 1000 UNITS tablet, Take 1,000 Units by mouth daily. , Disp: , Rfl:  .  diltiazem (CARDIZEM CD) 180 MG 24 hr capsule, TAKE (1) CAPSULE DAILY, Disp: 90 capsule, Rfl: 0 .  ESTRACE VAGINAL 0.1 MG/GM vaginal cream, Place 1 Applicatorful vaginally once a week., Disp: 42.5 g, Rfl: 1 .  hydroxypropyl methylcellulose / hypromellose (ISOPTO TEARS / GONIOVISC) 2.5 % ophthalmic solution, Place 1 drop into both eyes 3 (three) times daily as needed for dry eyes., Disp: , Rfl:  .   Lancets (ONETOUCH ULTRASOFT) lancets, CHECK BLOOD SUGAR ONCE A DAY, Disp: 50 each, Rfl: 9 .  lisinopril (ZESTRIL) 40 MG tablet, Take 1 tablet (40 mg total) by mouth daily., Disp: 90 tablet, Rfl: 0 .  metFORMIN (GLUCOPHAGE) 1000 MG tablet, Take 1 tablet (1,000 mg total) by mouth 2 (two) times daily with a meal., Disp: 180 tablet, Rfl: 3 .  mirtazapine (REMERON) 7.5 MG tablet, Take 1 tablet (7.5 mg total) by mouth at bedtime., Disp: 30 tablet, Rfl: 2 .  MYRBETRIQ 25 MG TB24 tablet, Take 1 tablet (25 mg total) by mouth daily., Disp: 30 tablet, Rfl: 0 .  nitroGLYCERIN (NITROSTAT) 0.4 MG SL tablet, Place 1 tablet (0.4 mg total) under the tongue every 5 (five) minutes as needed for chest pain., Disp: 25 tablet, Rfl: 3 .  omeprazole (PRILOSEC) 40 MG capsule, Take 1 capsule (40 mg total) by mouth 2 (two) times daily., Disp: 180 capsule, Rfl: 1 .  ONE TOUCH ULTRA TEST test strip, CHECK BLOOD SUGAR ONCE A DAY, Disp: 50 each, Rfl: 11 .  pravastatin (PRAVACHOL) 40 MG tablet, Take 1 tablet (40 mg total) by mouth daily., Disp: 90 tablet, Rfl: 0 Social History   Socioeconomic History  . Marital status: Widowed    Spouse name: Not  on file  . Number of children: 5  . Years of education: 5  . Highest education level: Not on file  Occupational History  . 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 Financial resource strain: Not very hard  . Food insecurity    Worry: Never true    Inability: Never true  . Transportation needs    Medical: No    Non-medical: No  Tobacco Use  . Smoking status: Never Smoker  . Smokeless tobacco: Never Used  Substance and Sexual Activity  . Alcohol use: No  . Drug use: No  . Sexual activity: Not Currently  Lifestyle  . Physical activity    Days per week: 3 days    Minutes per session: 10 min  . Stress: Only a little  Relationships  . Social connections    Talks on phone: More than three times a week    Gets together:  More than three times a week    Attends religious service: More than 4 times per year    Active member of club or organization: Yes    Attends meetings of clubs or organizations: More than 4 times per year    Relationship status: Widowed  . Intimate partner violence    Fear of current or ex partner: No    Emotionally abused: No    Physically abused: No    Forced sexual activity: No  Other Topics Concern  . Not on file  Social History Narrative   Pt lives at home alone   Husband w/ Alzheimer's passed away   Right-handed   Drinks 1-2 cups of coffee daily   Family History  Problem Relation Age of Onset  . Pneumonia Mother   . Brain cancer Father   . Tremor Father   . Arthritis Sister   . COPD Sister   . Emphysema Brother   . COPD Brother   . Tremor Brother   . Lung cancer Brother   . Heart disease Brother   . Liver cancer Brother   . Diabetes Other         1 child has diabetes at age 8 and the other 4 are healthy    Objective: Office vital signs reviewed. BP 128/81   Pulse 99   Temp 97.9 F (36.6 C) (Oral)   Ht 5' (1.524 m)   Wt 152 lb (68.9 kg)   BMI 29.69 kg/m   Physical Examination:  General: Awake, alert, well nourished, No acute distress HEENT: Normal Extremities: warm, well perfused, 1+ pitting edema to mid shin on LLE. No cyanosis or clubbing; +2 pulses bilaterally; she has tenderness palpation to the deep venous system on the left.  Positive Homans sign.  She has associated erythema and increased warmth of the left lower extremity.  Assessment/ Plan: 85 y.o. female   1. Pain and swelling of left lower leg Concern for possible acute DVT given constellation of symptoms and limited mobility.  Her Wells score is 2.  Proceed with venous ultrasound given inability to perform stat d-dimer.  This was ordered urgently and patient was directed straight to the local hospital to have this obtained.  She is to wait in the facility until the reading has been completed  so that we can determine whether or not she needs to be started on anticoagulation versus antibiotic for cellulitis versus sent to the emergency department for complications. - US Venous Img Lower Unilateral Left;  Future   No orders of the defined types were placed in this encounter.  No orders of the defined types were placed in this encounter.    [name], DO [place] [phone]

## 2019-04-01 NOTE — Progress Notes (Signed)
Spoke with daughter, Mariann Laster, on phone. Ok to go home. No DVT.  I am sending in Keflex to cover for presumed cellulitis. Diflucan also sent.  Keep leg elevated.  Follow up if no significant improvement by next week.

## 2019-04-01 NOTE — Patient Instructions (Signed)
I have ordered a stat ultrasound of your leg to rule out blood clot. Please wait at the hospital for me to be contacted with this result so that we can make a plan.

## 2019-04-04 ENCOUNTER — Other Ambulatory Visit: Payer: Self-pay | Admitting: Family Medicine

## 2019-04-04 ENCOUNTER — Telehealth: Payer: Self-pay | Admitting: Family Medicine

## 2019-04-04 DIAGNOSIS — R300 Dysuria: Secondary | ICD-10-CM

## 2019-04-04 DIAGNOSIS — L03116 Cellulitis of left lower limb: Secondary | ICD-10-CM

## 2019-04-04 MED ORDER — CLINDAMYCIN HCL 300 MG PO CAPS: 300.0000 mg | ORAL_CAPSULE | Freq: Four times a day (QID) | ORAL | 0 refills | Status: AC

## 2019-04-04 NOTE — Telephone Encounter (Signed)
Daughter states that patient's leg is not getting any better with Antibiotics.  Keflex was given by her urologist as a maintenance dose for UTIs.  Patient still has heat, redness, and stinging to leg.  Daughter is wondering if patient will need a different antibiotic since she has been on keflex for maintenance utis.

## 2019-04-04 NOTE — Telephone Encounter (Signed)
I have added clindamycin, as she is allergic to pretty much everything else it is available for skin.  Please inform her that with clindamycin she does have increased risk of diarrheal illness, including C. difficile.  Please monitor closely for signs and symptoms of severe diarrhea.  If no significant improvement or worsening of skin infection would recommend evaluation emergency department.

## 2019-04-04 NOTE — Telephone Encounter (Signed)
Daughter aware.

## 2019-04-07 ENCOUNTER — Telehealth: Payer: Self-pay | Admitting: Family Medicine

## 2019-04-07 NOTE — Telephone Encounter (Signed)
Patient seen Dr. Darnell Level 8/7- please advise and send back to pools.

## 2019-04-08 NOTE — Telephone Encounter (Signed)
As we discussed previously, if no improvement after 48 hours or worsening, go to ED.

## 2019-04-08 NOTE — Telephone Encounter (Signed)
Debra Phillips states that cellulitis is better.

## 2019-04-11 ENCOUNTER — Emergency Department (HOSPITAL_COMMUNITY)
Admission: EM | Admit: 2019-04-11 | Discharge: 2019-04-11 | Disposition: A | Discharge status: Home / Self Care | Payer: Medicare Other | Attending: Emergency Medicine | Admitting: Emergency Medicine

## 2019-04-11 ENCOUNTER — Emergency Department (HOSPITAL_COMMUNITY): Payer: Medicare Other

## 2019-04-11 ENCOUNTER — Other Ambulatory Visit: Payer: Self-pay

## 2019-04-11 ENCOUNTER — Telehealth: Payer: Self-pay | Admitting: Family Medicine

## 2019-04-11 ENCOUNTER — Encounter (HOSPITAL_COMMUNITY): Payer: Self-pay | Admitting: Emergency Medicine

## 2019-04-11 DIAGNOSIS — S79912A Unspecified injury of left hip, initial encounter: Secondary | ICD-10-CM | POA: Diagnosis not present

## 2019-04-11 DIAGNOSIS — Y999 Unspecified external cause status: Secondary | ICD-10-CM | POA: Insufficient documentation

## 2019-04-11 DIAGNOSIS — E119 Type 2 diabetes mellitus without complications: Secondary | ICD-10-CM | POA: Diagnosis not present

## 2019-04-11 DIAGNOSIS — M7989 Other specified soft tissue disorders: Secondary | ICD-10-CM

## 2019-04-11 DIAGNOSIS — Z96652 Presence of left artificial knee joint: Secondary | ICD-10-CM | POA: Diagnosis not present

## 2019-04-11 DIAGNOSIS — Y939 Activity, unspecified: Secondary | ICD-10-CM | POA: Diagnosis not present

## 2019-04-11 DIAGNOSIS — D649 Anemia, unspecified: Secondary | ICD-10-CM

## 2019-04-11 DIAGNOSIS — R0602 Shortness of breath: Secondary | ICD-10-CM | POA: Diagnosis not present

## 2019-04-11 DIAGNOSIS — S7002XA Contusion of left hip, initial encounter: Secondary | ICD-10-CM | POA: Insufficient documentation

## 2019-04-11 DIAGNOSIS — R296 Repeated falls: Secondary | ICD-10-CM | POA: Insufficient documentation

## 2019-04-11 DIAGNOSIS — W19XXXA Unspecified fall, initial encounter: Secondary | ICD-10-CM | POA: Diagnosis not present

## 2019-04-11 DIAGNOSIS — Y929 Unspecified place or not applicable: Secondary | ICD-10-CM | POA: Insufficient documentation

## 2019-04-11 DIAGNOSIS — Z79899 Other long term (current) drug therapy: Secondary | ICD-10-CM | POA: Diagnosis not present

## 2019-04-11 DIAGNOSIS — S0990XA Unspecified injury of head, initial encounter: Secondary | ICD-10-CM | POA: Insufficient documentation

## 2019-04-11 DIAGNOSIS — M25552 Pain in left hip: Secondary | ICD-10-CM | POA: Diagnosis not present

## 2019-04-11 DIAGNOSIS — E871 Hypo-osmolality and hyponatremia: Secondary | ICD-10-CM | POA: Diagnosis not present

## 2019-04-11 DIAGNOSIS — I10 Essential (primary) hypertension: Secondary | ICD-10-CM | POA: Insufficient documentation

## 2019-04-11 DIAGNOSIS — R600 Localized edema: Secondary | ICD-10-CM | POA: Diagnosis not present

## 2019-04-11 DIAGNOSIS — R590 Localized enlarged lymph nodes: Secondary | ICD-10-CM | POA: Diagnosis not present

## 2019-04-11 LAB — CBC WITH DIFFERENTIAL/PLATELET
Abs Immature Granulocytes: 0.06 10*3/uL (ref 0.00–0.07)
Basophils Absolute: 0.1 10*3/uL (ref 0.0–0.1)
Basophils Relative: 1 %
Eosinophils Absolute: 0.1 10*3/uL (ref 0.0–0.5)
Eosinophils Relative: 1 %
HCT: 34 % — ABNORMAL LOW (ref 36.0–46.0)
Hemoglobin: 11 g/dL — ABNORMAL LOW (ref 12.0–15.0)
Immature Granulocytes: 1 %
Lymphocytes Relative: 26 %
Lymphs Abs: 2.2 10*3/uL (ref 0.7–4.0)
MCH: 29.6 pg (ref 26.0–34.0)
MCHC: 32.4 g/dL (ref 30.0–36.0)
MCV: 91.4 fL (ref 80.0–100.0)
Monocytes Absolute: 0.7 10*3/uL (ref 0.1–1.0)
Monocytes Relative: 9 %
Neutro Abs: 5.4 10*3/uL (ref 1.7–7.7)
Neutrophils Relative %: 62 %
Platelets: 336 10*3/uL (ref 150–400)
RBC: 3.72 MIL/uL — ABNORMAL LOW (ref 3.87–5.11)
RDW: 14.5 % (ref 11.5–15.5)
WBC: 8.6 10*3/uL (ref 4.0–10.5)
nRBC: 0.3 % — ABNORMAL HIGH (ref 0.0–0.2)

## 2019-04-11 LAB — COMPREHENSIVE METABOLIC PANEL
ALT: 19 U/L (ref 0–44)
AST: 21 U/L (ref 15–41)
Albumin: 3.2 g/dL — ABNORMAL LOW (ref 3.5–5.0)
Alkaline Phosphatase: 45 U/L (ref 38–126)
Anion gap: 11 (ref 5–15)
BUN: 9 mg/dL (ref 8–23)
CO2: 28 mmol/L (ref 22–32)
Calcium: 9.1 mg/dL (ref 8.9–10.3)
Chloride: 91 mmol/L — ABNORMAL LOW (ref 98–111)
Creatinine, Ser: 0.67 mg/dL (ref 0.44–1.00)
GFR calc Af Amer: >60 mL/min (ref >60)
GFR calc non Af Amer: >60 mL/min (ref >60)
Glucose, Bld: 118 mg/dL — ABNORMAL HIGH (ref 70–99)
Potassium: 3.4 mmol/L — ABNORMAL LOW (ref 3.5–5.1)
Sodium: 130 mmol/L — ABNORMAL LOW (ref 135–145)
Total Bilirubin: 0.7 mg/dL (ref 0.3–1.2)
Total Protein: 5.8 g/dL — ABNORMAL LOW (ref 6.5–8.1)

## 2019-04-11 LAB — PROTIME-INR
INR: 1 (ref 0.8–1.2)
Prothrombin Time: 13.3 seconds (ref 11.4–15.2)

## 2019-04-11 LAB — LACTIC ACID, PLASMA: Lactic Acid, Venous: 3 mmol/L (ref 0.5–1.9)

## 2019-04-11 MED ORDER — IOHEXOL 350 MG/ML SOLN
100.0000 mL | Freq: Once | INTRAVENOUS | Status: AC | PRN
  Administered 2019-04-11: 100 mL via INTRAVENOUS

## 2019-04-11 NOTE — ED Triage Notes (Signed)
Pt states that she has cellulitis to her left lower leg. This has been going on for a 1 week or 2.

## 2019-04-11 NOTE — Telephone Encounter (Signed)
Debra Phillips aware to take [facility] to any [facility].

## 2019-04-11 NOTE — ED Provider Notes (Signed)
3:15 PM-checkout from Dr. Sabra Heck to evaluate after imaging returns, and consider disposition.  He ordered a CT angiogram chest to evaluate for PE, and CT abdomen pelvis to evaluate for possible occult venous compression leading to edema of the left leg.  The patient presented for evaluation of abnormal left leg, previously evaluated by her PCP, about 10 days ago, and was treated for infection.  Antibiotics elevated to clindamycin, 1 week ago.  Because of recent falls, with head injury and some trouble walking, she required a comprehensive evaluation to evaluate for injuries, and serious medical problems.  She presents for evaluation of concern for infection.  Clinical Course as of Apr 10 1733  Mon Apr 11, 2019  1345 I have personally looked at the CT scan of the brain as well as the x-rays of the left hip, my interpretation is that there is no bleeding of the brain or abnormalities of concern and the x-ray of the hip shows no signs of fractures according to my interpretation.  I agree with the radiologist.   [BM]  7062 US Venous Img Lower Unilateral Left [EW]  1730 Abnormal, elevated  Lactic acid, plasma(!!) [EW]  1730 Normal  Protime-INR [EW]  1730 Normal except hemoglobin low  CBC with Differential/Platelet(!) [EW]  1730 Normal except sodium low, potassium low, chloride low, glucose high, total protein low, abdomen low  Comprehensive metabolic panel(!) [EW]  3762 No acute intra-abdominal mass appreciated.  Nonspecific mass left greater trochanter region, consistent with hematoma versus abscess.  Images reviewed by me  CT ABDOMEN PELVIS W CONTRAST [EW]  8315 No PE or pneumonia.  No evidence for aortic dissection.  Images reviewed by me  CT Angio Chest PE W and/or Wo Contrast [EW]    Clinical Course User Index [BM] Noemi Chapel, MD [EW] Daleen Bo, MD    Patient Vitals for the past 24 hrs:  BP Temp Temp src Pulse Resp SpO2 Height Weight  04/11/19 1600 (!) 156/87 - - 89 20 95 % - -   04/11/19 1445 - - - - (!) 25 - - -  04/11/19 1430 - - - - (!) 26 - - -  04/11/19 1415 - - - 91 18 98 % - -  04/11/19 1400 - - - 91 20 95 % - -  04/11/19 1315 - - - - 19 - - -  04/11/19 1300 (!) 155/81 - - - - - - -  04/11/19 1245 (!) 148/71 - - 95 - 98 % - -  04/11/19 1236 (!) 150/96 97.7 F (36.5 C) Oral 99 18 99 % - -  04/11/19 1235 - - - - - - 5\' 5"  (1.651 m) 69.9 kg    6:27 PM Reevaluation with update and discussion. After initial assessment and treatment, an updated evaluation reveals she is comfortable hungry and wants to go home.  Left hip was evaluated by me and there is a visible hematoma there and is palpable.  There is no significant pain or tenderness at the site.  There is no overlying redness, drainage or bleeding.  Findings discussed with patient and daughter, all questions answered. Daleen Bo   Medical Decision Making: Left leg swelling, with redness and mild discomfort.  Doubt cellulitis.  Doubt DVT.  No evidence for abdominal/pelvic compression causing vein dilatation.  Additional evaluation for head injury, are reassuring.  Comprehensive metabolic and blood work testing are reassuring.  She has mild pre-existing hyponatremia and anemia.  The anemia could be related to the hematoma  of the left hip.  Patient is clinically stable, comfortable and there is no indication for hospitalization, at this time.  CRITICAL CARE-no Performed by: Daleen Bo    Nursing Notes Reviewed/ Care Coordinated Applicable Imaging Reviewed Interpretation of Laboratory Data incorporated into ED treatment  The patient appears reasonably screened and/or stabilized for discharge and I doubt any other medical condition or other [facility] requiring further screening, evaluation, or treatment in the ED at this time prior to discharge.  Plan: Home Medications- usual, APAP prn; Home Treatments- rest, compression stocking left leg, elevation; return here if the recommended treatment, does not improve the  symptoms; Recommended follow up- PCP 1 week       Daleen Bo, MD 04/12/19 1229

## 2019-04-11 NOTE — ED Notes (Signed)
Date and time results received: 04/11/19 1650 (use smartphrase ".now" to insert current time)  Test: lactic acid Critical Value: 3.0  Name of Provider Notified: Dr. Eulis Foster  Orders Received? Or Actions Taken?: n/a

## 2019-04-11 NOTE — Discharge Instructions (Addendum)
There were no serious problems found to require change in treatment of the left leg redness and swelling.  Since you have completed a course of clindamycin recently further antibiotic treatment at this time is not necessary.  Things that can help the swelling and redness include elevating the left leg above your heart as much as possible and using either wet or dry heat on it 3-4 times a day.  Compression stocking, up to the knee, can help the swelling of the left leg.  The CAT scan of the head did not indicate any serious injuries.  The x-rays and CAT scan, of the abdomen and left hip show only bruising around the left hip.  A test for blood clot in the left leg was negative.  You do have mild hyponatremia, which has been present previously.  Your hemoglobin is slightly low but may be related to the hematoma of the left hip.  It is important to get plenty of rest, drink a lot of fluids, and eat 3 good meals each day.  Please follow-up with your primary care doctor next week, for checkup.

## 2019-04-11 NOTE — ED Provider Notes (Signed)
[facility] EMERGENCY DEPARTMENT Provider Note   CSN: [id number] Arrival date & time: 04/11/19  1225     History   Chief Complaint Chief Complaint  Patient presents with  . Cellulitis    HPI Debra Phillips is a 85 y.o. female.     HPI  The patient is a pleasant 85 year old female with a known history of hyponatremia, recent history of multiple falls because of the hyponatremia and diabetes.  She presents to the hospital with left lower extremity swelling.  She was initially seen by her family doctor on August 7 at which time she was noted to have a swollen leg.  She was started on Keflex for presumed cellulitis however this did not significantly improve her symptoms.  She had a negative ultrasound at that time showing no DVT.  On 10 August she was started on clindamycin which she has taken for the last week.  She presents today because of ongoing swelling and pain to the left lower extremity.  She does report having recent falls landing on her hip, she also hit her head but does not have a headache.  She still feels like she is a little bit off balance but walking with her cane.  She denies fevers or chills, denies shortness of breath or chest pain at this time though she states she does have it occasionally.  Past Medical History:  Diagnosis Date  . Abnormality of gait 01/29/2016  . Aortic root dilatation (HCC)    Mild  . Cataract   . Diabetes mellitus without complication ([place])    type 2  . Dyslipidemia   . Dyspnea    with exertion  . Dysrhythmia    hx of occ palpitations  . Esophageal stricture    s/p dilation  . GERD (gastroesophageal reflux disease)   . Headache   . History of hiatal hernia   . Hyperlipidemia   . Hypertension   . Macular pucker, left eye   . Osteoarthritis   . Osteoporosis   . Tinnitus    left ear last week or so    Patient Active Problem List   Diagnosis Date Noted  . Hyponatremia 12/22/2017  . Dysuria 01/22/2017  . Pelvic prolapse  08/28/2016  . Tremor 01/29/2016  . Abnormality of gait 01/29/2016  . Type 2 diabetes mellitus with hyperlipidemia ([place]) 01/14/2016  . Age-related macular degeneration 07/13/2015  . Keratoconjunctivitis sicca of both eyes ([place]) 07/13/2015  . Meibomian gland dysfunction (MGD), bilateral, both upper and lower lids 07/13/2015  . Pseudophakia of both eyes 07/13/2015  . Osteoarthritis of lumbar spine 05/08/2015  . Spondylosis of lumbar region without myelopathy or radiculopathy 09/01/2014  . Diabetes type 2, controlled ([place]) 04/26/2014  . Vitamin D deficiency 04/26/2014  . Chronic cystitis 04/26/2014  . Atrophic vaginitis 04/26/2014  . Solitary pulmonary nodule 03/20/2014  . Chest pain 03/18/2014  . Osteoporotic compression fracture of spine with routine healing 03/08/2014  . PALPITATIONS 02/27/2010  . Coronary atherosclerosis 12/12/2009  . Aortic aneurysm of unspecified site without mention of rupture 06/27/2009  . DYSPNEA 06/27/2009  . Hyperlipidemia associated with type 2 diabetes mellitus ([place]) 12/12/2008  . Essential hypertension 12/12/2008  . GERD 12/12/2008  . OSTEOARTHRITIS 12/12/2008    Past Surgical History:  Procedure Laterality Date  . ABDOMINAL HYSTERECTOMY     partial  . APPENDECTOMY    . BACK SURGERY     lower x 1  . CARDIAC CATHETERIZATION  2010   both cataracts  . CHOLECYSTECTOMY  [place] N/A 08/28/2016   Procedure: CYSTOSCOPY;  Surgeon: Carolan Clines, MD;  Location: [facility];  Service: Urology;  Laterality: N/A;  . EYE SURGERY     cataracts  . EYE SURGERY     for macular pucker  . FRACTURE SURGERY     rt wrist2010  . HARDWARE REMOVAL  12/10/2011   Procedure: HARDWARE REMOVAL;  Surgeon: Schuyler Amor, MD;  Location: [place];  Service: Orthopedics;  Laterality: Right;  radial head  . KNEE ARTHROSCOPY  2004   Left and right  . KYPHOSIS SURGERY    . LUMBAR FUSION  [id number]  . RADIAL HEAD ARTHROPLASTY  5/12   orif rt radial  head  . SHOULDER SURGERY     Right rotator cuff epair  . TOTAL KNEE ARTHROPLASTY     Left  . VAGINAL PROLAPSE REPAIR N/A 08/28/2016   Procedure: ANTERIOR VAGINAL VAULT SUSPENSION WITH SACROSPINOUS FIXATION;  Surgeon: Carolan Clines, MD;  Location: [facility];  Service: Urology;  Laterality: N/A;     OB History   No obstetric history on file.      Home Medications    Prior to Admission medications   Medication Sig Start Date End Date Taking? Authorizing Provider  aspirin EC 81 MG tablet Take 81 mg by mouth daily.    [provider]  Calcium Carb-Cholecalciferol 600-500 MG-UNIT CAPS Take 1 capsule by mouth every morning.    [provider]  Cholecalciferol 1000 UNITS tablet Take 1,000 Units by mouth daily.     [provider]  clindamycin (CLEOCIN) 300 MG capsule Take 1 capsule (300 mg total) by mouth 4 (four) times daily for 7 days. 04/04/19 04/11/19  [name], DO  diltiazem (CARDIZEM CD) 180 MG 24 hr capsule TAKE (1) CAPSULE DAILY 02/01/19   Chipper Herb, MD  ESTRACE VAGINAL 0.1 MG/GM vaginal cream Place 1 Applicatorful vaginally once a week. 09/29/18   Chipper Herb, MD  hydroxypropyl methylcellulose / hypromellose (ISOPTO TEARS / GONIOVISC) 2.5 % ophthalmic solution Place 1 drop into both eyes 3 (three) times daily as needed for dry eyes.    [provider]  Lancets [facility] ULTRASOFT) lancets CHECK BLOOD SUGAR ONCE A DAY 09/14/18   Chipper Herb, MD  lisinopril (ZESTRIL) 40 MG tablet Take 1 tablet (40 mg total) by mouth daily. 04/01/19   [name], DO  metFORMIN (GLUCOPHAGE) 1000 MG tablet Take 1 tablet (1,000 mg total) by mouth 2 (two) times daily with a meal. 09/29/18   Chipper Herb, MD  mirtazapine (REMERON) 7.5 MG tablet Take 1 tablet (7.5 mg total) by mouth at bedtime. 03/31/19   Loman Brooklyn, FNP  MYRBETRIQ 25 MG TB24 tablet Take 1 tablet (25 mg total) by mouth daily. 04/04/19   [name], DO  nitroGLYCERIN  (NITROSTAT) 0.4 MG SL tablet Place 1 tablet (0.4 mg total) under the tongue every 5 (five) minutes as needed for chest pain. 11/02/15   Minus Breeding, MD  omeprazole (PRILOSEC) 40 MG capsule Take 1 capsule (40 mg total) by mouth 2 (two) times daily. 02/23/19   Chipper Herb, MD  ONE TOUCH ULTRA TEST test strip CHECK BLOOD SUGAR ONCE A DAY 07/13/18   Chipper Herb, MD  pravastatin (PRAVACHOL) 40 MG tablet Take 1 tablet (40 mg total) by mouth daily. 04/01/19   [name], DO    Family History Family History  Problem Relation Age of Onset  .  Pneumonia Mother   . Brain cancer Father   . Tremor Father   . Arthritis Sister   . COPD Sister   . Emphysema Brother   . COPD Brother   . Tremor Brother   . Lung cancer Brother   . Heart disease Brother   . Liver cancer Brother   . Diabetes Other         1 child has diabetes at age 14 and the other 54 are healthy    Social History Social History   Tobacco Use  . Smoking status: Never Smoker  . Smokeless tobacco: Never Used  Substance Use Topics  . Alcohol use: No  . Drug use: No     Allergies   Codeine, Doxycycline, Other, Sulfonamide derivatives, and Vancomycin   Review of Systems Review of Systems  All other systems reviewed and are negative.    Physical Exam Updated Vital Signs BP (!) 150/96 (BP Location: Right Arm)   Pulse 99   Temp 97.7 F (36.5 C) (Oral)   Resp 18   Ht 1.651 m (5\' 5" )   Wt 69.9 kg   SpO2 99%   BMI 25.63 kg/m   Physical Exam Vitals signs and nursing note reviewed.  Constitutional:      General: She is not in acute distress.    Appearance: She is well-developed.  HENT:     Head: Normocephalic.     Comments: Bruising R forehead / temporal region    Mouth/Throat:     Pharynx: No oropharyngeal exudate.  Eyes:     General: No scleral icterus.       Right eye: No discharge.        Left eye: No discharge.     Conjunctiva/sclera: Conjunctivae normal.     Pupils: Pupils are equal,  round, and reactive to light.  Neck:     Musculoskeletal: Normal range of motion and neck supple.     Thyroid: No thyromegaly.     Vascular: No JVD.  Cardiovascular:     Rate and Rhythm: Normal rate and regular rhythm.     Heart sounds: Normal heart sounds. No murmur. No friction rub. No gallop.   Pulmonary:     Effort: Pulmonary effort is normal. No respiratory distress.     Breath sounds: Normal breath sounds. No wheezing or rales.  Abdominal:     General: Bowel sounds are normal. There is no distension.     Palpations: Abdomen is soft. There is no mass.     Tenderness: There is no abdominal tenderness.  Musculoskeletal: Normal range of motion.        General: Swelling and tenderness present. No deformity or signs of injury.     Right lower leg: No edema.     Left lower leg: Edema present.     Comments: Has edematous L leg from toes to knee, asymetry present, pitting edema, no heat slight pink color.  Lymphadenopathy:     Cervical: No cervical adenopathy.  Skin:    General: Skin is warm and dry.     Findings: Erythema ( mild to left leg) present. No rash.  Neurological:     Mental Status: She is alert.     Coordination: Coordination normal.     Comments: Speech clear, follows commands - no facial droop or ataxia.  Psychiatric:        Behavior: Behavior normal.      ED Treatments / Results  Labs (all labs ordered are listed, but only  abnormal results are displayed) Labs Reviewed  COMPREHENSIVE METABOLIC PANEL  CBC WITH DIFFERENTIAL/PLATELET  LACTIC ACID, PLASMA  PROTIME-INR    EKG None  Radiology No results found.  Procedures Procedures (including critical care time)  Medications Ordered in ED Medications - No data to display   Initial Impression / Assessment and Plan / ED Course  I have reviewed the triage vital signs and the nursing notes.  Pertinent labs & imaging results that were available during my care of the patient were reviewed by me and  considered in my medical decision making (see chart for details).  Clinical Course as of Apr 14 1454  Mon Apr 11, 2019  1345 I have personally looked at the CT scan of the brain as well as the x-rays of the left hip, my interpretation is that there is no bleeding of the brain or abnormalities of concern and the x-ray of the hip shows no signs of fractures according to my interpretation.  I agree with the radiologist.   [BM]  2876 US Venous Img Lower Unilateral Left [EW]  1730 Abnormal, elevated  Lactic acid, plasma(!!) [EW]  1730 Normal  Protime-INR [EW]  1730 Normal except hemoglobin low  CBC with Differential/Platelet(!) [EW]  1730 Normal except sodium low, potassium low, chloride low, glucose high, total protein low, abdomen low  Comprehensive metabolic panel(!) [EW]  8115 No acute intra-abdominal mass appreciated.  Nonspecific mass left greater trochanter region, consistent with hematoma versus abscess.  Images reviewed by me  CT ABDOMEN PELVIS W CONTRAST [EW]  7262 No PE or pneumonia.  No evidence for aortic dissection.  Images reviewed by me  CT Angio Chest PE W and/or Wo Contrast [EW]    Clinical Course User Index [BM] Noemi Chapel, MD [EW] Daleen Bo, MD       We will need to make sure the patient has not had a significant head injury from her fall, will also check for hip fracture and rule out DVT given her significant edema left compared to right.  She does not have any hypoxia, she is not tachycardic on my exam, her blood pressure is slightly elevated at 150/96 and she is afebrile.  This appears less likely to be cellulitis and more likely to be something like a DVT however given the recent negative ultrasound we will need to repeat it to be sure.  Final Clinical Impressions(s) / ED Diagnoses   Final diagnoses:  Fall, initial encounter  Injury of head, initial encounter  Contusion of left hip, initial encounter  Left leg swelling  Hyponatremia  Anemia, unspecified  type      Noemi Chapel, MD 04/15/19 1456

## 2019-04-18 ENCOUNTER — Telehealth: Payer: Self-pay | Admitting: Family Medicine

## 2019-04-18 ENCOUNTER — Other Ambulatory Visit: Payer: Self-pay | Admitting: Family Medicine

## 2019-04-18 DIAGNOSIS — R630 Anorexia: Secondary | ICD-10-CM

## 2019-04-18 DIAGNOSIS — K219 Gastro-esophageal reflux disease without esophagitis: Secondary | ICD-10-CM

## 2019-04-18 DIAGNOSIS — Z8719 Personal history of other diseases of the digestive system: Secondary | ICD-10-CM

## 2019-04-18 NOTE — Telephone Encounter (Signed)
Appt made to see MMM on 8/25 at 8:30am.

## 2019-04-18 NOTE — Telephone Encounter (Signed)
I do not want her waiting that long if she has an active infection.  Please encourage her to be seen with a different provider.

## 2019-04-19 ENCOUNTER — Encounter: Payer: Self-pay | Admitting: Nurse Practitioner

## 2019-04-19 ENCOUNTER — Ambulatory Visit (INDEPENDENT_AMBULATORY_CARE_PROVIDER_SITE_OTHER): Payer: Medicare Other | Admitting: Nurse Practitioner

## 2019-04-19 ENCOUNTER — Other Ambulatory Visit: Payer: Self-pay

## 2019-04-19 VITALS — BP 173/93 | HR 79 | Temp 97.8°F | Ht 65.0 in | Wt 157.0 lb

## 2019-04-19 DIAGNOSIS — L03119 Cellulitis of unspecified part of limb: Secondary | ICD-10-CM

## 2019-04-19 DIAGNOSIS — N3000 Acute cystitis without hematuria: Secondary | ICD-10-CM

## 2019-04-19 DIAGNOSIS — R300 Dysuria: Secondary | ICD-10-CM

## 2019-04-19 LAB — URINALYSIS, COMPLETE
Bilirubin, UA: NEGATIVE
Glucose, UA: NEGATIVE
Ketones, UA: NEGATIVE
Nitrite, UA: NEGATIVE
Specific Gravity, UA: 1.02 (ref 1.005–1.030)
Urobilinogen, Ur: 0.2 mg/dL (ref 0.2–1.0)
pH, UA: 7.5 (ref 5.0–7.5)

## 2019-04-19 LAB — MICROSCOPIC EXAMINATION
Renal Epithel, UA: NONE SEEN /hpf
WBC, UA: >30 /hpf — AB (ref 0–5)

## 2019-04-19 MED ORDER — CIPROFLOXACIN HCL 500 MG PO TABS: 500.0000 mg | ORAL_TABLET | Freq: Two times a day (BID) | ORAL | 0 refills | Status: DC

## 2019-04-19 MED ORDER — FUROSEMIDE 20 MG PO TABS: 10.0000 mg | ORAL_TABLET | Freq: Every day | ORAL | 3 refills | Status: DC

## 2019-04-19 NOTE — Patient Instructions (Signed)

## 2019-04-19 NOTE — Progress Notes (Signed)
Subjective:    Patient ID: Debra Phillips, female    DOB: 25-Aug-1929, 85 y.o.   MRN: [id number]   Chief Complaint: Dysuria and Left leg swelling and red   HPI - dysuria- strated 2 days ago. Having frequency and urgency especially at night. Starting to feel a little weak this morning. - left leg swelling- red hot to touch and painful when walking. Started over a week ago.    Review of Systems  Constitutional: Negative for activity change and appetite change.  HENT: Negative.   Eyes: Negative for pain.  Respiratory: Negative for shortness of breath.   Cardiovascular: Positive for leg swelling. Negative for chest pain and palpitations.  Gastrointestinal: Negative for abdominal pain.  Endocrine: Negative for polydipsia.  Genitourinary: Positive for dysuria, frequency and urgency.  Skin: Negative for rash.  Neurological: Negative for dizziness, weakness and headaches.  Hematological: Does not bruise/bleed easily.  Psychiatric/Behavioral: Negative.   All other systems reviewed and are negative.      Objective:   Physical Exam Vitals signs and nursing note reviewed.  Constitutional:      General: She is not in acute distress.    Appearance: Normal appearance. She is well-developed.  HENT:     Head: Normocephalic.     Nose: Nose normal.  Eyes:     Pupils: Pupils are equal, round, and reactive to light.  Neck:     Musculoskeletal: Normal range of motion and neck supple.     Vascular: No carotid bruit or JVD.  Cardiovascular:     Rate and Rhythm: Normal rate and regular rhythm.     Heart sounds: Normal heart sounds.  Pulmonary:     Effort: Pulmonary effort is normal. No respiratory distress.     Breath sounds: Normal breath sounds. No wheezing or rales.  Chest:     Chest wall: No tenderness.  Abdominal:     General: Bowel sounds are normal. There is no distension or abdominal bruit.     Palpations: Abdomen is soft. There is no hepatomegaly, splenomegaly, mass or  pulsatile mass.     Tenderness: There is no abdominal tenderness.  Musculoskeletal: Normal range of motion.     Right lower leg: Edema (1+) present.     Left lower leg: Edema (2+ with erythema and hot to touch) present.  Lymphadenopathy:     Cervical: No cervical adenopathy.  Skin:    General: Skin is warm and dry.  Neurological:     Mental Status: She is alert and oriented to person, place, and time.     Deep Tendon Reflexes: Reflexes are normal and symmetric.  Psychiatric:        Behavior: Behavior normal.        Thought Content: Thought content normal.        Judgment: Judgment normal.     BP (!) 173/93   Pulse 79   Temp 97.8 F (36.6 C) (Oral)   Ht 5\' 5"  (1.651 m)   Wt 157 lb (71.2 kg)   BMI 26.13 kg/m   See UA results      Assessment & Plan:  Debra Phillips in today with chief complaint of Dysuria and Left leg swelling and red   1. Dysuria - Urinalysis, Complete  2. Acute cystitis without hematuria Take medication as prescribe Cotton underwear Take shower not bath Cranberry juice, yogurt Force fluids AZO over the counter X2 days Culture pending RTO prn  - ciprofloxacin (CIPRO) 500 MG tablet; Take 1 tablet (  500 mg total) by mouth 2 (two) times daily.  Dispense: 20 tablet; Refill: 0  3. Cellulitis of lower extremity, unspecified laterality Elevate legs when sitting - furosemide (LASIX) 20 MG tablet; Take 0.5 tablets (10 mg total) by mouth daily.  Dispense: 30 tablet; Refill: 3 - ciprofloxacin (CIPRO) 500 MG tablet; Take 1 tablet (500 mg total) by mouth 2 (two) times daily.  Dispense: 20 tablet; Refill: 0  Mary-Margaret Hassell Done, FNP

## 2019-04-20 ENCOUNTER — Encounter: Payer: Self-pay | Admitting: Internal Medicine

## 2019-04-25 ENCOUNTER — Other Ambulatory Visit: Payer: Self-pay

## 2019-04-26 ENCOUNTER — Encounter: Payer: Self-pay | Admitting: Family Medicine

## 2019-04-26 ENCOUNTER — Ambulatory Visit (INDEPENDENT_AMBULATORY_CARE_PROVIDER_SITE_OTHER): Payer: Medicare Other | Admitting: Family Medicine

## 2019-04-26 VITALS — BP 148/80 | HR 74 | Temp 96.8°F | Ht 65.0 in | Wt 155.0 lb

## 2019-04-26 DIAGNOSIS — R2681 Unsteadiness on feet: Secondary | ICD-10-CM

## 2019-04-26 DIAGNOSIS — E871 Hypo-osmolality and hyponatremia: Secondary | ICD-10-CM | POA: Diagnosis not present

## 2019-04-26 DIAGNOSIS — M25512 Pain in left shoulder: Secondary | ICD-10-CM

## 2019-04-26 DIAGNOSIS — R0789 Other chest pain: Secondary | ICD-10-CM

## 2019-04-26 DIAGNOSIS — G8929 Other chronic pain: Secondary | ICD-10-CM

## 2019-04-26 DIAGNOSIS — R1030 Lower abdominal pain, unspecified: Secondary | ICD-10-CM | POA: Diagnosis not present

## 2019-04-26 DIAGNOSIS — R296 Repeated falls: Secondary | ICD-10-CM | POA: Diagnosis not present

## 2019-04-26 DIAGNOSIS — R5381 Other malaise: Secondary | ICD-10-CM | POA: Diagnosis not present

## 2019-04-26 LAB — URINALYSIS, COMPLETE
Bilirubin, UA: NEGATIVE
Glucose, UA: NEGATIVE
Ketones, UA: NEGATIVE
Leukocytes,UA: NEGATIVE
Nitrite, UA: NEGATIVE
Protein,UA: NEGATIVE
RBC, UA: NEGATIVE
Specific Gravity, UA: 1.015 (ref 1.005–1.030)
Urobilinogen, Ur: 0.2 mg/dL (ref 0.2–1.0)
pH, UA: 7 (ref 5.0–7.5)

## 2019-04-26 LAB — MICROSCOPIC EXAMINATION
Epithelial Cells (non renal): >10 /hpf — AB (ref 0–10)
RBC, Urine: NONE SEEN /hpf (ref 0–2)
Renal Epithel, UA: NONE SEEN /hpf

## 2019-04-26 NOTE — Progress Notes (Signed)
Subjective: CC:rib pain PCP: [name], DO [id number] Debra Phillips is a 85 y.o. female presenting to clinic today for:  1.  Left-sided rib pain/lower abdominal pain Patient reports ongoing left-sided rib pain.  She had a fall about a month ago.  She has had a CT chest and pelvis obtained in the emergency department about 2 weeks ago which demonstrated no fractures, dislocations or pulmonary pathology.  She did have a large amount of stool throughout the colon.  She admits to having pellet, dry stools but does stool 2-3 times daily.  She reports adequate water intake but states that she could do better with diet.  She has a follow-up with her gastroenterologist on 05/17/2019.  She is going to be seeing Dr. Glennon Hamilton.  She goes on to state that she has become less physically active as she easily runs out of breath with activity and feels generalized weakness since March.   ROS: Per HPI  Allergies  Allergen Reactions  . Codeine Nausea And Vomiting  . Doxycycline Hives  . Other Nausea Only    Almost all antibiotics  . Sulfonamide Derivatives Hives  . Vancomycin Itching   Past Medical History:  Diagnosis Date  . Abnormality of gait 01/29/2016  . Aortic root dilatation (HCC)    Mild  . Cataract   . Diabetes mellitus without complication ([place])    type 2  . Dyslipidemia   . Dyspnea    with exertion  . Dysrhythmia    hx of occ palpitations  . Esophageal stricture    s/p dilation  . GERD (gastroesophageal reflux disease)   . Headache   . History of hiatal hernia   . Hyperlipidemia   . Hypertension   . Macular pucker, left eye   . Osteoarthritis   . Osteoporosis   . Tinnitus    left ear last week or so    Current Outpatient Medications:  .  aspirin EC 81 MG tablet, Take 81 mg by mouth daily., Disp: , Rfl:  .  Calcium Carb-Cholecalciferol 600-500 MG-UNIT CAPS, Take 1 capsule by mouth every morning., Disp: , Rfl:  .  Cholecalciferol 1000 UNITS tablet, Take 1,000 Units by  mouth daily. , Disp: , Rfl:  .  ciprofloxacin (CIPRO) 500 MG tablet, Take 1 tablet (500 mg total) by mouth 2 (two) times daily., Disp: 20 tablet, Rfl: 0 .  diltiazem (CARDIZEM CD) 180 MG 24 hr capsule, TAKE (1) CAPSULE DAILY (Patient taking differently: Take 180 mg by mouth daily. TAKE (1) CAPSULE DAILY), Disp: 90 capsule, Rfl: 0 .  ESTRACE VAGINAL 0.1 MG/GM vaginal cream, Place 1 Applicatorful vaginally once a week. (Patient taking differently: Place 1 Applicatorful vaginally daily as needed. ), Disp: 42.5 g, Rfl: 1 .  furosemide (LASIX) 20 MG tablet, Take 0.5 tablets (10 mg total) by mouth daily., Disp: 30 tablet, Rfl: 3 .  hydroxypropyl methylcellulose / hypromellose (ISOPTO TEARS / GONIOVISC) 2.5 % ophthalmic solution, Place 1 drop into both eyes 3 (three) times daily as needed for dry eyes., Disp: , Rfl:  .  Lancets (ONETOUCH ULTRASOFT) lancets, CHECK BLOOD SUGAR ONCE A DAY, Disp: 50 each, Rfl: 9 .  lisinopril (ZESTRIL) 40 MG tablet, Take 1 tablet (40 mg total) by mouth daily., Disp: 90 tablet, Rfl: 0 .  metFORMIN (GLUCOPHAGE) 1000 MG tablet, Take 1 tablet (1,000 mg total) by mouth 2 (two) times daily with a meal., Disp: 180 tablet, Rfl: 3 .  mirtazapine (REMERON) 7.5 MG tablet, Take 1 tablet (7.5  mg total) by mouth at bedtime., Disp: 30 tablet, Rfl: 2 .  MYRBETRIQ 25 MG TB24 tablet, Take 1 tablet (25 mg total) by mouth daily., Disp: 30 tablet, Rfl: 0 .  nitroGLYCERIN (NITROSTAT) 0.4 MG SL tablet, Place 1 tablet (0.4 mg total) under the tongue every 5 (five) minutes as needed for chest pain., Disp: 25 tablet, Rfl: 3 .  omeprazole (PRILOSEC) 40 MG capsule, Take 1 capsule (40 mg total) by mouth 2 (two) times daily., Disp: 180 capsule, Rfl: 1 .  ONE TOUCH ULTRA TEST test strip, CHECK BLOOD SUGAR ONCE A DAY, Disp: 50 each, Rfl: 11 .  pravastatin (PRAVACHOL) 40 MG tablet, Take 1 tablet (40 mg total) by mouth daily., Disp: 90 tablet, Rfl: 0 Social History   Socioeconomic History  . Marital status:  Widowed    Spouse name: Not on file  . Number of children: 5  . Years of education: 5  . Highest education level: Not on file  Occupational History  . 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 Financial resource strain: Not very hard  . Food insecurity    Worry: Never true    Inability: Never true  . Transportation needs    Medical: No    Non-medical: No  Tobacco Use  . Smoking status: Never Smoker  . Smokeless tobacco: Never Used  Substance and Sexual Activity  . Alcohol use: No  . Drug use: No  . Sexual activity: Not Currently  Lifestyle  . Physical activity    Days per week: 3 days    Minutes per session: 10 min  . Stress: Only a little  Relationships  . Social connections    Talks on phone: More than three times a week    Gets together: More than three times a week    Attends religious service: More than 4 times per year    Active member of club or organization: Yes    Attends meetings of clubs or organizations: More than 4 times per year    Relationship status: Widowed  . Intimate partner violence    Fear of current or ex partner: No    Emotionally abused: No    Physically abused: No    Forced sexual activity: No  Other Topics Concern  . Not on file  Social History Narrative   Pt lives at home alone   Husband w/ Alzheimer's passed away   Right-handed   Drinks 1-2 cups of coffee daily   Family History  Problem Relation Age of Onset  . Pneumonia Mother   . Brain cancer Father   . Tremor Father   . Arthritis Sister   . COPD Sister   . Emphysema Brother   . COPD Brother   . Tremor Brother   . Lung cancer Brother   . Heart disease Brother   . Liver cancer Brother   . Diabetes Other         1 child has diabetes at age 35 and the other 4 are healthy    Objective: Office vital signs reviewed. BP (!) 148/80 Comment: manual  Pulse 74   Temp (!) 96.8 F (36 C) (Temporal)   Ht 5\' 5"  (1.651 m)   Wt 155  lb (70.3 kg)   BMI 25.79 kg/m   Physical Examination:  General: Awake, alert, No acute distress Cardio: regular rate  Pulm: no wheezes. normal work of breathing on room air Chest:  TTP along lateral left ribs 3-7 Extremities: warm, well perfused, 1+ pitting edema LLE. She has erythema along the shin of the LLE.  No increased warmth.  No cyanosis or clubbing; +2 pulses bilaterally MSK: antalgic gait and station; 4/5 LE strength; decreased muscle tone. Using cane for ambulation Skin: as above; palpable hematoma along the left hip but ecchymosis has resolved.  Assessment/ Plan: 85 y.o. female   1. Musculoskeletal chest pain Likely related to the fall.  I reviewed her CAT scan results from 04/11/2019 which demonstrated no acute abnormalities concerning for intrapulmonary or musculoskeletal pathology.  2. Chronic left shoulder pain As above  3. Physical deconditioning She likely would benefit from physical therapy.  Because she resides alone and cannot transport herself, I have ordered home health. - Ambulatory referral to [place]  4. Lower abdominal pain Check UA to rule out UTI.  Likely related to chronic constipation.  We discussed increased fiber.  Continue adequate oral hydration.  Follow-up with GI as prescribed - Urinalysis, Complete  5. Frequent falls PT as above - Ambulatory referral to [place]  6. Unstable gait - Ambulatory referral to Home Health  7. Hyponatremia Possibly related to excess water consumption.  Check BMP. - Basic Metabolic Panel   No orders of the defined types were placed in this encounter.  No orders of the defined types were placed in this encounter.    [name], DO [place] [phone]

## 2019-04-26 NOTE — Patient Instructions (Addendum)
I am trying to arrange physical therapy to come to your home to help you strengthen.  We discussed your CT from the ED visit showed a lot of stool in your colon.  Increase fiber.  You had labs performed today.  You will be contacted with the results of the labs once they are available, usually in the next 3 business days for routine lab work.  If you have an active my chart account, they will be released to your MyChart.  If you prefer to have these labs released to you via telephone, please let us know.  If you had a pap smear or biopsy performed, expect to be contacted in about 7-10 days.  Constipation, Adult Constipation is when a person:  Poops (has a bowel movement) fewer times in a week than normal.  Has a hard time pooping.  Has poop that is dry, hard, or bigger than normal. Follow these instructions at home: Eating and drinking   Eat foods that have a lot of fiber, such as: ? Fresh fruits and vegetables. ? Whole grains. ? Beans.  Eat less of foods that are high in fat, low in fiber, or overly processed, such as: ? [place] fries. ? Hamburgers. ? Cookies. ? Candy. ? Soda.  Drink enough fluid to keep your pee (urine) clear or pale yellow. General instructions  Exercise regularly or as told by your doctor.  Go to the restroom when you feel like you need to poop. Do not hold it in.  Take over-the-counter and prescription medicines only as told by your doctor. These include any fiber supplements.  Do pelvic floor retraining exercises, such as: ? Doing deep breathing while relaxing your lower belly (abdomen). ? Relaxing your pelvic floor while pooping.  Watch your condition for any changes.  Keep all follow-up visits as told by your doctor. This is important. Contact a doctor if:  You have pain that gets worse.  You have a fever.  You have not pooped for 4 days.  You throw up (vomit).  You are not hungry.  You lose weight.  You are bleeding from the  anus.  You have thin, pencil-like poop (stool). Get help right away if:  You have a fever, and your symptoms suddenly get worse.  You leak poop or have blood in your poop.  Your belly feels hard or bigger than normal (is bloated).  You have very bad belly pain.  You feel dizzy or you faint. This information is not intended to replace advice given to you by your health care provider. Make sure you discuss any questions you have with your health care provider. Document Released: 01/28/2008 Document Revised: 07/24/2017 Document Reviewed: 01/30/2016 Elsevier Patient Education  2020 [employer].

## 2019-04-27 ENCOUNTER — Other Ambulatory Visit: Payer: Self-pay | Admitting: *Deleted

## 2019-04-27 DIAGNOSIS — R300 Dysuria: Secondary | ICD-10-CM

## 2019-04-27 LAB — BASIC METABOLIC PANEL
BUN/Creatinine Ratio: 11 — ABNORMAL LOW (ref 12–28)
BUN: 7 mg/dL — ABNORMAL LOW (ref 8–27)
CO2: 23 mmol/L (ref 20–29)
Calcium: 9.2 mg/dL (ref 8.7–10.3)
Chloride: 98 mmol/L (ref 96–106)
Creatinine, Ser: 0.66 mg/dL (ref 0.57–1.00)
GFR calc Af Amer: 91 mL/min/{1.73_m2} (ref >59)
GFR calc non Af Amer: 79 mL/min/{1.73_m2} (ref >59)
Glucose: 160 mg/dL — ABNORMAL HIGH (ref 65–99)
Potassium: 4.2 mmol/L (ref 3.5–5.2)
Sodium: 136 mmol/L (ref 134–144)

## 2019-04-27 MED ORDER — MIRABEGRON ER 25 MG PO TB24: 25.0000 mg | ORAL_TABLET | Freq: Every day | ORAL | 0 refills | Status: DC

## 2019-04-30 DIAGNOSIS — Z7982 Long term (current) use of aspirin: Secondary | ICD-10-CM | POA: Diagnosis not present

## 2019-04-30 DIAGNOSIS — E119 Type 2 diabetes mellitus without complications: Secondary | ICD-10-CM | POA: Diagnosis not present

## 2019-04-30 DIAGNOSIS — M810 Age-related osteoporosis without current pathological fracture: Secondary | ICD-10-CM | POA: Diagnosis not present

## 2019-04-30 DIAGNOSIS — H9312 Tinnitus, left ear: Secondary | ICD-10-CM | POA: Diagnosis not present

## 2019-04-30 DIAGNOSIS — K219 Gastro-esophageal reflux disease without esophagitis: Secondary | ICD-10-CM | POA: Diagnosis not present

## 2019-04-30 DIAGNOSIS — R0789 Other chest pain: Secondary | ICD-10-CM | POA: Diagnosis not present

## 2019-04-30 DIAGNOSIS — I10 Essential (primary) hypertension: Secondary | ICD-10-CM | POA: Diagnosis not present

## 2019-04-30 DIAGNOSIS — E871 Hypo-osmolality and hyponatremia: Secondary | ICD-10-CM | POA: Diagnosis not present

## 2019-04-30 DIAGNOSIS — Z7984 Long term (current) use of oral hypoglycemic drugs: Secondary | ICD-10-CM | POA: Diagnosis not present

## 2019-04-30 DIAGNOSIS — R296 Repeated falls: Secondary | ICD-10-CM | POA: Diagnosis not present

## 2019-04-30 DIAGNOSIS — H269 Unspecified cataract: Secondary | ICD-10-CM | POA: Diagnosis not present

## 2019-04-30 DIAGNOSIS — R1030 Lower abdominal pain, unspecified: Secondary | ICD-10-CM | POA: Diagnosis not present

## 2019-04-30 DIAGNOSIS — E785 Hyperlipidemia, unspecified: Secondary | ICD-10-CM | POA: Diagnosis not present

## 2019-04-30 DIAGNOSIS — I77810 Thoracic aortic ectasia: Secondary | ICD-10-CM | POA: Diagnosis not present

## 2019-04-30 DIAGNOSIS — H35372 Puckering of macula, left eye: Secondary | ICD-10-CM | POA: Diagnosis not present

## 2019-04-30 DIAGNOSIS — R51 Headache: Secondary | ICD-10-CM | POA: Diagnosis not present

## 2019-04-30 DIAGNOSIS — R2681 Unsteadiness on feet: Secondary | ICD-10-CM | POA: Diagnosis not present

## 2019-04-30 DIAGNOSIS — M1990 Unspecified osteoarthritis, unspecified site: Secondary | ICD-10-CM | POA: Diagnosis not present

## 2019-04-30 DIAGNOSIS — M25512 Pain in left shoulder: Secondary | ICD-10-CM | POA: Diagnosis not present

## 2019-05-03 ENCOUNTER — Other Ambulatory Visit: Payer: Self-pay | Admitting: Family Medicine

## 2019-05-05 DIAGNOSIS — E871 Hypo-osmolality and hyponatremia: Secondary | ICD-10-CM | POA: Diagnosis not present

## 2019-05-05 DIAGNOSIS — R2681 Unsteadiness on feet: Secondary | ICD-10-CM | POA: Diagnosis not present

## 2019-05-05 DIAGNOSIS — R0789 Other chest pain: Secondary | ICD-10-CM | POA: Diagnosis not present

## 2019-05-05 DIAGNOSIS — M25512 Pain in left shoulder: Secondary | ICD-10-CM | POA: Diagnosis not present

## 2019-05-05 DIAGNOSIS — R296 Repeated falls: Secondary | ICD-10-CM | POA: Diagnosis not present

## 2019-05-05 DIAGNOSIS — R1030 Lower abdominal pain, unspecified: Secondary | ICD-10-CM | POA: Diagnosis not present

## 2019-05-06 DIAGNOSIS — R296 Repeated falls: Secondary | ICD-10-CM | POA: Diagnosis not present

## 2019-05-06 DIAGNOSIS — R1030 Lower abdominal pain, unspecified: Secondary | ICD-10-CM | POA: Diagnosis not present

## 2019-05-06 DIAGNOSIS — R2681 Unsteadiness on feet: Secondary | ICD-10-CM | POA: Diagnosis not present

## 2019-05-06 DIAGNOSIS — E871 Hypo-osmolality and hyponatremia: Secondary | ICD-10-CM | POA: Diagnosis not present

## 2019-05-06 DIAGNOSIS — R0789 Other chest pain: Secondary | ICD-10-CM | POA: Diagnosis not present

## 2019-05-06 DIAGNOSIS — M25512 Pain in left shoulder: Secondary | ICD-10-CM | POA: Diagnosis not present

## 2019-05-10 DIAGNOSIS — R0789 Other chest pain: Secondary | ICD-10-CM | POA: Diagnosis not present

## 2019-05-10 DIAGNOSIS — E871 Hypo-osmolality and hyponatremia: Secondary | ICD-10-CM | POA: Diagnosis not present

## 2019-05-10 DIAGNOSIS — M25512 Pain in left shoulder: Secondary | ICD-10-CM | POA: Diagnosis not present

## 2019-05-10 DIAGNOSIS — R296 Repeated falls: Secondary | ICD-10-CM | POA: Diagnosis not present

## 2019-05-10 DIAGNOSIS — R2681 Unsteadiness on feet: Secondary | ICD-10-CM | POA: Diagnosis not present

## 2019-05-10 DIAGNOSIS — R1030 Lower abdominal pain, unspecified: Secondary | ICD-10-CM | POA: Diagnosis not present

## 2019-05-11 ENCOUNTER — Other Ambulatory Visit: Payer: Self-pay

## 2019-05-11 ENCOUNTER — Ambulatory Visit (INDEPENDENT_AMBULATORY_CARE_PROVIDER_SITE_OTHER): Payer: Medicare Other

## 2019-05-11 DIAGNOSIS — E871 Hypo-osmolality and hyponatremia: Secondary | ICD-10-CM | POA: Diagnosis not present

## 2019-05-11 DIAGNOSIS — I77810 Thoracic aortic ectasia: Secondary | ICD-10-CM

## 2019-05-11 DIAGNOSIS — R0789 Other chest pain: Secondary | ICD-10-CM

## 2019-05-11 DIAGNOSIS — I10 Essential (primary) hypertension: Secondary | ICD-10-CM

## 2019-05-11 DIAGNOSIS — R1030 Lower abdominal pain, unspecified: Secondary | ICD-10-CM

## 2019-05-11 DIAGNOSIS — M1990 Unspecified osteoarthritis, unspecified site: Secondary | ICD-10-CM | POA: Diagnosis not present

## 2019-05-11 DIAGNOSIS — K219 Gastro-esophageal reflux disease without esophagitis: Secondary | ICD-10-CM

## 2019-05-11 DIAGNOSIS — E119 Type 2 diabetes mellitus without complications: Secondary | ICD-10-CM

## 2019-05-11 DIAGNOSIS — R296 Repeated falls: Secondary | ICD-10-CM | POA: Diagnosis not present

## 2019-05-11 DIAGNOSIS — M810 Age-related osteoporosis without current pathological fracture: Secondary | ICD-10-CM | POA: Diagnosis not present

## 2019-05-11 DIAGNOSIS — R2681 Unsteadiness on feet: Secondary | ICD-10-CM | POA: Diagnosis not present

## 2019-05-11 DIAGNOSIS — M25512 Pain in left shoulder: Secondary | ICD-10-CM | POA: Diagnosis not present

## 2019-05-11 DIAGNOSIS — R51 Headache: Secondary | ICD-10-CM

## 2019-05-11 DIAGNOSIS — E785 Hyperlipidemia, unspecified: Secondary | ICD-10-CM | POA: Diagnosis not present

## 2019-05-12 DIAGNOSIS — E871 Hypo-osmolality and hyponatremia: Secondary | ICD-10-CM | POA: Diagnosis not present

## 2019-05-12 DIAGNOSIS — M25512 Pain in left shoulder: Secondary | ICD-10-CM | POA: Diagnosis not present

## 2019-05-12 DIAGNOSIS — R2681 Unsteadiness on feet: Secondary | ICD-10-CM | POA: Diagnosis not present

## 2019-05-12 DIAGNOSIS — R1030 Lower abdominal pain, unspecified: Secondary | ICD-10-CM | POA: Diagnosis not present

## 2019-05-12 DIAGNOSIS — R296 Repeated falls: Secondary | ICD-10-CM | POA: Diagnosis not present

## 2019-05-12 DIAGNOSIS — R0789 Other chest pain: Secondary | ICD-10-CM | POA: Diagnosis not present

## 2019-05-12 IMAGING — CR DG CHEST 1V
1 series · 1 of 1 positions shown · non-contrast
Comparison: 06/13/2016

CLINICAL DATA: Fell tonight.  Pain in the neck and head.

EXAM:
CHEST  1 VIEW

[chest ap]
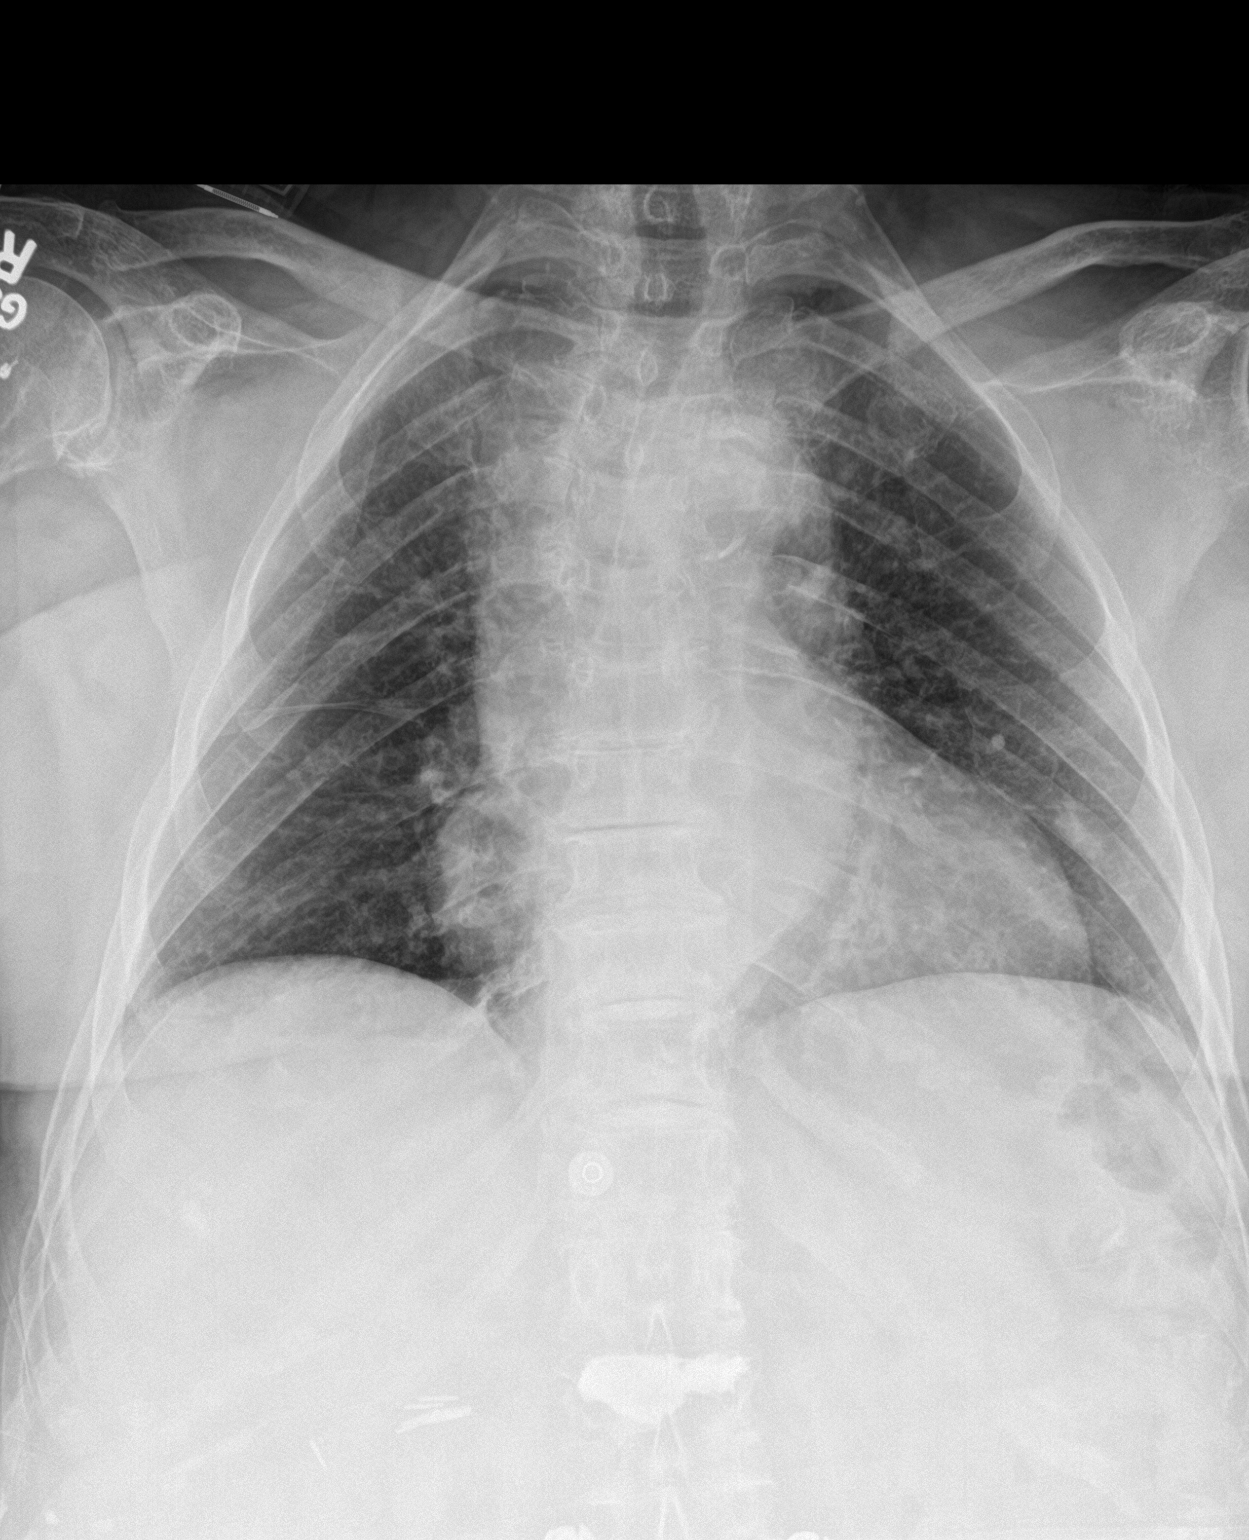

[1 of 1 positions shown; findings below may reference images not displayed]

FINDINGS: Cardiac enlargement. No vascular congestion or edema. No blunting of
costophrenic angles. No pneumothorax. Calcified and tortuous aorta.
Surgical clips in the right upper quadrant. Previous kyphoplasty
changes in the lumbar spine.
IMPRESSION: Cardiac enlargement. No evidence of active pulmonary disease. Aortic
atherosclerosis.

## 2019-05-12 IMAGING — CT CT CERVICAL SPINE W/O CM
5 of 8 series · 12 of 33 positions shown, 13 images · non-contrast
Comparison: CT brain 02/06/2016, radiograph 01/14/2016

CLINICAL DATA: Fell in kitchen hematoma to the posterior head and
neck

EXAM:
CT HEAD WITHOUT CONTRAST
CT CERVICAL SPINE WITHOUT CONTRAST
TECHNIQUE: Multidetector CT imaging of the head and cervical spine was
performed following the standard protocol without intravenous
contrast. Multiplanar CT image reconstructions of the cervical spine
were also generated.

[Series 4: head bone · axial · 0.44mm/px · z∈[-48,+8]mm · 2 of 85 slices shown]
[im 29/85  bone]
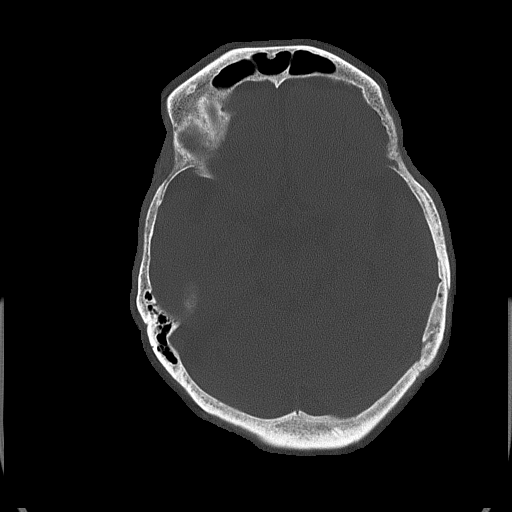
[im 57/85  bone]
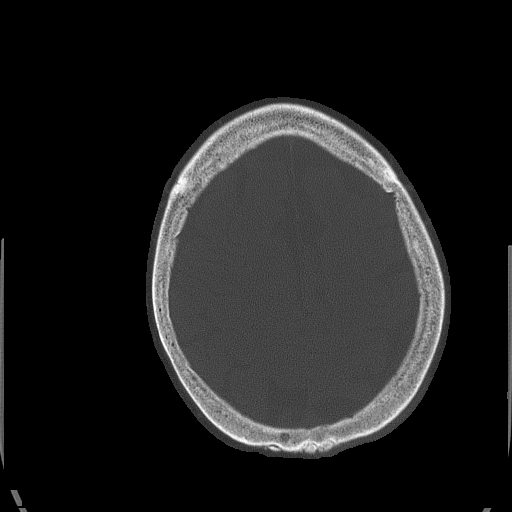

[Series 8: c_spine 2.0 st · axial · 0.30mm/px · z∈[-204,-144]mm · 2 of 91 slices shown]
[im 31/91  bone]
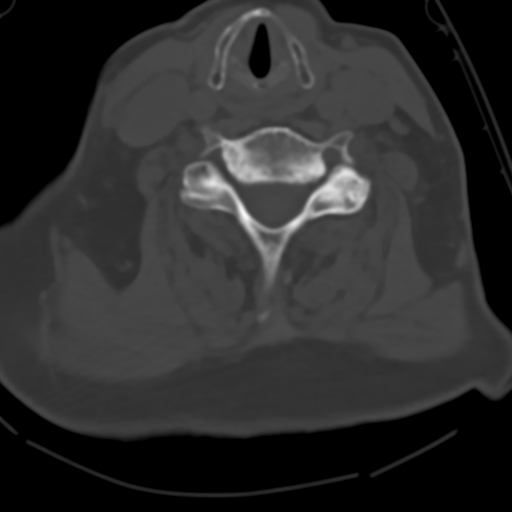
[im 61/91  bone]
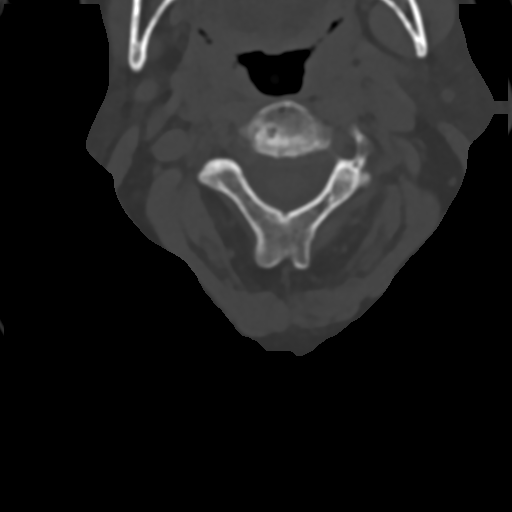

[Series 12: c_spine 2.0 sag bone · sagittal · 0.27mm/px · 5 of 61 slices shown]
[im 11/61  bone]
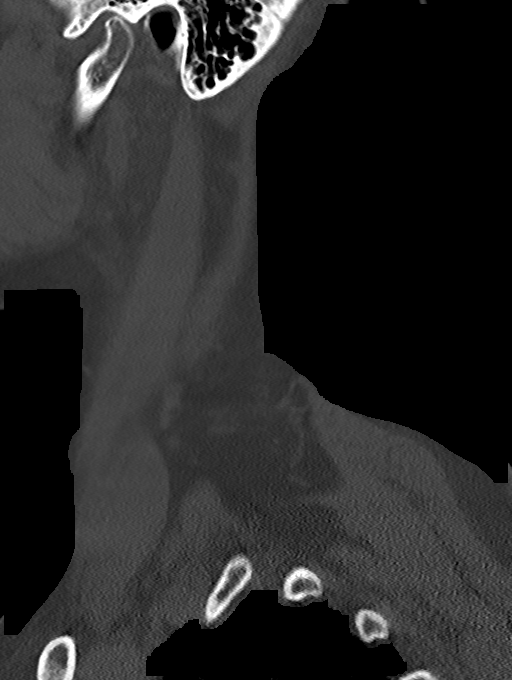
[im 21/61  bone]
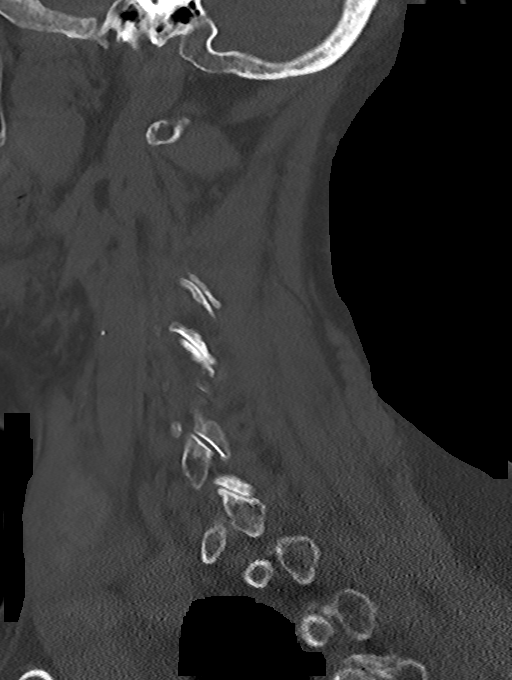
[im 31/61  bone]
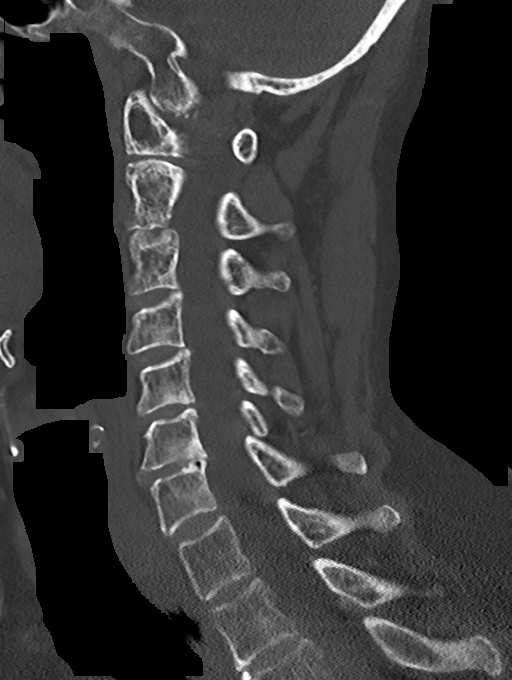
[im 41/61  bone]
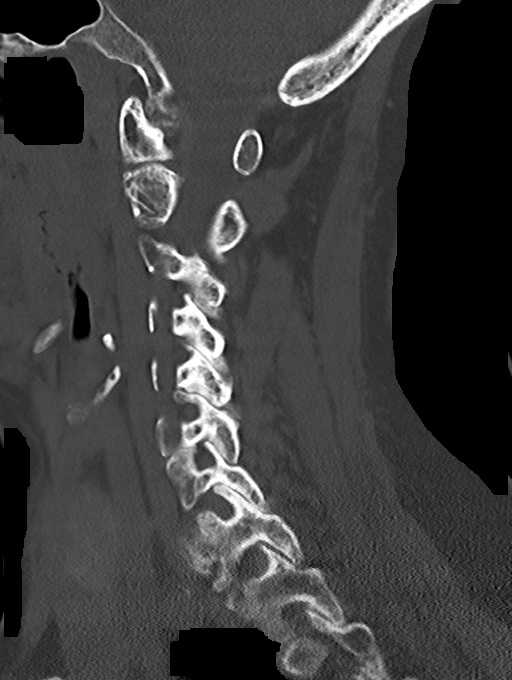
[im 51/61  bone]
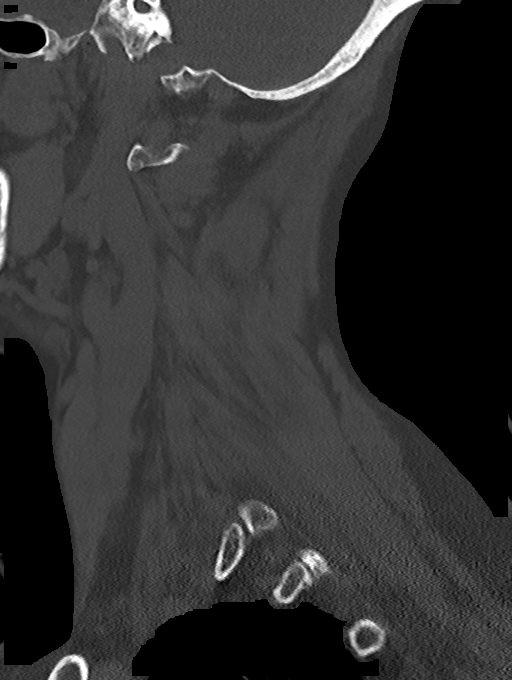

[Series 13: c_spine 2.0 cor bone · coronal · 0.27mm/px · 1 of 61 slices shown]
[im 31/61  bone]
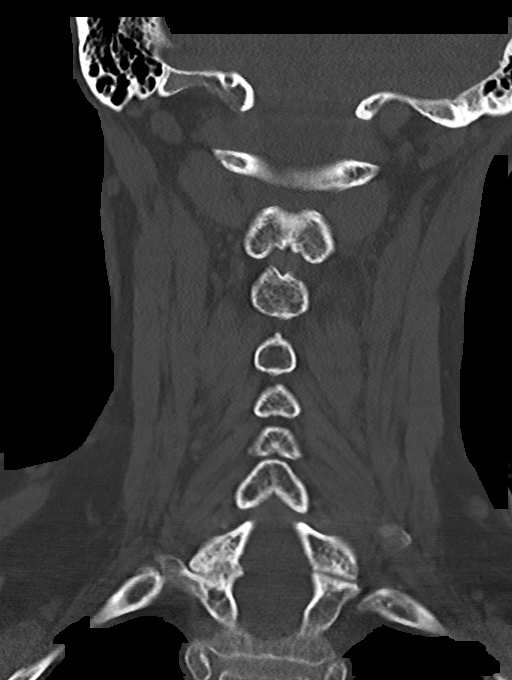

[Series 14: c_spine 2.0 orthogonals · axial · 0.21mm/px · z∈[-220,-151]mm · 2 of 87 slices shown, 3 images]
[im 29/87  soft-tissue]
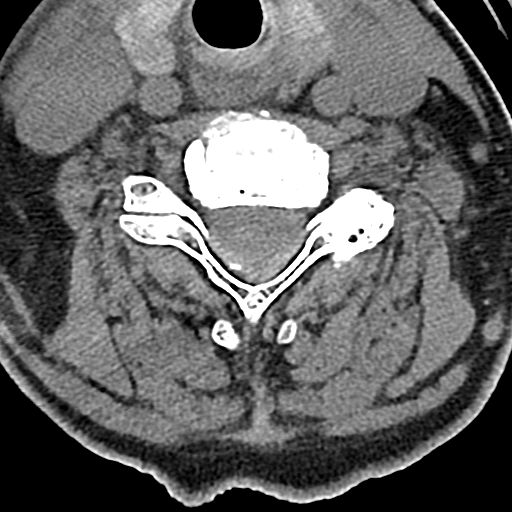
[im 29/87  bone]
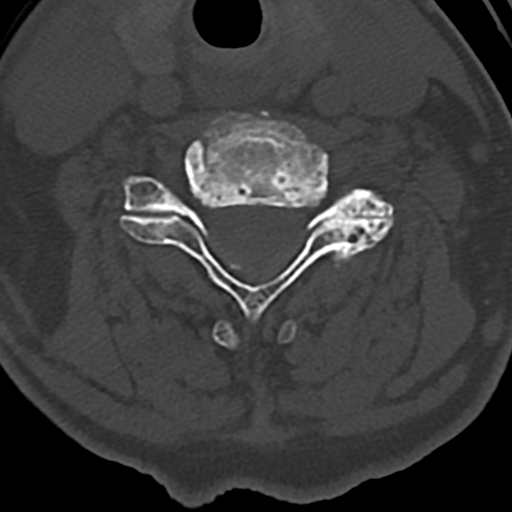
[im 58/87  bone]
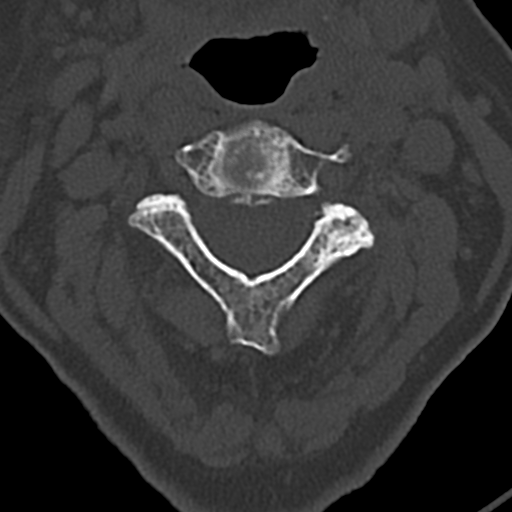

[12 of 33 positions shown; findings below may reference images not displayed]

FINDINGS: CT HEAD FINDINGS

Brain: No acute territorial infarction, hemorrhage or intracranial
mass is visualized. Mild to moderate atrophy. Minimal small vessel
ischemic changes of the white matter. Stable ventricle size.

Vascular: No hyperdense vessels. Minimal carotid vascular
calcification.

Skull: No fracture

Sinuses/Orbits: No acute finding.

Other: Small left posterior parietal scalp hematoma

CT CERVICAL SPINE FINDINGS

Alignment: No subluxation.  Facet alignment within normal limits.

Skull base and vertebrae: No acute fracture. No primary bone lesion
or focal pathologic process.

Soft tissues and spinal canal: No prevertebral fluid or swelling. No
visible canal hematoma.

Disc levels: Mild degenerative changes at C5-C6, moderate
degenerative changes at C6-C7.

Upper chest: Lung apices are clear. Multiple hypodense thyroid
nodules measuring up to 13 mm.

Other: None
IMPRESSION: 1. No CT evidence for acute intracranial abnormality. Atrophy with
minimal small vessel ischemic changes of the white matter. Small
left posterior parietal scalp hematoma
2. No acute osseous abnormality of the cervical spine
3. Multiple hypodense thyroid nodules.

## 2019-05-17 DIAGNOSIS — K219 Gastro-esophageal reflux disease without esophagitis: Secondary | ICD-10-CM | POA: Diagnosis not present

## 2019-05-17 DIAGNOSIS — K5909 Other constipation: Secondary | ICD-10-CM | POA: Diagnosis not present

## 2019-05-18 ENCOUNTER — Ambulatory Visit: Payer: Medicare Other | Admitting: Gastroenterology

## 2019-05-18 DIAGNOSIS — R2681 Unsteadiness on feet: Secondary | ICD-10-CM | POA: Diagnosis not present

## 2019-05-18 DIAGNOSIS — R0789 Other chest pain: Secondary | ICD-10-CM | POA: Diagnosis not present

## 2019-05-18 DIAGNOSIS — R1030 Lower abdominal pain, unspecified: Secondary | ICD-10-CM | POA: Diagnosis not present

## 2019-05-18 DIAGNOSIS — R296 Repeated falls: Secondary | ICD-10-CM | POA: Diagnosis not present

## 2019-05-18 DIAGNOSIS — E871 Hypo-osmolality and hyponatremia: Secondary | ICD-10-CM | POA: Diagnosis not present

## 2019-05-18 DIAGNOSIS — M25512 Pain in left shoulder: Secondary | ICD-10-CM | POA: Diagnosis not present

## 2019-05-24 DIAGNOSIS — R0789 Other chest pain: Secondary | ICD-10-CM | POA: Diagnosis not present

## 2019-05-24 DIAGNOSIS — R1030 Lower abdominal pain, unspecified: Secondary | ICD-10-CM | POA: Diagnosis not present

## 2019-05-24 DIAGNOSIS — E871 Hypo-osmolality and hyponatremia: Secondary | ICD-10-CM | POA: Diagnosis not present

## 2019-05-24 DIAGNOSIS — R2681 Unsteadiness on feet: Secondary | ICD-10-CM | POA: Diagnosis not present

## 2019-05-24 DIAGNOSIS — R296 Repeated falls: Secondary | ICD-10-CM | POA: Diagnosis not present

## 2019-05-24 DIAGNOSIS — M25512 Pain in left shoulder: Secondary | ICD-10-CM | POA: Diagnosis not present

## 2019-05-25 ENCOUNTER — Telehealth: Payer: Self-pay | Admitting: Family Medicine

## 2019-05-27 NOTE — Telephone Encounter (Signed)
Na- no call back - this encounter will be closed.

## 2019-05-30 DIAGNOSIS — H35372 Puckering of macula, left eye: Secondary | ICD-10-CM | POA: Diagnosis not present

## 2019-05-30 DIAGNOSIS — R296 Repeated falls: Secondary | ICD-10-CM | POA: Diagnosis not present

## 2019-05-30 DIAGNOSIS — M1990 Unspecified osteoarthritis, unspecified site: Secondary | ICD-10-CM | POA: Diagnosis not present

## 2019-05-30 DIAGNOSIS — H269 Unspecified cataract: Secondary | ICD-10-CM | POA: Diagnosis not present

## 2019-05-30 DIAGNOSIS — Z7982 Long term (current) use of aspirin: Secondary | ICD-10-CM | POA: Diagnosis not present

## 2019-05-30 DIAGNOSIS — R1030 Lower abdominal pain, unspecified: Secondary | ICD-10-CM | POA: Diagnosis not present

## 2019-05-30 DIAGNOSIS — Z7984 Long term (current) use of oral hypoglycemic drugs: Secondary | ICD-10-CM | POA: Diagnosis not present

## 2019-05-30 DIAGNOSIS — I10 Essential (primary) hypertension: Secondary | ICD-10-CM | POA: Diagnosis not present

## 2019-05-30 DIAGNOSIS — E119 Type 2 diabetes mellitus without complications: Secondary | ICD-10-CM | POA: Diagnosis not present

## 2019-05-30 DIAGNOSIS — R519 Headache, unspecified: Secondary | ICD-10-CM | POA: Diagnosis not present

## 2019-05-30 DIAGNOSIS — E785 Hyperlipidemia, unspecified: Secondary | ICD-10-CM | POA: Diagnosis not present

## 2019-05-30 DIAGNOSIS — H9312 Tinnitus, left ear: Secondary | ICD-10-CM | POA: Diagnosis not present

## 2019-05-30 DIAGNOSIS — R2681 Unsteadiness on feet: Secondary | ICD-10-CM | POA: Diagnosis not present

## 2019-05-30 DIAGNOSIS — M810 Age-related osteoporosis without current pathological fracture: Secondary | ICD-10-CM | POA: Diagnosis not present

## 2019-05-30 DIAGNOSIS — R0789 Other chest pain: Secondary | ICD-10-CM | POA: Diagnosis not present

## 2019-05-30 DIAGNOSIS — E871 Hypo-osmolality and hyponatremia: Secondary | ICD-10-CM | POA: Diagnosis not present

## 2019-05-30 DIAGNOSIS — K219 Gastro-esophageal reflux disease without esophagitis: Secondary | ICD-10-CM | POA: Diagnosis not present

## 2019-05-30 DIAGNOSIS — M25512 Pain in left shoulder: Secondary | ICD-10-CM | POA: Diagnosis not present

## 2019-05-30 DIAGNOSIS — I77810 Thoracic aortic ectasia: Secondary | ICD-10-CM | POA: Diagnosis not present

## 2019-06-07 DIAGNOSIS — E871 Hypo-osmolality and hyponatremia: Secondary | ICD-10-CM | POA: Diagnosis not present

## 2019-06-07 DIAGNOSIS — R0789 Other chest pain: Secondary | ICD-10-CM | POA: Diagnosis not present

## 2019-06-07 DIAGNOSIS — M25512 Pain in left shoulder: Secondary | ICD-10-CM | POA: Diagnosis not present

## 2019-06-07 DIAGNOSIS — R1030 Lower abdominal pain, unspecified: Secondary | ICD-10-CM | POA: Diagnosis not present

## 2019-06-07 DIAGNOSIS — R2681 Unsteadiness on feet: Secondary | ICD-10-CM | POA: Diagnosis not present

## 2019-06-07 DIAGNOSIS — R296 Repeated falls: Secondary | ICD-10-CM | POA: Diagnosis not present

## 2019-06-16 ENCOUNTER — Other Ambulatory Visit: Payer: Self-pay

## 2019-06-17 ENCOUNTER — Ambulatory Visit (INDEPENDENT_AMBULATORY_CARE_PROVIDER_SITE_OTHER): Payer: Medicare Other | Admitting: Physician Assistant

## 2019-06-17 ENCOUNTER — Encounter: Payer: Self-pay | Admitting: Physician Assistant

## 2019-06-17 VITALS — BP 180/88 | HR 77 | Temp 97.2°F | Ht 65.0 in | Wt 155.0 lb

## 2019-06-17 DIAGNOSIS — R300 Dysuria: Secondary | ICD-10-CM | POA: Diagnosis not present

## 2019-06-17 DIAGNOSIS — N3000 Acute cystitis without hematuria: Secondary | ICD-10-CM

## 2019-06-17 DIAGNOSIS — R109 Unspecified abdominal pain: Secondary | ICD-10-CM | POA: Diagnosis not present

## 2019-06-17 DIAGNOSIS — L03116 Cellulitis of left lower limb: Secondary | ICD-10-CM

## 2019-06-17 LAB — URINALYSIS, COMPLETE
Bilirubin, UA: NEGATIVE
Glucose, UA: NEGATIVE
Ketones, UA: NEGATIVE
Nitrite, UA: NEGATIVE
Protein,UA: NEGATIVE
Specific Gravity, UA: 1.02 (ref 1.005–1.030)
Urobilinogen, Ur: 0.2 mg/dL (ref 0.2–1.0)
pH, UA: 7.5 (ref 5.0–7.5)

## 2019-06-17 LAB — MICROSCOPIC EXAMINATION

## 2019-06-17 MED ORDER — CEPHALEXIN 500 MG PO CAPS: 500.0000 mg | ORAL_CAPSULE | Freq: Two times a day (BID) | ORAL | 0 refills | Status: DC

## 2019-06-17 NOTE — Progress Notes (Signed)
.   BP (!) 180/88   Pulse 77   Temp (!) 97.2 F (36.2 C) (Temporal)   Ht 5\' 5"  (1.651 m)   Wt 155 lb (70.3 kg)   SpO2 99%   BMI 25.79 kg/m    Subjective:    Patient ID: Debra Phillips, female    DOB: May 30, 1929, 85 y.o.   MRN: [id number]  HPI: Debra Phillips is a 85 y.o. female presenting on 06/17/2019 for Urinary Tract Infection  For about 1 week patient has had increasing lower abdominal pain with dysuria.  She states she has a hard time holding her water this is going on.  She denies any fever or chills.  She has not seen any blood.  She also has a flareup of her left lower leg cellulitis.  She has gotten these in the past.  There is some redness and warmth to the area.  She denies any fever.  The patient does have compression garments at home.  Past Medical History:  Diagnosis Date  . Abnormality of gait 01/29/2016  . Aortic root dilatation (HCC)    Mild  . Cataract   . Diabetes mellitus without complication ([place])    type 2  . Dyslipidemia   . Dyspnea    with exertion  . Dysrhythmia    hx of occ palpitations  . Esophageal stricture    s/p dilation  . GERD (gastroesophageal reflux disease)   . Headache   . History of hiatal hernia   . Hyperlipidemia   . Hypertension   . Macular pucker, left eye   . Osteoarthritis   . Osteoporosis   . Tinnitus    left ear last week or so   Relevant past medical, surgical, family and social history reviewed and updated as indicated. Interim medical history since our last visit reviewed. Allergies and medications reviewed and updated. DATA REVIEWED: CHART IN EPIC  Family History reviewed for pertinent findings.  Review of Systems  Constitutional: Negative.   HENT: Negative.   Eyes: Negative.   Respiratory: Negative.   Cardiovascular: Positive for leg swelling. Negative for chest pain and palpitations.  Gastrointestinal: Negative.   Genitourinary: Positive for difficulty urinating, dysuria and urgency. Negative for flank  pain.  Skin: Positive for color change.    Allergies as of 06/17/2019      Reactions   Codeine Nausea And Vomiting   Doxycycline Hives   Other Nausea Only   Almost all antibiotics   Sulfonamide Derivatives Hives   Vancomycin Itching      Medication List       Accurate as of June 17, 2019  9:24 AM. If you have any questions, ask your nurse or doctor.        STOP taking these medications   ciprofloxacin 500 MG tablet Commonly known as: Cipro Stopped by: Terald Sleeper, PA-C     TAKE these medications   aspirin EC 81 MG tablet Take 81 mg by mouth daily.   Calcium Carb-Cholecalciferol 600-500 MG-UNIT Caps Take 1 capsule by mouth every morning.   cephALEXin 500 MG capsule Commonly known as: Keflex Take 1 capsule (500 mg total) by mouth 2 (two) times daily. Started by: Terald Sleeper, PA-C   Cholecalciferol 25 MCG (1000 UT) tablet Take 1,000 Units by mouth daily.   diltiazem 180 MG 24 hr capsule Commonly known as: CARDIZEM CD TAKE (1) CAPSULE DAILY   ESTRACE VAGINAL 0.1 MG/GM vaginal cream Generic drug: estradiol Place 1 Applicatorful vaginally once  a week. What changed:   when to take this  reasons to take this   furosemide 20 MG tablet Commonly known as: LASIX Take 0.5 tablets (10 mg total) by mouth daily.   hydroxypropyl methylcellulose / hypromellose 2.5 % ophthalmic solution Commonly known as: ISOPTO TEARS / GONIOVISC Place 1 drop into both eyes 3 (three) times daily as needed for dry eyes.   lisinopril 40 MG tablet Commonly known as: ZESTRIL Take 1 tablet (40 mg total) by mouth daily.   metFORMIN 1000 MG tablet Commonly known as: GLUCOPHAGE Take 1 tablet (1,000 mg total) by mouth 2 (two) times daily with a meal.   mirabegron ER 25 MG Tb24 tablet Commonly known as: Myrbetriq Take 1 tablet (25 mg total) by mouth daily.   mirtazapine 7.5 MG tablet Commonly known as: REMERON Take 1 tablet (7.5 mg total) by mouth at bedtime.   nitroGLYCERIN  0.4 MG SL tablet Commonly known as: NITROSTAT Place 1 tablet (0.4 mg total) under the tongue every 5 (five) minutes as needed for chest pain.   omeprazole 40 MG capsule Commonly known as: PRILOSEC Take 1 capsule (40 mg total) by mouth 2 (two) times daily.   ONE TOUCH ULTRA TEST test strip Generic drug: glucose blood CHECK BLOOD SUGAR ONCE A DAY   onetouch ultrasoft lancets CHECK BLOOD SUGAR ONCE A DAY   pravastatin 40 MG tablet Commonly known as: PRAVACHOL Take 1 tablet (40 mg total) by mouth daily.          Objective:    BP (!) 180/88   Pulse 77   Temp (!) 97.2 F (36.2 C) (Temporal)   Ht 5\' 5"  (1.651 m)   Wt 155 lb (70.3 kg)   SpO2 99%   BMI 25.79 kg/m   Allergies  Allergen Reactions  . Codeine Nausea And Vomiting  . Doxycycline Hives  . Other Nausea Only    Almost all antibiotics  . Sulfonamide Derivatives Hives  . Vancomycin Itching    Wt Readings from Last 3 Encounters:  06/17/19 155 lb (70.3 kg)  04/26/19 155 lb (70.3 kg)  04/19/19 157 lb (71.2 kg)    Physical Exam Constitutional:      Appearance: She is well-developed.  HENT:     Head: Normocephalic and atraumatic.  Eyes:     Conjunctiva/sclera: Conjunctivae normal.     Pupils: Pupils are equal, round, and reactive to light.  Cardiovascular:     Rate and Rhythm: Normal rate and regular rhythm.     Heart sounds: Normal heart sounds.  Pulmonary:     Effort: Pulmonary effort is normal.     Breath sounds: Normal breath sounds.  Abdominal:     General: Bowel sounds are normal. There is no distension.     Palpations: Abdomen is soft. There is no mass.     Tenderness: There is abdominal tenderness in the suprapubic area. There is no guarding or rebound.  Skin:    General: Skin is warm and dry.     Findings: Erythema present. No rash.          Comments: Minimal swelling  Neurological:     Mental Status: She is alert and oriented to person, place, and time.     Deep Tendon Reflexes: Reflexes  are normal and symmetric.  Psychiatric:        Behavior: Behavior normal.        Thought Content: Thought content normal.        Judgment: Judgment normal.  Results for orders placed or performed in visit on 04/26/19  Microscopic Examination   URINE  Result Value Ref Range   WBC, UA 0-5 0 - 5 /hpf   RBC None seen 0 - 2 /hpf   Epithelial Cells (non renal) >10 (A) 0 - 10 /hpf   Renal Epithel, UA None seen None seen /hpf   Bacteria, UA Few (A) None seen/Few  Urinalysis, Complete  Result Value Ref Range   Specific Gravity, UA 1.015 1.005 - 1.030   pH, UA 7.0 5.0 - 7.5   Color, UA Yellow Yellow   Appearance Ur Clear Clear   Leukocytes,UA Negative Negative   Protein,UA Negative Negative/Trace   Glucose, UA Negative Negative   Ketones, UA Negative Negative   RBC, UA Negative Negative   Bilirubin, UA Negative Negative   Urobilinogen, Ur 0.2 0.2 - 1.0 mg/dL   Nitrite, UA Negative Negative   Microscopic Examination See below:   Basic Metabolic Panel  Result Value Ref Range   Glucose 160 (H) 65 - 99 mg/dL   BUN 7 (L) 8 - 27 mg/dL   Creatinine, Ser 0.66 0.57 - 1.00 mg/dL   GFR calc non Af Amer 79 >59 mL/min/1.73   GFR calc Af Amer 91 >59 mL/min/1.73   BUN/Creatinine Ratio 11 (L) 12 - 28   Sodium 136 134 - 144 mmol/L   Potassium 4.2 3.5 - 5.2 mmol/L   Chloride 98 96 - 106 mmol/L   CO2 23 20 - 29 mmol/L   Calcium 9.2 8.7 - 10.3 mg/dL      Assessment & Plan:   1. Dysuria - Urinalysis, Complete - Urine Culture  2. Abdominal pain, unspecified abdominal location - Urinalysis, Complete - Urine Culture  3. Acute cystitis without hematuria - cephALEXin (KEFLEX) 500 MG capsule; Take 1 capsule (500 mg total) by mouth 2 (two) times daily.  Dispense: 14 capsule; Refill: 0  4. Left leg cellulitis - cephALEXin (KEFLEX) 500 MG capsule; Take 1 capsule (500 mg total) by mouth 2 (two) times daily.  Dispense: 14 capsule; Refill: 0   Continue all other maintenance medications as  listed above.  Follow up plan: No follow-ups on file.  Educational handout given for uti  Terald Sleeper PA-C [facility] [address]  [place], [place] [address] [phone]   06/17/2019, 9:24 AM

## 2019-06-17 NOTE — Patient Instructions (Signed)
Urinary Tract Infection, Adult A urinary tract infection (UTI) is an infection of any part of the urinary tract. The urinary tract includes:  The kidneys.  The ureters.  The bladder.  The urethra. These organs make, store, and get rid of pee (urine) in the body. What are the causes? This is caused by germs (bacteria) in your genital area. These germs grow and cause swelling (inflammation) of your urinary tract. What increases the risk? You are more likely to develop this condition if:  You have a small, thin tube (catheter) to drain pee.  You cannot control when you pee or poop (incontinence).  You are female, and: ? You use these methods to prevent pregnancy: ? A medicine that kills sperm (spermicide). ? A device that blocks sperm (diaphragm). ? You have low levels of a female hormone (estrogen). ? You are pregnant.  You have genes that add to your risk.  You are sexually active.  You take antibiotic medicines.  You have trouble peeing because of: ? A prostate that is bigger than normal, if you are female. ? A blockage in the part of your body that drains pee from the bladder (urethra). ? A kidney stone. ? A nerve condition that affects your bladder (neurogenic bladder). ? Not getting enough to drink. ? Not peeing often enough.  You have other conditions, such as: ? Diabetes. ? A weak disease-fighting system (immune system). ? Sickle cell disease. ? Gout. ? Injury of the spine. What are the signs or symptoms? Symptoms of this condition include:  Needing to pee right away (urgently).  Peeing often.  Peeing small amounts often.  Pain or burning when peeing.  Blood in the pee.  Pee that smells bad or not like normal.  Trouble peeing.  Pee that is cloudy.  Fluid coming from the vagina, if you are female.  Pain in the belly or lower back. Other symptoms include:  Throwing up (vomiting).  No urge to eat.  Feeling mixed up (confused).  Being tired  and grouchy (irritable).  A fever.  Watery poop (diarrhea). How is this treated? This condition may be treated with:  Antibiotic medicine.  Other medicines.  Drinking enough water. Follow these instructions at home:  Medicines  Take over-the-counter and prescription medicines only as told by your doctor.  If you were prescribed an antibiotic medicine, take it as told by your doctor. Do not stop taking it even if you start to feel better. General instructions  Make sure you: ? Pee until your bladder is empty. ? Do not hold pee for a long time. ? Empty your bladder after sex. ? Wipe from front to back after pooping if you are a female. Use each tissue one time when you wipe.  Drink enough fluid to keep your pee pale yellow.  Keep all follow-up visits as told by your doctor. This is important. Contact a doctor if:  You do not get better after 1-2 days.  Your symptoms go away and then come back. Get help right away if:  You have very bad back pain.  You have very bad pain in your lower belly.  You have a fever.  You are sick to your stomach (nauseous).  You are throwing up. Summary  A urinary tract infection (UTI) is an infection of any part of the urinary tract.  This condition is caused by germs in your genital area.  There are many risk factors for a UTI. These include having a small, thin   tube to drain pee and not being able to control when you pee or poop.  Treatment includes antibiotic medicines for germs.  Drink enough fluid to keep your pee pale yellow. This information is not intended to replace advice given to you by your health care provider. Make sure you discuss any questions you have with your health care provider. Document Released: 01/28/2008 Document Revised: 07/29/2018 Document Reviewed: 02/18/2018 Elsevier Patient Education  2020 [employer]  

## 2019-06-19 LAB — URINE CULTURE

## 2019-06-21 DIAGNOSIS — R1030 Lower abdominal pain, unspecified: Secondary | ICD-10-CM | POA: Diagnosis not present

## 2019-06-21 DIAGNOSIS — R2681 Unsteadiness on feet: Secondary | ICD-10-CM | POA: Diagnosis not present

## 2019-06-21 DIAGNOSIS — E871 Hypo-osmolality and hyponatremia: Secondary | ICD-10-CM | POA: Diagnosis not present

## 2019-06-21 DIAGNOSIS — R0789 Other chest pain: Secondary | ICD-10-CM | POA: Diagnosis not present

## 2019-06-21 DIAGNOSIS — R296 Repeated falls: Secondary | ICD-10-CM | POA: Diagnosis not present

## 2019-06-21 DIAGNOSIS — M25512 Pain in left shoulder: Secondary | ICD-10-CM | POA: Diagnosis not present

## 2019-07-04 ENCOUNTER — Ambulatory Visit (INDEPENDENT_AMBULATORY_CARE_PROVIDER_SITE_OTHER): Payer: Medicare Other | Admitting: Family Medicine

## 2019-07-04 ENCOUNTER — Encounter: Payer: Self-pay | Admitting: Family Medicine

## 2019-07-04 ENCOUNTER — Other Ambulatory Visit: Payer: Self-pay

## 2019-07-04 VITALS — BP 123/77 | HR 79 | Temp 98.2°F | Ht 65.0 in | Wt 158.6 lb

## 2019-07-04 DIAGNOSIS — L03119 Cellulitis of unspecified part of limb: Secondary | ICD-10-CM

## 2019-07-04 DIAGNOSIS — R109 Unspecified abdominal pain: Secondary | ICD-10-CM | POA: Diagnosis not present

## 2019-07-04 DIAGNOSIS — L02419 Cutaneous abscess of limb, unspecified: Secondary | ICD-10-CM

## 2019-07-04 LAB — MICROSCOPIC EXAMINATION
RBC, Urine: NONE SEEN /hpf (ref 0–2)
Renal Epithel, UA: NONE SEEN /hpf

## 2019-07-04 LAB — URINALYSIS, COMPLETE
Bilirubin, UA: NEGATIVE
Glucose, UA: NEGATIVE
Ketones, UA: NEGATIVE
Leukocytes,UA: NEGATIVE
Nitrite, UA: NEGATIVE
Protein,UA: NEGATIVE
RBC, UA: NEGATIVE
Specific Gravity, UA: <1.005 — ABNORMAL LOW (ref 1.005–1.030)
Urobilinogen, Ur: 0.2 mg/dL (ref 0.2–1.0)
pH, UA: 6 (ref 5.0–7.5)

## 2019-07-04 MED ORDER — CIPROFLOXACIN HCL 250 MG PO TABS: 250.0000 mg | ORAL_TABLET | Freq: Two times a day (BID) | ORAL | 0 refills | Status: DC

## 2019-07-04 NOTE — Progress Notes (Signed)
Subjective:  Patient ID: Debra Phillips, female    DOB: 11/13/1928  Age: 85 y.o. MRN: [id number]  CC: Abdominal Pain (Patient was seen 10/23 and states it never got better) and Urinary Frequency   HPI Debra Phillips presents for burning with urination and frequency for several days. Denies fever . No flank pain. No nausea, vomiting.Also has redness at left shin "cellulitis," she states.   Depression screen [facility] 2/9 07/04/2019 06/17/2019 04/26/2019  Decreased Interest 0 1 1  Down, Depressed, Hopeless 1 1 1   PHQ - 2 Score 1 2 2   Altered sleeping - 1 1  Tired, decreased energy - 2 2  Change in appetite - 1 1  Feeling bad or failure about yourself  - 1 1  Trouble concentrating - 1 1  Moving slowly or fidgety/restless - 1 1  Suicidal thoughts - 0 0  PHQ-9 Score - 9 9  Difficult doing work/chores - - Somewhat difficult  Some recent data might be hidden    History Sonnie has a past medical history of Abnormality of gait (01/29/2016), Aortic root dilatation (HCC), Cataract, Diabetes mellitus without complication ([place]), Dyslipidemia, Dyspnea, Dysrhythmia, Esophageal stricture, GERD (gastroesophageal reflux disease), Headache, History of hiatal hernia, Hyperlipidemia, Hypertension, Macular pucker, left eye, Osteoarthritis, Osteoporosis, and Tinnitus.   She has a past surgical history that includes Total knee arthroplasty; Shoulder surgery; Knee arthroscopy (2004); Lumbar fusion (2000,2003); Radial head arthroplasty (5/12); Appendectomy; Cholecystectomy (1993); Cardiac catheterization (2010); Fracture surgery; Kyphosis surgery; Hardware Removal (12/10/2011); Eye surgery; Eye surgery; Back surgery; Abdominal hysterectomy; Vaginal prolapse repair (N/A, 08/28/2016); and Cystoscopy (N/A, 08/28/2016).   Her family history includes Arthritis in her sister; Brain cancer in her father; COPD in her brother and sister; Diabetes in an other family member; Emphysema in her brother; Heart disease in her brother;  Liver cancer in her brother; Lung cancer in her brother; Pneumonia in her mother; Tremor in her brother and father.She reports that she has never smoked. She has never used smokeless tobacco. She reports that she does not drink alcohol or use drugs.    ROS Review of Systems  Constitutional: Negative for chills, diaphoresis and fever.  HENT: Negative for congestion.   Eyes: Negative for visual disturbance.  Respiratory: Negative for cough and shortness of breath.   Cardiovascular: Positive for leg swelling. Negative for chest pain and palpitations.  Gastrointestinal: Negative for constipation, diarrhea and nausea.  Genitourinary: Positive for dysuria, frequency and urgency. Negative for decreased urine volume, flank pain, hematuria, menstrual problem and pelvic pain.  Musculoskeletal: Negative for arthralgias and joint swelling.  Skin: Negative for rash.  Neurological: Negative for dizziness and numbness.    Objective:  BP 123/77   Pulse 79   Temp 98.2 F (36.8 C) (Temporal)   Ht 5\' 5"  (1.651 m)   Wt 158 lb 9.6 oz (71.9 kg)   SpO2 99%   BMI 26.39 kg/m   BP Readings from Last 3 Encounters:  07/04/19 123/77  06/17/19 (!) 180/88  04/26/19 (!) 148/80    Wt Readings from Last 3 Encounters:  07/04/19 158 lb 9.6 oz (71.9 kg)  06/17/19 155 lb (70.3 kg)  04/26/19 155 lb (70.3 kg)     Physical Exam Constitutional:      Appearance: She is well-developed.  HENT:     Head: Normocephalic and atraumatic.  Cardiovascular:     Rate and Rhythm: Normal rate and regular rhythm.     Heart sounds: No murmur.  Pulmonary:  Effort: Pulmonary effort is normal.     Breath sounds: Normal breath sounds.  Abdominal:     General: Bowel sounds are normal.     Palpations: Abdomen is soft. There is no mass.     Tenderness: There is no abdominal tenderness. There is no guarding or rebound.  Musculoskeletal:        General: No tenderness.  Skin:    General: Skin is warm and dry.      Findings: Erythema (left shin) present.  Neurological:     Mental Status: She is alert and oriented to person, place, and time.  Psychiatric:        Behavior: Behavior normal.       Assessment & Plan:   Debra Phillips was seen today for abdominal pain and urinary frequency.  Diagnoses and all orders for this visit:  Abdominal pain, unspecified abdominal location -     urinalysis- dip and micro       I have discontinued Debra Phillips's cephALEXin. I am also having her maintain her Cholecalciferol, Calcium Carb-Cholecalciferol, nitroGLYCERIN, aspirin EC, hydroxypropyl methylcellulose / hypromellose, ONE TOUCH ULTRA TEST, onetouch ultrasoft, metFORMIN, ESTRACE VAGINAL, omeprazole, mirtazapine, lisinopril, pravastatin, furosemide, mirabegron ER, and diltiazem.  Allergies as of 07/04/2019      Reactions   Codeine Nausea And Vomiting   Doxycycline Hives   Other Nausea Only   Almost all antibiotics   Sulfonamide Derivatives Hives   Vancomycin Itching      Medication List       Accurate as of July 04, 2019  2:31 PM. If you have any questions, ask your nurse or doctor.        STOP taking these medications   cephALEXin 500 MG capsule Commonly known as: Keflex Stopped by: Claretta Fraise, MD     TAKE these medications   aspirin EC 81 MG tablet Take 81 mg by mouth daily.   Calcium Carb-Cholecalciferol 600-500 MG-UNIT Caps Take 1 capsule by mouth every morning.   Cholecalciferol 25 MCG (1000 UT) tablet Take 1,000 Units by mouth daily.   diltiazem 180 MG 24 hr capsule Commonly known as: CARDIZEM CD TAKE (1) CAPSULE DAILY   ESTRACE VAGINAL 0.1 MG/GM vaginal cream Generic drug: estradiol Place 1 Applicatorful vaginally once a week. What changed:   when to take this  reasons to take this   furosemide 20 MG tablet Commonly known as: LASIX Take 0.5 tablets (10 mg total) by mouth daily.   hydroxypropyl methylcellulose / hypromellose 2.5 % ophthalmic solution  Commonly known as: ISOPTO TEARS / GONIOVISC Place 1 drop into both eyes 3 (three) times daily as needed for dry eyes.   lisinopril 40 MG tablet Commonly known as: ZESTRIL Take 1 tablet (40 mg total) by mouth daily.   metFORMIN 1000 MG tablet Commonly known as: GLUCOPHAGE Take 1 tablet (1,000 mg total) by mouth 2 (two) times daily with a meal.   mirabegron ER 25 MG Tb24 tablet Commonly known as: Myrbetriq Take 1 tablet (25 mg total) by mouth daily.   mirtazapine 7.5 MG tablet Commonly known as: REMERON Take 1 tablet (7.5 mg total) by mouth at bedtime.   nitroGLYCERIN 0.4 MG SL tablet Commonly known as: NITROSTAT Place 1 tablet (0.4 mg total) under the tongue every 5 (five) minutes as needed for chest pain.   omeprazole 40 MG capsule Commonly known as: PRILOSEC Take 1 capsule (40 mg total) by mouth 2 (two) times daily.   ONE TOUCH ULTRA TEST test strip Generic drug:  glucose blood CHECK BLOOD SUGAR ONCE A DAY   onetouch ultrasoft lancets CHECK BLOOD SUGAR ONCE A DAY   pravastatin 40 MG tablet Commonly known as: PRAVACHOL Take 1 tablet (40 mg total) by mouth daily.        Follow-up: No follow-ups on file.  Claretta Fraise, M.D.

## 2019-07-15 ENCOUNTER — Other Ambulatory Visit: Payer: Self-pay | Admitting: Family Medicine

## 2019-07-18 ENCOUNTER — Other Ambulatory Visit: Payer: Self-pay | Admitting: Family Medicine

## 2019-07-19 ENCOUNTER — Other Ambulatory Visit: Payer: Self-pay

## 2019-07-20 ENCOUNTER — Ambulatory Visit (INDEPENDENT_AMBULATORY_CARE_PROVIDER_SITE_OTHER): Payer: Medicare Other

## 2019-07-20 DIAGNOSIS — Z23 Encounter for immunization: Secondary | ICD-10-CM

## 2019-08-01 ENCOUNTER — Other Ambulatory Visit: Payer: Self-pay | Admitting: Family Medicine

## 2019-08-13 ENCOUNTER — Other Ambulatory Visit: Payer: Self-pay | Admitting: Family Medicine

## 2019-08-17 ENCOUNTER — Other Ambulatory Visit: Payer: Self-pay

## 2019-08-17 ENCOUNTER — Encounter: Payer: Self-pay | Admitting: Family Medicine

## 2019-08-17 ENCOUNTER — Ambulatory Visit (INDEPENDENT_AMBULATORY_CARE_PROVIDER_SITE_OTHER): Payer: Medicare Other | Admitting: Family Medicine

## 2019-08-17 VITALS — BP 146/86 | HR 82 | Temp 96.9°F | Ht 65.0 in | Wt 160.6 lb

## 2019-08-17 DIAGNOSIS — R350 Frequency of micturition: Secondary | ICD-10-CM

## 2019-08-17 DIAGNOSIS — R1030 Lower abdominal pain, unspecified: Secondary | ICD-10-CM

## 2019-08-17 DIAGNOSIS — R600 Localized edema: Secondary | ICD-10-CM

## 2019-08-17 LAB — URINALYSIS, COMPLETE
Bilirubin, UA: NEGATIVE
Glucose, UA: NEGATIVE
Ketones, UA: NEGATIVE
Leukocytes,UA: NEGATIVE
Nitrite, UA: NEGATIVE
Protein,UA: NEGATIVE
RBC, UA: NEGATIVE
Specific Gravity, UA: 1.01 (ref 1.005–1.030)
Urobilinogen, Ur: 0.2 mg/dL (ref 0.2–1.0)
pH, UA: 6 (ref 5.0–7.5)

## 2019-08-17 LAB — MICROSCOPIC EXAMINATION
Bacteria, UA: NONE SEEN
RBC, Urine: NONE SEEN /hpf (ref 0–2)
Renal Epithel, UA: NONE SEEN /hpf
WBC, UA: NONE SEEN /hpf (ref 0–5)

## 2019-08-17 NOTE — Patient Instructions (Addendum)
Wear your compression stockings every day.  Elevate your legs when you are sitting down.  Restart your furosemide.    Edema  Edema is when you have too much fluid in your body or under your skin. Edema may make your legs, feet, and ankles swell up. Swelling is also common in looser tissues, like around your eyes. This is a common condition. It gets more common as you get older. There are many possible causes of edema. Eating too much salt (sodium) and being on your feet or sitting for a long time can cause edema in your legs, feet, and ankles. Hot weather may make edema worse. Edema is usually painless. Your skin may look swollen or shiny. Follow these instructions at home:  Keep the swollen body part raised (elevated) above the level of your heart when you are sitting or lying down.  Do not sit still or stand for a long time.  Do not wear tight clothes. Do not wear garters on your upper legs.  Exercise your legs. This can help the swelling go down.  Wear elastic bandages or support stockings as told by your doctor.  Eat a low-salt (low-sodium) diet to reduce fluid as told by your doctor.  Depending on the cause of your swelling, you may need to limit how much fluid you drink (fluid restriction).  Take over-the-counter and prescription medicines only as told by your doctor. Contact a doctor if:  Treatment is not working.  You have heart, liver, or kidney disease and have symptoms of edema.  You have sudden and unexplained weight gain. Get help right away if:  You have shortness of breath or chest pain.  You cannot breathe when you lie down.  You have pain, redness, or warmth in the swollen areas.  You have heart, liver, or kidney disease and get edema all of a sudden.  You have a fever and your symptoms get worse all of a sudden. Summary  Edema is when you have too much fluid in your body or under your skin.  Edema may make your legs, feet, and ankles swell up.  Swelling is also common in looser tissues, like around your eyes.  Raise (elevate) the swollen body part above the level of your heart when you are sitting or lying down.  Follow your doctor's instructions about diet and how much fluid you can drink (fluid restriction). This information is not intended to replace advice given to you by your health care provider. Make sure you discuss any questions you have with your health care provider. Document Released: 01/28/2008 Document Revised: 08/14/2017 Document Reviewed: 08/29/2016 Elsevier Patient Education  2020 [employer].

## 2019-08-17 NOTE — Progress Notes (Signed)
Assessment & Plan:  1. Bilateral lower extremity edema Education provided on edema.  Encourage patient to wear compression stockings every day and to elevate her legs when she is sitting down.  Advised to restart her furosemide.  2-3. Lower abdominal pain/Urinary frequency Advised patient to go home and look at her medications and make sure she is taking her Myrbetriq. - urinalysis- dip and micro: negative - Urine culture   Follow up plan: Return with PCP when she has an opening and patient is available.  Hendricks Limes, MSN, APRN, FNP-C [facility]  Subjective:   Patient ID: Debra Phillips, female    DOB: 03-19-1929, 85 y.o.   MRN: [id number]  HPI: Debra Phillips is a 85 y.o. female presenting on 08/17/2019 for legs and feet swelling  Patient reports he has been experiencing lower extremity swelling for the past 2 to 3 weeks.  She has not been taking her Lasix.  She does not wear compression stockings.  She does not elevate her legs when she is sitting.  She does report she does not eat a lot of salt.  She wonders if she has a urinary tract infection that is causing her swelling.  She reports she does have urinary frequency and that she sometimes has accidents.  She is unsure if she is taking her Myrbetriq.   ROS: Negative unless specifically indicated above in HPI.   Relevant past medical history reviewed and updated as indicated.   Allergies and medications reviewed and updated.   Current Outpatient Medications:  .  aspirin EC 81 MG tablet, Take 81 mg by mouth daily., Disp: , Rfl:  .  Calcium Carb-Cholecalciferol 600-500 MG-UNIT CAPS, Take 1 capsule by mouth every morning., Disp: , Rfl:  .  Cholecalciferol 1000 UNITS tablet, Take 1,000 Units by mouth daily. , Disp: , Rfl:  .  diltiazem (CARDIZEM CD) 180 MG 24 hr capsule, TAKE (1) CAPSULE DAILY, Disp: 90 capsule, Rfl: 0 .  ESTRACE VAGINAL 0.1 MG/GM vaginal cream, Place 1 Applicatorful vaginally  once a week. (Patient taking differently: Place 1 Applicatorful vaginally daily as needed. ), Disp: 42.5 g, Rfl: 1 .  furosemide (LASIX) 20 MG tablet, Take 0.5 tablets (10 mg total) by mouth daily., Disp: 30 tablet, Rfl: 3 .  hydroxypropyl methylcellulose / hypromellose (ISOPTO TEARS / GONIOVISC) 2.5 % ophthalmic solution, Place 1 drop into both eyes 3 (three) times daily as needed for dry eyes., Disp: , Rfl:  .  Lancets (ONETOUCH ULTRASOFT) lancets, CHECK BLOOD SUGAR ONCE A DAY, Disp: 50 each, Rfl: 9 .  lisinopril (ZESTRIL) 40 MG tablet, TAKE 1 TABLET DAILY, Disp: 90 tablet, Rfl: 0 .  metFORMIN (GLUCOPHAGE) 1000 MG tablet, Take 1 tablet (1,000 mg total) by mouth 2 (two) times daily with a meal., Disp: 180 tablet, Rfl: 3 .  mirabegron ER (MYRBETRIQ) 25 MG TB24 tablet, Take 1 tablet (25 mg total) by mouth daily., Disp: 90 tablet, Rfl: 0 .  mirtazapine (REMERON) 7.5 MG tablet, Take 1 tablet (7.5 mg total) by mouth at bedtime., Disp: 30 tablet, Rfl: 2 .  nitroGLYCERIN (NITROSTAT) 0.4 MG SL tablet, Place 1 tablet (0.4 mg total) under the tongue every 5 (five) minutes as needed for chest pain., Disp: 25 tablet, Rfl: 3 .  omeprazole (PRILOSEC) 40 MG capsule, Take 1 capsule (40 mg total) by mouth 2 (two) times daily., Disp: 180 capsule, Rfl: 1 .  ONETOUCH ULTRA test strip, CHECK BLOOD SUGAR ONCE A DAY, Disp: 50 strip, Rfl:  0 .  pravastatin (PRAVACHOL) 40 MG tablet, TAKE 1 TABLET DAILY, Disp: 90 tablet, Rfl: 0  Allergies  Allergen Reactions  . Codeine Nausea And Vomiting  . Doxycycline Hives  . Other Nausea Only    Almost all antibiotics  . Sulfonamide Derivatives Hives  . Vancomycin Itching    Objective:   BP (!) 146/86 Comment: manual  Pulse 82   Temp (!) 96.9 F (36.1 C) (Temporal)   Ht 5\' 5"  (1.651 m)   Wt 160 lb 9.6 oz (72.8 kg)   SpO2 100%   BMI 26.73 kg/m    Physical Exam Vitals reviewed.  Constitutional:      General: She is not in acute distress.    Appearance: Normal  appearance. She is overweight. She is not ill-appearing, toxic-appearing or diaphoretic.  HENT:     Head: Normocephalic and atraumatic.  Eyes:     General: No scleral icterus.       Right eye: No discharge.        Left eye: No discharge.     Conjunctiva/sclera: Conjunctivae normal.  Cardiovascular:     Rate and Rhythm: Normal rate and regular rhythm.     Heart sounds: Normal heart sounds. No murmur. No friction rub. No gallop.   Pulmonary:     Effort: Pulmonary effort is normal. No respiratory distress.     Breath sounds: Normal breath sounds. No stridor. No wheezing, rhonchi or rales.  Musculoskeletal:        General: Normal range of motion.     Cervical back: Normal range of motion.     Right lower leg: Edema (2+ pitting edema) present.     Left lower leg: Edema (2+ pitting edema) present.  Skin:    General: Skin is warm and dry.     Capillary Refill: Capillary refill takes less than 2 seconds.  Neurological:     General: No focal deficit present.     Mental Status: She is alert and oriented to person, place, and time. Mental status is at baseline.  Psychiatric:        Mood and Affect: Mood normal.        Behavior: Behavior normal.        Thought Content: Thought content normal.        Judgment: Judgment normal.

## 2019-08-18 LAB — URINE CULTURE: Organism ID, Bacteria: NO GROWTH

## 2019-09-15 ENCOUNTER — Other Ambulatory Visit: Payer: Self-pay

## 2019-09-15 ENCOUNTER — Ambulatory Visit (INDEPENDENT_AMBULATORY_CARE_PROVIDER_SITE_OTHER): Payer: Medicare Other | Admitting: Family Medicine

## 2019-09-15 ENCOUNTER — Encounter: Payer: Self-pay | Admitting: Family Medicine

## 2019-09-15 VITALS — BP 161/87 | HR 91 | Temp 97.7°F | Ht 64.0 in | Wt 159.0 lb

## 2019-09-15 DIAGNOSIS — N390 Urinary tract infection, site not specified: Secondary | ICD-10-CM

## 2019-09-15 DIAGNOSIS — R300 Dysuria: Secondary | ICD-10-CM | POA: Diagnosis not present

## 2019-09-15 DIAGNOSIS — N3020 Other chronic cystitis without hematuria: Secondary | ICD-10-CM

## 2019-09-15 LAB — URINALYSIS, COMPLETE
Bilirubin, UA: NEGATIVE
Glucose, UA: NEGATIVE
Ketones, UA: NEGATIVE
Nitrite, UA: NEGATIVE
Specific Gravity, UA: 1.015 (ref 1.005–1.030)
Urobilinogen, Ur: 0.2 mg/dL (ref 0.2–1.0)
pH, UA: 6 (ref 5.0–7.5)

## 2019-09-15 LAB — MICROSCOPIC EXAMINATION

## 2019-09-15 MED ORDER — CEFDINIR 300 MG PO CAPS: 300.0000 mg | ORAL_CAPSULE | Freq: Two times a day (BID) | ORAL | 0 refills | Status: AC

## 2019-09-15 NOTE — Patient Instructions (Signed)
Urinary Tract Infection, Adult A urinary tract infection (UTI) is an infection of any part of the urinary tract. The urinary tract includes:  The kidneys.  The ureters.  The bladder.  The urethra. These organs make, store, and get rid of pee (urine) in the body. What are the causes? This is caused by germs (bacteria) in your genital area. These germs grow and cause swelling (inflammation) of your urinary tract. What increases the risk? You are more likely to develop this condition if:  You have a small, thin tube (catheter) to drain pee.  You cannot control when you pee or poop (incontinence).  You are female, and: ? You use these methods to prevent pregnancy:  A medicine that kills sperm (spermicide).  A device that blocks sperm (diaphragm). ? You have low levels of a female hormone (estrogen). ? You are pregnant.  You have genes that add to your risk.  You are sexually active.  You take antibiotic medicines.  You have trouble peeing because of: ? A prostate that is bigger than normal, if you are female. ? A blockage in the part of your body that drains pee from the bladder (urethra). ? A kidney stone. ? A nerve condition that affects your bladder (neurogenic bladder). ? Not getting enough to drink. ? Not peeing often enough.  You have other conditions, such as: ? Diabetes. ? A weak disease-fighting system (immune system). ? Sickle cell disease. ? Gout. ? Injury of the spine. What are the signs or symptoms? Symptoms of this condition include:  Needing to pee right away (urgently).  Peeing often.  Peeing small amounts often.  Pain or burning when peeing.  Blood in the pee.  Pee that smells bad or not like normal.  Trouble peeing.  Pee that is cloudy.  Fluid coming from the vagina, if you are female.  Pain in the belly or lower back. Other symptoms include:  Throwing up (vomiting).  No urge to eat.  Feeling mixed up (confused).  Being tired  and grouchy (irritable).  A fever.  Watery poop (diarrhea). How is this treated? This condition may be treated with:  Antibiotic medicine.  Other medicines.  Drinking enough water. Follow these instructions at home:  Medicines  Take over-the-counter and prescription medicines only as told by your doctor.  If you were prescribed an antibiotic medicine, take it as told by your doctor. Do not stop taking it even if you start to feel better. General instructions  Make sure you: ? Pee until your bladder is empty. ? Do not hold pee for a long time. ? Empty your bladder after sex. ? Wipe from front to back after pooping if you are a female. Use each tissue one time when you wipe.  Drink enough fluid to keep your pee pale yellow.  Keep all follow-up visits as told by your doctor. This is important. Contact a doctor if:  You do not get better after 1-2 days.  Your symptoms go away and then come back. Get help right away if:  You have very bad back pain.  You have very bad pain in your lower belly.  You have a fever.  You are sick to your stomach (nauseous).  You are throwing up. Summary  A urinary tract infection (UTI) is an infection of any part of the urinary tract.  This condition is caused by germs in your genital area.  There are many risk factors for a UTI. These include having a small, thin   tube to drain pee and not being able to control when you pee or poop.  Treatment includes antibiotic medicines for germs.  Drink enough fluid to keep your pee pale yellow. This information is not intended to replace advice given to you by your health care provider. Make sure you discuss any questions you have with your health care provider. Document Revised: 07/29/2018 Document Reviewed: 02/18/2018 Elsevier Patient Education  2020 [employer] Inc[employer]  

## 2019-09-15 NOTE — Progress Notes (Signed)
Assessment & Plan:  1. Acute UTI - Education provided on UTIs.  Encouraged adequate hydration. - cefdinir (OMNICEF) 300 MG capsule; Take 1 capsule (300 mg total) by mouth 2 (two) times daily for 10 days. 1 po BID  Dispense: 20 capsule; Refill: 0  2. Dysuria - Urinalysis, Complete; Urine dipstick shows positive for RBC's, positive for protein and positive for leukocytes.  Micro exam: 11-30 WBC's per HPF, 3-10 RBC's per HPF and few bacteria.  3-4. Chronic cystitis/Recurrent UTI - Patient has had 5 visits for dysuria, suprapubic abdominal pain, and urinary frequency in the past five months. She has been treated with Cipro, Keflex, and now Cefdinir.  - Ambulatory referral to Urology   Follow up plan: Return as scheduled.  Hendricks Limes, MSN, APRN, FNP-C [facility]  Subjective:   Patient ID: Debra Phillips, female    DOB: 01/15/1929, 86 y.o.   MRN: [id number]  HPI: Debra Phillips is a 86 y.o. female presenting on 09/15/2019 for Back Pain and Dysuria  Urinary Tract Infection: Patient complains of dysuria, frequency, suprapubic pressure and urgency She has had symptoms for a few days. Patient also complains of back pain. Patient denies fever. Patient does have a history of recurrent UTI.  Patient does not have a history of pyelonephritis.    ROS: Negative unless specifically indicated above in HPI.   Relevant past medical history reviewed and updated as indicated.   Allergies and medications reviewed and updated.   Current Outpatient Medications:  .  aspirin EC 81 MG tablet, Take 81 mg by mouth daily., Disp: , Rfl:  .  Calcium Carb-Cholecalciferol 600-500 MG-UNIT CAPS, Take 1 capsule by mouth every morning., Disp: , Rfl:  .  Cholecalciferol 1000 UNITS tablet, Take 1,000 Units by mouth daily. , Disp: , Rfl:  .  diltiazem (CARDIZEM CD) 180 MG 24 hr capsule, TAKE (1) CAPSULE DAILY, Disp: 90 capsule, Rfl: 0 .  ESTRACE VAGINAL 0.1 MG/GM vaginal cream, Place  1 Applicatorful vaginally once a week. (Patient taking differently: Place 1 Applicatorful vaginally daily as needed. ), Disp: 42.5 g, Rfl: 1 .  furosemide (LASIX) 20 MG tablet, Take 0.5 tablets (10 mg total) by mouth daily., Disp: 30 tablet, Rfl: 3 .  hydroxypropyl methylcellulose / hypromellose (ISOPTO TEARS / GONIOVISC) 2.5 % ophthalmic solution, Place 1 drop into both eyes 3 (three) times daily as needed for dry eyes., Disp: , Rfl:  .  Lancets (ONETOUCH ULTRASOFT) lancets, CHECK BLOOD SUGAR ONCE A DAY, Disp: 50 each, Rfl: 9 .  lisinopril (ZESTRIL) 40 MG tablet, TAKE 1 TABLET DAILY, Disp: 90 tablet, Rfl: 0 .  metFORMIN (GLUCOPHAGE) 1000 MG tablet, Take 1 tablet (1,000 mg total) by mouth 2 (two) times daily with a meal., Disp: 180 tablet, Rfl: 3 .  mirabegron ER (MYRBETRIQ) 25 MG TB24 tablet, Take 1 tablet (25 mg total) by mouth daily., Disp: 90 tablet, Rfl: 0 .  mirtazapine (REMERON) 7.5 MG tablet, Take 1 tablet (7.5 mg total) by mouth at bedtime., Disp: 30 tablet, Rfl: 2 .  nitroGLYCERIN (NITROSTAT) 0.4 MG SL tablet, Place 1 tablet (0.4 mg total) under the tongue every 5 (five) minutes as needed for chest pain., Disp: 25 tablet, Rfl: 3 .  omeprazole (PRILOSEC) 40 MG capsule, Take 1 capsule (40 mg total) by mouth 2 (two) times daily., Disp: 180 capsule, Rfl: 1 .  ONETOUCH ULTRA test strip, CHECK BLOOD SUGAR ONCE A DAY, Disp: 50 strip, Rfl: 0 .  pravastatin (PRAVACHOL) 40 MG  tablet, TAKE 1 TABLET DAILY, Disp: 90 tablet, Rfl: 0 .  cefdinir (OMNICEF) 300 MG capsule, Take 1 capsule (300 mg total) by mouth 2 (two) times daily for 10 days. 1 po BID, Disp: 20 capsule, Rfl: 0  Allergies  Allergen Reactions  . Codeine Nausea And Vomiting  . Doxycycline Hives  . Other Nausea Only    Almost all antibiotics  . Sulfonamide Derivatives Hives  . Vancomycin Itching    Objective:   BP (!) 161/87   Pulse 91   Temp 97.7 F (36.5 C) (Temporal)   Ht 5\' 4"  (1.626 m)   Wt 159 lb (72.1 kg)   SpO2 100%    BMI 27.29 kg/m    Physical Exam Vitals reviewed.  Constitutional:      General: She is not in acute distress.    Appearance: Normal appearance. She is overweight. She is not ill-appearing, toxic-appearing or diaphoretic.  HENT:     Head: Normocephalic and atraumatic.  Eyes:     General: No scleral icterus.       Right eye: No discharge.        Left eye: No discharge.     Conjunctiva/sclera: Conjunctivae normal.  Cardiovascular:     Rate and Rhythm: Normal rate.  Pulmonary:     Effort: Pulmonary effort is normal. No respiratory distress.  Musculoskeletal:        General: Normal range of motion.     Cervical back: Normal range of motion.  Skin:    General: Skin is warm and dry.     Capillary Refill: Capillary refill takes less than 2 seconds.  Neurological:     General: No focal deficit present.     Mental Status: She is alert and oriented to person, place, and time. Mental status is at baseline.  Psychiatric:        Mood and Affect: Mood normal.        Behavior: Behavior normal.        Thought Content: Thought content normal.        Judgment: Judgment normal.

## 2019-09-19 ENCOUNTER — Encounter: Payer: Self-pay | Admitting: Family Medicine

## 2019-09-26 ENCOUNTER — Other Ambulatory Visit: Payer: Self-pay | Admitting: Family Medicine

## 2019-09-27 ENCOUNTER — Ambulatory Visit: Payer: Medicare Other | Admitting: Family Medicine

## 2019-10-06 ENCOUNTER — Other Ambulatory Visit: Payer: Self-pay

## 2019-10-07 ENCOUNTER — Encounter: Payer: Self-pay | Admitting: Family Medicine

## 2019-10-07 ENCOUNTER — Ambulatory Visit (INDEPENDENT_AMBULATORY_CARE_PROVIDER_SITE_OTHER): Payer: Medicare Other | Admitting: Family Medicine

## 2019-10-07 VITALS — BP 133/88 | HR 72 | Temp 96.9°F | Ht 64.0 in | Wt 157.0 lb

## 2019-10-07 DIAGNOSIS — E1159 Type 2 diabetes mellitus with other circulatory complications: Secondary | ICD-10-CM | POA: Diagnosis not present

## 2019-10-07 DIAGNOSIS — R1030 Lower abdominal pain, unspecified: Secondary | ICD-10-CM | POA: Diagnosis not present

## 2019-10-07 DIAGNOSIS — E785 Hyperlipidemia, unspecified: Secondary | ICD-10-CM | POA: Diagnosis not present

## 2019-10-07 DIAGNOSIS — E1169 Type 2 diabetes mellitus with other specified complication: Secondary | ICD-10-CM

## 2019-10-07 DIAGNOSIS — I10 Essential (primary) hypertension: Secondary | ICD-10-CM

## 2019-10-07 DIAGNOSIS — I152 Hypertension secondary to endocrine disorders: Secondary | ICD-10-CM

## 2019-10-07 LAB — MICROSCOPIC EXAMINATION
Bacteria, UA: NONE SEEN
Epithelial Cells (non renal): >10 /hpf — AB (ref 0–10)
RBC, Urine: NONE SEEN /hpf (ref 0–2)
Renal Epithel, UA: NONE SEEN /hpf

## 2019-10-07 LAB — URINALYSIS, COMPLETE
Bilirubin, UA: NEGATIVE
Glucose, UA: NEGATIVE
Ketones, UA: NEGATIVE
Leukocytes,UA: NEGATIVE
Nitrite, UA: NEGATIVE
Protein,UA: NEGATIVE
RBC, UA: NEGATIVE
Specific Gravity, UA: 1.02 (ref 1.005–1.030)
Urobilinogen, Ur: 0.2 mg/dL (ref 0.2–1.0)
pH, UA: 7 (ref 5.0–7.5)

## 2019-10-07 LAB — BAYER DCA HB A1C WAIVED: HB A1C (BAYER DCA - WAIVED): 7.3 % — ABNORMAL HIGH (ref <7.0)

## 2019-10-07 NOTE — Progress Notes (Signed)
Subjective: CC: DM PCP: [name], DO QG:[id number] Debra Phillips is a 86 y.o. female presenting to clinic today for:  1. Type 2 Diabetes w/ HTN, HLD:  Patient reports Taking medication(s): Metformin 1000 mg twice daily, pravastatin 40 mg daily, lisinopril 40 mg daily and Cardizem 180 mg daily.  Last eye exam: Needs Last foot exam: needs  Last A1c:  Lab Results  Component Value Date   HGBA1C 7.5 (H) 03/11/2019   Nephropathy screen indicated?: on ACE-I Last flu, zoster and/or pneumovax:  Immunization History  Administered Date(s) Administered  . Fluad Quad(high Dose 65+) 07/20/2019  . Influenza, High Dose Seasonal PF 06/13/2016, 06/18/2017, 06/29/2018  . Influenza,inj,Quad PF,6+ Mos 06/15/2015  . Influenza-Unspecified 04/25/2013, 05/10/2014  . Pneumococcal Conjugate-13 08/24/2013  . Pneumococcal Polysaccharide-23 06/17/1996  . Td 02/17/2017  . Tdap 02/17/2017  . Zoster 03/17/2014    ROS: Denies dizziness, LOC, polyuria, polydipsia, unintended weight loss/gain, foot ulcerations, numbness or tingling in extremities, shortness of breath or chest pain.  She reports chronic, stable LE edema.  2.  Lower abdominal pain She notes intermittent bilateral lower abdominal pain.  She does have alternating constipation and diarrhea.  No blood in stool.  She also has intermittent urinary and fecal incontinence.  She attributes this to "getting old".  No dysuria or hematuria.  ROS: Per HPI  Allergies  Allergen Reactions  . Codeine Nausea And Vomiting  . Doxycycline Hives  . Other Nausea Only    Almost all antibiotics  . Sulfonamide Derivatives Hives  . Vancomycin Itching   Past Medical History:  Diagnosis Date  . Abnormality of gait 01/29/2016  . Aortic root dilatation (HCC)    Mild  . Cataract   . Diabetes mellitus without complication ([place])    type 2  . Dyslipidemia   . Dyspnea    with exertion  . Dysrhythmia    hx of occ palpitations  . Esophageal stricture    s/p  dilation  . GERD (gastroesophageal reflux disease)   . Headache   . History of hiatal hernia   . Hyperlipidemia   . Hypertension   . Macular pucker, left eye   . Osteoarthritis   . Osteoporosis   . Tinnitus    left ear last week or so    Current Outpatient Medications:  .  aspirin EC 81 MG tablet, Take 81 mg by mouth daily., Disp: , Rfl:  .  Calcium Carb-Cholecalciferol 600-500 MG-UNIT CAPS, Take 1 capsule by mouth every morning., Disp: , Rfl:  .  Cholecalciferol 1000 UNITS tablet, Take 1,000 Units by mouth daily. , Disp: , Rfl:  .  diltiazem (CARDIZEM CD) 180 MG 24 hr capsule, TAKE (1) CAPSULE DAILY, Disp: 90 capsule, Rfl: 0 .  hydroxypropyl methylcellulose / hypromellose (ISOPTO TEARS / GONIOVISC) 2.5 % ophthalmic solution, Place 1 drop into both eyes 3 (three) times daily as needed for dry eyes., Disp: , Rfl:  .  Lancets (ONETOUCH ULTRASOFT) lancets, CHECK BLOOD SUGAR ONCE A DAY, Disp: 50 each, Rfl: 9 .  lisinopril (ZESTRIL) 40 MG tablet, TAKE 1 TABLET DAILY, Disp: 90 tablet, Rfl: 0 .  metFORMIN (GLUCOPHAGE) 1000 MG tablet, TAKE  (1)  TABLET TWICE A DAY WITH MEALS (BREAKFAST AND SUPPER), Disp: 180 tablet, Rfl: 0 .  nitroGLYCERIN (NITROSTAT) 0.4 MG SL tablet, Place 1 tablet (0.4 mg total) under the tongue every 5 (five) minutes as needed for chest pain., Disp: 25 tablet, Rfl: 3 .  ONETOUCH ULTRA test strip, Check BS daily Dx  E11.69, Disp: 100 strip, Rfl: 3 .  pravastatin (PRAVACHOL) 40 MG tablet, TAKE 1 TABLET DAILY, Disp: 90 tablet, Rfl: 0 .  ESTRACE VAGINAL 0.1 MG/GM vaginal cream, Place 1 Applicatorful vaginally once a week. (Patient not taking: Reported on 10/07/2019), Disp: 42.5 g, Rfl: 1 .  furosemide (LASIX) 20 MG tablet, Take 0.5 tablets (10 mg total) by mouth daily., Disp: 30 tablet, Rfl: 3 .  mirabegron ER (MYRBETRIQ) 25 MG TB24 tablet, Take 1 tablet (25 mg total) by mouth daily., Disp: 90 tablet, Rfl: 0 .  mirtazapine (REMERON) 7.5 MG tablet, Take 1 tablet (7.5 mg total) by  mouth at bedtime., Disp: 30 tablet, Rfl: 2 .  omeprazole (PRILOSEC) 40 MG capsule, Take 1 capsule (40 mg total) by mouth 2 (two) times daily., Disp: 180 capsule, Rfl: 1 Social History   Socioeconomic History  . Marital status: Widowed    Spouse name: Not on file  . Number of children: 5  . Years of education: 5  . Highest education level: Not on file  Occupational History  . 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 Smoking status: Never Smoker  . Smokeless tobacco: Never Used  Substance and Sexual Activity  . Alcohol use: No  . Drug use: No  . Sexual activity: Not Currently  Other Topics Concern  . Not on file  Social History Narrative   Pt lives at home alone   Husband w/ Alzheimer's passed away   Right-handed   Drinks 1-2 cups of coffee daily   Social Determinants of Health   Financial Resource Strain:   . Difficulty of Paying Living Expenses: Not on file  Food Insecurity:   . Worried About Charity fundraiser in the Last Year: Not on file  . Ran Out of Food in the Last Year: Not on file  Transportation Needs:   . Lack of Transportation (Medical): Not on file  . Lack of Transportation (Non-Medical): Not on file  Physical Activity:   . Days of Exercise per Week: Not on file  . Minutes of Exercise per Session: Not on file  Stress:   . Feeling of Stress : Not on file  Social Connections:   . Frequency of Communication with Friends and Family: Not on file  . Frequency of Social Gatherings with Friends and Family: Not on file  . Attends Religious Services: Not on file  . Active Member of Clubs or Organizations: Not on file  . Attends Archivist Meetings: Not on file  . Marital Status: Not on file  Intimate Partner Violence:   . Fear of Current or Ex-Partner: Not on file  . Emotionally Abused: Not on file  . Physically Abused: Not on file  . Sexually Abused: Not on file   Family History  Problem Relation  Age of Onset  . Pneumonia Mother   . Brain cancer Father   . Tremor Father   . Arthritis Sister   . COPD Sister   . Emphysema Brother   . COPD Brother   . Tremor Brother   . Lung cancer Brother   . Heart disease Brother   . Liver cancer Brother   . Diabetes Other         1 child has diabetes at age 61 and the other 4 are healthy    Objective: Office vital signs reviewed. BP 133/88   Pulse 72   Temp (!) 96.9 F (36.1  C) (Temporal)   Ht 5\' 4"  (1.626 Debra)   Wt 157 lb (71.2 kg)   SpO2 96%   BMI 26.95 kg/Debra   Physical Examination:  General: Awake, alert, No acute distress HEENT: Normal, sclera white, MMM Cardio: regular rate and rhythm, S1S2 heard, no murmurs appreciated Pulm: clear to auscultation bilaterally, no wheezes, rhonchi or rales; normal work of breathing on room air Extremities: warm, well perfused, trace - +1 pitting edema to mid shins. No cyanosis or clubbing; +2 pulses bilaterally MSK: slow gait and station; uses cane for ambulation Neuro: hard of hearing Diabetic Foot Exam - Simple   Simple Foot Form Diabetic Foot exam was performed with the following findings: Yes 10/07/2019 12:14 PM  Visual Inspection See comments: Yes Sensation Testing Intact to touch and monofilament testing bilaterally: Yes Pulse Check Posterior Tibialis and Dorsalis pulse intact bilaterally: Yes Comments She has a stronger dorsalis pulse than posterior tib pulse.  No ulcerations noted.  No preulcerative calluses.  She does have slight bunion     No results found for this or any previous visit (from the past 24 hour(s)).  Assessment/ Plan: 86 y.o. female   1. Type 2 diabetes mellitus with hyperlipidemia ([place]) Controlled for age.  A1c was 7.3 today.  Continue current regimen. - Bayer DCA Hb A1c Waived - Basic Metabolic Panel  2. Hyperlipidemia associated with type 2 diabetes mellitus ([place]) Continue statin  3. Hypertension associated with diabetes ([place]) Controlled.  Continue ACE  inhibitor  4. Lower abdominal pain Likely constipation.  Urinalysis without evidence of urinary tract infection.  We discussed increasing fiber, water.  If ongoing, low threshold to obtain KUB to evaluate for abnormal stool pattern. - Urinalysis, Complete   No orders of the defined types were placed in this encounter.  No orders of the defined types were placed in this encounter.    [name], DO [place] [phone]

## 2019-10-07 NOTE — Patient Instructions (Signed)
I'm checking your urine to make sure there is not an infection. For your bowels: make sure to drink enough water.  Take fiber supplement  Sugar is controlled.  Keep legs elevated to help with swelling.

## 2019-10-08 LAB — BASIC METABOLIC PANEL
BUN/Creatinine Ratio: 17 (ref 12–28)
BUN: 14 mg/dL (ref 8–27)
CO2: 24 mmol/L (ref 20–29)
Calcium: 9.9 mg/dL (ref 8.7–10.3)
Chloride: 98 mmol/L (ref 96–106)
Creatinine, Ser: 0.81 mg/dL (ref 0.57–1.00)
GFR calc Af Amer: 75 mL/min/{1.73_m2} (ref >59)
GFR calc non Af Amer: 65 mL/min/{1.73_m2} (ref >59)
Glucose: 188 mg/dL — ABNORMAL HIGH (ref 65–99)
Potassium: 4 mmol/L (ref 3.5–5.2)
Sodium: 137 mmol/L (ref 134–144)

## 2019-10-12 ENCOUNTER — Other Ambulatory Visit: Payer: Self-pay | Admitting: Family Medicine

## 2019-10-27 ENCOUNTER — Ambulatory Visit (INDEPENDENT_AMBULATORY_CARE_PROVIDER_SITE_OTHER): Payer: Medicare Other | Admitting: *Deleted

## 2019-10-27 VITALS — BP 133/88 | Wt 157.0 lb

## 2019-10-27 DIAGNOSIS — Z0000 Encounter for general adult medical examination without abnormal findings: Secondary | ICD-10-CM

## 2019-10-27 NOTE — Progress Notes (Signed)
MEDICARE ANNUAL WELLNESS VISIT  10/27/2019  Telephone Visit Disclaimer This Medicare AWV was conducted by telephone due to national recommendations for restrictions regarding the COVID-19 Pandemic (e.g. social distancing).  I verified, using two identifiers, that I am speaking with Debra Phillips or their authorized healthcare agent. I discussed the limitations, risks, security, and privacy concerns of performing an evaluation and management service by telephone and the potential availability of an in-person appointment in the future. The patient expressed understanding and agreed to proceed.   Subjective:  Debra Phillips is a 86 y.o. female patient of [name], DO who had a Medicare Annual Wellness Visit today via telephone. Debra Phillips is Retired and lives alone. she has 5 children. she reports that she is socially active and does interact with friends/family regularly. she is not physically active and enjoys gardening.  Patient Care Team: [name], DO as PCP - General (Family Medicine) Minus Breeding, MD as PCP - Cardiology (Cardiology) Irine Seal, MD as Consulting Physician (Urology) Minus Breeding, MD as Consulting Physician (Cardiology) Glenna Fellows, MD as Attending Physician (Neurosurgery) Dorene Ar, MD as Consulting Physician (Pain Medicine) Bunnie Pion, MD as Consulting Physician (Ophthalmology) Daneil Dolin, MD as Consulting Physician (Gastroenterology)  Advanced Directives 10/27/2019 04/11/2019 12/22/2017 08/29/2016 08/21/2016 06/15/2015 03/18/2014  Does Patient Have a Medical Advance Directive? No No No No No No Patient does not have advance directive;Patient would not like information  Would patient like information on creating a medical advance directive? No - Patient declined No - Patient declined No - Patient declined No - Patient declined;Yes (ED - Information included in AVS) No - Patient declined;Yes (ED - Information included in  AVS) Yes - Educational materials given -  Pre-existing out of facility DNR order (yellow form or pink MOST form) - - - - - - No    Phillips Utilization Over the Past 12 Months: # of hospitalizations or ER visits: 0 # of surgeries: 0  Review of Systems    Patient reports that her overall health is worse compared to last year.  General ROS: negative  Patient Reported Readings (BP, Pulse, CBG, Weight, etc) BP 133/88   Wt 157 lb (71.2 kg)   BMI 26.95 kg/m    Pain Assessment       Current Medications & Allergies (verified) Allergies as of 10/27/2019      Reactions   Codeine Nausea And Vomiting   Doxycycline Hives   Other Nausea Only   Almost all antibiotics   Sulfonamide Derivatives Hives   Vancomycin Itching      Medication List       Accurate as of October 27, 2019  9:51 AM. If you have any questions, ask your nurse or doctor.        aspirin EC 81 MG tablet Take 81 mg by mouth daily.   Calcium Carb-Cholecalciferol 600-500 MG-UNIT Caps Take 1 capsule by mouth every morning.   Cholecalciferol 25 MCG (1000 UT) tablet Take 1,000 Units by mouth daily.   diltiazem 180 MG 24 hr capsule Commonly known as: CARDIZEM CD TAKE (1) CAPSULE DAILY   furosemide 20 MG tablet Commonly known as: LASIX Take 0.5 tablets (10 mg total) by mouth daily.   hydroxypropyl methylcellulose / hypromellose 2.5 % ophthalmic solution Commonly known as: ISOPTO TEARS / GONIOVISC Place 1 drop into both eyes 3 (three) times daily as needed for dry eyes.   lisinopril 40 MG tablet Commonly known as: ZESTRIL TAKE 1 TABLET  DAILY   metFORMIN 1000 MG tablet Commonly known as: GLUCOPHAGE TAKE  (1)  TABLET TWICE A DAY WITH MEALS (BREAKFAST AND SUPPER)   nitroGLYCERIN 0.4 MG SL tablet Commonly known as: NITROSTAT Place 1 tablet (0.4 mg total) under the tongue every 5 (five) minutes as needed for chest pain.   OneTouch Ultra test strip Generic drug: glucose blood Check BS daily Dx E11.69     onetouch ultrasoft lancets CHECK BLOOD SUGAR ONCE A DAY   pantoprazole 40 MG tablet Commonly known as: PROTONIX Take 40 mg by mouth daily.   pravastatin 40 MG tablet Commonly known as: PRAVACHOL TAKE 1 TABLET DAILY       History (reviewed): Past Medical History:  Diagnosis Date  . Abnormality of gait 01/29/2016  . Aortic root dilatation (HCC)    Mild  . Cataract   . Diabetes mellitus without complication ([place])    type 2  . Dyslipidemia   . Dyspnea    with exertion  . Dysrhythmia    hx of occ palpitations  . Esophageal stricture    s/p dilation  . GERD (gastroesophageal reflux disease)   . Headache   . History of hiatal hernia   . Hyperlipidemia   . Hypertension   . Macular pucker, left eye   . Osteoarthritis   . Osteoporosis   . Tinnitus    left ear last week or so   Past Surgical History:  Procedure Laterality Date  . ABDOMINAL HYSTERECTOMY     partial  . APPENDECTOMY    . BACK SURGERY     lower x 1  . CARDIAC CATHETERIZATION  2010   both cataracts  . CHOLECYSTECTOMY  1993  . CYSTOSCOPY N/A 08/28/2016   Procedure: CYSTOSCOPY;  Surgeon: Carolan Clines, MD;  Location: [facility];  Service: Urology;  Laterality: N/A;  . EYE SURGERY     cataracts  . EYE SURGERY     for macular pucker  . FRACTURE SURGERY     rt wrist2010  . HARDWARE REMOVAL  12/10/2011   Procedure: HARDWARE REMOVAL;  Surgeon: Schuyler Amor, MD;  Location: [facility];  Service: Orthopedics;  Laterality: Right;  radial head  . KNEE ARTHROSCOPY  2004   Left and right  . KYPHOSIS SURGERY    . LUMBAR FUSION  [id number]  . RADIAL HEAD ARTHROPLASTY  5/12   orif rt radial head  . SHOULDER SURGERY     Right rotator cuff epair  . TOTAL KNEE ARTHROPLASTY     Left  . VAGINAL PROLAPSE REPAIR N/A 08/28/2016   Procedure: ANTERIOR VAGINAL VAULT SUSPENSION WITH SACROSPINOUS FIXATION;  Surgeon: Carolan Clines, MD;  Location: [facility];  Service: Urology;  Laterality: N/A;   Family  History  Problem Relation Age of Onset  . Pneumonia Mother   . Brain cancer Father   . Tremor Father   . Arthritis Sister   . COPD Sister   . Emphysema Brother   . COPD Brother   . Tremor Brother   . Lung cancer Brother   . Heart disease Brother   . Liver cancer Brother   . Diabetes Other         1 child has diabetes at age 46 and the other 62 are healthy   Social History   Socioeconomic History  . Marital status: Widowed    Spouse name: Not on file  . Number of children: 5  . Years of education: 5  . Highest education level: Not  on file  Occupational History  . 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 Smoking status: Never Smoker  . Smokeless tobacco: Never Used  Substance and Sexual Activity  . Alcohol use: No  . Drug use: No  . Sexual activity: Not Currently  Other Topics Concern  . Not on file  Social History Narrative   Pt lives at home alone   Husband w/ Alzheimer's passed away   Right-handed   Drinks 1-2 cups of coffee daily   Social Determinants of Health   Financial Resource Strain:   . Difficulty of Paying Living Expenses: Not on file  Food Insecurity:   . Worried About Charity fundraiser in the Last Year: Not on file  . Ran Out of Food in the Last Year: Not on file  Transportation Needs:   . Lack of Transportation (Medical): Not on file  . Lack of Transportation (Non-Medical): Not on file  Physical Activity:   . Days of Exercise per Week: Not on file  . Minutes of Exercise per Session: Not on file  Stress:   . Feeling of Stress : Not on file  Social Connections:   . Frequency of Communication with Friends and Family: Not on file  . Frequency of Social Gatherings with Friends and Family: Not on file  . Attends Religious Services: Not on file  . Active Member of Clubs or Organizations: Not on file  . Attends Archivist Meetings: Not on file  . Marital Status: Not on file    Activities  of Daily Living In your present state of health, do you have any difficulty performing the following activities: 10/27/2019  Hearing? N  Vision? Y  Comment glasses  Difficulty concentrating or making decisions? N  Walking or climbing stairs? Y  Dressing or bathing? N  Doing errands, shopping? N  Preparing Food and eating ? N  Using the Toilet? N  In the past six months, have you accidently leaked urine? N  Do you have problems with loss of bowel control? N  Managing your Medications? N  Managing your Finances? N  Housekeeping or managing your Housekeeping? N  Some recent data might be hidden    Patient Education/ Literacy    Exercise Current Exercise Habits: The patient does not participate in regular exercise at present, Exercise limited by: orthopedic condition(s)  Diet Patient reports consuming 2 meals a day and 1 snack(s) a day Patient reports that her primary diet is: Regular Patient reports that she does have regular access to food.   Depression Screen PHQ 2/9 Scores 10/27/2019 10/07/2019 09/15/2019 08/17/2019 07/04/2019 06/17/2019 04/26/2019  PHQ - 2 Score 0 2 3 0 1 2 2   PHQ- 9 Score - 9 6 - - 9 9     Fall Risk Fall Risk  10/27/2019 10/07/2019 09/15/2019 08/17/2019 07/04/2019  Falls in the past year? 0 - 1 0 0  Number falls in past yr: - 1 1 - -  Injury with Fall? - 1 1 - -  Comment - - - - -  Risk Factor Category  - - - - -  Risk for fall due to : - History of fall(s);Impaired mobility History of fall(s);Impaired mobility - -  Follow up - Falls prevention discussed Falls prevention discussed - -  Comment - - - - -     Objective:  Debra Phillips seemed alert and oriented and she participated appropriately  during Debra telephone visit.  Blood Pressure Weight BMI  BP Readings from Last 3 Encounters:  10/27/19 133/88  10/07/19 133/88  09/15/19 (!) 161/87   Wt Readings from Last 3 Encounters:  10/27/19 157 lb (71.2 kg)  10/07/19 157 lb (71.2 kg)  09/15/19 159 lb (72.1  kg)   BMI Readings from Last 1 Encounters:  10/27/19 26.95 kg/m    *Unable to obtain current vital signs, weight, and BMI due to telephone visit type  Hearing/Vision  . Debra Phillips did not seem to have difficulty with hearing/understanding during the telephone conversation . Reports that she has had a formal eye exam by an eye care professional within the past year . Reports that she has not had a formal hearing evaluation within the past year *Unable to fully assess hearing and vision during telephone visit type  Cognitive Function: 6CIT Screen 10/27/2019  What Year? 0 points  What month? 0 points  What time? 0 points  Count back from 20 0 points  Months in reverse 0 points  Repeat phrase 2 points  Total Score 2   (Normal:0-7, Significant for Dysfunction: >8)  Normal Cognitive Function Screening: Yes   Immunization & Health Maintenance Record Immunization History  Administered Date(s) Administered  . Fluad Quad(high Dose 65+) 07/20/2019  . Influenza, High Dose Seasonal PF 06/13/2016, 06/18/2017, 06/29/2018  . Influenza,inj,Quad PF,6+ Mos 06/15/2015  . Influenza-Unspecified 04/25/2013, 05/10/2014  . Pneumococcal Conjugate-13 08/24/2013  . Pneumococcal Polysaccharide-23 06/17/1996  . Td 02/17/2017  . Tdap 02/17/2017  . Zoster 03/17/2014    Health Maintenance  Topic Date Due  . DEXA SCAN  06/13/2018  . OPHTHALMOLOGY EXAM  09/19/2019  . MAMMOGRAM  02/16/2020  . HEMOGLOBIN A1C  04/05/2020  . FOOT EXAM  10/06/2020  . TETANUS/TDAP  02/18/2027  . INFLUENZA VACCINE  Completed  . PNA vac Low Risk Adult  Completed       Assessment  This is a routine wellness examination for Debra Phillips.  Health Maintenance: Due or Overdue Health Maintenance Due  Topic Date Due  . DEXA SCAN  06/13/2018  . OPHTHALMOLOGY EXAM  09/19/2019    Debra Phillips does not need a referral for Community Assistance: Care Management:   no Social Work:    no Prescription  Assistance:  no Nutrition/Diabetes Education:  no   Plan:  Personalized Goals Goals Addressed            This Visit's Progress   . Prevent falls        Personalized Health Maintenance & Screening Recommendations  Advanced directives: has NO advanced directive - not interested in additional information  Lung Cancer Screening Recommended: no (Low Dose CT Chest recommended if Age 42-80 years, 30 pack-year currently smoking OR have quit w/in past 15 years) Hepatitis C Screening recommended: no HIV Screening recommended: no  Advanced Directives: Written information was not prepared per patient's request.  Referrals & Orders No orders of the defined types were placed in this encounter.   Follow-up Plan . Follow-up with [name], DO as planned    I have personally reviewed and noted the following in the patient's chart:   . Medical and social history . Use of alcohol, tobacco or illicit drugs  . Current medications and supplements . Functional ability and status . Nutritional status . Physical activity . Advanced directives . List of other physicians . Hospitalizations, surgeries, and ER visits in previous 12 months . Vitals . Screenings to include cognitive, depression, and falls .  Referrals and appointments  In addition, I have reviewed and discussed with Debra Phillips certain preventive protocols, quality metrics, and best practice recommendations. A written personalized care plan for preventive services as well as general preventive health recommendations is available and can be mailed to the patient at her request.      [place] Dec  10/27/2019

## 2019-10-28 ENCOUNTER — Ambulatory Visit (INDEPENDENT_AMBULATORY_CARE_PROVIDER_SITE_OTHER): Payer: Medicare Other | Admitting: Family Medicine

## 2019-10-28 ENCOUNTER — Encounter: Payer: Self-pay | Admitting: Family Medicine

## 2019-10-28 DIAGNOSIS — M25552 Pain in left hip: Secondary | ICD-10-CM | POA: Diagnosis not present

## 2019-10-28 MED ORDER — PREDNISONE 20 MG PO TABS: ORAL_TABLET | ORAL | 0 refills | Status: DC

## 2019-10-28 NOTE — Progress Notes (Signed)
Virtual Visit via telephone Note Due to COVID-19 pandemic this visit was conducted virtually. This visit type was conducted due to national recommendations for restrictions regarding the COVID-19 Pandemic (e.g. social distancing, sheltering in place) in an effort to limit this patient's exposure and mitigate transmission in our community. All issues noted in this document were discussed and addressed.  A physical exam was not performed with this format.   I connected with Debra Phillips on 10/28/2019 at 1340 by telephone and verified that I am speaking with the correct person using two identifiers. Debra Phillips is currently located at home and no one is currently with them during visit. The provider, Monia Pouch, FNP is located in their office at time of visit.  I discussed the limitations, risks, security and privacy concerns of performing an evaluation and management service by telephone and the availability of in person appointments. I also discussed with the patient that there may be a patient responsible charge related to this service. The patient expressed understanding and agreed to proceed.  Subjective:  Patient ID: Debra Phillips, Debra Phillips    DOB: Jul 09, 1929, 86 y.o.   MRN: [id number]  Chief Complaint:  Hip Pain   HPI: Debra Phillips is a 86 y.o. Debra Phillips presenting on 10/28/2019 for Hip Pain   Pt states she has left hip pain for a week. She denies injury or wounds. No recent falls. Pt states the pain is in her left buttock and radiates to left upper leg. States she has tingling in her leg at times. No saddle anesthesia or loss of bowel or bladder. No fever, chills, weakness, or confusion.   Hip Pain  The incident occurred more than 1 week ago. There was no injury mechanism. The pain is present in the left hip and left leg. The quality of the pain is described as burning, aching and shooting. The pain is at a severity of 6/10. The pain is moderate. Associated symptoms include  an inability to bear weight and tingling. Pertinent negatives include no loss of motion, loss of sensation, muscle weakness or numbness. The symptoms are aggravated by movement, palpation and weight bearing. Treatments tried: topical rubs. The treatment provided no relief.     Relevant past medical, surgical, family, and social history reviewed and updated as indicated.  Allergies and medications reviewed and updated.   Past Medical History:  Diagnosis Date  . Abnormality of gait 01/29/2016  . Aortic root dilatation (HCC)    Mild  . Cataract   . Diabetes mellitus without complication ([place])    type 2  . Dyslipidemia   . Dyspnea    with exertion  . Dysrhythmia    hx of occ palpitations  . Esophageal stricture    s/p dilation  . GERD (gastroesophageal reflux disease)   . Headache   . History of hiatal hernia   . Hyperlipidemia   . Hypertension   . Macular pucker, left eye   . Osteoarthritis   . Osteoporosis   . Tinnitus    left ear last week or so    Past Surgical History:  Procedure Laterality Date  . ABDOMINAL HYSTERECTOMY     partial  . APPENDECTOMY    . BACK SURGERY     lower x 1  . CARDIAC CATHETERIZATION  2010   both cataracts  . CHOLECYSTECTOMY  1993  . CYSTOSCOPY N/A 08/28/2016   Procedure: CYSTOSCOPY;  Surgeon: Carolan Clines, MD;  Location: [facility];  Service: Urology;  Laterality:  N/A;  . EYE SURGERY     cataracts  . EYE SURGERY     for macular pucker  . FRACTURE SURGERY     rt wrist2010  . HARDWARE REMOVAL  12/10/2011   Procedure: HARDWARE REMOVAL;  Surgeon: Schuyler Amor, MD;  Location: [facility];  Service: Orthopedics;  Laterality: Right;  radial head  . KNEE ARTHROSCOPY  2004   Left and right  . KYPHOSIS SURGERY    . LUMBAR FUSION  [id number]  . RADIAL HEAD ARTHROPLASTY  5/12   orif rt radial head  . SHOULDER SURGERY     Right rotator cuff epair  . TOTAL KNEE ARTHROPLASTY     Left  . VAGINAL PROLAPSE REPAIR N/A 08/28/2016    Procedure: ANTERIOR VAGINAL VAULT SUSPENSION WITH SACROSPINOUS FIXATION;  Surgeon: Carolan Clines, MD;  Location: [facility];  Service: Urology;  Laterality: N/A;    Social History   Socioeconomic History  . Marital status: Widowed    Spouse name: Not on file  . Number of children: 5  . Years of education: 5  . Highest education level: Not on file  Occupational History  . 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 Smoking status: Never Smoker  . Smokeless tobacco: Never Used  Substance and Sexual Activity  . Alcohol use: No  . Drug use: No  . Sexual activity: Not Currently  Other Topics Concern  . Not on file  Social History Narrative   Pt lives at home alone   Husband w/ Alzheimer's passed away   Right-handed   Drinks 1-2 cups of coffee daily   Social Determinants of Health   Financial Resource Strain:   . Difficulty of Paying Living Expenses: Not on file  Food Insecurity:   . Worried About Charity fundraiser in the Last Year: Not on file  . Ran Out of Food in the Last Year: Not on file  Transportation Needs:   . Lack of Transportation (Medical): Not on file  . Lack of Transportation (Non-Medical): Not on file  Physical Activity:   . Days of Exercise per Week: Not on file  . Minutes of Exercise per Session: Not on file  Stress:   . Feeling of Stress : Not on file  Social Connections:   . Frequency of Communication with Friends and Family: Not on file  . Frequency of Social Gatherings with Friends and Family: Not on file  . Attends Religious Services: Not on file  . Active Member of Clubs or Organizations: Not on file  . Attends Archivist Meetings: Not on file  . Marital Status: Not on file  Intimate Partner Violence:   . Fear of Current or Ex-Partner: Not on file  . Emotionally Abused: Not on file  . Physically Abused: Not on file  . Sexually Abused: Not on file    Outpatient Encounter Medications as  of 10/28/2019  Medication Sig  . aspirin EC 81 MG tablet Take 81 mg by mouth daily.  . Calcium Carb-Cholecalciferol 600-500 MG-UNIT CAPS Take 1 capsule by mouth every morning.  . Cholecalciferol 1000 UNITS tablet Take 1,000 Units by mouth daily.   Marland Kitchen diltiazem (CARDIZEM CD) 180 MG 24 hr capsule TAKE (1) CAPSULE DAILY  . furosemide (LASIX) 20 MG tablet Take 0.5 tablets (10 mg total) by mouth daily.  . hydroxypropyl methylcellulose / hypromellose (ISOPTO TEARS / GONIOVISC) 2.5 % ophthalmic solution Place  1 drop into both eyes 3 (three) times daily as needed for dry eyes.  . Lancets (ONETOUCH ULTRASOFT) lancets CHECK BLOOD SUGAR ONCE A DAY  . lisinopril (ZESTRIL) 40 MG tablet TAKE 1 TABLET DAILY  . metFORMIN (GLUCOPHAGE) 1000 MG tablet TAKE  (1)  TABLET TWICE A DAY WITH MEALS (BREAKFAST AND SUPPER)  . nitroGLYCERIN (NITROSTAT) 0.4 MG SL tablet Place 1 tablet (0.4 mg total) under the tongue every 5 (five) minutes as needed for chest pain. (Patient not taking: Reported on 10/27/2019)  . ONETOUCH ULTRA test strip Check BS daily Dx E11.69  . pantoprazole (PROTONIX) 40 MG tablet Take 40 mg by mouth daily.  . pravastatin (PRAVACHOL) 40 MG tablet TAKE 1 TABLET DAILY  . predniSONE (DELTASONE) 20 MG tablet 2 po at sametime daily for 5 days   No facility-administered encounter medications on file as of 10/28/2019.    Allergies  Allergen Reactions  . Codeine Nausea And Vomiting  . Doxycycline Hives  . Other Nausea Only    Almost all antibiotics  . Sulfonamide Derivatives Hives  . Vancomycin Itching    Review of Systems  Constitutional: Negative for activity change, appetite change, chills, diaphoresis, fatigue, fever and unexpected weight change.  HENT: Negative.   Eyes: Negative.   Respiratory: Negative for cough, chest tightness and shortness of breath.   Cardiovascular: Negative for chest pain, palpitations and leg swelling.  Gastrointestinal: Negative for abdominal pain, blood in stool,  constipation, diarrhea, nausea and vomiting.  Endocrine: Negative.   Genitourinary: Negative for decreased urine volume, difficulty urinating, dysuria, frequency and urgency.  Musculoskeletal: Positive for arthralgias, gait problem and myalgias.  Skin: Negative.   Allergic/Immunologic: Negative.   Neurological: Positive for tingling. Negative for dizziness, weakness, numbness and headaches.  Hematological: Negative.   Psychiatric/Behavioral: Negative for confusion, hallucinations, sleep disturbance and suicidal ideas.  All other systems reviewed and are negative.        Observations/Objective: No vital signs or physical exam, this was a telephone or virtual health encounter.  Pt alert and oriented, answers all questions appropriately, and able to speak in full sentences.    Assessment and Plan: Debra Phillips was seen today for hip pain.  Diagnoses and all orders for this visit:  Left hip pain Reported symptoms consistent with left sciatica. No reported injuries or falls. No red flags concerning for Cauda Equina Syndrome. Symptomatic care discussed in detail. Medications as prescribed. Pt aware to report any new or worsening symptoms. Follow up in 2-4 weeks or sooner if needed.  -     predniSONE (DELTASONE) 20 MG tablet; 2 po at sametime daily for 5 days     Follow Up Instructions: Return in about 4 weeks (around 11/25/2019), or if symptoms worsen or fail to improve.    I discussed the assessment and treatment plan with the patient. The patient was provided an opportunity to ask questions and all were answered. The patient agreed with the plan and demonstrated an understanding of the instructions.   The patient was advised to call back or seek an in-person evaluation if the symptoms worsen or if the condition fails to improve as anticipated.  The above assessment and management plan was discussed with the patient. The patient verbalized understanding of and has agreed to the management  plan. Patient is aware to call the clinic if they develop any new symptoms or if symptoms persist or worsen. Patient is aware when to return to the clinic for a follow-up visit. Patient educated on when it  is appropriate to go to the emergency department.    I provided 15 minutes of non-face-to-face time during this encounter. The call started at 1340. The call ended at 1355. The other time was used for coordination of care.    Monia Pouch, FNP-C [facility] [address] [place], [place] [address] [phone] 10/28/2019

## 2019-11-01 ENCOUNTER — Other Ambulatory Visit: Payer: Self-pay | Admitting: Family Medicine

## 2019-11-09 ENCOUNTER — Telehealth: Payer: Self-pay | Admitting: Family Medicine

## 2019-11-09 NOTE — Chronic Care Management (AMB) (Signed)
  Chronic Care Management   Note  11/09/2019 Name: Debra Phillips MRN: [id number] DOB: August 23, 1929  Debra Phillips is a 86 y.o. year old female who is a primary care patient of [name], DO. I reached out to [facility] by phone today in response to a referral sent by Debra Phillips's health plan.     Ms. Fine was given information about Chronic Care Management services today including:  1. CCM service includes personalized support from designated clinical staff supervised by her physician, including individualized plan of care and coordination with other care providers 2. 24/7 contact phone numbers for assistance for urgent and routine care needs. 3. Service will only be billed when office clinical staff spend 20 minutes or more in a month to coordinate care. 4. Only one practitioner may furnish and bill the service in a calendar month. 5. The patient may stop CCM services at any time (effective at the end of the month) by phone call to the office staff. 6. The patient will be responsible for cost sharing (co-pay) of up to 20% of the service fee (after annual deductible is met).  Patient agreed to services and verbal consent obtained.   Follow up plan: Telephone appointment with care management team member scheduled for:03/13/2020  Noreene Larsson, [place], [place], [place] [address] Direct Dial: [phone] [email or web address] Website: [email or web address]

## 2019-11-22 ENCOUNTER — Ambulatory Visit (INDEPENDENT_AMBULATORY_CARE_PROVIDER_SITE_OTHER): Payer: Medicare Other | Admitting: Family Medicine

## 2019-11-22 ENCOUNTER — Encounter: Payer: Self-pay | Admitting: *Deleted

## 2019-11-22 ENCOUNTER — Other Ambulatory Visit: Payer: Self-pay

## 2019-11-22 VITALS — BP 125/63 | HR 82 | Temp 98.4°F | Ht 64.0 in | Wt 155.0 lb

## 2019-11-22 DIAGNOSIS — M62838 Other muscle spasm: Secondary | ICD-10-CM | POA: Diagnosis not present

## 2019-11-22 MED ORDER — TIZANIDINE HCL 4 MG PO TABS: 2.0000 mg | ORAL_TABLET | Freq: Three times a day (TID) | ORAL | 0 refills | Status: DC | PRN

## 2019-11-22 NOTE — Patient Instructions (Signed)
I have sent in a muscle relaxer called Tizandine today.  Start with 1/2 tablet (it can cause sleepiness).  Tylenol and heat are ok to use as well.  I have given you some neck stretches to do.  If things do not get better, please call us so I can refer you to the specialist.

## 2019-11-22 NOTE — Progress Notes (Signed)
Subjective: [id number] pain PCP: [name], DO [id number] Debra Phillips Debra Phillips is a 86 y.o. female presenting to clinic today for:  1. Shoulder pain Patient reports left-sided shoulder pain radiating to the neck.  She finds that turning her head makes pain worse.  She is status post completion of prednisone for her shoulder, corticosteroid injection for her hip.  She is using Tylenol and heat intermittently.  She has no degenerative changes throughout the lumbar spine.  Does not report any radicular symptoms.   ROS: Per HPI  Allergies  Allergen Reactions  . Codeine Nausea And Vomiting  . Doxycycline Hives  . Other Nausea Only    Almost all antibiotics  . Sulfonamide Derivatives Hives  . Vancomycin Itching   Past Medical History:  Diagnosis Date  . Abnormality of gait 01/29/2016  . Aortic root dilatation (HCC)    Mild  . Cataract   . Diabetes mellitus without complication ([place])    type 2  . Dyslipidemia   . Dyspnea    with exertion  . Dysrhythmia    hx of occ palpitations  . Esophageal stricture    s/p dilation  . GERD (gastroesophageal reflux disease)   . Headache   . History of hiatal hernia   . Hyperlipidemia   . Hypertension   . Macular pucker, left eye   . Osteoarthritis   . Osteoporosis   . Tinnitus    left ear last week or so    Current Outpatient Medications:  .  aspirin EC 81 MG tablet, Take 81 mg by mouth daily., Disp: , Rfl:  .  Calcium Carb-Cholecalciferol 600-500 MG-UNIT CAPS, Take 1 capsule by mouth every morning., Disp: , Rfl:  .  Cholecalciferol 1000 UNITS tablet, Take 1,000 Units by mouth daily. , Disp: , Rfl:  .  diltiazem (CARDIZEM CD) 180 MG 24 hr capsule, TAKE (1) CAPSULE DAILY, Disp: 90 capsule, Rfl: 0 .  furosemide (LASIX) 20 MG tablet, Take 0.5 tablets (10 mg total) by mouth daily., Disp: 30 tablet, Rfl: 3 .  hydroxypropyl methylcellulose / hypromellose (ISOPTO TEARS / GONIOVISC) 2.5 % ophthalmic solution, Place 1 drop into both eyes 3  (three) times daily as needed for dry eyes., Disp: , Rfl:  .  Lancets (ONETOUCH ULTRASOFT) lancets, CHECK BLOOD SUGAR ONCE A DAY, Disp: 50 each, Rfl: 9 .  lisinopril (ZESTRIL) 40 MG tablet, TAKE 1 TABLET DAILY, Disp: 90 tablet, Rfl: 0 .  metFORMIN (GLUCOPHAGE) 1000 MG tablet, TAKE  (1)  TABLET TWICE A DAY WITH MEALS (BREAKFAST AND SUPPER), Disp: 180 tablet, Rfl: 0 .  nitroGLYCERIN (NITROSTAT) 0.4 MG SL tablet, Place 1 tablet (0.4 mg total) under the tongue every 5 (five) minutes as needed for chest pain. (Patient not taking: Reported on 10/27/2019), Disp: 25 tablet, Rfl: 3 .  ONETOUCH ULTRA test strip, Check BS daily Dx E11.69, Disp: 100 strip, Rfl: 3 .  pantoprazole (PROTONIX) 40 MG tablet, Take 40 mg by mouth daily., Disp: , Rfl:  .  pravastatin (PRAVACHOL) 40 MG tablet, TAKE 1 TABLET DAILY, Disp: 90 tablet, Rfl: 0 .  predniSONE (DELTASONE) 20 MG tablet, 2 po at sametime daily for 5 days, Disp: 10 tablet, Rfl: 0 Social History   Socioeconomic History  . Marital status: Widowed    Spouse name: Not on file  . Number of children: 5  . Years of education: 5  . Highest education level: Not on file  Occupational History  . 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 Smoking status: Never Smoker  . Smokeless tobacco: Never Used  Substance and Sexual Activity  . Alcohol use: No  . Drug use: No  . Sexual activity: Not Currently  Other Topics Concern  . Not on file  Social History Narrative   Pt lives at home alone   Husband w/ Alzheimer's passed away   Right-handed   Drinks 1-2 cups of coffee daily   Social Determinants of Health   Financial Resource Strain:   . Difficulty of Paying Living Expenses:   Food Insecurity:   . Worried About Charity fundraiser in the Last Year:   . Arboriculturist in the Last Year:   Transportation Needs:   . Film/video editor (Medical):   Marland Kitchen Lack of Transportation (Non-Medical):   Physical Activity:     . Days of Exercise per Week:   . Minutes of Exercise per Session:   Stress:   . Feeling of Stress :   Social Connections:   . Frequency of Communication with Friends and Family:   . Frequency of Social Gatherings with Friends and Family:   . Attends Religious Services:   . Active Member of Clubs or Organizations:   . Attends Archivist Meetings:   Marland Kitchen Marital Status:   Intimate Partner Violence:   . Fear of Current or Ex-Partner:   . Emotionally Abused:   Marland Kitchen Physically Abused:   . Sexually Abused:    Family History  Problem Relation Age of Onset  . Pneumonia Mother   . Brain cancer Father   . Tremor Father   . Arthritis Sister   . COPD Sister   . Emphysema Brother   . COPD Brother   . Tremor Brother   . Lung cancer Brother   . Heart disease Brother   . Liver cancer Brother   . Diabetes Other         1 child has diabetes at age 5 and the other 4 are healthy    Objective: Office vital signs reviewed. BP 125/63   Pulse 82   Temp 98.4 F (36.9 C) (Temporal)   Ht 5\' 4"  (1.626 m)   Wt 155 lb (70.3 kg)   SpO2 98%   BMI 26.61 kg/m   Physical Examination:  General: Awake, alert, uncomfortable appearing MSK:   C-spine: Active range of motion limited in rotation and extension.  No midline tenderness palpation.  She does have left-sided paraspinal muscle and trapezius tenderness to palpation.  There is increased tonicity of the trapezius.  Assessment/ Plan: 86 y.o. female   1. Trapezius muscle spasm Physical exam consistent with a trapezius muscle spasm.  I have given her a muscle relaxer to have on hand.  Did offer trigger point injection but she declined any injections today.  She is status post treatment with prednisone as well as a corticosteroid shots I did not feel comfortable repeating these.  Continue heat and gentle stretches.  Home physical therapy handout provided.  She will follow-up if symptoms or not improving. - tiZANidine (ZANAFLEX) 4 MG tablet;  Take 0.5-1 tablets (2-4 mg total) by mouth every 8 (eight) hours as needed for muscle spasms.  Dispense: 30 tablet; Refill: 0   No orders of the defined types were placed in this encounter.  No orders of the defined types were placed in this encounter.    [name], DO [place] [phone]

## 2019-12-22 ENCOUNTER — Other Ambulatory Visit: Payer: Self-pay | Admitting: Family Medicine

## 2020-01-09 ENCOUNTER — Ambulatory Visit (INDEPENDENT_AMBULATORY_CARE_PROVIDER_SITE_OTHER): Payer: Medicare Other | Admitting: Family Medicine

## 2020-01-09 ENCOUNTER — Other Ambulatory Visit: Payer: Self-pay

## 2020-01-09 ENCOUNTER — Encounter: Payer: Self-pay | Admitting: Family Medicine

## 2020-01-09 ENCOUNTER — Other Ambulatory Visit: Payer: Self-pay | Admitting: Family Medicine

## 2020-01-09 VITALS — BP 137/78 | HR 76 | Temp 97.8°F | Ht 64.0 in | Wt 158.0 lb

## 2020-01-09 DIAGNOSIS — I152 Hypertension secondary to endocrine disorders: Secondary | ICD-10-CM

## 2020-01-09 DIAGNOSIS — E1169 Type 2 diabetes mellitus with other specified complication: Secondary | ICD-10-CM | POA: Diagnosis not present

## 2020-01-09 DIAGNOSIS — I10 Essential (primary) hypertension: Secondary | ICD-10-CM

## 2020-01-09 DIAGNOSIS — E785 Hyperlipidemia, unspecified: Secondary | ICD-10-CM

## 2020-01-09 DIAGNOSIS — R600 Localized edema: Secondary | ICD-10-CM | POA: Diagnosis not present

## 2020-01-09 DIAGNOSIS — E1159 Type 2 diabetes mellitus with other circulatory complications: Secondary | ICD-10-CM | POA: Diagnosis not present

## 2020-01-09 LAB — BAYER DCA HB A1C WAIVED: HB A1C (BAYER DCA - WAIVED): 7 % — ABNORMAL HIGH (ref <7.0)

## 2020-01-09 NOTE — Progress Notes (Signed)
Subjective: CC: DM, HTN, HLD, edema PCP: [name], DO [id number] Debra Phillips is a 86 y.o. female presenting to clinic today for:  1. Type 2 Diabetes w/ HTN, HLD:  Patient reports Taking medication(s): Metformin 1000 mg twice daily, pravastatin 40 mg daily, lisinopril 40 mg daily and Cardizem 180 mg daily. She admits to eating sweets but generally stays away from carbs.  Last eye exam: Needs Last foot exam: UTD Last A1c:  Lab Results  Component Value Date   HGBA1C 7.3 (H) 10/07/2019   Nephropathy screen indicated?: on ACE-I Last flu, zoster and/or pneumovax:  Immunization History  Administered Date(s) Administered  . Fluad Quad(high Dose 65+) 07/20/2019  . Influenza, High Dose Seasonal PF 06/13/2016, 06/18/2017, 06/29/2018  . Influenza,inj,Quad PF,6+ Mos 06/15/2015  . Influenza-Unspecified 04/25/2013, 05/10/2014  . Moderna SARS-COVID-2 Vaccination 10/10/2019, 11/21/2019  . Pneumococcal Conjugate-13 08/24/2013  . Pneumococcal Polysaccharide-23 06/17/1996  . Td 02/17/2017  . Tdap 02/17/2017  . Zoster 03/17/2014    ROS: Denies chest pain, SOB, falls.  She reports chronic, stable LE edema.  She continues to have urinary urgency.   ROS: Per HPI  Allergies  Allergen Reactions  . Codeine Nausea And Vomiting  . Doxycycline Hives  . Other Nausea Only    Almost all antibiotics  . Sulfonamide Derivatives Hives  . Vancomycin Itching   Past Medical History:  Diagnosis Date  . Abnormality of gait 01/29/2016  . Aortic root dilatation (HCC)    Mild  . Cataract   . Diabetes mellitus without complication ([place])    type 2  . Dyslipidemia   . Dyspnea    with exertion  . Dysrhythmia    hx of occ palpitations  . Esophageal stricture    s/p dilation  . GERD (gastroesophageal reflux disease)   . Headache   . History of hiatal hernia   . Hyperlipidemia   . Hypertension   . Macular pucker, left eye   . Osteoarthritis   . Osteoporosis   . Tinnitus    left ear last  week or so    Current Outpatient Medications:  .  aspirin EC 81 MG tablet, Take 81 mg by mouth daily., Disp: , Rfl:  .  Calcium Carb-Cholecalciferol 600-500 MG-UNIT CAPS, Take 1 capsule by mouth every morning., Disp: , Rfl:  .  Cholecalciferol 1000 UNITS tablet, Take 1,000 Units by mouth daily. , Disp: , Rfl:  .  diltiazem (CARDIZEM CD) 180 MG 24 hr capsule, TAKE (1) CAPSULE DAILY, Disp: 90 capsule, Rfl: 0 .  furosemide (LASIX) 20 MG tablet, Take 0.5 tablets (10 mg total) by mouth daily., Disp: 30 tablet, Rfl: 3 .  hydroxypropyl methylcellulose / hypromellose (ISOPTO TEARS / GONIOVISC) 2.5 % ophthalmic solution, Place 1 drop into both eyes 3 (three) times daily as needed for dry eyes., Disp: , Rfl:  .  Lancets (ONETOUCH ULTRASOFT) lancets, CHECK BLOOD SUGAR ONCE A DAY, Disp: 50 each, Rfl: 9 .  lisinopril (ZESTRIL) 40 MG tablet, TAKE 1 TABLET DAILY, Disp: 90 tablet, Rfl: 0 .  metFORMIN (GLUCOPHAGE) 1000 MG tablet, TAKE  (1)  TABLET TWICE A DAY WITH MEALS (BREAKFAST AND SUPPER), Disp: 180 tablet, Rfl: 0 .  MYRBETRIQ 25 MG TB24 tablet, Take 25 mg by mouth daily., Disp: , Rfl:  .  nitroGLYCERIN (NITROSTAT) 0.4 MG SL tablet, Place 1 tablet (0.4 mg total) under the tongue every 5 (five) minutes as needed for chest pain., Disp: 25 tablet, Rfl: 3 .  ONETOUCH ULTRA test strip, Check  BS daily Dx E11.69, Disp: 100 strip, Rfl: 3 .  pantoprazole (PROTONIX) 40 MG tablet, Take 40 mg by mouth daily., Disp: , Rfl:  .  pravastatin (PRAVACHOL) 40 MG tablet, TAKE 1 TABLET DAILY, Disp: 90 tablet, Rfl: 0 .  tiZANidine (ZANAFLEX) 4 MG tablet, Take 0.5-1 tablets (2-4 mg total) by mouth every 8 (eight) hours as needed for muscle spasms., Disp: 30 tablet, Rfl: 0 Social History   Socioeconomic History  . Marital status: Widowed    Spouse name: Not on file  . Number of children: 5  . Years of education: 5  . Highest education level: Not on file  Occupational History  . 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 Smoking status: Never Smoker  . Smokeless tobacco: Never Used  Substance and Sexual Activity  . Alcohol use: No  . Drug use: No  . Sexual activity: Not Currently  Other Topics Concern  . Not on file  Social History Narrative   Pt lives at home alone   Husband w/ Alzheimer's passed away   Right-handed   Drinks 1-2 cups of coffee daily   Social Determinants of Health   Financial Resource Strain:   . Difficulty of Paying Living Expenses:   Food Insecurity:   . Worried About Charity fundraiser in the Last Year:   . Arboriculturist in the Last Year:   Transportation Needs:   . Film/video editor (Medical):   Marland Kitchen Lack of Transportation (Non-Medical):   Physical Activity:   . Days of Exercise per Week:   . Minutes of Exercise per Session:   Stress:   . Feeling of Stress :   Social Connections:   . Frequency of Communication with Friends and Family:   . Frequency of Social Gatherings with Friends and Family:   . Attends Religious Services:   . Active Member of Clubs or Organizations:   . Attends Archivist Meetings:   Marland Kitchen Marital Status:   Intimate Partner Violence:   . Fear of Current or Ex-Partner:   . Emotionally Abused:   Marland Kitchen Physically Abused:   . Sexually Abused:    Family History  Problem Relation Age of Onset  . Pneumonia Mother   . Brain cancer Father   . Tremor Father   . Arthritis Sister   . COPD Sister   . Emphysema Brother   . COPD Brother   . Tremor Brother   . Lung cancer Brother   . Heart disease Brother   . Liver cancer Brother   . Diabetes Other         1 child has diabetes at age 59 and the other 4 are healthy    Objective: Office vital signs reviewed. BP 137/78   Pulse 76   Temp 97.8 F (36.6 C) (Temporal)   Ht 5\' 4"  (1.626 Debra)   Wt 158 lb (71.7 kg)   SpO2 100%   BMI 27.12 kg/Debra   Physical Examination:  General: Awake, alert, No acute distress HEENT: Normal, sclera white,  MMM Cardio: regular rate and rhythm, S1S2 heard, no murmurs appreciated Pulm: clear to auscultation bilaterally, no wheezes, rhonchi or rales; normal work of breathing on room air Extremities: warm, well perfused, 1+ pitting edema to mid shins. No cyanosis or clubbing; +2 pulses bilaterally MSK: slow gait and station; uses cane for ambulation Skin: healing abrasion noted along the left lower leg. Neuro:  hard of hearing  Assessment/ Plan: 86 y.o. female    1. Type 2 diabetes mellitus with hyperlipidemia (HCC) A1c stable/improved from previous check at 7.0 today.  Continue current regimen.  No changes.  May follow-up in 4 months - Bayer DCA Hb A1c Waived  2. Hyperlipidemia associated with type 2 diabetes mellitus ([place]) Continue statin  3. Hypertension associated with diabetes ([place]) Well-controlled.  Continue current regimen  4. Bilateral lower extremity edema Reinforced elevation of lower extremities, use of compression hose and increase physical activity as tolerated.  She has Lasix as needed.    Orders Placed This Encounter  Procedures  . Hemoglobin A1c   No orders of the defined types were placed in this encounter.    [name], DO [place] [phone]

## 2020-01-27 ENCOUNTER — Encounter: Payer: Self-pay | Admitting: *Deleted

## 2020-01-28 ENCOUNTER — Other Ambulatory Visit: Payer: Self-pay | Admitting: Family Medicine

## 2020-03-13 ENCOUNTER — Ambulatory Visit: Payer: Medicare Other | Admitting: *Deleted

## 2020-03-13 DIAGNOSIS — E1159 Type 2 diabetes mellitus with other circulatory complications: Secondary | ICD-10-CM

## 2020-03-13 DIAGNOSIS — E785 Hyperlipidemia, unspecified: Secondary | ICD-10-CM

## 2020-03-13 NOTE — Patient Instructions (Signed)
Follow-up Plan Face-to-face appointment with [place] scheduled for 04/09/20  Chong Sicilian, BSN, RN-BC [place] / [place] Management Direct Dial: [phone]

## 2020-03-13 NOTE — Chronic Care Management (AMB) (Signed)
  Chronic Care Management   Note  03/13/2020 Name: TALULAH SCHIRMER MRN: [id number] DOB: 1928/11/25   I reached out to Ms Kissick by telephone today to complete her Initial Visit. She was having difficulty hearing over the telephone and requested a face-to-face visit instead.   Follow up plan: Initial Face-to-Face visit with RN Care Manager scheduled for 04/09/20  Chong Sicilian, BSN, RN-BC [place] / [place] [facility] Direct Dial: [phone]

## 2020-03-22 ENCOUNTER — Other Ambulatory Visit: Payer: Self-pay | Admitting: Family Medicine

## 2020-04-09 ENCOUNTER — Ambulatory Visit: Payer: Medicare Other

## 2020-04-09 ENCOUNTER — Telehealth: Payer: Self-pay | Admitting: *Deleted

## 2020-04-09 NOTE — Telephone Encounter (Signed)
  Chronic Care Management   Outreach Note  04/09/2020 Name: Debra Phillips MRN: [id number] DOB: 1929/01/04  Referred by: [name], DO Reason for referral : Chronic Care Management (Initial Visit)   [name] was scheduled for a face-to-face Initial Visit today because she has difficulty hearing over the telephone. She did not make it in for her appointment. [name] was referred to the case management team for assistance with care management and care coordination.   Clinical Goals: . Over the next 10 days, patient will be contacted by a Care Guide to reschedule their Initial CCM Visit . Over the next 30 days, patient will have an Initial CCM Visit with a member of the embedded CCM team to discuss self-management of their chronic medical conditions  Interventions and Plan . Chart reviewed in preparation for initial visit . Collaboration with other care team members as needed . Request sent to care guides to reach out and reschedule patient's initial visit  Chong Sicilian, BSN, RN-BC [place] / [place] Management Direct Dial: [phone]

## 2020-04-12 ENCOUNTER — Telehealth: Payer: Self-pay | Admitting: *Deleted

## 2020-04-12 NOTE — Telephone Encounter (Signed)
Patient is rescheduled for 06/04/2020 for Telephone outreach

## 2020-04-12 NOTE — Chronic Care Management (AMB) (Signed)
  Chronic Care Management   Note  04/12/2020 Name: Debra Phillips MRN: [id number] DOB: Oct 28, 1928  [name] is a 86 y.o. year old female who is a primary care patient of [name], DO and is actively engaged with the care management team. I reached out to [facility] by phone today to assist with re-scheduling an initial visit with the RN Case Manager  Follow up plan: Telephone appointment with care management team member scheduled for: 06/04/2020  [place], [place] [facility]  [place], [place] [address] Direct Dial: [email or web address] Website: [email or web address]

## 2020-05-07 ENCOUNTER — Telehealth: Payer: Self-pay | Admitting: Family Medicine

## 2020-05-07 NOTE — Telephone Encounter (Signed)
No answer, no voicemail.

## 2020-05-07 NOTE — Telephone Encounter (Signed)
Appointment scheduled for ER follow up with Debra Phillips on 05/11/2020 at 11:00 am.  Patient aware.

## 2020-05-11 ENCOUNTER — Ambulatory Visit: Payer: Medicare Other | Admitting: Family Medicine

## 2020-05-14 ENCOUNTER — Encounter: Payer: Self-pay | Admitting: Family Medicine

## 2020-05-14 ENCOUNTER — Other Ambulatory Visit: Payer: Self-pay

## 2020-05-14 ENCOUNTER — Ambulatory Visit (INDEPENDENT_AMBULATORY_CARE_PROVIDER_SITE_OTHER): Payer: Medicare Other | Admitting: Family Medicine

## 2020-05-14 VITALS — BP 147/73 | HR 77 | Temp 97.4°F | Ht 64.0 in | Wt 150.0 lb

## 2020-05-14 DIAGNOSIS — E785 Hyperlipidemia, unspecified: Secondary | ICD-10-CM

## 2020-05-14 DIAGNOSIS — K5909 Other constipation: Secondary | ICD-10-CM | POA: Diagnosis not present

## 2020-05-14 DIAGNOSIS — R600 Localized edema: Secondary | ICD-10-CM | POA: Diagnosis not present

## 2020-05-14 DIAGNOSIS — I152 Hypertension secondary to endocrine disorders: Secondary | ICD-10-CM

## 2020-05-14 DIAGNOSIS — E1159 Type 2 diabetes mellitus with other circulatory complications: Secondary | ICD-10-CM

## 2020-05-14 DIAGNOSIS — E1169 Type 2 diabetes mellitus with other specified complication: Secondary | ICD-10-CM | POA: Diagnosis not present

## 2020-05-14 DIAGNOSIS — M25552 Pain in left hip: Secondary | ICD-10-CM

## 2020-05-14 DIAGNOSIS — W19XXXD Unspecified fall, subsequent encounter: Secondary | ICD-10-CM

## 2020-05-14 DIAGNOSIS — I10 Essential (primary) hypertension: Secondary | ICD-10-CM

## 2020-05-14 LAB — BAYER DCA HB A1C WAIVED: HB A1C (BAYER DCA - WAIVED): 7.2 % — ABNORMAL HIGH (ref <7.0)

## 2020-05-14 NOTE — Progress Notes (Signed)
Subjective: CC: DM, HTN, HLD, edema PCP: [name], DO [id number] M Opie is a 86 y.o. female presenting to clinic today for:  1. Type 2 Diabetes w/ HTN, HLD:  Patient reports Taking medication(s): Metformin 1000 mg twice daily, pravastatin 40 mg daily, lisinopril 40 mg daily and Cardizem 180 mg daily.  She continues to eat cereal, nabs and diet tea.  Does not eat any sweets.  Tries to limit intake.  She reports a fall that she sustained on 9/9.  She was seen in the emergency department in [place] where her CT scan was negative.  She has had no recurrent falls.  She denies overt loss of consciousness but fell straight backwards when the fall occurred.  She thinks this is because she did not walk around with her cane outside.  Since then she has been caring a cane everywhere.  Last eye exam: Needs Last foot exam: UTD Last A1c:  Lab Results  Component Value Date   HGBA1C 7.2 (H) 05/14/2020   Nephropathy screen indicated?: on ACE-I Last flu, zoster and/or pneumovax:  Immunization History  Administered Date(s) Administered   Fluad Quad(high Dose 65+) 07/20/2019   Influenza, High Dose Seasonal PF 06/13/2016, 06/18/2017, 06/29/2018   Influenza,inj,Quad PF,6+ Mos 06/15/2015   Influenza-Unspecified 04/25/2013, 05/10/2014   Moderna SARS-COVID-2 Vaccination 10/10/2019, 11/21/2019   Pneumococcal Conjugate-13 08/24/2013   Pneumococcal Polysaccharide-23 06/17/1996   Td 02/17/2017   Tdap 02/17/2017   Zoster 03/17/2014    ROS: Per HPI  Allergies  Allergen Reactions   Codeine Nausea And Vomiting   Doxycycline Hives   Other Nausea Only    Almost all antibiotics   Sulfonamide Derivatives Hives   Vancomycin Itching   Past Medical History:  Diagnosis Date   Abnormality of gait 01/29/2016   Aortic root dilatation (HCC)    Mild   Cataract    Diabetes mellitus without complication ([place])    type 2   Dyslipidemia    Dyspnea    with exertion    Dysrhythmia    hx of occ palpitations   Esophageal stricture    s/p dilation   GERD (gastroesophageal reflux disease)    Headache    History of hiatal hernia    Hyperlipidemia    Hypertension    Macular pucker, left eye    Osteoarthritis    Osteoporosis    Tinnitus    left ear last week or so    Current Outpatient Medications:    aspirin EC 81 MG tablet, Take 81 mg by mouth daily., Disp: , Rfl:    Calcium Carb-Cholecalciferol 600-500 MG-UNIT CAPS, Take 1 capsule by mouth every morning., Disp: , Rfl:    Cholecalciferol 1000 UNITS tablet, Take 1,000 Units by mouth daily. , Disp: , Rfl:    diltiazem (CARDIZEM CD) 180 MG 24 hr capsule, TAKE (1) CAPSULE DAILY, Disp: 90 capsule, Rfl: 1   furosemide (LASIX) 20 MG tablet, Take 0.5 tablets (10 mg total) by mouth daily., Disp: 30 tablet, Rfl: 3   hydroxypropyl methylcellulose / hypromellose (ISOPTO TEARS / GONIOVISC) 2.5 % ophthalmic solution, Place 1 drop into both eyes 3 (three) times daily as needed for dry eyes., Disp: , Rfl:    Lancets (ONETOUCH ULTRASOFT) lancets, CHECK BLOOD SUGAR ONCE A DAY, Disp: 50 each, Rfl: 9   lisinopril (ZESTRIL) 40 MG tablet, TAKE 1 TABLET DAILY, Disp: 90 tablet, Rfl: 1   metFORMIN (GLUCOPHAGE) 1000 MG tablet, TAKE  (1)  TABLET TWICE A DAY WITH MEALS (BREAKFAST AND  SUPPER), Disp: 180 tablet, Rfl: 0   MYRBETRIQ 25 MG TB24 tablet, Take 25 mg by mouth daily., Disp: , Rfl:    nitroGLYCERIN (NITROSTAT) 0.4 MG SL tablet, Place 1 tablet (0.4 mg total) under the tongue every 5 (five) minutes as needed for chest pain., Disp: 25 tablet, Rfl: 3   ONETOUCH ULTRA test strip, Check BS daily Dx E11.69, Disp: 100 strip, Rfl: 3   pantoprazole (PROTONIX) 40 MG tablet, Take 40 mg by mouth daily., Disp: , Rfl:    pravastatin (PRAVACHOL) 40 MG tablet, TAKE 1 TABLET DAILY, Disp: 90 tablet, Rfl: 1   tiZANidine (ZANAFLEX) 4 MG tablet, Take 0.5-1 tablets (2-4 mg total) by mouth every 8 (eight) hours as needed for  muscle spasms., Disp: 30 tablet, Rfl: 0 Social History   Socioeconomic History   Marital status: Widowed    Spouse name: Not on file   Number of children: 5   Years of education: 5   Highest education level: Not on file  Occupational History   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    Comment: Retired  Tobacco Use   Smoking status: Never Smoker   Smokeless tobacco: Never Used  Scientific laboratory technician Use: Never used  Substance and Sexual Activity   Alcohol use: No   Drug use: No   Sexual activity: Not Currently  Other Topics Concern   Not on file  Social History Narrative   Pt lives at home alone   Husband w/ Alzheimer's passed away   Right-handed   Drinks 1-2 cups of coffee daily   Social Determinants of Health   Financial Resource Strain:    Difficulty of Paying Living Expenses: Not on file  Food Insecurity:    Worried About Charity fundraiser in the Last Year: Not on file   [place] in the Last Year: Not on file  Transportation Needs:    Lack of Transportation (Medical): Not on file   Lack of Transportation (Non-Medical): Not on file  Physical Activity:    Days of Exercise per Week: Not on file   Minutes of Exercise per Session: Not on file  Stress:    Feeling of Stress : Not on file  Social Connections:    Frequency of Communication with Friends and Family: Not on file   Frequency of Social Gatherings with Friends and Family: Not on file   Attends Religious Services: Not on file   Active Member of Clubs or Organizations: Not on file   Attends Archivist Meetings: Not on file   Marital Status: Not on file  Intimate Partner Violence:    Fear of Current or Ex-Partner: Not on file   Emotionally Abused: Not on file   Physically Abused: Not on file   Sexually Abused: Not on file   Family History  Problem Relation Age of Onset   Pneumonia Mother    Brain cancer Father    Tremor Father     Arthritis Sister    COPD Sister    Emphysema Brother    COPD Brother    Tremor Brother    Lung cancer Brother    Heart disease Brother    Liver cancer Brother    Diabetes Other         1 child has diabetes at age 108 and the other 4 are healthy    Objective: Office vital signs reviewed. BP (!) 147/73    Pulse 77  Temp (!) 97.4 F (36.3 C) (Temporal)    Ht 5\' 4"  (1.626 m)    Wt 150 lb (68 kg)    SpO2 99%    BMI 25.75 kg/m   Physical Examination:  General: Awake, alert, No acute distress HEENT: Normal, no palpable bony abnormalities.  Sclera white, MMM Cardio: regular rate and rhythm, S1S2 heard, no murmurs appreciated Pulm: clear to auscultation bilaterally, no wheezes, rhonchi or rales; normal work of breathing on room air Extremities: warm, well perfused, 1+ pitting edema to mid shins. No cyanosis or clubbing; +2 pulses bilaterally MSK: slow gait and station; uses cane for ambulation Skin: Venous stasis changes noted.  Assessment/ Plan: 86 y.o. female    1. Chronic constipation Recommended use of Colace.  2. Type 2 diabetes mellitus with hyperlipidemia (HCC) A1c at 7.2.  No medication adjustments have been made given falls and frailty. - Bayer DCA Hb A1c Waived  3. Hypertension associated with diabetes ([place]) Stable.  Continue current regimen  4. Hyperlipidemia associated with type 2 diabetes mellitus ([place]) Continue statin  5. Bilateral lower extremity edema Stable  6. Left hip pain Possibly contributing to fall.  Recommend use of cane.  Offered PT but she declined this.  7. Fall, subsequent encounter Imaging did not demonstrate any acute abnormalities.  Again offered PT as above and/or referral back to her orthopedist but she declined this today.   Orders Placed This Encounter  Procedures   Bayer DCA Hb A1c Waived   Basic Metabolic Panel   No orders of the defined types were placed in this encounter.    [name], DO [place] [phone]

## 2020-05-14 NOTE — Patient Instructions (Signed)
COLACE (generic fine) 1-2 times daily for constipation. DRINK WATER, at least 3-4 glasses per day!

## 2020-05-15 LAB — BASIC METABOLIC PANEL
BUN/Creatinine Ratio: 17 (ref 12–28)
BUN: 13 mg/dL (ref 8–27)
CO2: 25 mmol/L (ref 20–29)
Calcium: 9.5 mg/dL (ref 8.7–10.3)
Chloride: 97 mmol/L (ref 96–106)
Creatinine, Ser: 0.77 mg/dL (ref 0.57–1.00)
GFR calc Af Amer: 79 mL/min/{1.73_m2} (ref >59)
GFR calc non Af Amer: 69 mL/min/{1.73_m2} (ref >59)
Glucose: 282 mg/dL — ABNORMAL HIGH (ref 65–99)
Potassium: 4.7 mmol/L (ref 3.5–5.2)
Sodium: 136 mmol/L (ref 134–144)

## 2020-05-16 ENCOUNTER — Telehealth: Payer: Self-pay | Admitting: Family Medicine

## 2020-05-16 NOTE — Telephone Encounter (Signed)
Spoke to patient and she is aware of results

## 2020-05-25 ENCOUNTER — Encounter: Payer: Self-pay | Admitting: Family Medicine

## 2020-05-25 ENCOUNTER — Ambulatory Visit (INDEPENDENT_AMBULATORY_CARE_PROVIDER_SITE_OTHER): Payer: Medicare Other | Admitting: Family Medicine

## 2020-05-25 ENCOUNTER — Other Ambulatory Visit: Payer: Self-pay

## 2020-05-25 VITALS — BP 125/83 | HR 77 | Temp 97.2°F | Ht 64.0 in | Wt 149.2 lb

## 2020-05-25 DIAGNOSIS — W57XXXA Bitten or stung by nonvenomous insect and other nonvenomous arthropods, initial encounter: Secondary | ICD-10-CM

## 2020-05-25 DIAGNOSIS — S51011A Laceration without foreign body of right elbow, initial encounter: Secondary | ICD-10-CM | POA: Diagnosis not present

## 2020-05-25 DIAGNOSIS — S80261A Insect bite (nonvenomous), right knee, initial encounter: Secondary | ICD-10-CM | POA: Diagnosis not present

## 2020-05-25 NOTE — Progress Notes (Signed)
BP 125/83   Pulse 77   Temp (!) 97.2 F (36.2 C) (Temporal)   Ht 5\' 4"  (1.626 m)   Wt 149 lb 4 oz (67.7 kg)   BMI 25.62 kg/m    Subjective:   Patient ID: Debra Phillips, female    DOB: 11/22/1928, 86 y.o.   MRN: [id number]  HPI: Debra Phillips is a 86 y.o. female presenting on 05/25/2020 for No chief complaint on file.   HPI Patient is coming in today with complaints of a skin tear on her right lower leg.  She says she got it yesterday and was getting out of the car and the bottom of the car door hit her leg and caused a scrape, her daughter dressed it yesterday with a bandage and she is coming in today for this.  She denies any redness or warmth or drainage.  There was a small amount of bleeding from it.  Patient has had a tick bite on her right inner knee, she thinks it was about a week ago or maybe more.  She says is because a little bit of rash and irritation around the spot.  She denies any fevers or chills or body aches.  Relevant past medical, surgical, family and social history reviewed and updated as indicated. Interim medical history since our last visit reviewed. Allergies and medications reviewed and updated.  Review of Systems  Constitutional: Negative for chills and fever.  Eyes: Negative for visual disturbance.  Respiratory: Negative for chest tightness and shortness of breath.   Cardiovascular: Negative for chest pain and leg swelling.  Musculoskeletal: Negative for back pain and gait problem.  Skin: Positive for rash and wound.  Neurological: Negative for light-headedness and headaches.  Psychiatric/Behavioral: Negative for agitation and behavioral problems.  All other systems reviewed and are negative.   Per HPI unless specifically indicated above      Objective:   BP 125/83   Pulse 77   Temp (!) 97.2 F (36.2 C) (Temporal)   Ht 5\' 4"  (1.626 m)   Wt 149 lb 4 oz (67.7 kg)   BMI 25.62 kg/m   Wt Readings from Last 3 Encounters:  05/25/20 149  lb 4 oz (67.7 kg)  05/14/20 150 lb (68 kg)  01/09/20 158 lb (71.7 kg)    Physical Exam Vitals and nursing note reviewed.  Constitutional:      General: She is not in acute distress.    Appearance: She is well-developed. She is not diaphoretic.  Eyes:     Conjunctiva/sclera: Conjunctivae normal.  Skin:    General: Skin is warm and dry.     Findings: Rash (Pink papular rash on right inner knee, no warmth or redness, no targetoid appearance.) and wound present.       Neurological:     Mental Status: She is alert and oriented to person, place, and time.     Coordination: Coordination normal.  Psychiatric:        Behavior: Behavior normal.       Assessment & Plan:   Problem List Items Addressed This Visit    None    Visit Diagnoses    Tick bite of right knee, initial encounter    -  Primary   Relevant Orders   CBC with Differential/Platelet   Rocky mtn spotted fvr abs pnl(IgG+IgM)   Lyme Ab/Western Blot Reflex   Skin tear of right elbow without complication, initial encounter          Patient  has small skin tear on her right shin, very superficial and minimal bleeding, placed antibiotic and simple bandage and change daily.  Tick bite left small rash on right inner knee, will test for Lyme and [facility] spotted fever just to be sure but looks more like an irritant reaction. Follow up plan: Return if symptoms worsen or fail to improve.  Counseling provided for all of the vaccine components Orders Placed This Encounter  Procedures  . CBC with Differential/Platelet  . Rocky mtn spotted fvr abs pnl(IgG+IgM)  . Lyme Ab/Western Blot Reflex    Caryl Pina, MD [facility] 05/25/2020, 10:21 AM

## 2020-05-29 LAB — ROCKY MTN SPOTTED FVR ABS PNL(IGG+IGM)
RMSF IgG: NEGATIVE
RMSF IgM: 0.24 index (ref 0.00–0.89)

## 2020-05-29 LAB — CBC WITH DIFFERENTIAL/PLATELET
Basophils Absolute: 0 10*3/uL (ref 0.0–0.2)
Basos: 1 %
EOS (ABSOLUTE): 0.1 10*3/uL (ref 0.0–0.4)
Eos: 2 %
Hematocrit: 37.2 % (ref 34.0–46.6)
Hemoglobin: 12 g/dL (ref 11.1–15.9)
Immature Grans (Abs): 0 10*3/uL (ref 0.0–0.1)
Immature Granulocytes: 0 %
Lymphocytes Absolute: 2 10*3/uL (ref 0.7–3.1)
Lymphs: 27 %
MCH: 29.1 pg (ref 26.6–33.0)
MCHC: 32.3 g/dL (ref 31.5–35.7)
MCV: 90 fL (ref 79–97)
Monocytes Absolute: 0.5 10*3/uL (ref 0.1–0.9)
Monocytes: 7 %
Neutrophils Absolute: 4.7 10*3/uL (ref 1.4–7.0)
Neutrophils: 63 %
Platelets: 360 10*3/uL (ref 150–450)
RBC: 4.12 x10E6/uL (ref 3.77–5.28)
RDW: 13.9 % (ref 11.7–15.4)
WBC: 7.3 10*3/uL (ref 3.4–10.8)

## 2020-05-29 LAB — LYME AB/WESTERN BLOT REFLEX
LYME DISEASE AB, QUANT, IGM: <0.8 index (ref 0.00–0.79)
Lyme IgG/IgM Ab: <0.91 {ISR} (ref 0.00–0.90)

## 2020-06-04 ENCOUNTER — Other Ambulatory Visit: Payer: Medicare Other

## 2020-06-04 ENCOUNTER — Ambulatory Visit (INDEPENDENT_AMBULATORY_CARE_PROVIDER_SITE_OTHER): Payer: Medicare Other | Admitting: *Deleted

## 2020-06-04 ENCOUNTER — Other Ambulatory Visit: Payer: Self-pay

## 2020-06-04 DIAGNOSIS — E785 Hyperlipidemia, unspecified: Secondary | ICD-10-CM

## 2020-06-04 DIAGNOSIS — E1169 Type 2 diabetes mellitus with other specified complication: Secondary | ICD-10-CM | POA: Diagnosis not present

## 2020-06-04 DIAGNOSIS — I10 Essential (primary) hypertension: Secondary | ICD-10-CM | POA: Diagnosis not present

## 2020-06-04 DIAGNOSIS — R296 Repeated falls: Secondary | ICD-10-CM

## 2020-06-04 DIAGNOSIS — R300 Dysuria: Secondary | ICD-10-CM

## 2020-06-04 LAB — URINALYSIS, COMPLETE
Bilirubin, UA: NEGATIVE
Glucose, UA: NEGATIVE
Ketones, UA: NEGATIVE
Leukocytes,UA: NEGATIVE
Nitrite, UA: NEGATIVE
Protein,UA: NEGATIVE
RBC, UA: NEGATIVE
Specific Gravity, UA: 1.01 (ref 1.005–1.030)
Urobilinogen, Ur: 0.2 mg/dL (ref 0.2–1.0)
pH, UA: 6.5 (ref 5.0–7.5)

## 2020-06-04 LAB — MICROSCOPIC EXAMINATION: Bacteria, UA: NONE SEEN

## 2020-06-04 NOTE — Patient Instructions (Signed)
Visit Information  Goals Addressed            This Visit's Progress   . Chronic Disease Management Needs       CARE PLAN ENTRY (see longtitudinal plan of care for additional care plan information)  Current Barriers:  . Chronic Disease Management support, education, and care coordination needs related to HTN, GERD, DM, OA, osteoporosis, HLD, osteoarthritis, macular degeneration, abnormal gait with frequent falls  Clinical Goal(s) related to HTN, GERD, DM, OA, osteoporosis, HLD, osteoarthritis, macular degeneration, abnormal gait with frequent falls:  Over the next 45 days, patient will:  . Work with the care management team to address educational, disease management, and care coordination needs  . Begin or continue self health monitoring activities as directed today Measure and record cbg (blood glucose) 1 times daily and Measure and record blood pressure 4 times per week . Call provider office for new or worsened signs and symptoms Blood glucose findings outside established parameters and Blood pressure findings outside established parameters . Call care management team with questions or concerns . Verbalize basic understanding of patient centered plan of care established today  Interventions related to HTN, GERD, DM, OA, osteoporosis, HLD, osteoarthritis, macular degeneration, abnormal gait with frequent falls:  . Evaluation of current treatment plans and patient's adherence to plan as established by provider . Assessed patient understanding of disease states . Assessed patient's education and care coordination needs . Provided disease specific education to patient  . Collaborated with appropriate clinical care team members regarding patient needs . Chart reviewed including recent office notes and telephone notes . Discussed mobility and use of Cane for ambulation . Discussed hx of falls and fall screening performed . Encouraged safety measures and safety devices to prevent  falls . Encouraged continued use of cane for all ambulation and use of other assistive devices . Discussed family/social support o Very active in church o Daughters help with anything she needs . Discussed transportation o She drives locally and daughters drive outside of the local area . Collaborated with triage nurse regard acute UTI symptoms of dysuria and frequency . Discussed ability to perform ADLs . Reviewed and discussed upcoming appointments . Encouraged patient to reach out to [place] as needed  Patient Self Care Activities related to HTN, GERD, DM, OA, osteoporosis, HLD, osteoarthritis, macular degeneration, abnormal gait with frequent falls:  . Patient is unable to independently self-manage chronic health conditions  Initial goal documentation     . Monitor and Manage My Blood Sugar       Follow Up Date 07/24/2020    - check blood sugar at prescribed times - check blood sugar if I feel it is too high or too low - take the blood sugar log to all doctor visits - take the blood sugar meter to all doctor visits    Why is this important?   Checking your blood sugar at home helps to keep it from getting very high or very low.  Writing the results in a diary or log helps the doctor know how to care for you.  Your blood sugar log should have the time, date and the results.  Also, write down the amount of insulin or other medicine that you take.  Other information, like what you ate, exercise done and how you were feeling, will also be helpful.     Notes:     . Prevent Falls and Injury       Follow Up Date 07/24/2020    -  always use handrails on the stairs - learn how to get back up if I fall - make an emergency alert plan in case I fall - pick up clutter from the floors - use a nonslip pad with throw rugs, or remove them completely - use a cane or walker - use a nightlight in the bathroom - wear my glasses and/or hearing aid    Why is this important?   Most  falls happen when it is hard for you to walk safely. Your balance may be off because of an illness. You may have pain in your knees, hip or other joints.  You may be overly tired or taking medicines that make you sleepy. You may not be able to see or hear clearly.  Falls can lead to broken bones, bruises or other injuries.  There are things you can do to help prevent falling.     Notes:     . Track and Manage My Blood Pressure       Follow Up Date 07/24/2020    - check blood pressure 3 times per week - write blood pressure results in a log or diary    Why is this important?   You won't feel high blood pressure, but it can still hurt your blood vessels.  High blood pressure can cause heart or kidney problems. It can also cause a stroke.  Making lifestyle changes like losing a little weight or eating less salt will help.  Checking your blood pressure at home and at different times of the day can help to control blood pressure.  If the doctor prescribes medicine remember to take it the way the doctor ordered.  Call the office if you cannot afford the medicine or if there are questions about it.     Notes:        Ms. Bywater was given information about Chronic Care Management services today including:  1. CCM service includes personalized support from designated clinical staff supervised by her physician, including individualized plan of care and coordination with other care providers 2. 24/7 contact phone numbers for assistance for urgent and routine care needs. 3. Service will only be billed when office clinical staff spend 20 minutes or more in a month to coordinate care. 4. Only one practitioner may furnish and bill the service in a calendar month. 5. The patient may stop CCM services at any time (effective at the end of the month) by phone call to the office staff. 6. The patient will be responsible for cost sharing (co-pay) of up to 20% of the service fee (after annual deductible  is met).  Patient agreed to services and verbal consent obtained.   Patient verbalizes understanding of instructions provided today.   The care management team will reach out to the patient again over the next 45 days.   Chong Sicilian, BSN, RN-BC Embedded Chronic Care Manager [facility] / [facility] Direct Dial: [phone]

## 2020-06-04 NOTE — Chronic Care Management (AMB) (Signed)
Chronic Care Management   Initial Visit Note  06/04/2020 Name: Debra Phillips MRN: [id number] DOB: 1929/06/05  Referred by: [name], DO Reason for referral : Chronic Care Management (Initial Visit)   IVETTE Phillips is a 86 y.o. year old female who is a primary care patient of [name], DO. The CCM team was consulted for assistance with chronic disease management and care coordination needs related to HTN, GERD, DM, OA, osteoporosis, HLD, osteoarthritis, macular degeneration, abnormal gait with frequent falls.  Review of patient status, including review of consultants reports, relevant laboratory and other test results, and collaboration with appropriate care team members and the patient's provider was performed as part of comprehensive patient evaluation and provision of chronic care management services.    Subjective: I spoke with [name] by telephone regarding management of her chronic medical conditions.   SDOH (Social Determinants of Health) assessments performed: Yes See Care Plan activities for detailed interventions related to SDOH     Objective: Outpatient Encounter Medications as of 06/04/2020  Medication Sig  . aspirin EC 81 MG tablet Take 81 mg by mouth daily.  . Calcium Carb-Cholecalciferol 600-500 MG-UNIT CAPS Take 1 capsule by mouth every morning.  . Cholecalciferol 1000 UNITS tablet Take 1,000 Units by mouth daily.   Marland Kitchen diltiazem (CARDIZEM CD) 180 MG 24 hr capsule TAKE (1) CAPSULE DAILY  . furosemide (LASIX) 20 MG tablet Take 0.5 tablets (10 mg total) by mouth daily.  . hydroxypropyl methylcellulose / hypromellose (ISOPTO TEARS / GONIOVISC) 2.5 % ophthalmic solution Place 1 drop into both eyes 3 (three) times daily as needed for dry eyes.  . Lancets (ONETOUCH ULTRASOFT) lancets CHECK BLOOD SUGAR ONCE A DAY  . lisinopril (ZESTRIL) 40 MG tablet TAKE 1 TABLET DAILY  . metFORMIN (GLUCOPHAGE) 1000 MG tablet TAKE  (1)  TABLET TWICE A DAY WITH  MEALS (BREAKFAST AND SUPPER)  . MYRBETRIQ 25 MG TB24 tablet Take 25 mg by mouth daily.  . nitroGLYCERIN (NITROSTAT) 0.4 MG SL tablet Place 1 tablet (0.4 mg total) under the tongue every 5 (five) minutes as needed for chest pain.  [name] ULTRA test strip Check BS daily Dx E11.69  . pantoprazole (PROTONIX) 40 MG tablet Take 40 mg by mouth daily.  . pravastatin (PRAVACHOL) 40 MG tablet TAKE 1 TABLET DAILY  . tiZANidine (ZANAFLEX) 4 MG tablet Take 0.5-1 tablets (2-4 mg total) by mouth every 8 (eight) hours as needed for muscle spasms.   No facility-administered encounter medications on file as of 06/04/2020.    BP Readings from Last 3 Encounters:  05/25/20 125/83  05/14/20 (!) 147/73  01/09/20 137/78   Lab Results  Component Value Date   HGBA1C 7.2 (H) 05/14/2020   HGBA1C 7.0 (H) 01/09/2020   HGBA1C 7.3 (H) 10/07/2019   Lab Results  Component Value Date   MICROALBUR NEGATIVE 09/01/2014   LDLCALC 66 09/03/2018   CREATININE 0.77 05/14/2020     Goals Addressed            This Visit's Progress   . Chronic Disease Management Needs       CARE PLAN ENTRY (see longtitudinal plan of care for additional care plan information)  Current Barriers:  . Chronic Disease Management support, education, and care coordination needs related to HTN, GERD, DM, OA, osteoporosis, HLD, osteoarthritis, macular degeneration, abnormal gait with frequent falls  Clinical Goal(s) related to HTN, GERD, DM, OA, osteoporosis, HLD, osteoarthritis, macular degeneration, abnormal gait with frequent falls:  Over the  next 45 days, patient will:  . Work with the care management team to address educational, disease management, and care coordination needs  . Begin or continue self health monitoring activities as directed today Measure and record cbg (blood glucose) 1 times daily and Measure and record blood pressure 4 times per week . Call provider office for new or worsened signs and symptoms Blood glucose  findings outside established parameters and Blood pressure findings outside established parameters . Call care management team with questions or concerns . Verbalize basic understanding of patient centered plan of care established today  Interventions related to HTN, GERD, DM, OA, osteoporosis, HLD, osteoarthritis, macular degeneration, abnormal gait with frequent falls:  . Evaluation of current treatment plans and patient's adherence to plan as established by provider . Assessed patient understanding of disease states . Assessed patient's education and care coordination needs . Provided disease specific education to patient  . Collaborated with appropriate clinical care team members regarding patient needs . Chart reviewed including recent office notes and telephone notes . Discussed mobility and use of Cane for ambulation . Discussed hx of falls and fall screening performed . Encouraged safety measures and safety devices to prevent falls . Encouraged continued use of cane for all ambulation and use of other assistive devices . Discussed family/social support o Very active in church o Daughters help with anything she needs . Discussed transportation o She drives locally and daughters drive outside of the local area . Collaborated with triage nurse regard acute UTI symptoms of dysuria and frequency . Discussed ability to perform ADLs . Reviewed and discussed upcoming appointments . Encouraged patient to reach out to [place] as needed  Patient Self Care Activities related to HTN, GERD, DM, OA, osteoporosis, HLD, osteoarthritis, macular degeneration, abnormal gait with frequent falls:  . Patient is unable to independently self-manage chronic health conditions  Initial goal documentation     . Monitor and Manage My Blood Sugar       Follow Up Date 07/24/2020    - check blood sugar at prescribed times - check blood sugar if I feel it is too high or too low - take the blood  sugar log to all doctor visits - take the blood sugar meter to all doctor visits    Why is this important?   Checking your blood sugar at home helps to keep it from getting very high or very low.  Writing the results in a diary or log helps the doctor know how to care for you.  Your blood sugar log should have the time, date and the results.  Also, write down the amount of insulin or other medicine that you take.  Other information, like what you ate, exercise done and how you were feeling, will also be helpful.     Notes:     . Prevent Falls and Injury       Follow Up Date 07/24/2020    - always use handrails on the stairs - learn how to get back up if I fall - make an emergency alert plan in case I fall - pick up clutter from the floors - use a nonslip pad with throw rugs, or remove them completely - use a cane or walker - use a nightlight in the bathroom - wear my glasses and/or hearing aid    Why is this important?   Most falls happen when it is hard for you to walk safely. Your balance may be off because of  an illness. You may have pain in your knees, hip or other joints.  You may be overly tired or taking medicines that make you sleepy. You may not be able to see or hear clearly.  Falls can lead to broken bones, bruises or other injuries.  There are things you can do to help prevent falling.     Notes:     . Track and Manage My Blood Pressure       Follow Up Date 07/24/2020    - check blood pressure 3 times per week - write blood pressure results in a log or diary    Why is this important?   You won't feel high blood pressure, but it can still hurt your blood vessels.  High blood pressure can cause heart or kidney problems. It can also cause a stroke.  Making lifestyle changes like losing a little weight or eating less salt will help.  Checking your blood pressure at home and at different times of the day can help to control blood pressure.  If the doctor prescribes  medicine remember to take it the way the doctor ordered.  Call the office if you cannot afford the medicine or if there are questions about it.     Notes:         Plan:   The care management team will reach out to the patient again over the next 45 days.   Chong Sicilian, BSN, RN-BC Embedded Chronic Care Manager [facility] / [facility] Direct Dial: [phone]

## 2020-06-06 LAB — URINE CULTURE: Organism ID, Bacteria: NO GROWTH

## 2020-06-07 ENCOUNTER — Telehealth: Payer: Self-pay

## 2020-06-07 NOTE — Telephone Encounter (Signed)
Patient given results of urine culture and verbalized understanding

## 2020-06-16 ENCOUNTER — Other Ambulatory Visit: Payer: Self-pay | Admitting: Family Medicine

## 2020-07-17 ENCOUNTER — Ambulatory Visit: Payer: Medicare Other | Admitting: *Deleted

## 2020-07-17 ENCOUNTER — Other Ambulatory Visit: Payer: Self-pay | Admitting: Family Medicine

## 2020-07-17 DIAGNOSIS — R296 Repeated falls: Secondary | ICD-10-CM

## 2020-07-17 DIAGNOSIS — E1169 Type 2 diabetes mellitus with other specified complication: Secondary | ICD-10-CM

## 2020-07-17 DIAGNOSIS — I10 Essential (primary) hypertension: Secondary | ICD-10-CM

## 2020-07-17 DIAGNOSIS — E785 Hyperlipidemia, unspecified: Secondary | ICD-10-CM

## 2020-07-17 NOTE — Patient Instructions (Signed)
Visit Information  Goals Addressed            This Visit's Progress   . Chronic Disease Management Needs       CARE PLAN ENTRY (see longtitudinal plan of care for additional care plan information)  Current Barriers:  . Chronic Disease Management support, education, and care coordination needs related to HTN, GERD, DM, OA, osteoporosis, HLD, osteoarthritis, macular degeneration, abnormal gait with frequent falls  Clinical Goal(s) related to HTN, GERD, DM, OA, osteoporosis, HLD, osteoarthritis, macular degeneration, abnormal gait with frequent falls:  Over the next 45 days, patient will:  . Work with the care management team to address educational, disease management, and care coordination needs  . Begin or continue self health monitoring activities as directed today Measure and record cbg (blood glucose) 1 times daily and Measure and record blood pressure 4 times per week . Call provider office for new or worsened signs and symptoms Blood glucose findings outside established parameters and Blood pressure findings outside established parameters . Call care management team with questions or concerns  Interventions related to HTN, GERD, DM, OA, osteoporosis, HLD, osteoarthritis, macular degeneration, abnormal gait with frequent falls:  . Evaluation of current treatment plans and patient's adherence to plan as established by provider . Assessed patient's education and care coordination needs . Chart reviewed including recent office notes and telephone notes . Discussed mobility and use of Cane for ambulation . Discussed hx of falls and fall screening performed . Encouraged safety measures and safety devices  o Has a medical alert system and wears it regularly  . Encouraged continued use of cane for all ambulation and use of other assistive devices . Discussed family/social support o Very active in church o Daughters help with anything she needs . Discussed transportation o She drives  locally and daughters drive outside of the local area . Discussed continued frequency, urgency, and nocturia o Encouraged to f/u with PCP o Last urine culture was neg o Suggested that pelvic floor PT could be potentially beneficial but age and length of condition are a big factor in how well it helps . Discussed ability to perform ADLs . Reviewed and discussed upcoming appointments . Encouraged patient to reach out to [place] as needed  Patient Self Care Activities related to HTN, GERD, DM, OA, osteoporosis, HLD, osteoarthritis, macular degeneration, abnormal gait with frequent falls:  . Patient is unable to independently self-manage chronic health conditions  Please see past updates related to this goal by clicking on the "Past Updates" button in the selected goal         The patient verbalized understanding of instructions, educational materials, and care plan provided today and declined offer to receive copy of patient instructions, educational materials, and care plan.   Follow-up Plan Telephone follow up appointment with care management team member scheduled for: 08/16/20 with RN Care Manager The patient has been provided with contact information for the care management team and has been advised to call with any health related questions or concerns.  Next PCP appointment scheduled for: 09/14/20 with Dr Lajuana Ripple    Chong Sicilian, BSN, RN-BC [place] [facility] Direct Dial: [phone]

## 2020-07-17 NOTE — Chronic Care Management (AMB) (Signed)
Chronic Care Management   Follow Up Note   07/17/2020 Name: Debra Phillips MRN: [id number] DOB: 01-02-1929  Referred by: Debra Norlander, DO Reason for referral : Chronic Care Management (RN follow up)   Debra Phillips is a 86 y.o. year old female who is a primary care patient of Debra Norlander, DO. The CCM team was consulted for assistance with chronic disease management and care coordination needs.    Review of patient status, including review of consultants reports, relevant laboratory and other test results, and collaboration with appropriate care team members and the patient's provider was performed as part of comprehensive patient evaluation and provision of chronic care management services.    SDOH (Social Determinants of Health) assessments performed: No See Care Plan activities for detailed interventions related to [facility])     Outpatient Encounter Medications as of 07/17/2020  Medication Sig  . aspirin EC 81 MG tablet Take 81 mg by mouth daily.  . Calcium Carb-Cholecalciferol 600-500 MG-UNIT CAPS Take 1 capsule by mouth every morning.  . Cholecalciferol 1000 UNITS tablet Take 1,000 Units by mouth daily.   Marland Kitchen diltiazem (CARDIZEM CD) 180 MG 24 hr capsule TAKE (1) CAPSULE DAILY  . furosemide (LASIX) 20 MG tablet Take 0.5 tablets (10 mg total) by mouth daily.  . hydroxypropyl methylcellulose / hypromellose (ISOPTO TEARS / GONIOVISC) 2.5 % ophthalmic solution Place 1 drop into both eyes 3 (three) times daily as needed for dry eyes.  . Lancets (ONETOUCH ULTRASOFT) lancets CHECK BLOOD SUGAR ONCE A DAY  . lisinopril (ZESTRIL) 40 MG tablet TAKE 1 TABLET DAILY  . metFORMIN (GLUCOPHAGE) 1000 MG tablet TAKE  (1)  TABLET TWICE A DAY WITH MEALS (BREAKFAST AND SUPPER)  . MYRBETRIQ 25 MG TB24 tablet Take 25 mg by mouth daily.  . nitroGLYCERIN (NITROSTAT) 0.4 MG SL tablet Place 1 tablet (0.4 mg total) under the tongue every 5 (five) minutes as needed for chest pain.  [name]  ULTRA test strip Check BS daily Dx E11.69  . pantoprazole (PROTONIX) 40 MG tablet Take 40 mg by mouth daily.  . pravastatin (PRAVACHOL) 40 MG tablet TAKE 1 TABLET DAILY  . tiZANidine (ZANAFLEX) 4 MG tablet Take 0.5-1 tablets (2-4 mg total) by mouth every 8 (eight) hours as needed for muscle spasms.  . [DISCONTINUED] diltiazem (CARDIZEM CD) 180 MG 24 hr capsule TAKE (1) CAPSULE DAILY   No facility-administered encounter medications on file as of 07/17/2020.     Goals Addressed            This Visit's Progress   . Chronic Disease Management Needs       CARE PLAN ENTRY (see longtitudinal plan of care for additional care plan information)  Current Barriers:  . Chronic Disease Management support, education, and care coordination needs related to HTN, GERD, DM, OA, osteoporosis, HLD, osteoarthritis, macular degeneration, abnormal gait with frequent falls  Clinical Goal(s) related to HTN, GERD, DM, OA, osteoporosis, HLD, osteoarthritis, macular degeneration, abnormal gait with frequent falls:  Over the next 45 days, patient will:  . Work with the care management team to address educational, disease management, and care coordination needs  . Begin or continue self health monitoring activities as directed today Measure and record cbg (blood glucose) 1 times daily and Measure and record blood pressure 4 times per week . Call provider office for new or worsened signs and symptoms Blood glucose findings outside established parameters and Blood pressure findings outside established parameters . Call care management team with  questions or concerns  Interventions related to HTN, GERD, DM, OA, osteoporosis, HLD, osteoarthritis, macular degeneration, abnormal gait with frequent falls:  . Evaluation of current treatment plans and patient's adherence to plan as established by provider . Assessed patient's education and care coordination needs . Chart reviewed including recent office notes and telephone  notes . Discussed mobility and use of Cane for ambulation . Discussed hx of falls and fall screening performed . Encouraged safety measures and safety devices  o Has a medical alert system and wears it regularly  . Encouraged continued use of cane for all ambulation and use of other assistive devices . Discussed family/social support o Very active in church o Daughters help with anything she needs . Discussed transportation o She drives locally and daughters drive outside of the local area . Discussed continued frequency, urgency, and nocturia o Encouraged to f/u with PCP o Last urine culture was neg o Suggested that pelvic floor PT could be potentially beneficial but age and length of condition are a big factor in how well it helps . Discussed ability to perform ADLs . Reviewed and discussed upcoming appointments . Encouraged patient to reach out to [place] as needed  Patient Self Care Activities related to HTN, GERD, DM, OA, osteoporosis, HLD, osteoarthritis, macular degeneration, abnormal gait with frequent falls:  . Patient is unable to independently self-manage chronic health conditions  Please see past updates related to this goal by clicking on the "Past Updates" button in the selected goal           Plan:   Telephone follow up appointment with care management team member scheduled for: 08/16/20 with RN Care Manager The patient has been provided with contact information for the care management team and has been advised to call with any health related questions or concerns.  Next PCP appointment scheduled for: 09/14/20 with Dr Debra Phillips    Debra Phillips, BSN, RN-BC [place] [facility] [place] Direct Dial: [phone]

## 2020-08-14 ENCOUNTER — Ambulatory Visit (INDEPENDENT_AMBULATORY_CARE_PROVIDER_SITE_OTHER): Payer: Medicare Other

## 2020-08-14 ENCOUNTER — Ambulatory Visit (INDEPENDENT_AMBULATORY_CARE_PROVIDER_SITE_OTHER): Payer: Medicare Other | Admitting: Family

## 2020-08-14 ENCOUNTER — Encounter: Payer: Self-pay | Admitting: Family

## 2020-08-14 VITALS — BP 158/85 | HR 82 | Temp 97.6°F | Ht 64.0 in | Wt 154.2 lb

## 2020-08-14 DIAGNOSIS — S300XXA Contusion of lower back and pelvis, initial encounter: Secondary | ICD-10-CM

## 2020-08-14 DIAGNOSIS — W19XXXA Unspecified fall, initial encounter: Secondary | ICD-10-CM | POA: Diagnosis not present

## 2020-08-14 DIAGNOSIS — S3992XA Unspecified injury of lower back, initial encounter: Secondary | ICD-10-CM | POA: Diagnosis not present

## 2020-08-14 DIAGNOSIS — Y92009 Unspecified place in unspecified non-institutional (private) residence as the place of occurrence of the external cause: Secondary | ICD-10-CM

## 2020-08-14 NOTE — Patient Instructions (Addendum)
Contusion A contusion is a deep bruise. Contusions are the result of a blunt injury to tissues and muscle fibers under the skin. The injury causes bleeding under the skin. The skin overlying the contusion may turn blue, purple, or yellow. Minor injuries will give you a painless contusion, but more severe injuries cause contusions that may stay painful and swollen for a few weeks. Follow these instructions at home: Pay attention to any changes in your symptoms. Let your health care provider know about them. Take these actions to relieve your pain. Managing pain, stiffness, and swelling   Use resting, icing, applying pressure (compression), and raising (elevating) the injured area. This is often called the RICE strategy. ? Rest the injured area. Return to your normal activities as told by your health care provider. Ask your health care provider what activities are safe for you. ? If directed, put ice on the injured area:  Put ice in a plastic bag.  Place a towel between your skin and the bag.  Leave the ice on for 20 minutes, 2-3 times per day. ? If directed, apply light compression to the injured area using an elastic bandage. Make sure the bandage is not wrapped too tightly. Remove and reapply the bandage as directed by your health care provider. ? If possible, raise (elevate) the injured area above the level of your heart while you are sitting or lying down. General instructions  Take over-the-counter and prescription medicines only as told by your health care provider.  Keep all follow-up visits as told by your health care provider. This is important. Contact a health care provider if:  Your symptoms do not improve after several days of treatment.  Your symptoms get worse.  You have difficulty moving the injured area. Get help right away if:  You have severe pain.  You have numbness in a hand or foot.  Your hand or foot turns pale or cold. Summary  A contusion is a deep  bruise.  Contusions are the result of a blunt injury to tissues and muscle fibers under the skin.  It is treated with rest, ice, compression, and elevation. You may be given over-the-counter medicines for pain.  Contact a health care provider if your symptoms do not improve, or get worse.  Get help right away if you have severe pain, have numbness, or the area turns pale or cold. This information is not intended to replace advice given to you by your health care provider. Make sure you discuss any questions you have with your health care provider. Document Revised: 04/01/2018 Document Reviewed: 04/01/2018 Elsevier Patient Education  [place].

## 2020-08-14 NOTE — Progress Notes (Signed)
Subjective:    Patient ID: Debra Phillips, female    DOB: 1929/01/03, 86 y.o.   MRN: [id number]  Chief Complaint  Patient presents with  . Fall    Patient states she had a fall on Saturday and she can not sit down on her bottom due to pain.    Pt presents to the office today with pain in buttocks  after she fell Saturday. She reports she was sitting in a chair and lean forward and her chair "came out from under me".  Fall The accident occurred 3 to 5 days ago. She landed on hard floor. There was no blood loss. The point of impact was the buttocks. The pain is present in the buttocks. The pain is at a severity of 9/10. The pain is moderate. The symptoms are aggravated by standing and sitting. Pertinent negatives include no loss of consciousness, numbness, tingling or visual change. She has tried rest for the symptoms. The treatment provided mild relief.     Review of Systems  Neurological: Negative for tingling, loss of consciousness and numbness.  All other systems reviewed and are negative.      Objective:   Physical Exam Vitals reviewed.  Constitutional:      General: She is not in acute distress.    Appearance: She is well-developed and well-nourished.  HENT:     Head: Normocephalic and atraumatic.     Mouth/Throat:     Mouth: Oropharynx is clear and moist.  Eyes:     Pupils: Pupils are equal, round, and reactive to light.  Neck:     Thyroid: No thyromegaly.  Cardiovascular:     Rate and Rhythm: Normal rate and regular rhythm.     Pulses: Intact distal pulses.     Heart sounds: Normal heart sounds. No murmur heard.   Pulmonary:     Effort: Pulmonary effort is normal. No respiratory distress.     Breath sounds: Normal breath sounds. No wheezing.  Abdominal:     General: Bowel sounds are normal. There is no distension.     Palpations: Abdomen is soft.     Tenderness: There is no abdominal tenderness.  Musculoskeletal:        General: No tenderness or edema.  Normal range of motion.     Cervical back: Normal range of motion and neck supple.  Skin:    General: Skin is warm and dry.     Findings: Ecchymosis present.          Comments: Ecchymosis present on left lower buttocks, tenderness present.   Neurological:     Mental Status: She is alert and oriented to person, place, and time.     Cranial Nerves: No cranial nerve deficit.     Deep Tendon Reflexes: Reflexes are normal and symmetric.  Psychiatric:        Mood and Affect: Mood and affect normal.        Behavior: Behavior normal.        Thought Content: Thought content normal.        Judgment: Judgment normal.     X-ray- Negative for fracture, Preliminary reading by Evelina Dun, FNP [facility]   BP (!) 158/85   Pulse 82   Temp 97.6 F (36.4 C) (Temporal)   Ht 5\' 4"  (1.626 m)   Wt 154 lb 3.2 oz (69.9 kg)   SpO2 99%   BMI 26.47 kg/m      Assessment & Plan:  Debra Phillips comes in  today with chief complaint of Fall (Patient states she had a fall on Saturday and she can not sit down on her bottom due to pain./)   Diagnosis and orders addressed:  1. Fall in home, initial encounter - DG HIP UNILAT W OR W/O PELVIS 2-3 VIEWS LEFT; Future  2. Injury of coccyx, initial encounter  3. Contusion of buttock, initial encounter  No fracture noted  Tylenol as needed Discussed using a pillow to sit  Call if symptoms worsen or do not improve   Jannifer Rodney, FNP

## 2020-08-16 ENCOUNTER — Ambulatory Visit: Payer: Medicare Other | Admitting: *Deleted

## 2020-08-16 DIAGNOSIS — E1169 Type 2 diabetes mellitus with other specified complication: Secondary | ICD-10-CM

## 2020-08-16 DIAGNOSIS — M1990 Unspecified osteoarthritis, unspecified site: Secondary | ICD-10-CM

## 2020-08-16 DIAGNOSIS — S300XXA Contusion of lower back and pelvis, initial encounter: Secondary | ICD-10-CM

## 2020-08-16 DIAGNOSIS — R296 Repeated falls: Secondary | ICD-10-CM

## 2020-08-16 NOTE — Chronic Care Management (AMB) (Signed)
Chronic Care Management   Follow Up Note   08/16/2020 Name: Debra Phillips MRN: [id number] DOB: 1929/05/10  Referred by: [name], DO Reason for referral : Chronic Care Management (RN follow up)   Debra Phillips is a 86 y.o. year old female who is a primary care patient of [name], DO. The CCM team was consulted for assistance with chronic disease management and care coordination needs.    Review of patient status, including review of consultants reports, relevant laboratory and other test results, and collaboration with appropriate care team members and the patient's provider was performed as part of comprehensive patient evaluation and provision of chronic care management services.    SDOH (Social Determinants of Health) assessments performed: No See Care Plan activities for detailed interventions related to [facility])     Outpatient Encounter Medications as of 08/16/2020  Medication Sig  . aspirin EC 81 MG tablet Take 81 mg by mouth daily.  . Calcium Carb-Cholecalciferol 600-500 MG-UNIT CAPS Take 1 capsule by mouth every morning.  . Cholecalciferol 1000 UNITS tablet Take 1,000 Units by mouth daily.  Marland Kitchen diltiazem (CARDIZEM CD) 180 MG 24 hr capsule TAKE (1) CAPSULE DAILY  . furosemide (LASIX) 20 MG tablet Take 0.5 tablets (10 mg total) by mouth daily.  . hydroxypropyl methylcellulose / hypromellose (ISOPTO TEARS / GONIOVISC) 2.5 % ophthalmic solution Place 1 drop into both eyes 3 (three) times daily as needed for dry eyes.  . Lancets (ONETOUCH ULTRASOFT) lancets CHECK BLOOD SUGAR ONCE A DAY  . lisinopril (ZESTRIL) 40 MG tablet TAKE 1 TABLET DAILY  . metFORMIN (GLUCOPHAGE) 1000 MG tablet TAKE  (1)  TABLET TWICE A DAY WITH MEALS (BREAKFAST AND SUPPER)  . MYRBETRIQ 25 MG TB24 tablet Take 25 mg by mouth daily.  . nitroGLYCERIN (NITROSTAT) 0.4 MG SL tablet Place 1 tablet (0.4 mg total) under the tongue every 5 (five) minutes as needed for chest pain.  [name]  ULTRA test strip Check BS daily Dx E11.69  . pantoprazole (PROTONIX) 40 MG tablet Take 40 mg by mouth daily.  . pravastatin (PRAVACHOL) 40 MG tablet TAKE 1 TABLET DAILY  . tiZANidine (ZANAFLEX) 4 MG tablet Take 0.5-1 tablets (2-4 mg total) by mouth every 8 (eight) hours as needed for muscle spasms.   No facility-administered encounter medications on file as of 08/16/2020.       Patient Care Plan: Diabetes Type 2 (Adult)    Problem Identified: Glycemic Management (Diabetes, Type 2)   Priority: Medium    Long-Range Goal: Glycemic Management Optimized   This Visit's Progress: Not on track  Priority: Medium  Note:   Current Barriers:  . Chronic Disease Management support and education needs related to diabetes . Decreased mobility due to chronic back pain and pain in multiple joints  Nurse Case Manager Clinical Goal(s):  Marland Kitchen Over the next 30 days, patient will verbalize understanding of plan for diabetes management . Over the next 30 days, patient will work with Consulting civil engineer to address needs related to self-management of diabetes  Interventions:  . 1:1 collaboration with [name], DO regarding development and update of comprehensive plan of care as evidenced by provider attestation and co-signature . Inter-disciplinary care team collaboration (see longitudinal plan of care) . Evaluation of current treatment plan related to diabetes and patient's adherence to plan as established by provider. . Chart reviewed including relevant office notes and lab results . Discussed recent A1C result . Discussed home blood sugar testing o  Tests daily o Ranges from 100-130 most of the time . Discussed diet and activity level o Activity limited by chronic back pain and arthritis . Discussed family/social support o Lives alone o Has help from daughters as needed . Encouraged to check blood sugar daily and call PCP with any readings outside of recommended range . Provided with RN Care  Manager contact number and encouraged to reach out as needed  Patient Goals/Self-Care Activities Over the next 30 days, patient will:  - Patient will self administer medications as prescribed Patient will call pharmacy for medication refills Patient will continue to perform ADL's independently Patient will call provider office for new concerns or questions     Patient Care Plan: Osteoarthritis (Adult)    Problem Identified: Mobility and Function (Osteoarthritis)   Priority: High    Problem Identified: Maintain Mobility and Prevent Harm/Injury     Long-Range Goal: Harm/Injury Prevented and Mobility Maintained   This Visit's Progress: Not on track  Priority: High  Note:   Current Barriers:  . Care Coordination needs related to recent fall resulting in hip pain in a patient with osteoarthritis. . Decreased mobility and increased pain  Nurse Case Manager Clinical Goal(s):  Marland Kitchen Over the next 7 days, patient will work with Medical illustrator to address needs related to self-management of pain related to fall and fall prevention strategies . Over the next 30 days, patient will not experience any additional falls . Over the next 14 days, patient will reach out to PCP or seek emergency medical care for new or worsening symptoms  Interventions:  . 1:1 collaboration with Raliegh Ip, DO regarding development and update of comprehensive plan of care as evidenced by provider attestation and co-signature . Inter-disciplinary care team collaboration (see longitudinal plan of care) . Evaluation of current treatment plan related to osteoarthritis and pain due to recent fall and patient's adherence to plan as established by provider. . Relevant office notes and imaging reports reviewed . Discussed recent fall with patient and subsequent left hip pain o Fall happened about 1 week ago o Evaluated in office on 08/14/20 o Hip/pelvis xray negative for fracture or acute process . Discussed pain  management o Ice or heat o Rest o Tylenol as needed . Discussed fall risk and fall prevention strategies o Encouraged to change positions slowly o Wear non-slip shoes o Remove throw rugs  o Have spatial awareness o Use lights at night . Provided with RN Care Manager contact number and encouraged to reach out as needed . Encouraged to reach out to PCP or seek emergency medical attention if necessary for any new or worsening symptoms or if symptoms do not improve  Patient Goals/Self-Care Activities Over the next 7 days, patient will: . Patient will self administer medications as prescribed . Patient will continue to perform ADL's independently . Patient will call provider office for new concerns or questions . Patient will use ice or heat for pain relief          Follow Up Plan: Telephone follow up appointment with care management team member scheduled for: RN Care Manager on 08/21/20 The patient has been provided with contact information for the care management team and has been advised to call with any health related questions or concerns.  Next PCP appointment scheduled for: 09/14/20 with Dr Nadine Counts  Demetrios Loll, BSN, RN-BC Embedded Chronic Care Manager [facility] Care Management Direct Dial: [phone]

## 2020-08-16 NOTE — Patient Instructions (Signed)
Visit Information  Goals Addressed            This Visit's Progress   . Cope with Pain-Osteoarthritis       Follow Up Date 08/21/2020    - learn relaxation techniques - use distraction techniques - use relaxation during pain  - take medication as prescribed - use ice or heat   Why is this important?    Living with joint pain and enjoying your life may be hard.   Feelings like depression or anger can make your pain worse.   Learning ways to cope may help you find some relief from the pain.    Notes:        The patient verbalized understanding of instructions, educational materials, and care plan provided today and declined offer to receive copy of patient instructions, educational materials, and care plan.   Follow Up Plan: Telephone follow up appointment with care management team member scheduled for: RN Care Manager on 08/21/20 The patient has been provided with contact information for the care management team and has been advised to call with any health related questions or concerns.  Next PCP appointment scheduled for: 09/14/20 with Dr Lajuana Ripple  Chong Sicilian, BSN, RN-BC [place] [facility] [place] Management Direct Dial: [phone]

## 2020-08-21 ENCOUNTER — Ambulatory Visit: Payer: Medicare Other | Admitting: *Deleted

## 2020-08-21 DIAGNOSIS — E785 Hyperlipidemia, unspecified: Secondary | ICD-10-CM

## 2020-08-21 DIAGNOSIS — M1990 Unspecified osteoarthritis, unspecified site: Secondary | ICD-10-CM

## 2020-08-21 NOTE — Chronic Care Management (AMB) (Signed)
Chronic Care Management   Follow Up Note   08/21/2020 Name: Debra Phillips MRN: [id number] DOB: June 20, 1929  Referred by: Raliegh Ip, DO Reason for referral : Chronic Care Management (RN Follow up)   Debra Phillips is a 86 y.o. year old female who is a primary care patient of Raliegh Ip, DO. The CCM team was consulted for assistance with chronic disease management and care coordination needs.    Review of patient status, including review of consultants reports, relevant laboratory and other test results, and collaboration with appropriate care team members and the patient's provider was performed as part of comprehensive patient evaluation and provision of chronic care management services.    SDOH (Social Determinants of Health) assessments performed: No See Care Plan activities for detailed interventions related to [facility])     Outpatient Encounter Medications as of 08/21/2020  Medication Sig  . aspirin EC 81 MG tablet Take 81 mg by mouth daily.  . Calcium Carb-Cholecalciferol 600-500 MG-UNIT CAPS Take 1 capsule by mouth every morning.  . Cholecalciferol 1000 UNITS tablet Take 1,000 Units by mouth daily.  Marland Kitchen diltiazem (CARDIZEM CD) 180 MG 24 hr capsule TAKE (1) CAPSULE DAILY  . furosemide (LASIX) 20 MG tablet Take 0.5 tablets (10 mg total) by mouth daily.  . hydroxypropyl methylcellulose / hypromellose (ISOPTO TEARS / GONIOVISC) 2.5 % ophthalmic solution Place 1 drop into both eyes 3 (three) times daily as needed for dry eyes.  . Lancets (ONETOUCH ULTRASOFT) lancets CHECK BLOOD SUGAR ONCE A DAY  . lisinopril (ZESTRIL) 40 MG tablet TAKE 1 TABLET DAILY  . metFORMIN (GLUCOPHAGE) 1000 MG tablet TAKE  (1)  TABLET TWICE A DAY WITH MEALS (BREAKFAST AND SUPPER)  . MYRBETRIQ 25 MG TB24 tablet Take 25 mg by mouth daily.  . nitroGLYCERIN (NITROSTAT) 0.4 MG SL tablet Place 1 tablet (0.4 mg total) under the tongue every 5 (five) minutes as needed for chest pain.  [name]  ULTRA test strip Check BS daily Dx E11.69  . pantoprazole (PROTONIX) 40 MG tablet Take 40 mg by mouth daily.  . pravastatin (PRAVACHOL) 40 MG tablet TAKE 1 TABLET DAILY  . tiZANidine (ZANAFLEX) 4 MG tablet Take 0.5-1 tablets (2-4 mg total) by mouth every 8 (eight) hours as needed for muscle spasms.   No facility-administered encounter medications on file as of 08/21/2020.       Patient Care Plan: Diabetes Type 2 (Adult)    Problem Identified: Glycemic Management (Diabetes, Type 2)   Priority: Medium    Long-Range Goal: Glycemic Management Optimized   Recent Progress: Not on track  Priority: Medium  Note:   Current Barriers:  . Chronic Disease Management support and education needs related to diabetes . Decreased mobility due to chronic back pain and pain in multiple joints  Nurse Case Manager Clinical Goal(s):  Marland Kitchen Over the next 30 days, patient will verbalize understanding of plan for diabetes management . Over the next 30 days, patient will work with Medical illustrator to address needs related to self-management of diabetes  Interventions:  . 1:1 collaboration with Raliegh Ip, DO regarding development and update of comprehensive plan of care as evidenced by provider attestation and co-signature . Inter-disciplinary care team collaboration (see longitudinal plan of care) . Evaluation of current treatment plan related to diabetes and patient's adherence to plan as established by provider. . Chart reviewed including relevant office notes and lab results . Discussed recent A1C result . Discussed home blood sugar testing o Not  testing daily o Ranges from 100-130 when she does check it o Reports "feeling funny" today and blood sugar was 268 after breakfast o Down to 116 now and feels better . Reviewed and discussed medications o Takes Metformin regularly . Previously discussed diet and activity level o Activity limited by chronic back pain and arthritis . Previously discussed  family/social support o Lives alone o Has help from daughters as needed . Encouraged to check and record blood sugar daily and PRN and call PCP with any readings outside of recommended range . Provided with RN Care Manager contact number and encouraged to reach out as needed  Patient Goals/Self-Care Activities Over the next 30 days, patient will: . Patient will self administer medications as prescribed . Patient will call pharmacy for medication refills . Patient will continue to perform ADL's independently . Patient will call provider office for new concerns or questions . Patient will check and record blood sugar daily . Patient will call PCP with any readings outside of recommended range  Follow Up Plan:  . The patient has been provided with contact information for the care management team and has been advised to call with any health related questions or concerns.  . The care management team will reach out to the patient again over the next 30 days.  Marland Kitchen Next PCP appointment scheduled for: 09/14/20 with Dr Lajuana Ripple       Patient Care Plan: Osteoarthritis (Adult)    Problem Identified: Mobility and Function (Osteoarthritis)   Priority: High    Problem Identified: Maintain Mobility and Prevent Harm/Injury     Long-Range Goal: Harm/Injury Prevented and Mobility Maintained   Recent Progress: Not on track  Priority: High  Note:   Current Barriers:  . Care Coordination needs related to recent fall resulting in hip pain in a patient with osteoarthritis. . Decreased mobility and increased pain  Nurse Case Manager Clinical Goal(s):  Marland Kitchen Over the next 30 days, patient will have follow-up appointment with PCP . Over the next 30 days, patient will not experience any additional falls . Over the next 30 days, patient will reach out to PCP or seek emergency medical care for new or worsening symptoms  Interventions:  . 1:1 collaboration with [name], DO regarding development  and update of comprehensive plan of care as evidenced by provider attestation and co-signature . Inter-disciplinary care team collaboration (see longitudinal plan of care) . Evaluation of current treatment plan related to osteoarthritis and pain due to recent fall and patient's adherence to plan as established by provider. . Previously reviewed relevant office notes and imaging reports . Previously discussed recent fall with patient and subsequent left hip pain o Fall happened about 1 week ago o Evaluated in office on 08/14/20 o Hip/pelvis xray negative for fracture or acute process . Discussed pain and pain management o Pain is improving but has not completely resolved o Bruising present. Advised that it will take some time for that to go away o Taking Tylenol and using ice/heat . Previously discussed fall risk and fall prevention strategies o Encouraged to change positions slowly o Wear non-slip shoes o Remove throw rugs  o Have spatial awareness o Use lights at night . Provided with RN Care Manager contact number and encouraged to reach out as needed . Encouraged to reach out to PCP or seek emergency medical attention if necessary for any new or worsening symptoms or if symptoms do not improve  Patient Goals/Self-Care Activities Over the next 30 days,  patient will: . Patient will self administer medications as prescribed . Patient will continue to perform ADL's independently . Patient will call provider office for new concerns or questions . Keep appointment with PCP on 09/14/20  Follow Up Plan:  . Telephone follow up appointment with care management team member scheduled for: RN Care Manager 09/05/2020 . The patient has been provided with contact information for the care management team and has been advised to call with any health related questions or concerns.  . Next PCP appointment scheduled for: 09/14/20 with Dr Lajuana Ripple         Plan: Telephone follow up appointment  with care management team member scheduled for: 09/05/2020 with RN Care Manager The patient has been provided with contact information for the care management team and has been advised to call with any health related questions or concerns.  Follow up with provider re: blood sugar readings outside of recommended range Next PCP appointment scheduled for:  09/14/20 with Dr Lajuana Ripple   Chong Sicilian, BSN, RN-BC [place] [facility] [place] Management Direct Dial: [phone]

## 2020-08-21 NOTE — Patient Instructions (Signed)
Visit Information  Goals Addressed            This Visit's Progress   . Cope with Pain-Osteoarthritis        - learn relaxation techniques - use distraction techniques - use relaxation during pain  - take medication as prescribed - use ice or heat - call PCP with any new or worsening pain   Why is this important?    Living with joint pain and enjoying your life may be hard.   Feelings like depression or anger can make your pain worse.   Learning ways to cope may help you find some relief from the pain.    Notes:     Marland Kitchen Monitor and Manage My Blood Sugar          - check blood sugar at prescribed times - check blood sugar if I feel it is too high or too low - take the blood sugar log to all doctor visits - take the blood sugar meter to all doctor visits  - call PCP with any readings outside of recommended range          The patient verbalized understanding of instructions, educational materials, and care plan provided today and declined offer to receive copy of patient instructions, educational materials, and care plan.   Follow-up Plan: Telephone follow up appointment with care management team member scheduled for: 09/05/2020 with RN Care Manager The patient has been provided with contact information for the care management team and has been advised to call with any health related questions or concerns.  Follow up with provider re: blood sugar readings outside of recommended range Next PCP appointment scheduled for:  09/14/20 with Dr Nadine Counts   Demetrios Loll, BSN, RN-BC Embedded Chronic Care Manager [facility] / [facility] Care Management Direct Dial: [phone]

## 2020-09-05 ENCOUNTER — Telehealth: Payer: Medicare Other

## 2020-09-12 NOTE — Progress Notes (Signed)
Telephone visit  Subjective: CC: DM PCP: [name], DO BXU:[id number] CODEE BLOODWORTH is a 87 y.o. female calls for telephone consult today. Patient provides verbal consent for consult held via phone.  Due to COVID-19 pandemic this visit was conducted virtually. This visit type was conducted due to national recommendations for restrictions regarding the COVID-19 Pandemic (e.g. social distancing, sheltering in place) in an effort to limit this patient's exposure and mitigate transmission in our community. All issues noted in this document were discussed and addressed.  A physical exam was not performed with this format.   Location of patient: home Location of provider: [facility] Others present for call: none  1. DM w/ HTN, HLD Sugar is up and down.  She reports BG on 1/19 was 277 at 6pm.  Her FBG this am was 163.  Lab Results  Component Value Date   HGBA1C 7.2 (H) 05/14/2020   She reports arthritis.  Denies CP, SOB, LOC, dizziness.  2. mixed urge and stress incontinence Patient reports ongoing urinary incontinence.  Sometimes she will have bowel incontinence as well.  She is not sure she is taking Myrbetriq.  No dysuria  ROS: Per HPI  Allergies  Allergen Reactions  . Codeine Nausea And Vomiting  . Doxycycline Hives  . Other Nausea Only    Almost all antibiotics  . Sulfonamide Derivatives Hives  . Vancomycin Itching   Past Medical History:  Diagnosis Date  . Abnormality of gait 01/29/2016  . Aortic root dilatation (HCC)    Mild  . Cataract   . Diabetes mellitus without complication ([place])    type 2  . Dyslipidemia   . Dyspnea    with exertion  . Dysrhythmia    hx of occ palpitations  . Esophageal stricture    s/p dilation  . GERD (gastroesophageal reflux disease)   . Headache   . History of hiatal hernia   . Hyperlipidemia   . Hypertension   . Macular pucker, left eye   . Osteoarthritis   . Osteoporosis   . Tinnitus    left ear last week or so    Current  Outpatient Medications:  .  aspirin EC 81 MG tablet, Take 81 mg by mouth daily., Disp: , Rfl:  .  Calcium Carb-Cholecalciferol 600-500 MG-UNIT CAPS, Take 1 capsule by mouth every morning., Disp: , Rfl:  .  Cholecalciferol 1000 UNITS tablet, Take 1,000 Units by mouth daily., Disp: , Rfl:  .  diltiazem (CARDIZEM CD) 180 MG 24 hr capsule, TAKE (1) CAPSULE DAILY, Disp: 90 capsule, Rfl: 0 .  furosemide (LASIX) 20 MG tablet, Take 0.5 tablets (10 mg total) by mouth daily., Disp: 30 tablet, Rfl: 3 .  hydroxypropyl methylcellulose / hypromellose (ISOPTO TEARS / GONIOVISC) 2.5 % ophthalmic solution, Place 1 drop into both eyes 3 (three) times daily as needed for dry eyes., Disp: , Rfl:  .  Lancets (ONETOUCH ULTRASOFT) lancets, CHECK BLOOD SUGAR ONCE A DAY, Disp: 50 each, Rfl: 9 .  lisinopril (ZESTRIL) 40 MG tablet, TAKE 1 TABLET DAILY, Disp: 90 tablet, Rfl: 1 .  metFORMIN (GLUCOPHAGE) 1000 MG tablet, TAKE  (1)  TABLET TWICE A DAY WITH MEALS (BREAKFAST AND SUPPER), Disp: 180 tablet, Rfl: 0 .  MYRBETRIQ 25 MG TB24 tablet, Take 25 mg by mouth daily., Disp: , Rfl:  .  nitroGLYCERIN (NITROSTAT) 0.4 MG SL tablet, Place 1 tablet (0.4 mg total) under the tongue every 5 (five) minutes as needed for chest pain., Disp: 25 tablet, Rfl: 3 .  ONETOUCH ULTRA test strip, Check BS daily Dx E11.69, Disp: 100 strip, Rfl: 3 .  pantoprazole (PROTONIX) 40 MG tablet, Take 40 mg by mouth daily., Disp: , Rfl:  .  pravastatin (PRAVACHOL) 40 MG tablet, TAKE 1 TABLET DAILY, Disp: 90 tablet, Rfl: 1 .  tiZANidine (ZANAFLEX) 4 MG tablet, Take 0.5-1 tablets (2-4 mg total) by mouth every 8 (eight) hours as needed for muscle spasms., Disp: 30 tablet, Rfl: 0  Assessment/ Plan: 87 y.o. female   Type 2 diabetes mellitus with hyperlipidemia ([place]) - Plan: CMP14+EGFR, Bayer DCA Hb A1c Waived, metFORMIN (GLUCOPHAGE) 1000 MG tablet, pravastatin (PRAVACHOL) 40 MG tablet  Hypertension associated with diabetes ([place]) - Plan: CMP14+EGFR, diltiazem  (CARDIZEM CD) 180 MG 24 hr capsule  Mixed stress and urge urinary incontinence  Worried that sugar is going to be grossly uncontrolled given the reports of blood sugars she has had at home.  I placed future orders for her to come in and have these labs drawn.  Have also asked that she bring her medications so that we can reconcile them and make sure that we have got the appropriate things available to her.  I renewed some of her medicines today.  She has Myrbetriq listed amongst her medications but she is not sure that she is taking it.  Again reconciliation needed as above.  If her blood pressure is well controlled, we can consider advancing the Myrbetriq to 50 mg if she is in fact taking the 25 mg already  Start time: 10:20am End time: 10:28am  Total time spent on patient care (including telephone call/ virtual visit): 8 minutes  [place], [place] [phone]

## 2020-09-14 ENCOUNTER — Ambulatory Visit (INDEPENDENT_AMBULATORY_CARE_PROVIDER_SITE_OTHER): Payer: Medicare Other | Admitting: Family Medicine

## 2020-09-14 ENCOUNTER — Other Ambulatory Visit: Payer: Self-pay | Admitting: *Deleted

## 2020-09-14 ENCOUNTER — Encounter: Payer: Self-pay | Admitting: Family Medicine

## 2020-09-14 DIAGNOSIS — E1159 Type 2 diabetes mellitus with other circulatory complications: Secondary | ICD-10-CM | POA: Diagnosis not present

## 2020-09-14 DIAGNOSIS — I152 Hypertension secondary to endocrine disorders: Secondary | ICD-10-CM | POA: Diagnosis not present

## 2020-09-14 DIAGNOSIS — E1169 Type 2 diabetes mellitus with other specified complication: Secondary | ICD-10-CM | POA: Diagnosis not present

## 2020-09-14 DIAGNOSIS — E785 Hyperlipidemia, unspecified: Secondary | ICD-10-CM

## 2020-09-14 DIAGNOSIS — N3946 Mixed incontinence: Secondary | ICD-10-CM | POA: Diagnosis not present

## 2020-09-14 LAB — BAYER DCA HB A1C WAIVED: HB A1C (BAYER DCA - WAIVED): 7.4 % — ABNORMAL HIGH (ref <7.0)

## 2020-09-14 MED ORDER — METFORMIN HCL 1000 MG PO TABS: ORAL_TABLET | ORAL | 3 refills | Status: DC

## 2020-09-14 MED ORDER — PRAVASTATIN SODIUM 40 MG PO TABS: 40.0000 mg | ORAL_TABLET | Freq: Every day | ORAL | 3 refills | Status: DC

## 2020-09-14 MED ORDER — DILTIAZEM HCL ER COATED BEADS 180 MG PO CP24: 180.0000 mg | ORAL_CAPSULE | Freq: Every day | ORAL | 3 refills | Status: DC

## 2020-09-14 NOTE — Addendum Note (Signed)
Addended by: Zannie Cove on: 09/14/2020 12:40 PM   Modules accepted: Orders

## 2020-09-15 LAB — CMP14+EGFR
ALT: 10 IU/L (ref 0–32)
AST: 14 IU/L (ref 0–40)
Albumin/Globulin Ratio: 2.4 — ABNORMAL HIGH (ref 1.2–2.2)
Albumin: 4.3 g/dL (ref 3.6–4.6)
Alkaline Phosphatase: 69 IU/L (ref 44–121)
BUN/Creatinine Ratio: 17 (ref 12–28)
BUN: 13 mg/dL (ref 8–27)
Bilirubin Total: 0.2 mg/dL (ref 0.0–1.2)
CO2: 24 mmol/L (ref 20–29)
Calcium: 9.7 mg/dL (ref 8.7–10.3)
Chloride: 100 mmol/L (ref 96–106)
Creatinine, Ser: 0.75 mg/dL (ref 0.57–1.00)
GFR calc Af Amer: 82 mL/min/{1.73_m2} (ref >59)
GFR calc non Af Amer: 71 mL/min/{1.73_m2} (ref >59)
Globulin, Total: 1.8 g/dL (ref 1.5–4.5)
Glucose: 195 mg/dL — ABNORMAL HIGH (ref 65–99)
Potassium: 4.5 mmol/L (ref 3.5–5.2)
Sodium: 139 mmol/L (ref 134–144)
Total Protein: 6.1 g/dL (ref 6.0–8.5)

## 2020-09-17 ENCOUNTER — Telehealth: Payer: Self-pay

## 2020-09-26 ENCOUNTER — Telehealth: Payer: Medicare Other | Admitting: *Deleted

## 2020-10-08 ENCOUNTER — Encounter: Payer: Self-pay | Admitting: Family

## 2020-10-08 ENCOUNTER — Other Ambulatory Visit: Payer: Self-pay

## 2020-10-08 ENCOUNTER — Ambulatory Visit (INDEPENDENT_AMBULATORY_CARE_PROVIDER_SITE_OTHER): Payer: Medicare Other | Admitting: Family

## 2020-10-08 VITALS — BP 139/80 | HR 80 | Temp 97.5°F | Ht 64.0 in | Wt 155.0 lb

## 2020-10-08 DIAGNOSIS — R296 Repeated falls: Secondary | ICD-10-CM

## 2020-10-08 DIAGNOSIS — R531 Weakness: Secondary | ICD-10-CM | POA: Diagnosis not present

## 2020-10-08 DIAGNOSIS — E785 Hyperlipidemia, unspecified: Secondary | ICD-10-CM | POA: Diagnosis not present

## 2020-10-08 DIAGNOSIS — M546 Pain in thoracic spine: Secondary | ICD-10-CM | POA: Diagnosis not present

## 2020-10-08 DIAGNOSIS — R7309 Other abnormal glucose: Secondary | ICD-10-CM | POA: Diagnosis not present

## 2020-10-08 DIAGNOSIS — E1169 Type 2 diabetes mellitus with other specified complication: Secondary | ICD-10-CM

## 2020-10-08 LAB — GLUCOSE HEMOCUE WAIVED: Glu Hemocue Waived: 119 mg/dL — ABNORMAL HIGH (ref 65–99)

## 2020-10-08 NOTE — Progress Notes (Signed)
Subjective:    Patient ID: Debra Phillips, female    DOB: [birth year], 87 y.o.   MRN: [id number]  Chief Complaint  Patient presents with  . Diabetes  . Chest Pain  . Shoulder Pain    Under right shoulder/rib pain when takes deep breath just started this morning    PT presents to the office today with elevated blood glucose at home. She reports in the AM her blood glucose 180's. However, later in the day after lunch and dinner it can be 250's. She is very worried about this.   She is also complaining of right thoracic back and right shoulder pain that started today. She uses a cane and uses right arm. She also reports multiple falls over the last year because of weakness.  Diabetes She presents for her follow-up diabetic visit. She has type 2 diabetes mellitus. Her disease course has been stable. There are no hypoglycemic associated symptoms. Associated symptoms include blurred vision ("at night"). Pertinent negatives for diabetes include no foot paresthesias. Symptoms are stable. Pertinent negatives for diabetic complications include no CVA or peripheral neuropathy. Risk factors for coronary artery disease include dyslipidemia, diabetes mellitus, hypertension, sedentary lifestyle and post-menopausal. She is following a generally unhealthy diet. Her lunch blood glucose range is generally >200 mg/dl. Her overall blood glucose range is 110-130 mg/dl. An ACE inhibitor/angiotensin II receptor blocker is being taken.  Shoulder Pain   Back Pain This is a new problem. The current episode started today. The problem occurs intermittently. The problem has been waxing and waning since onset. The pain is present in the thoracic spine. The pain is at a severity of 10/10. The pain is moderate. The symptoms are aggravated by twisting.      Review of Systems  Eyes: Positive for blurred vision ("at night").  Musculoskeletal: Positive for back pain.  All other systems reviewed and are negative.       Objective:   Physical Exam Vitals reviewed.  Constitutional:      General: She is not in acute distress.    Appearance: She is well-developed and well-nourished.  HENT:     Head: Normocephalic and atraumatic.     Right Ear: External ear normal.     Mouth/Throat:     Mouth: Oropharynx is clear and moist.  Eyes:     Pupils: Pupils are equal, round, and reactive to light.  Neck:     Thyroid: No thyromegaly.  Cardiovascular:     Rate and Rhythm: Normal rate and regular rhythm.     Pulses: Intact distal pulses.     Heart sounds: Normal heart sounds. No murmur heard.   Pulmonary:     Effort: Pulmonary effort is normal. No respiratory distress.     Breath sounds: Normal breath sounds. No wheezing.  Abdominal:     General: Bowel sounds are normal. There is no distension.     Palpations: Abdomen is soft.     Tenderness: There is no abdominal tenderness.  Musculoskeletal:        General: No tenderness or edema. Normal range of motion.       Arms:     Cervical back: Normal range of motion and neck supple.     Comments: Pain in right thoracic area with abduction of right arm. Increased pain with twisting.   Skin:    General: Skin is warm and dry.  Neurological:     Mental Status: She is alert and oriented to person, place, and time.  Cranial Nerves: No cranial nerve deficit.     Deep Tendon Reflexes: Reflexes are normal and symmetric.  Psychiatric:        Mood and Affect: Mood and affect normal.        Behavior: Behavior normal.        Thought Content: Thought content normal.        Judgment: Judgment normal.       BP 139/80   Pulse 80   Temp (!) 97.5 F (36.4 C) (Temporal)   Ht '5\' 4"'  (1.626 m)   Wt 155 lb (70.3 kg)   SpO2 99%   BMI 26.61 kg/m      Assessment & Plan:  JENAYAH ANTU comes in today with chief complaint of Diabetes, Chest Pain, and Shoulder Pain (Under right shoulder/rib pain when takes deep breath just started this morning )   Diagnosis and  orders addressed:  1. Type 2 diabetes mellitus with hyperlipidemia (HCC) Blood sugar stable today.  Print out given on diabetic snacks and nutrition.  She needs to follow up with PCP within next month.  A1C on 09/14/20 was 7.4/  - Glucose Hemocue Waived - For home use only DME 4 wheeled rolling walker with seat ([id number]) - CBC with Differential/Platelet - BMP8+EGFR  2. Elevated glucose - For home use only DME 4 wheeled rolling walker with seat ([id number]) - CBC with Differential/Platelet - BMP8+EGFR  3. Acute right-sided thoracic back pain - I believe this is coming from her cane.  We had a long discussion, that I believe a rolling walker will be a safer options for her. She is still very hesitant. I have sent the prescription.  Fall precautions discussed  - For home use only DME 4 wheeled rolling walker with seat ([id number]) - CBC with Differential/Platelet - BMP8+EGFR  4. Weakness - For home use only DME 4 wheeled rolling walker with seat ([id number]) - CBC with Differential/Platelet - BMP8+EGFR  5. Frequent falls - For home use only DME 4 wheeled rolling walker with seat ([id number]) - CBC with Differential/Platelet - BMP8+EGFR   Labs pending = Diet and exercise encouraged  Follow up plan: 1 month with PCP.   Evelina Dun, FNP

## 2020-10-08 NOTE — Patient Instructions (Signed)
Diabetes Mellitus and Nutrition, Adult When you have diabetes, or diabetes mellitus, it is very important to have healthy eating habits because your blood sugar (glucose) levels are greatly affected by what you eat and drink. Eating healthy foods in the right amounts, at about the same times every day, can help you:  Control your blood glucose.  Lower your risk of heart disease.  Improve your blood pressure.  Reach or maintain a healthy weight. What can affect my meal plan? Every person with diabetes is different, and each person has different needs for a meal plan. Your health care provider may recommend that you work with a dietitian to make a meal plan that is best for you. Your meal plan may vary depending on factors such as:  The calories you need.  The medicines you take.  Your weight.  Your blood glucose, blood pressure, and cholesterol levels.  Your activity level.  Other health conditions you have, such as heart or kidney disease. How do carbohydrates affect me? Carbohydrates, also called carbs, affect your blood glucose level more than any other type of food. Eating carbs naturally raises the amount of glucose in your blood. Carb counting is a method for keeping track of how many carbs you eat. Counting carbs is important to keep your blood glucose at a healthy level, especially if you use insulin or take certain oral diabetes medicines. It is important to know how many carbs you can safely have in each meal. This is different for every person. Your dietitian can help you calculate how many carbs you should have at each meal and for each snack. How does alcohol affect me? Alcohol can cause a sudden decrease in blood glucose (hypoglycemia), especially if you use insulin or take certain oral diabetes medicines. Hypoglycemia can be a life-threatening condition. Symptoms of hypoglycemia, such as sleepiness, dizziness, and confusion, are similar to symptoms of having too much  alcohol.  Do not drink alcohol if: ? Your health care provider tells you not to drink. ? You are pregnant, may be pregnant, or are planning to become pregnant.  If you drink alcohol: ? Do not drink on an empty stomach. ? Limit how much you use to:  0-1 drink a day for women.  0-2 drinks a day for men. ? Be aware of how much alcohol is in your drink. In the [place] one drink equals one 12 oz bottle of beer (355 mL), one 5 oz glass of wine (148 mL), or one 1 oz glass of hard liquor (44 mL). ? Keep yourself hydrated with water, diet soda, or unsweetened iced tea.  Keep in mind that regular soda, juice, and other mixers may contain a lot of sugar and must be counted as carbs. What are tips for following this plan? Reading food labels  Start by checking the serving size on the "Nutrition Facts" label of packaged foods and drinks. The amount of calories, carbs, fats, and other nutrients listed on the label is based on one serving of the item. Many items contain more than one serving per package.  Check the total grams (g) of carbs in one serving. You can calculate the number of servings of carbs in one serving by dividing the total carbs by 15. For example, if a food has 30 g of total carbs per serving, it would be equal to 2 servings of carbs.  Check the number of grams (g) of saturated fats and trans fats in one serving. Choose foods that have   a low amount or none of these fats.  Check the number of milligrams (mg) of salt (sodium) in one serving. Most people should limit total sodium intake to less than 2,300 mg per day.  Always check the nutrition information of foods labeled as "low-fat" or "nonfat." These foods may be higher in added sugar or refined carbs and should be avoided.  Talk to your dietitian to identify your daily goals for nutrients listed on the label. Shopping  Avoid buying canned, pre-made, or processed foods. These foods tend to be high in fat, sodium, and added  sugar.  Shop around the outside edge of the grocery store. This is where you will most often find fresh fruits and vegetables, bulk grains, fresh meats, and fresh dairy. Cooking  Use low-heat cooking methods, such as baking, instead of high-heat cooking methods like deep frying.  Cook using healthy oils, such as olive, canola, or sunflower oil.  Avoid cooking with butter, cream, or high-fat meats. Meal planning  Eat meals and snacks regularly, preferably at the same times every day. Avoid going long periods of time without eating.  Eat foods that are high in fiber, such as fresh fruits, vegetables, beans, and whole grains. Talk with your dietitian about how many servings of carbs you can eat at each meal.  Eat 4-6 oz (112-168 g) of lean protein each day, such as lean meat, chicken, fish, eggs, or tofu. One ounce (oz) of lean protein is equal to: ? 1 oz (28 g) of meat, chicken, or fish. ? 1 egg. ?  cup (62 g) of tofu.  Eat some foods each day that contain healthy fats, such as avocado, nuts, seeds, and fish.   What foods should I eat? Fruits Berries. Apples. Oranges. Peaches. Apricots. Plums. Grapes. Mango. Papaya. Pomegranate. Kiwi. Cherries. Vegetables Lettuce. Spinach. Leafy greens, including kale, chard, collard greens, and mustard greens. Beets. Cauliflower. Cabbage. Broccoli. Carrots. Green beans. Tomatoes. Peppers. Onions. Cucumbers. Brussels sprouts. Grains Whole grains, such as whole-wheat or whole-grain bread, crackers, tortillas, cereal, and pasta. Unsweetened oatmeal. Quinoa. Brown or wild rice. Meats and other proteins Seafood. Poultry without skin. Lean cuts of poultry and beef. Tofu. Nuts. Seeds. Dairy Low-fat or fat-free dairy products such as milk, yogurt, and cheese. The items listed above may not be a complete list of foods and beverages you can eat. Contact a dietitian for more information. What foods should I avoid? Fruits Fruits canned with  syrup. Vegetables Canned vegetables. Frozen vegetables with butter or cream sauce. Grains Refined white flour and flour products such as bread, pasta, snack foods, and cereals. Avoid all processed foods. Meats and other proteins Fatty cuts of meat. Poultry with skin. Breaded or fried meats. Processed meat. Avoid saturated fats. Dairy Full-fat yogurt, cheese, or milk. Beverages Sweetened drinks, such as soda or iced tea. The items listed above may not be a complete list of foods and beverages you should avoid. Contact a dietitian for more information. Questions to ask a health care provider  Do I need to meet with a diabetes educator?  Do I need to meet with a dietitian?  What number can I call if I have questions?  When are the best times to check my blood glucose? Where to find more information:  American Diabetes Association: diabetes.org  Academy of Nutrition and Dietetics: www.eatright.org  National Institute of Diabetes and Digestive and Kidney Diseases: www.niddk.nih.gov  Association of Diabetes Care and Education Specialists: www.diabeteseducator.org Summary  It is important to have healthy eating   habits because your blood sugar (glucose) levels are greatly affected by what you eat and drink.  A healthy meal plan will help you control your blood glucose and maintain a healthy lifestyle.  Your health care provider may recommend that you work with a dietitian to make a meal plan that is best for you.  Keep in mind that carbohydrates (carbs) and alcohol have immediate effects on your blood glucose levels. It is important to count carbs and to use alcohol carefully. This information is not intended to replace advice given to you by your health care provider. Make sure you discuss any questions you have with your health care provider. Document Revised: 07/19/2019 Document Reviewed: 07/19/2019 Elsevier Patient Education  2021 Elsevier Inc.  

## 2020-10-09 LAB — CBC WITH DIFFERENTIAL/PLATELET
Basophils Absolute: 0.1 10*3/uL (ref 0.0–0.2)
Basos: 1 %
EOS (ABSOLUTE): 0.1 10*3/uL (ref 0.0–0.4)
Eos: 1 %
Hematocrit: 36.6 % (ref 34.0–46.6)
Hemoglobin: 11.9 g/dL (ref 11.1–15.9)
Immature Grans (Abs): 0 10*3/uL (ref 0.0–0.1)
Immature Granulocytes: 0 %
Lymphocytes Absolute: 3.1 10*3/uL (ref 0.7–3.1)
Lymphs: 32 %
MCH: 28.5 pg (ref 26.6–33.0)
MCHC: 32.5 g/dL (ref 31.5–35.7)
MCV: 88 fL (ref 79–97)
Monocytes Absolute: 0.7 10*3/uL (ref 0.1–0.9)
Monocytes: 7 %
Neutrophils Absolute: 5.6 10*3/uL (ref 1.4–7.0)
Neutrophils: 59 %
Platelets: 347 10*3/uL (ref 150–450)
RBC: 4.17 x10E6/uL (ref 3.77–5.28)
RDW: 13.1 % (ref 11.7–15.4)
WBC: 9.5 10*3/uL (ref 3.4–10.8)

## 2020-10-09 LAB — BMP8+EGFR
BUN/Creatinine Ratio: 16 (ref 12–28)
BUN: 13 mg/dL (ref 8–27)
CO2: 21 mmol/L (ref 20–29)
Calcium: 9.6 mg/dL (ref 8.7–10.3)
Chloride: 99 mmol/L (ref 96–106)
Creatinine, Ser: 0.82 mg/dL (ref 0.57–1.00)
GFR calc Af Amer: 73 mL/min/{1.73_m2} (ref >59)
GFR calc non Af Amer: 64 mL/min/{1.73_m2} (ref >59)
Glucose: 130 mg/dL — ABNORMAL HIGH (ref 65–99)
Potassium: 3.9 mmol/L (ref 3.5–5.2)
Sodium: 138 mmol/L (ref 134–144)

## 2020-10-11 DIAGNOSIS — H35363 Drusen (degenerative) of macula, bilateral: Secondary | ICD-10-CM | POA: Diagnosis not present

## 2020-10-11 DIAGNOSIS — H35373 Puckering of macula, bilateral: Secondary | ICD-10-CM | POA: Diagnosis not present

## 2020-10-11 DIAGNOSIS — H43821 Vitreomacular adhesion, right eye: Secondary | ICD-10-CM | POA: Diagnosis not present

## 2020-10-11 DIAGNOSIS — H353133 Nonexudative age-related macular degeneration, bilateral, advanced atrophic without subfoveal involvement: Secondary | ICD-10-CM | POA: Diagnosis not present

## 2020-10-16 ENCOUNTER — Ambulatory Visit: Payer: Medicare Other | Admitting: Family

## 2020-10-16 ENCOUNTER — Encounter: Payer: Self-pay | Admitting: Nurse Practitioner

## 2020-10-16 ENCOUNTER — Ambulatory Visit (INDEPENDENT_AMBULATORY_CARE_PROVIDER_SITE_OTHER): Payer: Medicare Other | Admitting: Nurse Practitioner

## 2020-10-16 DIAGNOSIS — R0981 Nasal congestion: Secondary | ICD-10-CM | POA: Insufficient documentation

## 2020-10-16 MED ORDER — SALINE SPRAY 0.65 % NA SOLN
1.0000 | NASAL | 0 refills | Status: AC | PRN
Start: 2020-10-16 — End: ?

## 2020-10-16 MED ORDER — AMOXICILLIN-POT CLAVULANATE 875-125 MG PO TABS: 1.0000 | ORAL_TABLET | Freq: Two times a day (BID) | ORAL | 0 refills | Status: DC

## 2020-10-16 NOTE — Progress Notes (Signed)
   Virtual Visit via telephone Note Due to COVID-19 pandemic this visit was conducted virtually. This visit type was conducted due to national recommendations for restrictions regarding the COVID-19 Pandemic (e.g. social distancing, sheltering in place) in an effort to limit this patient's exposure and mitigate transmission in our community. All issues noted in this document were discussed and addressed.  A physical exam was not performed with this format.  I connected with Debra Phillips on 10/16/20 at  15 p.m. by telephone and verified that I am speaking with the correct person using two identifiers. Debra Phillips is currently located at at home alone during visit. The provider, Ivy Lynn, NP is located in their office at time of visit.  I discussed the limitations, risks, security and privacy concerns of performing an evaluation and management service by telephone and the availability of in person appointments. I also discussed with the patient that there may be a patient responsible charge related to this service. The patient expressed understanding and agreed to proceed.   History and Present Illness:  Sinusitis This is a new problem. The problem is unchanged. There has been no fever. Associated symptoms include chills, congestion, coughing, headaches and sinus pressure. Past treatments include nothing.      Review of Systems  Constitutional: Positive for chills.  HENT: Positive for congestion and sinus pressure.   Eyes: Negative.   Respiratory: Positive for cough.   Cardiovascular: Negative.   Genitourinary: Negative.   Musculoskeletal: Negative.   Neurological: Positive for headaches.     Observations/Objective:  Tele- visit-patient did not sound to be in distress  Assessment and Plan:  Sinus congestion Symptoms not well controlled.  Patient is reporting in the last 24 hours increased congestion, sinus pressure, no cough, fever, chills or headache.  Augmentin  875-125 mg 2 times daily for 7 days.  Ocean nasal spray to reduce congestion.  Follow-up with unresolved symptoms.  Rx sent to pharmacy.    Follow Up Instructions: Follow-up with worsening unresolved symptoms    I discussed the assessment and treatment plan with the patient. The patient was provided an opportunity to ask questions and all were answered. The patient agreed with the plan and demonstrated an understanding of the instructions.   The patient was advised to call back or seek an in-person evaluation if the symptoms worsen or if the condition fails to improve as anticipated.  The above assessment and management plan was discussed with the patient. The patient verbalized understanding of and has agreed to the management plan. Patient is aware to call the clinic if symptoms persist or worsen. Patient is aware when to return to the clinic for a follow-up visit. Patient educated on when it is appropriate to go to the emergency department.   Time call ended: 5:26 PM  I provided 11 minutes of non-face-to-face time during this encounter.    Ivy Lynn, NP

## 2020-10-16 NOTE — Assessment & Plan Note (Signed)
Symptoms not well controlled.  Patient is reporting in the last 24 hours increased congestion, sinus pressure, no cough, fever, chills or headache.  Augmentin 875-125 mg 2 times daily for 7 days.  Ocean nasal spray to reduce congestion.  Follow-up with unresolved symptoms.  Rx sent to pharmacy.

## 2020-10-16 NOTE — Progress Notes (Deleted)
Acute Office Visit  Subjective:    Patient ID: Debra Phillips, female    DOB: [birth year], 87 y.o.   MRN: [id number]  No chief complaint on file.   HPI Patient is in today for ***  Past Medical History:  Diagnosis Date  . Abnormality of gait 01/29/2016  . Aortic root dilatation (HCC)    Mild  . Cataract   . Diabetes mellitus without complication ([place])    type 2  . Dyslipidemia   . Dyspnea    with exertion  . Dysrhythmia    hx of occ palpitations  . Esophageal stricture    s/p dilation  . GERD (gastroesophageal reflux disease)   . Headache   . History of hiatal hernia   . Hyperlipidemia   . Hypertension   . Macular pucker, left eye   . Osteoarthritis   . Osteoporosis   . Tinnitus    left ear last week or so    Past Surgical History:  Procedure Laterality Date  . ABDOMINAL HYSTERECTOMY     partial  . APPENDECTOMY    . BACK SURGERY     lower x 1  . CARDIAC CATHETERIZATION  2010   both cataracts  . CHOLECYSTECTOMY  1993  . CYSTOSCOPY N/A 08/28/2016   Procedure: CYSTOSCOPY;  Surgeon: Carolan Clines, MD;  Location: [facility];  Service: Urology;  Laterality: N/A;  . EYE SURGERY     cataracts  . EYE SURGERY     for macular pucker  . FRACTURE SURGERY     rt wrist2010  . HARDWARE REMOVAL  12/10/2011   Procedure: HARDWARE REMOVAL;  Surgeon: Schuyler Amor, MD;  Location: [facility];  Service: Orthopedics;  Laterality: Right;  radial head  . KNEE ARTHROSCOPY  2004   Left and right  . KYPHOSIS SURGERY    . LUMBAR FUSION  [id number]  . RADIAL HEAD ARTHROPLASTY  5/12   orif rt radial head  . SHOULDER SURGERY     Right rotator cuff epair  . TOTAL KNEE ARTHROPLASTY     Left  . VAGINAL PROLAPSE REPAIR N/A 08/28/2016   Procedure: ANTERIOR VAGINAL VAULT SUSPENSION WITH SACROSPINOUS FIXATION;  Surgeon: Carolan Clines, MD;  Location: [facility];  Service: Urology;  Laterality: N/A;    Family History  Problem Relation Age of Onset  . Pneumonia  Mother   . Brain cancer Father   . Tremor Father   . Arthritis Sister   . COPD Sister   . Emphysema Brother   . COPD Brother   . Tremor Brother   . Lung cancer Brother   . Heart disease Brother   . Liver cancer Brother   . Diabetes Other         1 child has diabetes at age 70 and the other 59 are healthy    Social History   Socioeconomic History  . Marital status: Widowed    Spouse name: Not on file  . Number of children: 5  . Years of education: 5  . Highest education level: Not on file  Occupational History  . 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 Smoking status: Never Smoker  . Smokeless tobacco: Never Used  Vaping Use  . Vaping Use: Never used  Substance and Sexual Activity  . Alcohol use: No  . Drug use: No  . Sexual activity: Not Currently  Other Topics Concern  . Not on  file  Social History Narrative   Pt lives at home alone   Husband w/ Alzheimer's passed away   Right-handed   Drinks 1-2 cups of coffee daily   Social Determinants of Health   Financial Resource Strain: Not on file  Food Insecurity: Not on file  Transportation Needs: Not on file  Physical Activity: Not on file  Stress: Not on file  Social Connections: Not on file  Intimate Partner Violence: Not on file    Outpatient Medications Prior to Visit  Medication Sig Dispense Refill  . aspirin EC 81 MG tablet Take 81 mg by mouth daily.    . Calcium Carb-Cholecalciferol 600-500 MG-UNIT CAPS Take 1 capsule by mouth every morning.    . Cholecalciferol 1000 UNITS tablet Take 1,000 Units by mouth daily.    Marland Kitchen diltiazem (CARDIZEM CD) 180 MG 24 hr capsule Take 1 capsule (180 mg total) by mouth daily. 90 capsule 3  . hydroxypropyl methylcellulose / hypromellose (ISOPTO TEARS / GONIOVISC) 2.5 % ophthalmic solution Place 1 drop into both eyes 3 (three) times daily as needed for dry eyes.    . Lancets (ONETOUCH ULTRASOFT) lancets CHECK BLOOD SUGAR ONCE A  DAY 50 each 9  . lisinopril (ZESTRIL) 40 MG tablet TAKE 1 TABLET DAILY 90 tablet 1  . metFORMIN (GLUCOPHAGE) 1000 MG tablet TAKE  (1)  TABLET TWICE A DAY WITH MEALS (BREAKFAST AND SUPPER) 180 tablet 3  . multivitamin-lutein (OCUVITE-LUTEIN) CAPS capsule Take 1 capsule by mouth daily.    Marland Kitchen MYRBETRIQ 25 MG TB24 tablet Take 25 mg by mouth daily.    . nitroGLYCERIN (NITROSTAT) 0.4 MG SL tablet Place 1 tablet (0.4 mg total) under the tongue every 5 (five) minutes as needed for chest pain. 25 tablet 3  . ONETOUCH ULTRA test strip Check BS daily Dx E11.69 100 strip 3  . pantoprazole (PROTONIX) 40 MG tablet Take 40 mg by mouth daily.    . pravastatin (PRAVACHOL) 40 MG tablet Take 1 tablet (40 mg total) by mouth daily. 90 tablet 3   No facility-administered medications prior to visit.    Allergies  Allergen Reactions  . Codeine Nausea And Vomiting  . Doxycycline Hives  . Other Nausea Only    Almost all antibiotics  . Sulfonamide Derivatives Hives  . Vancomycin Itching    Review of Systems     Objective:    Physical Exam  There were no vitals taken for this visit. Wt Readings from Last 3 Encounters:  10/08/20 155 lb (70.3 kg)  08/14/20 154 lb 3.2 oz (69.9 kg)  05/25/20 149 lb 4 oz (67.7 kg)    Health Maintenance Due  Topic Date Due  . DEXA SCAN  06/13/2018  . OPHTHALMOLOGY EXAM  09/19/2019  . MAMMOGRAM  02/16/2020  . COVID-19 Vaccine (3 - Booster for Moderna series) 05/23/2020  . FOOT EXAM  10/06/2020    There are no preventive care reminders to display for this patient.   Lab Results  Component Value Date   TSH 1.680 09/03/2018   Lab Results  Component Value Date   WBC 9.5 10/08/2020   HGB 11.9 10/08/2020   HCT 36.6 10/08/2020   MCV 88 10/08/2020   PLT 347 10/08/2020   Lab Results  Component Value Date   NA 138 10/08/2020   K 3.9 10/08/2020   CO2 21 10/08/2020   GLUCOSE 130 (H) 10/08/2020   BUN 13 10/08/2020   CREATININE 0.82 10/08/2020   BILITOT 0.2  09/14/2020   ALKPHOS  69 09/14/2020   AST 14 09/14/2020   ALT 10 09/14/2020   PROT 6.1 09/14/2020   ALBUMIN 4.3 09/14/2020   CALCIUM 9.6 10/08/2020   ANIONGAP 11 04/11/2019   Lab Results  Component Value Date   CHOL 167 09/03/2018   Lab Results  Component Value Date   HDL 73 09/03/2018   Lab Results  Component Value Date   LDLCALC 66 09/03/2018   Lab Results  Component Value Date   TRIG 141 09/03/2018   Lab Results  Component Value Date   CHOLHDL 2.3 09/03/2018   Lab Results  Component Value Date   HGBA1C 7.4 (H) 09/14/2020       Assessment & Plan:   Problem List Items Addressed This Visit      Respiratory   Sinus congestion - Primary   Relevant Medications   amoxicillin-clavulanate (AUGMENTIN) 875-125 MG tablet       Meds ordered this encounter  Medications  . amoxicillin-clavulanate (AUGMENTIN) 875-125 MG tablet    Sig: Take 1 tablet by mouth 2 (two) times daily.    Dispense:  14 tablet    Refill:  0    Order Specific Question:   Supervising Provider    Answer:   [name] [[id number]]     Ivy Lynn, NP

## 2020-11-08 ENCOUNTER — Ambulatory Visit (INDEPENDENT_AMBULATORY_CARE_PROVIDER_SITE_OTHER): Payer: Medicare Other | Admitting: *Deleted

## 2020-11-08 ENCOUNTER — Encounter: Payer: Self-pay | Admitting: *Deleted

## 2020-11-08 DIAGNOSIS — Z0000 Encounter for general adult medical examination without abnormal findings: Secondary | ICD-10-CM | POA: Diagnosis not present

## 2020-11-08 NOTE — Progress Notes (Addendum)
MEDICARE ANNUAL WELLNESS VISIT  11/08/2020  Telephone Visit Disclaimer This Medicare AWV was conducted by telephone due to national recommendations for restrictions regarding the COVID-19 Pandemic (e.g. social distancing).  I verified, using two identifiers, that I am speaking with Debra Phillips or their authorized healthcare agent. I discussed the limitations, risks, security, and privacy concerns of performing an evaluation and management service by telephone and the potential availability of an in-person appointment in the future. The patient expressed understanding and agreed to proceed.  Location of Patient: Home Location of Provider (nurse):  [facility]  Subjective:    Debra Phillips is a 87 y.o. female patient of [name], DO who had a Medicare Annual Wellness Visit today via telephone. Debra Phillips is Retired and lives alone. she has 5 children. she reports that she is socially active and does interact with friends/family regularly. she is not physically active and enjoys gardening.  Patient Care Team: [name], DO as PCP - General (Family Medicine) Minus Breeding, MD as PCP - Cardiology (Cardiology) Irine Seal, MD as Consulting Physician (Urology) Minus Breeding, MD as Consulting Physician (Cardiology) Glenna Fellows, MD as Attending Physician (Neurosurgery) Dorene Ar, MD as Consulting Physician (Pain Medicine) Bunnie Pion, MD as Consulting Physician (Ophthalmology) Gala Romney, Cristopher Estimable, MD as Consulting Physician (Gastroenterology) Ilean China, RN as Case Manager  Advanced Directives 11/08/2020 10/27/2019 04/11/2019 12/22/2017 08/29/2016 08/21/2016 06/15/2015  Does Patient Have a Medical Advance Directive? No No No No No No No  Would patient like information on creating a medical advance directive? No - Patient declined No - Patient declined No - Patient declined No - Patient declined No - Patient declined;Yes (ED -  Information included in AVS) No - Patient declined;Yes (ED - Information included in AVS) Yes - Educational materials given  Pre-existing out of facility DNR order (yellow form or pink MOST form) - - - - - - -    Hospital Utilization Over the Past 12 Months: # of hospitalizations or ER visits: 1 # of surgeries: 0  Review of Systems    Patient reports that her overall health is worse compared to last year.   History obtained from chart review and the patient  Patient Reported Readings (BP, Pulse, CBG, Weight, etc) none  Pain Assessment       Current Medications & Allergies (verified) Allergies as of 11/08/2020      Reactions   Codeine Nausea And Vomiting   Doxycycline Hives   Other Nausea Only   Almost all antibiotics   Sulfonamide Derivatives Hives   Vancomycin Itching      Medication List       Accurate as of November 08, 2020 11:09 AM. If you have any questions, ask your nurse or doctor.        STOP taking these medications   amoxicillin-clavulanate 875-125 MG tablet Commonly known as: Augmentin     TAKE these medications   aspirin EC 81 MG tablet Take 81 mg by mouth daily.   Calcium Carb-Cholecalciferol 600-500 MG-UNIT Caps Take 1 capsule by mouth every morning.   Cholecalciferol 25 MCG (1000 UT) tablet Take 1,000 Units by mouth daily.   diltiazem 180 MG 24 hr capsule Commonly known as: CARDIZEM CD Take 1 capsule (180 mg total) by mouth daily.   estradiol 0.1 MG/GM vaginal cream Commonly known as: ESTRACE SMARTSIG:Sparingly Vaginal Twice a Week   hydroxypropyl methylcellulose / hypromellose 2.5 % ophthalmic solution Commonly known as: ISOPTO  TEARS / GONIOVISC Place 1 drop into both eyes 3 (three) times daily as needed for dry eyes.   lisinopril 40 MG tablet Commonly known as: ZESTRIL TAKE 1 TABLET DAILY   metFORMIN 1000 MG tablet Commonly known as: GLUCOPHAGE TAKE  (1)  TABLET TWICE A DAY WITH MEALS (BREAKFAST AND SUPPER)   multivitamin-lutein  Caps capsule Take 1 capsule by mouth daily.   Myrbetriq 25 MG Tb24 tablet Generic drug: mirabegron ER Take 25 mg by mouth daily.   nitroGLYCERIN 0.4 MG SL tablet Commonly known as: NITROSTAT Place 1 tablet (0.4 mg total) under the tongue every 5 (five) minutes as needed for chest pain.   OneTouch Ultra test strip Generic drug: glucose blood Check BS daily Dx E11.69   onetouch ultrasoft lancets CHECK BLOOD SUGAR ONCE A DAY   pantoprazole 40 MG tablet Commonly known as: PROTONIX Take 40 mg by mouth daily.   pravastatin 40 MG tablet Commonly known as: PRAVACHOL Take 1 tablet (40 mg total) by mouth daily.   sodium chloride 0.65 % Soln nasal spray Commonly known as: OCEAN Place 1 spray into both nostrils as needed for congestion.       History (reviewed): Past Medical History:  Diagnosis Date  . Abnormality of gait 01/29/2016  . Aortic root dilatation (HCC)    Mild  . Cataract   . Diabetes mellitus without complication ([place])    type 2  . Dyslipidemia   . Dyspnea    with exertion  . Dysrhythmia    hx of occ palpitations  . Esophageal stricture    s/p dilation  . GERD (gastroesophageal reflux disease)   . Headache   . History of hiatal hernia   . Hyperlipidemia   . Hypertension   . Macular pucker, left eye   . Osteoarthritis   . Osteoporosis   . Tinnitus    left ear last week or so   Past Surgical History:  Procedure Laterality Date  . ABDOMINAL HYSTERECTOMY     partial  . APPENDECTOMY    . BACK SURGERY     lower x 1  . CARDIAC CATHETERIZATION  2010   both cataracts  . CHOLECYSTECTOMY  1993  . CYSTOSCOPY N/A 08/28/2016   Procedure: CYSTOSCOPY;  Surgeon: Carolan Clines, MD;  Location: [facility];  Service: Urology;  Laterality: N/A;  . EYE SURGERY     cataracts  . EYE SURGERY     for macular pucker  . FRACTURE SURGERY     rt wrist2010  . HARDWARE REMOVAL  12/10/2011   Procedure: HARDWARE REMOVAL;  Surgeon: Schuyler Amor, MD;  Location: [facility];  Service: Orthopedics;  Laterality: Right;  radial head  . KNEE ARTHROSCOPY  2004   Left and right  . KYPHOSIS SURGERY    . LUMBAR FUSION  [id number]  . RADIAL HEAD ARTHROPLASTY  5/12   orif rt radial head  . SHOULDER SURGERY     Right rotator cuff epair  . TOTAL KNEE ARTHROPLASTY     Left  . VAGINAL PROLAPSE REPAIR N/A 08/28/2016   Procedure: ANTERIOR VAGINAL VAULT SUSPENSION WITH SACROSPINOUS FIXATION;  Surgeon: Carolan Clines, MD;  Location: [facility];  Service: Urology;  Laterality: N/A;   Family History  Problem Relation Age of Onset  . Pneumonia Mother   . Brain cancer Father   . Tremor Father   . Arthritis Sister   . COPD Sister   . Emphysema Brother   . COPD Brother   .  Tremor Brother   . Lung cancer Brother   . Heart disease Brother   . Liver cancer Brother   . Diabetes Other         1 child has diabetes at age 62 and the other 58 are healthy   Social History   Socioeconomic History  . Marital status: Widowed    Spouse name: Not on file  . Number of children: 5  . Years of education: 5  . Highest education level: Not on file  Occupational History  . 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 Smoking status: Never Smoker  . Smokeless tobacco: Never Used  Vaping Use  . Vaping Use: Never used  Substance and Sexual Activity  . Alcohol use: No  . Drug use: No  . Sexual activity: Not Currently  Other Topics Concern  . Not on file  Social History Narrative   Pt lives at home alone   Husband w/ Alzheimer's passed away   Right-handed   Drinks 1-2 cups of coffee daily   Social Determinants of Health   Financial Resource Strain: Not on file  Food Insecurity: Not on file  Transportation Needs: Not on file  Physical Activity: Not on file  Stress: Not on file  Social Connections: Not on file    Activities of Daily Living In your present state of health, do you have any difficulty performing the  following activities: 11/08/2020 06/04/2020  Hearing? N Y  Vision? N N  Comment Wears glasses -  Difficulty concentrating or making decisions? N N  Walking or climbing stairs? Y Y  Comment At times hx of falls. uses a cane for balance  Dressing or bathing? N N  Doing errands, shopping? N Y  Comment - drives locally but daughters drive to appts outisde of the [employer] and eating ? N N  Using the Toilet? N N  In the past six months, have you accidently leaked urine? Y Y  Comment Will have urine leakage if she does not go to bathroom as soon as urge hits. -  Do you have problems with loss of bowel control? N N  Managing your Medications? N N  Managing your Finances? N N  Housekeeping or managing your Housekeeping? N N  Some recent data might be hidden    Patient Education/ Literacy How often do you need to have someone help you when you read instructions, pamphlets, or other written materials from your doctor or pharmacy?: 1 - Never  Exercise Current Exercise Habits: The patient does not participate in regular exercise at present  Diet Patient reports consuming 2 meals a day and 1 snack(s) a day Patient reports that her primary diet is: Regular Patient reports that she does have regular access to food.   Depression Screen PHQ 2/9 Scores 11/08/2020 05/25/2020 05/14/2020 01/09/2020 11/22/2019 10/27/2019 10/07/2019  PHQ - 2 Score 0 0 1 2 3  0 2  PHQ- 9 Score - - 4 7 6  - 9     Fall Risk Fall Risk  11/08/2020 06/04/2020 05/25/2020 05/14/2020 01/09/2020  Falls in the past year? 1 1 1 1 1   Number falls in past yr: 0 1 1 1 1   Injury with Fall? 1 1 1 1 1   Comment - - - - -  Risk Factor Category  - - - - -  Risk for fall due to : History of fall(s) History of fall(s);Impaired  balance/gait;Impaired mobility;Impaired vision History of fall(s) History of fall(s);Impaired balance/gait History of fall(s);Impaired mobility  Follow up Falls prevention discussed Falls evaluation  completed;Falls prevention discussed Falls evaluation completed Falls prevention discussed Falls prevention discussed  Comment - - - - -     Objective:  Debra Phillips seemed alert and oriented and she participated appropriately during our telephone visit.  Blood Pressure Weight BMI  BP Readings from Last 3 Encounters:  10/08/20 139/80  08/14/20 (!) 158/85  05/25/20 125/83   Wt Readings from Last 3 Encounters:  10/08/20 155 lb (70.3 kg)  08/14/20 154 lb 3.2 oz (69.9 kg)  05/25/20 149 lb 4 oz (67.7 kg)   BMI Readings from Last 1 Encounters:  10/08/20 26.61 kg/m    *Unable to obtain current vital signs, weight, and BMI due to telephone visit type  Hearing/Vision  . Debra Phillips did not seem to have difficulty with hearing/understanding during the telephone conversation . Reports that she has had a formal eye exam by an eye care professional within the past year . Reports that she has not had a formal hearing evaluation within the past year *Unable to fully assess hearing and vision during telephone visit type  Cognitive Function: 6CIT Screen 11/08/2020 10/27/2019  What Year? 0 points 0 points  What month? 0 points 0 points  What time? 0 points 0 points  Count back from 20 0 points 0 points  Months in reverse 0 points 0 points  Repeat phrase 6 points 2 points  Total Score 6 2   (Normal:0-7, Significant for Dysfunction: >8)  Normal Cognitive Function Screening: Yes   Immunization & Health Maintenance Record Immunization History  Administered Date(s) Administered  . Fluad Quad(high Dose 65+) 07/20/2019  . Influenza, High Dose Seasonal PF 06/13/2016, 06/18/2017, 06/29/2018  . Influenza,inj,Quad PF,6+ Mos 06/15/2015  . Influenza-Unspecified 04/25/2013, 05/10/2014, 06/13/2016, 06/18/2017, 06/29/2018, 07/13/2020  . Moderna Sars-Covid-2 Vaccination 10/10/2019, 11/21/2019  . Pneumococcal Conjugate-13 08/24/2013  . Pneumococcal Polysaccharide-23 06/17/1996  . Td 02/17/2017  . Tdap  02/17/2017  . Zoster 03/17/2014    Health Maintenance  Topic Date Due  . DEXA SCAN  06/13/2018  . OPHTHALMOLOGY EXAM  09/19/2019  . MAMMOGRAM  02/16/2020  . COVID-19 Vaccine (3 - Booster for Moderna series) 05/23/2020  . FOOT EXAM  10/06/2020  . HEMOGLOBIN A1C  03/14/2021  . TETANUS/TDAP  02/18/2027  . INFLUENZA VACCINE  Completed  . PNA vac Low Risk Adult  Completed  . HPV VACCINES  Aged Out       Assessment  This is a routine wellness examination for Debra Phillips.  Health Maintenance: Due or Overdue Health Maintenance Due  Topic Date Due  . DEXA SCAN  06/13/2018  . OPHTHALMOLOGY EXAM  09/19/2019  . MAMMOGRAM  02/16/2020  . COVID-19 Vaccine (3 - Booster for Moderna series) 05/23/2020  . FOOT EXAM  10/06/2020    Debra Phillips does not need a referral for Community Assistance: Care Management:   no Social Work:    no Prescription Assistance:  no Nutrition/Diabetes Education:  no   Plan:  Personalized Goals Goals Addressed            This Visit's Progress   . AWV       11/08/2020 AWV Goal: Fall Prevention  . Over the next year, patient will decrease their risk for falls by: o Using assistive devices, such as a cane or walker, as needed o Identifying fall risks within their home and correcting them by: -  Removing throw rugs - Adding handrails to stairs or ramps - Removing clutter and keeping a clear pathway throughout the home - Increasing light, especially at night - Adding shower handles/bars - Raising toilet seat o Identifying potential personal risk factors for falls: - Medication side effects - Incontinence/urgency - Vestibular dysfunction - Hearing loss - Musculoskeletal disorders - Neurological disorders - Orthostatic hypotension  11/08/2020 AWV Goal: Diabetes Management  . Patient will maintain an A1C level below 8.0 . Patient will not develop any diabetic foot complications . Patient will not experience any hypoglycemic episodes  over the next 3 months . Patient will notify our office of any CBG readings outside of the provider recommended range by calling [phone] . Patient will adhere to provider recommendations for diabetes management  Patient Self Management Activities . take all medications as prescribed and report any negative side effects . monitor and record blood sugar readings as directed . adhere to a low carbohydrate diet that incorporates lean proteins, vegetables, whole grains, low glycemic fruits . check feet daily noting any sores, cracks, injuries, or callous formations . see PCP or podiatrist if she notices any changes in her legs, feet, or toenails . Patient will visit PCP and have an A1C level checked every 3 to 6 months as directed  . have a yearly eye exam to monitor for vascular changes associated with diabetes and will request that the report be sent to her pcp.  . consult with her PCP regarding any changes in her health or new or worsening symptoms       Personalized Health Maintenance & Screening Recommendations  Screening mammography Bone densitometry screening Diabetic Foot Exam  Lung Cancer Screening Recommended: no (Low Dose CT Chest recommended if Age 34-80 years, 30 pack-year currently smoking OR have quit w/in past 15 years) Hepatitis C Screening recommended: no HIV Screening recommended: no  Advanced Directives: Written information was not prepared per patient's request.  Referrals & Orders No orders of the defined types were placed in this encounter.   Follow-up Plan . Follow-up with [name], DO as planned . Schedule yearly mammogram . AVS printed and mailed to patient   I have personally reviewed and noted the following in the patient's chart:   . Medical and social history . Use of alcohol, tobacco or illicit drugs  . Current medications and supplements . Functional ability and status . Nutritional status . Physical activity . Advanced  directives . List of other physicians . Hospitalizations, surgeries, and ER visits in previous 12 months . Vitals . Screenings to include cognitive, depression, and falls . Referrals and appointments  In addition, I have reviewed and discussed with Debra Phillips certain preventive protocols, quality metrics, and best practice recommendations. A written personalized care plan for preventive services as well as general preventive health recommendations is available and can be mailed to the patient at her request.      Debra Ferrier, LPN  [id number]    I have reviewed and agree with the above AWV documentation.   Debra Dun, FNP

## 2020-11-08 NOTE — Telephone Encounter (Signed)
Erroneous

## 2020-11-19 ENCOUNTER — Ambulatory Visit (INDEPENDENT_AMBULATORY_CARE_PROVIDER_SITE_OTHER): Payer: Medicare Other | Admitting: Family Medicine

## 2020-11-19 ENCOUNTER — Encounter: Payer: Self-pay | Admitting: Family Medicine

## 2020-11-19 ENCOUNTER — Other Ambulatory Visit: Payer: Self-pay

## 2020-11-19 VITALS — BP 130/75 | HR 73 | Temp 98.2°F | Ht 64.0 in | Wt 157.8 lb

## 2020-11-19 DIAGNOSIS — E785 Hyperlipidemia, unspecified: Secondary | ICD-10-CM | POA: Diagnosis not present

## 2020-11-19 DIAGNOSIS — I152 Hypertension secondary to endocrine disorders: Secondary | ICD-10-CM

## 2020-11-19 DIAGNOSIS — E559 Vitamin D deficiency, unspecified: Secondary | ICD-10-CM | POA: Diagnosis not present

## 2020-11-19 DIAGNOSIS — E1159 Type 2 diabetes mellitus with other circulatory complications: Secondary | ICD-10-CM | POA: Diagnosis not present

## 2020-11-19 DIAGNOSIS — E1169 Type 2 diabetes mellitus with other specified complication: Secondary | ICD-10-CM

## 2020-11-19 LAB — BAYER DCA HB A1C WAIVED: HB A1C (BAYER DCA - WAIVED): 7 % — ABNORMAL HIGH (ref <7.0)

## 2020-11-19 MED ORDER — METFORMIN HCL 1000 MG PO TABS: 1000.0000 mg | ORAL_TABLET | Freq: Every day | ORAL | 3 refills | Status: DC

## 2020-11-19 NOTE — Progress Notes (Unsigned)
Subjective: CC: DM PCP: [name], DO LSL:HTDSKA Debra Phillips is a 87 y.o. female presenting to clinic today for:  1. Type 2 Diabetes with hypertension, hyperlipidemia:  She usually stays away from sweets but celebrated her 89th birthday recently so did indulge.  She has poor vision at baseline but no abrupt changes.  She has been having some fatigue and "weakness". Not sure if it's her BP or BGs.  Last eye exam: needs Last foot exam: needs Last A1c:  Lab Results  Component Value Date   HGBA1C 7.4 (H) 09/14/2020   Nephropathy screen indicated?: UTD Last flu, zoster and/or pneumovax:  Immunization History  Administered Date(s) Administered  . Fluad Quad(high Dose 65+) 07/20/2019  . Influenza, High Dose Seasonal PF 06/13/2016, 06/18/2017, 06/29/2018  . Influenza,inj,Quad PF,6+ Mos 06/15/2015  . Influenza-Unspecified 04/25/2013, 05/10/2014, 06/13/2016, 06/18/2017, 06/29/2018, 07/13/2020  . Moderna Sars-Covid-2 Vaccination 10/10/2019, 11/21/2019  . Pneumococcal Conjugate-13 08/24/2013  . Pneumococcal Polysaccharide-23 06/17/1996  . Td 02/17/2017  . Tdap 02/17/2017  . Zoster 03/17/2014   ROS: Per HPI  Allergies  Allergen Reactions  . Codeine Nausea And Vomiting  . Doxycycline Hives  . Other Nausea Only    Almost all antibiotics  . Sulfonamide Derivatives Hives  . Vancomycin Itching   Past Medical History:  Diagnosis Date  . Abnormality of gait 01/29/2016  . Aortic root dilatation (HCC)    Mild  . Cataract   . Diabetes mellitus without complication ([place])    type 2  . Dyslipidemia   . Dyspnea    with exertion  . Dysrhythmia    hx of occ palpitations  . Esophageal stricture    s/p dilation  . GERD (gastroesophageal reflux disease)   . Headache   . History of hiatal hernia   . Hyperlipidemia   . Hypertension   . Macular pucker, left eye   . Osteoarthritis   . Osteoporosis   . Tinnitus    left ear last week or so    Current Outpatient Medications:   .  aspirin EC 81 MG tablet, Take 81 mg by mouth daily., Disp: , Rfl:  .  Calcium Carb-Cholecalciferol 600-500 MG-UNIT CAPS, Take 1 capsule by mouth every morning., Disp: , Rfl:  .  Cholecalciferol 1000 UNITS tablet, Take 1,000 Units by mouth daily., Disp: , Rfl:  .  diltiazem (CARDIZEM CD) 180 MG 24 hr capsule, Take 1 capsule (180 mg total) by mouth daily., Disp: 90 capsule, Rfl: 3 .  estradiol (ESTRACE) 0.1 MG/GM vaginal cream, SMARTSIG:Sparingly Vaginal Twice a Week, Disp: , Rfl:  .  hydroxypropyl methylcellulose / hypromellose (ISOPTO TEARS / GONIOVISC) 2.5 % ophthalmic solution, Place 1 drop into both eyes 3 (three) times daily as needed for dry eyes., Disp: , Rfl:  .  Lancets (ONETOUCH ULTRASOFT) lancets, CHECK BLOOD SUGAR ONCE A DAY, Disp: 50 each, Rfl: 9 .  lisinopril (ZESTRIL) 40 MG tablet, TAKE 1 TABLET DAILY, Disp: 90 tablet, Rfl: 1 .  metFORMIN (GLUCOPHAGE) 1000 MG tablet, TAKE  (1)  TABLET TWICE A DAY WITH MEALS (BREAKFAST AND SUPPER), Disp: 180 tablet, Rfl: 3 .  multivitamin-lutein (OCUVITE-LUTEIN) CAPS capsule, Take 1 capsule by mouth daily., Disp: , Rfl:  .  MYRBETRIQ 25 MG TB24 tablet, Take 25 mg by mouth daily., Disp: , Rfl:  .  nitroGLYCERIN (NITROSTAT) 0.4 MG SL tablet, Place 1 tablet (0.4 mg total) under the tongue every 5 (five) minutes as needed for chest pain., Disp: 25 tablet, Rfl: 3 .  ONETOUCH ULTRA  test strip, Check BS daily Dx E11.69, Disp: 100 strip, Rfl: 3 .  pantoprazole (PROTONIX) 40 MG tablet, Take 40 mg by mouth daily., Disp: , Rfl:  .  pravastatin (PRAVACHOL) 40 MG tablet, Take 1 tablet (40 mg total) by mouth daily., Disp: 90 tablet, Rfl: 3 .  sodium chloride (OCEAN) 0.65 % SOLN nasal spray, Place 1 spray into both nostrils as needed for congestion., Disp: 30 mL, Rfl: 0 Social History   Socioeconomic History  . Marital status: Widowed    Spouse name: Not on file  . Number of children: 5  . Years of education: 5  . Highest education level: Not on file   Occupational History  . 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 Smoking status: Never Smoker  . Smokeless tobacco: Never Used  Vaping Use  . Vaping Use: Never used  Substance and Sexual Activity  . Alcohol use: No  . Drug use: No  . Sexual activity: Not Currently  Other Topics Concern  . Not on file  Social History Narrative   Pt lives at home alone   Husband w/ Alzheimer's passed away   Right-handed   Drinks 1-2 cups of coffee daily   Social Determinants of Health   Financial Resource Strain: Not on file  Food Insecurity: Not on file  Transportation Needs: Not on file  Physical Activity: Not on file  Stress: Not on file  Social Connections: Not on file  Intimate Partner Violence: Not on file   Family History  Problem Relation Age of Onset  . Pneumonia Mother   . Brain cancer Father   . Tremor Father   . Arthritis Sister   . COPD Sister   . Emphysema Brother   . COPD Brother   . Tremor Brother   . Lung cancer Brother   . Heart disease Brother   . Liver cancer Brother   . Diabetes Other         1 child has diabetes at age 65 and the other 4 are healthy    Objective: Office vital signs reviewed. BP 130/75   Pulse 73   Temp 98.2 F (36.8 C) (Temporal)   Ht 5\' 4"  (1.626 Debra)   Wt 157 lb 12.8 oz (71.6 kg)   SpO2 98%   BMI 27.09 kg/Debra   Physical Examination:  General: Awake, alert, elderly female, No acute distress HEENT: Normal, sclera white, MMM Cardio: regular rate and rhythm, S1S2 heard, no murmurs appreciated Pulm: clear to auscultation bilaterally, no wheezes, rhonchi or rales; normal work of breathing on room air Extremities: warm, well perfused, 1+ pitting edema, compression hose in place. No cyanosis or clubbing  MSK: slow gait  Assessment/ Plan: 87 y.o. female   Type 2 diabetes mellitus with hyperlipidemia ([place]) - Plan: Bayer DCA Hb A1c Waived, LDL Cholesterol, Direct, metFORMIN (GLUCOPHAGE)  1000 MG tablet  Hypertension associated with diabetes ([place])  Vitamin D deficiency - Plan: VITAMIN D 25 Hydroxy (Vit-D Deficiency, Fractures)  Sugar too tightly controlled for age.  A1c 7.0.  Recommended taking metformin 1000mg  only once daily.  Suspect that BGs may be playing a part in her fatigue. Plan for recheck in 3 months.  BP controlled upon recheck.  Check Vit D level.  No orders of the defined types were placed in this encounter.  No orders of the defined types were placed in this encounter.    [name], DO Tristan Schroeder  [place] [phone]

## 2020-11-19 NOTE — Patient Instructions (Addendum)
Sugar is technically in a perfect range (for someone in their 60's) but I think it's a little too low for your age.  I am recommending only taking Metformin ONCE per day now with your largest meal of the day.  Hopefully this will help with your stomach troubles as well.  Your A1c goal is closer to 7.5-8.0 range.  7.0 is too low for you.   Blood pressure was a little high today.  Make sure you are watching your salt intake.

## 2020-11-20 LAB — LDL CHOLESTEROL, DIRECT: LDL Direct: 60 mg/dL (ref 0–99)

## 2020-11-20 LAB — VITAMIN D 25 HYDROXY (VIT D DEFICIENCY, FRACTURES): Vit D, 25-Hydroxy: 48 ng/mL (ref 30.0–100.0)

## 2020-12-10 ENCOUNTER — Other Ambulatory Visit: Payer: Self-pay | Admitting: Family Medicine

## 2021-01-08 ENCOUNTER — Other Ambulatory Visit: Payer: Self-pay

## 2021-01-08 ENCOUNTER — Encounter: Payer: Self-pay | Admitting: Nurse Practitioner

## 2021-01-08 ENCOUNTER — Ambulatory Visit (INDEPENDENT_AMBULATORY_CARE_PROVIDER_SITE_OTHER): Payer: Medicare Other | Admitting: Nurse Practitioner

## 2021-01-08 VITALS — BP 133/79 | HR 80 | Temp 97.8°F | Resp 20 | Ht 64.0 in | Wt 159.0 lb

## 2021-01-08 DIAGNOSIS — L03116 Cellulitis of left lower limb: Secondary | ICD-10-CM | POA: Diagnosis not present

## 2021-01-08 MED ORDER — FUROSEMIDE 20 MG PO TABS: 20.0000 mg | ORAL_TABLET | Freq: Every day | ORAL | 3 refills | Status: DC

## 2021-01-08 MED ORDER — CEPHALEXIN 500 MG PO CAPS: 500.0000 mg | ORAL_CAPSULE | Freq: Three times a day (TID) | ORAL | 0 refills | Status: DC

## 2021-01-08 NOTE — Progress Notes (Signed)
   Subjective:    Patient ID: Debra Phillips, female    DOB: 1929/03/14, 87 y.o.   MRN: [id number]   Chief Complaint: Bilateral feet and leg swelling and Shortness of Breath   HPI Patient come sin c/o bil lower leg swelling with SOB. Her left leg is more swollen then right and is red and hot to touch. She  Has tried propping legs up but they still are swollen.  Lab Results  Component Value Date   CREATININE 0.82 10/08/2020   Wt Readings from Last 3 Encounters:  01/08/21 159 lb (72.1 kg)  11/19/20 157 lb 12.8 oz (71.6 kg)  10/08/20 155 lb (70.3 kg)     Review of Systems  Constitutional: Negative for diaphoresis.  Eyes: Negative for pain.  Respiratory: Positive for shortness of breath.   Cardiovascular: Positive for leg swelling. Negative for chest pain and palpitations.  Gastrointestinal: Negative for abdominal pain.  Endocrine: Negative for polydipsia.  Skin: Negative for rash.  Neurological: Negative for dizziness, weakness and headaches.  Hematological: Does not bruise/bleed easily.  All other systems reviewed and are negative.      Objective:   Physical Exam Vitals and nursing note reviewed.  Constitutional:      Appearance: She is obese.  Pulmonary:     Effort: Pulmonary effort is normal.     Breath sounds: Normal breath sounds.  Musculoskeletal:     Right lower leg: Edema (2+) present.     Left lower leg: Edema (2+ with erythema and hot to touch) present.  Skin:    General: Skin is warm.  Neurological:     General: No focal deficit present.     Mental Status: She is alert and oriented to person, place, and time.  Psychiatric:        Behavior: Behavior normal.    BP 133/79   Pulse 80   Temp 97.8 F (36.6 C) (Temporal)   Resp 20   Ht 5\' 4"  (1.626 m)   Wt 159 lb (72.1 kg)   SpO2 97%   BMI 27.29 kg/m       Assessment & Plan:  Debra Phillips in today with chief complaint of Bilateral feet and leg swelling and Shortness of Breath   1.  Cellulitis of left lower extremity Elevate leg when sitting Unna boot removed in 3 days - furosemide (LASIX) 20 MG tablet; Take 1 tablet (20 mg total) by mouth daily.  Dispense: 30 tablet; Refill: 3 - cephALEXin (KEFLEX) 500 MG capsule; Take 1 capsule (500 mg total) by mouth 3 (three) times daily.  Dispense: 30 capsule; Refill: 0    The above assessment and management plan was discussed with the patient. The patient verbalized understanding of and has agreed to the management plan. Patient is aware to call the clinic if symptoms persist or worsen. Patient is aware when to return to the clinic for a follow-up visit. Patient educated on when it is appropriate to go to the emergency department.   Mary-Margaret Hassell Done, FNP

## 2021-01-08 NOTE — Patient Instructions (Signed)

## 2021-01-11 ENCOUNTER — Other Ambulatory Visit: Payer: Self-pay

## 2021-01-11 ENCOUNTER — Encounter: Payer: Self-pay | Admitting: Family Medicine

## 2021-01-11 ENCOUNTER — Ambulatory Visit (INDEPENDENT_AMBULATORY_CARE_PROVIDER_SITE_OTHER): Payer: Medicare Other | Admitting: Family Medicine

## 2021-01-11 VITALS — BP 145/77 | HR 78 | Temp 97.8°F | Ht 64.0 in | Wt 160.0 lb

## 2021-01-11 DIAGNOSIS — L03116 Cellulitis of left lower limb: Secondary | ICD-10-CM | POA: Diagnosis not present

## 2021-01-11 DIAGNOSIS — I951 Orthostatic hypotension: Secondary | ICD-10-CM

## 2021-01-11 NOTE — Progress Notes (Signed)
Subjective: CC: f/u cellulitis PCP: [name], DO Debra Phillips is a 87 y.o. female presenting to clinic today for:  1.  Cellulitis Patient was seen earlier this week for a left lower leg cellulitis.  She was placed on Keflex and Lasix was renewed to help with edema.  She is here for [employer] removal.  She notes that she has been feeling nauseated swimmy headed for the last several days.  She denies any fevers, oozing from the leg but does note little spots that are itchy.  She wonders if she is getting bitten by mosquitoes outside.  There is ticks in the yard.  ROS: Per HPI  Allergies  Allergen Reactions  . Codeine Nausea And Vomiting  . Doxycycline Hives  . Other Nausea Only    Almost all antibiotics  . Sulfonamide Derivatives Hives  . Vancomycin Itching   Past Medical History:  Diagnosis Date  . Abnormality of gait 01/29/2016  . Aortic root dilatation (HCC)    Mild  . Cataract   . Diabetes mellitus without complication ([place])    type 2  . Dyslipidemia   . Dyspnea    with exertion  . Dysrhythmia    hx of occ palpitations  . Esophageal stricture    s/p dilation  . GERD (gastroesophageal reflux disease)   . Headache   . History of hiatal hernia   . Hyperlipidemia   . Hypertension   . Macular pucker, left eye   . Osteoarthritis   . Osteoporosis   . Tinnitus    left ear last week or so    Current Outpatient Medications:  .  aspirin EC 81 MG tablet, Take 81 mg by mouth daily., Disp: , Rfl:  .  calcium carbonate (OS-CAL) 600 MG TABS tablet, Take 600 mg by mouth 2 (two) times daily with a meal., Disp: , Rfl:  .  cephALEXin (KEFLEX) 500 MG capsule, Take 1 capsule (500 mg total) by mouth 3 (three) times daily., Disp: 30 capsule, Rfl: 0 .  Cholecalciferol (VITAMIN D3) 10 MCG (400 UNIT) CAPS, Take by mouth., Disp: , Rfl:  .  diltiazem (CARDIZEM CD) 180 MG 24 hr capsule, Take 1 capsule (180 mg total) by mouth daily., Disp: 90 capsule, Rfl: 3 .  estradiol  (ESTRACE) 0.1 MG/GM vaginal cream, SMARTSIG:Sparingly Vaginal Twice a Week, Disp: , Rfl:  .  furosemide (LASIX) 20 MG tablet, Take 1 tablet (20 mg total) by mouth daily., Disp: 30 tablet, Rfl: 3 .  hydroxypropyl methylcellulose / hypromellose (ISOPTO TEARS / GONIOVISC) 2.5 % ophthalmic solution, Place 1 drop into both eyes 3 (three) times daily as needed for dry eyes., Disp: , Rfl:  .  Lancets (ONETOUCH ULTRASOFT) lancets, CHECK BLOOD SUGAR ONCE A DAY, Disp: 50 each, Rfl: 9 .  lisinopril (ZESTRIL) 40 MG tablet, TAKE 1 TABLET DAILY, Disp: 90 tablet, Rfl: 1 .  metFORMIN (GLUCOPHAGE) 1000 MG tablet, Take 1 tablet (1,000 mg total) by mouth daily. (with your largest meal of the day), Disp: 90 tablet, Rfl: 3 .  multivitamin-lutein (OCUVITE-LUTEIN) CAPS capsule, Take 1 capsule by mouth daily., Disp: , Rfl:  .  MYRBETRIQ 25 MG TB24 tablet, Take 25 mg by mouth daily., Disp: , Rfl:  .  nitroGLYCERIN (NITROSTAT) 0.4 MG SL tablet, Place 1 tablet (0.4 mg total) under the tongue every 5 (five) minutes as needed for chest pain., Disp: 25 tablet, Rfl: 3 .  ONETOUCH ULTRA test strip, Check BS daily Dx E11.69, Disp: 100 strip,  Rfl: 3 .  pantoprazole (PROTONIX) 40 MG tablet, Take 40 mg by mouth daily., Disp: , Rfl:  .  pravastatin (PRAVACHOL) 40 MG tablet, Take 1 tablet (40 mg total) by mouth daily., Disp: 90 tablet, Rfl: 3 .  sodium chloride (OCEAN) 0.65 % SOLN nasal spray, Place 1 spray into both nostrils as needed for congestion., Disp: 30 mL, Rfl: 0 Social History   Socioeconomic History  . Marital status: Widowed    Spouse name: Not on file  . Number of children: 5  . Years of education: 5  . Highest education level: Not on file  Occupational History  . 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 Smoking status: Never Smoker  . Smokeless tobacco: Never Used  Vaping Use  . Vaping Use: Never used  Substance and Sexual Activity  . Alcohol use: No  . Drug  use: No  . Sexual activity: Not Currently  Other Topics Concern  . Not on file  Social History Narrative   Pt lives at home alone   Husband w/ Alzheimer's passed away   Right-handed   Drinks 1-2 cups of coffee daily   Social Determinants of Health   Financial Resource Strain: Not on file  Food Insecurity: Not on file  Transportation Needs: Not on file  Physical Activity: Not on file  Stress: Not on file  Social Connections: Not on file  Intimate Partner Violence: Not on file   Family History  Problem Relation Age of Onset  . Pneumonia Mother   . Brain cancer Father   . Tremor Father   . Arthritis Sister   . COPD Sister   . Emphysema Brother   . COPD Brother   . Tremor Brother   . Lung cancer Brother   . Heart disease Brother   . Liver cancer Brother   . Diabetes Other         1 child has diabetes at age 81 and the other 4 are healthy    Objective: Office vital signs reviewed. BP (!) 145/77   Pulse 78   Temp 97.8 F (36.6 C)   Ht 5\' 4"  (1.626 m)   Wt 160 lb (72.6 kg)   SpO2 98%   BMI 27.46 kg/m   Physical Examination:  General: Awake, alert, No acute distress Cardio: regular rate and rhythm, S1S2 heard, no murmurs appreciated Pulm: clear to auscultation bilaterally, no wheezes, rhonchi or rales; normal work of breathing on room air Extremities: warm, well perfused, trace to 1+ pitting edema noted to mid shins bilaterally Skin: Erythema noted to bilateral shins.  No warmth.  No skin breakdown.  She has a few insect bites noted along the distal aspects of the legs bilaterally  Orthostatic VS for the past 72 hrs (Last 3 readings):  Orthostatic BP Orthostatic Pulse  01/11/21 0959 (!) 165/111 84  01/11/21 0953 130/90 79  01/11/21 0952 153/89 75     Assessment/ Plan: 87 y.o. female   Cellulitis of left lower extremity  Orthostasis  Cellulitis appears to be improving.  The edema is better.  The Unna boot was removed today and no replacement needed today.   Continue Keflex.  Unsure if the nausea she was experiencing is due to the dizziness in the setting of orthostasis or if this is secondary to the Keflex but encouraged her to eat with each dose and hydrate adequately.  Orthostasis was evident upon orthostatic vital signs today.  We discussed changing positions very slowly.  If this continues to be an issue may need to consider discontinuing the Lasix totally and dealing with her edema strictly with conservative measures.  No orders of the defined types were placed in this encounter.  No orders of the defined types were placed in this encounter.    [name], DO [place] [phone]

## 2021-01-11 NOTE — Patient Instructions (Addendum)
Leg is looking good.  Make sure to keep your legs up when you are seated so you don't get real swollen.  You can put either cortisone cream or benadryl cream on your insect bites so you won't scratch and cause infections.  You're getting dizzy is called orthostasis (this is when your blood pressure drops when you switch positions). Get up slowly and make sure you are getting enough water to drink.  Orthostatic Hypotension Blood pressure is a measurement of how strongly, or weakly, your blood is pressing against the walls of your arteries. Orthostatic hypotension is a sudden drop in blood pressure that happens when you quickly change positions, such as when you get up from sitting or lying down. Arteries are blood vessels that carry blood from your heart throughout your body. When blood pressure is too low, you may not get enough blood to your brain or to the rest of your organs. This can cause weakness, light-headedness, rapid heartbeat, and fainting. This can last for just a few seconds or for up to a few minutes. Orthostatic hypotension is usually not a serious problem. However, if it happens frequently or gets worse, it may be a sign of something more serious. What are the causes? This condition may be caused by:  Sudden changes in posture, such as standing up quickly after you have been sitting or lying down.  Blood loss.  Loss of body fluids (dehydration).  Heart problems.  Hormone (endocrine) problems.  Pregnancy.  Severe infection.  Lack of certain nutrients.  Severe allergic reactions (anaphylaxis).  Certain medicines, such as blood pressure medicine or medicines that make the body lose excess fluids (diuretics). Sometimes, this condition can be caused by not taking medicine as directed, such as taking too much of a certain medicine. What increases the risk? The following factors may make you more likely to develop this condition:  Age. Risk increases as you get  older.  Conditions that affect the heart or the central nervous system.  Taking certain medicines, such as blood pressure medicine or diuretics.  Being pregnant. What are the signs or symptoms? Symptoms of this condition may include:  Weakness.  Light-headedness.  Dizziness.  Blurred vision.  Fatigue.  Rapid heartbeat.  Fainting, in severe cases. How is this diagnosed? This condition is diagnosed based on:  Your medical history.  Your symptoms.  Your blood pressure measurement. Your health care provider will check your blood pressure when you are: ? Lying down. ? Sitting. ? Standing. A blood pressure reading is recorded as two numbers, such as "120 over 80" (or 120/80). The first ("top") number is called the systolic pressure. It is a measure of the pressure in your arteries as your heart beats. The second ("bottom") number is called the diastolic pressure. It is a measure of the pressure in your arteries when your heart relaxes between beats. Blood pressure is measured in a unit called mm Hg. Healthy blood pressure for most adults is 120/80. If your blood pressure is below 90/60, you may be diagnosed with hypotension. Other information or tests that may be used to diagnose orthostatic hypotension include:  Your other vital signs, such as your heart rate and temperature.  Blood tests.  Tilt table test. For this test, you will be safely secured to a table that moves you from a lying position to an upright position. Your heart rhythm and blood pressure will be monitored during the test. How is this treated? This condition may be treated by:  Changing your diet. This may involve eating more salt (sodium) or drinking more water.  Taking medicines to raise your blood pressure.  Changing the dosage of certain medicines you are taking that might be lowering your blood pressure.  Wearing compression stockings. These stockings help to prevent blood clots and reduce swelling  in your legs. In some cases, you may need to go to the hospital for:  Fluid replacement. This means you will receive fluids through an IV.  Blood replacement. This means you will receive donated blood through an IV (transfusion).  Treating an infection or heart problems, if this applies.  Monitoring. You may need to be monitored while medicines that you are taking wear off. Follow these instructions at home: Eating and drinking  Drink enough fluid to keep your urine pale yellow.  Eat a healthy diet, and follow instructions from your health care provider about eating or drinking restrictions. A healthy diet includes: ? Fresh fruits and vegetables. ? Whole grains. ? Lean meats. ? Low-fat dairy products.  Eat extra salt only as directed. Do not add extra salt to your diet unless your health care provider told you to do that.  Eat frequent, small meals.  Avoid standing up suddenly after eating.   Medicines  Take over-the-counter and prescription medicines only as told by your health care provider. ? Follow instructions from your health care provider about changing the dosage of your current medicines, if this applies. ? Do not stop or adjust any of your medicines on your own. General instructions  Wear compression stockings as told by your health care provider.  Get up slowly from lying down or sitting positions. This gives your blood pressure a chance to adjust.  Avoid hot showers and excessive heat as directed by your health care provider.  Return to your normal activities as told by your health care provider. Ask your health care provider what activities are safe for you.  Do not use any products that contain nicotine or tobacco, such as cigarettes, e-cigarettes, and chewing tobacco. If you need help quitting, ask your health care provider.  Keep all follow-up visits as told by your health care provider. This is important.   Contact a health care provider if  you:  Vomit.  Have diarrhea.  Have a fever for more than 2-3 days.  Feel more thirsty than usual.  Feel weak and tired. Get help right away if you:  Have chest pain.  Have a fast or irregular heartbeat.  Develop numbness in any part of your body.  Cannot move your arms or your legs.  Have trouble speaking.  Become sweaty or feel light-headed.  Faint.  Feel short of breath.  Have trouble staying awake.  Feel confused. Summary  Orthostatic hypotension is a sudden drop in blood pressure that happens when you quickly change positions.  Orthostatic hypotension is usually not a serious problem.  It is diagnosed by having your blood pressure taken lying down, sitting, and then standing.  It may be treated by changing your diet or adjusting your medicines. This information is not intended to replace advice given to you by your health care provider. Make sure you discuss any questions you have with your health care provider. Document Revised: 02/04/2018 Document Reviewed: 02/04/2018 Elsevier Patient Education  [place].

## 2021-02-11 DIAGNOSIS — R0602 Shortness of breath: Secondary | ICD-10-CM | POA: Diagnosis not present

## 2021-02-12 ENCOUNTER — Ambulatory Visit (INDEPENDENT_AMBULATORY_CARE_PROVIDER_SITE_OTHER): Payer: Medicare Other | Admitting: Family

## 2021-02-12 ENCOUNTER — Encounter: Payer: Self-pay | Admitting: Family

## 2021-02-12 ENCOUNTER — Ambulatory Visit (INDEPENDENT_AMBULATORY_CARE_PROVIDER_SITE_OTHER): Payer: Medicare Other

## 2021-02-12 VITALS — BP 151/74 | HR 79 | Temp 97.9°F | Wt 160.8 lb

## 2021-02-12 DIAGNOSIS — R0602 Shortness of breath: Secondary | ICD-10-CM

## 2021-02-12 NOTE — Progress Notes (Signed)
   Subjective:    Patient ID: Debra Phillips, female    DOB: 1928-09-27, 87 y.o.   MRN: [id number]  Chief Complaint  Patient presents with   Shortness of Breath    Shortness of Breath This is a new problem. The current episode started more than 1 month ago. The problem occurs every few minutes. Associated symptoms include leg swelling. Pertinent negatives include no chest pain, claudication, fever, headaches, sore throat, sputum production or wheezing. The symptoms are aggravated by any activity. She has tried rest for the symptoms. The treatment provided mild relief.     Review of Systems  Constitutional:  Negative for fever.  HENT:  Negative for sore throat.   Respiratory:  Positive for shortness of breath. Negative for sputum production and wheezing.   Cardiovascular:  Positive for leg swelling. Negative for chest pain and claudication.  Neurological:  Negative for headaches.  All other systems reviewed and are negative.     Objective:   Physical Exam Vitals reviewed.  Constitutional:      General: She is not in acute distress.    Appearance: She is well-developed.  HENT:     Head: Normocephalic and atraumatic.     Right Ear: Tympanic membrane normal.     Left Ear: Tympanic membrane normal.  Eyes:     Pupils: Pupils are equal, round, and reactive to light.  Neck:     Thyroid: No thyromegaly.  Cardiovascular:     Rate and Rhythm: Normal rate and regular rhythm.     Heart sounds: Murmur heard.  Pulmonary:     Effort: Pulmonary effort is normal. No respiratory distress.     Breath sounds: Normal breath sounds. No wheezing.  Abdominal:     General: Bowel sounds are normal. There is no distension.     Palpations: Abdomen is soft.     Tenderness: There is no abdominal tenderness.  Musculoskeletal:        General: No tenderness or deformity. Normal range of motion.     Cervical back: Normal range of motion and neck supple.     Right lower leg: Right lower leg edema:  2+.     Left lower leg: Edema (2+) present.  Skin:    General: Skin is warm and dry.  Neurological:     Mental Status: She is alert and oriented to person, place, and time.     Cranial Nerves: No cranial nerve deficit.     Deep Tendon Reflexes: Reflexes are normal and symmetric.  Psychiatric:        Behavior: Behavior normal.        Thought Content: Thought content normal.        Judgment: Judgment normal.      BP (!) 151/74   Pulse 79   Temp 97.9 F (36.6 C) (Oral)   SpO2 97% Comment: room air     Assessment & Plan:  KARELLY DEWALT comes in today with chief complaint of Shortness of Breath   Diagnosis and orders addressed:  1. SOB (shortness of breath) Worrisome for CHF? Given age and chronic SOB.  - Novel Coronavirus, NAA ([facility]) - CMP14+EGFR - CBC with Differential/Platelet - Brain natriuretic peptide - DG Chest 2 View; Future   Labs pending Low salt diet Keep follow up with PCP  Evelina Dun, FNP

## 2021-02-13 ENCOUNTER — Telehealth: Payer: Self-pay | Admitting: Family Medicine

## 2021-02-13 LAB — CBC WITH DIFFERENTIAL/PLATELET
Basophils Absolute: 0 10*3/uL (ref 0.0–0.2)
Basos: 1 %
EOS (ABSOLUTE): 0.2 10*3/uL (ref 0.0–0.4)
Eos: 3 %
Hematocrit: 34.3 % (ref 34.0–46.6)
Hemoglobin: 11.7 g/dL (ref 11.1–15.9)
Immature Grans (Abs): 0 10*3/uL (ref 0.0–0.1)
Immature Granulocytes: 0 %
Lymphocytes Absolute: 2.5 10*3/uL (ref 0.7–3.1)
Lymphs: 37 %
MCH: 30.2 pg (ref 26.6–33.0)
MCHC: 34.1 g/dL (ref 31.5–35.7)
MCV: 89 fL (ref 79–97)
Monocytes Absolute: 0.7 10*3/uL (ref 0.1–0.9)
Monocytes: 10 %
Neutrophils Absolute: 3.3 10*3/uL (ref 1.4–7.0)
Neutrophils: 49 %
Platelets: 307 10*3/uL (ref 150–450)
RBC: 3.87 x10E6/uL (ref 3.77–5.28)
RDW: 13.1 % (ref 11.7–15.4)
WBC: 6.8 10*3/uL (ref 3.4–10.8)

## 2021-02-13 LAB — CMP14+EGFR
ALT: 11 IU/L (ref 0–32)
AST: 13 IU/L (ref 0–40)
Albumin/Globulin Ratio: 2.1 (ref 1.2–2.2)
Albumin: 4 g/dL (ref 3.5–4.6)
Alkaline Phosphatase: 70 IU/L (ref 44–121)
BUN/Creatinine Ratio: 18 (ref 12–28)
BUN: 13 mg/dL (ref 10–36)
Bilirubin Total: 0.2 mg/dL (ref 0.0–1.2)
CO2: 24 mmol/L (ref 20–29)
Calcium: 9.2 mg/dL (ref 8.7–10.3)
Chloride: 99 mmol/L (ref 96–106)
Creatinine, Ser: 0.73 mg/dL (ref 0.57–1.00)
Globulin, Total: 1.9 g/dL (ref 1.5–4.5)
Glucose: 196 mg/dL — ABNORMAL HIGH (ref 65–99)
Potassium: 4.3 mmol/L (ref 3.5–5.2)
Sodium: 136 mmol/L (ref 134–144)
Total Protein: 5.9 g/dL — ABNORMAL LOW (ref 6.0–8.5)
eGFR: 78 mL/min/{1.73_m2} (ref >59)

## 2021-02-13 LAB — SARS-COV-2, NAA 2 DAY TAT

## 2021-02-13 LAB — BRAIN NATRIURETIC PEPTIDE: BNP: 80.3 pg/mL (ref 0.0–100.0)

## 2021-02-13 LAB — NOVEL CORONAVIRUS, NAA: SARS-CoV-2, NAA: NOT DETECTED

## 2021-02-13 NOTE — Telephone Encounter (Signed)
PT RESULTS ARE NOT BACK, WILL CALL WHEN WE GET THEM BACK.

## 2021-02-15 ENCOUNTER — Other Ambulatory Visit: Payer: Self-pay

## 2021-02-15 ENCOUNTER — Ambulatory Visit (INDEPENDENT_AMBULATORY_CARE_PROVIDER_SITE_OTHER): Payer: Medicare Other

## 2021-02-15 ENCOUNTER — Ambulatory Visit (INDEPENDENT_AMBULATORY_CARE_PROVIDER_SITE_OTHER): Payer: Medicare Other | Admitting: Family Medicine

## 2021-02-15 ENCOUNTER — Encounter: Payer: Self-pay | Admitting: Family Medicine

## 2021-02-15 VITALS — BP 165/79 | HR 75 | Temp 98.1°F | Ht 64.0 in | Wt 161.6 lb

## 2021-02-15 DIAGNOSIS — M25532 Pain in left wrist: Secondary | ICD-10-CM | POA: Diagnosis not present

## 2021-02-15 DIAGNOSIS — M79642 Pain in left hand: Secondary | ICD-10-CM

## 2021-02-15 MED ORDER — PREDNISONE 20 MG PO TABS: 20.0000 mg | ORAL_TABLET | Freq: Every day | ORAL | 0 refills | Status: DC

## 2021-02-15 NOTE — Progress Notes (Signed)
Chief Complaint  Patient presents with   Hand Pain    HPI  Patient presents today for left hand pain that makes it hard for her to use it. NKI. Has lots of arthritis in back, hips and knees.  Patient is right-handed.  She points to the radial styloid portion of the wrist as point of maximal pain.  PMH: Smoking status noted ROS: Per HPI  Objective: BP (!) 165/79   Pulse 75   Temp 98.1 F (36.7 C)   Ht 5\' 4"  (1.626 m)   Wt 161 lb 9.6 oz (73.3 kg)   SpO2 98%   BMI 27.74 kg/m  Gen: NAD, alert, cooperative with exam HEENT: NCAT,  Ext: No edema, warm.  There is full passive range of motion of the hand and wrist.  There is marked tenderness at the radial styloid. Neuro: Alert and oriented, No gross deficits X-ray left hand: Preliminary read: There is severe osteoarthritis changes throughout most of the joints of the wrist and hand.  There is a tiny chip at the radial styloid.  Will send to radiology for verification. Assessment and plan:  1. Hand pain, left   2. Wrist pain, left     Meds ordered this encounter  Medications   predniSONE (DELTASONE) 20 MG tablet    Sig: Take 1 tablet (20 mg total) by mouth daily with breakfast.    Dispense:  10 tablet    Refill:  0    Orders Placed This Encounter  Procedures   DG Hand Complete Left    Standing Status:   Future    Number of Occurrences:   1    Standing Expiration Date:   02/15/2022    Order Specific Question:   Reason for Exam (SYMPTOM  OR DIAGNOSIS REQUIRED)    Answer:   left hand pain    Order Specific Question:   Preferred imaging location?    Answer:   Internal  Wrist brace for the next 2 to 4 weeks.  Take it off for range of motion exercises every 1/2 hour or so.  Follow up as needed.  Claretta Fraise, MD

## 2021-02-20 ENCOUNTER — Ambulatory Visit (INDEPENDENT_AMBULATORY_CARE_PROVIDER_SITE_OTHER): Payer: Medicare Other | Admitting: Family Medicine

## 2021-02-20 ENCOUNTER — Encounter: Payer: Self-pay | Admitting: Family Medicine

## 2021-02-20 VITALS — BP 149/91 | HR 71 | Temp 97.5°F | Ht 64.0 in | Wt 161.2 lb

## 2021-02-20 DIAGNOSIS — E1169 Type 2 diabetes mellitus with other specified complication: Secondary | ICD-10-CM

## 2021-02-20 DIAGNOSIS — R5382 Chronic fatigue, unspecified: Secondary | ICD-10-CM

## 2021-02-20 DIAGNOSIS — G3184 Mild cognitive impairment, so stated: Secondary | ICD-10-CM

## 2021-02-20 DIAGNOSIS — R102 Pelvic and perineal pain: Secondary | ICD-10-CM

## 2021-02-20 DIAGNOSIS — E785 Hyperlipidemia, unspecified: Secondary | ICD-10-CM | POA: Diagnosis not present

## 2021-02-20 DIAGNOSIS — N3000 Acute cystitis without hematuria: Secondary | ICD-10-CM | POA: Diagnosis not present

## 2021-02-20 LAB — URINALYSIS, ROUTINE W REFLEX MICROSCOPIC
Bilirubin, UA: NEGATIVE
Glucose, UA: NEGATIVE
Ketones, UA: NEGATIVE
Nitrite, UA: POSITIVE — AB
Protein,UA: NEGATIVE
RBC, UA: NEGATIVE
Specific Gravity, UA: 1.01 (ref 1.005–1.030)
Urobilinogen, Ur: 0.2 mg/dL (ref 0.2–1.0)
pH, UA: 5.5 (ref 5.0–7.5)

## 2021-02-20 LAB — MICROSCOPIC EXAMINATION: RBC, Urine: NONE SEEN /hpf (ref 0–2)

## 2021-02-20 LAB — BAYER DCA HB A1C WAIVED: HB A1C (BAYER DCA - WAIVED): 8 % — ABNORMAL HIGH (ref <7.0)

## 2021-02-20 MED ORDER — CEPHALEXIN 500 MG PO CAPS: 500.0000 mg | ORAL_CAPSULE | Freq: Two times a day (BID) | ORAL | 0 refills | Status: AC

## 2021-02-20 NOTE — Patient Instructions (Signed)
BIOTENE mouthwash to help with dry mouth  I will call you with your lab results.  KEEP DRINKING water.  Tea will cause dryness to be worse because it has caffeine.

## 2021-02-20 NOTE — Progress Notes (Addendum)
Subjective: CC: DM PCP: [name], DO [id number] M Tinkle is a 87 y.o. female presenting to clinic today for:  1. Type 2 Diabetes with hypertension, hyperlipidemia:  At last visit we reduced her metformin to 1000 mg daily given very tight control of sugar in this 87 year old woman.   She reports that since reducing the metformin dose her fatigue has been okay.  She is not quite sure if she is seeing much difference but admits that she suffers from chronic fatigue.  She does report increased urine output and some intermittent pelvic pain.  No reports of dysuria or hematuria.  She is currently being treated with prednisone for back pain and has noticed blood sugars up into the 200s.  Last eye exam: UTD Last foot exam: needs Last A1c:  Lab Results  Component Value Date   HGBA1C 7.0 (H) 11/19/2020   Nephropathy screen indicated?: UTD Last flu, zoster and/or pneumovax:  Immunization History  Administered Date(s) Administered   Fluad Quad(high Dose 65+) 07/20/2019   Influenza, High Dose Seasonal PF 06/13/2016, 06/18/2017, 06/29/2018   Influenza,inj,Quad PF,6+ Mos 06/15/2015   Influenza-Unspecified 04/25/2013, 05/10/2014, 06/13/2016, 06/18/2017, 06/29/2018, 07/13/2020   Moderna Sars-Covid-2 Vaccination 10/10/2019, 11/21/2019   Pneumococcal Conjugate-13 08/24/2013   Pneumococcal Polysaccharide-23 06/17/1996   Td 02/17/2017   Tdap 02/17/2017   Zoster, Live 03/17/2014    ROS: Per HPI  Allergies  Allergen Reactions   Codeine Nausea And Vomiting   Doxycycline Hives   Other Nausea Only    Almost all antibiotics   Sulfonamide Derivatives Hives   Vancomycin Itching   Past Medical History:  Diagnosis Date   Abnormality of gait 01/29/2016   Aortic root dilatation (HCC)    Mild   Cataract    Diabetes mellitus without complication (HCC)    type 2   Dyslipidemia    Dyspnea    with exertion   Dysrhythmia    hx of occ palpitations   Esophageal stricture    s/p dilation    GERD (gastroesophageal reflux disease)    Headache    History of hiatal hernia    Hyperlipidemia    Hypertension    Macular pucker, left eye    Osteoarthritis    Osteoporosis    Tinnitus    left ear last week or so    Current Outpatient Medications:    aspirin EC 81 MG tablet, Take 81 mg by mouth daily., Disp: , Rfl:    calcium carbonate (OS-CAL) 600 MG TABS tablet, Take 600 mg by mouth 2 (two) times daily with a meal., Disp: , Rfl:    Cholecalciferol (VITAMIN D3) 10 MCG (400 UNIT) CAPS, Take by mouth., Disp: , Rfl:    diltiazem (CARDIZEM CD) 180 MG 24 hr capsule, Take 1 capsule (180 mg total) by mouth daily., Disp: 90 capsule, Rfl: 3   estradiol (ESTRACE) 0.1 MG/GM vaginal cream, SMARTSIG:Sparingly Vaginal Twice a Week, Disp: , Rfl:    furosemide (LASIX) 20 MG tablet, Take 1 tablet (20 mg total) by mouth daily., Disp: 30 tablet, Rfl: 3   hydroxypropyl methylcellulose / hypromellose (ISOPTO TEARS / GONIOVISC) 2.5 % ophthalmic solution, Place 1 drop into both eyes 3 (three) times daily as needed for dry eyes., Disp: , Rfl:    Lancets (ONETOUCH ULTRASOFT) lancets, CHECK BLOOD SUGAR ONCE A DAY, Disp: 50 each, Rfl: 9   lisinopril (ZESTRIL) 40 MG tablet, TAKE 1 TABLET DAILY, Disp: 90 tablet, Rfl: 1   metFORMIN (GLUCOPHAGE) 1000 MG tablet, Take 1 tablet (  1,000 mg total) by mouth daily. (with your largest meal of the day), Disp: 90 tablet, Rfl: 3   multivitamin-lutein (OCUVITE-LUTEIN) CAPS capsule, Take 1 capsule by mouth daily., Disp: , Rfl:    MYRBETRIQ 25 MG TB24 tablet, Take 25 mg by mouth daily., Disp: , Rfl:    nitroGLYCERIN (NITROSTAT) 0.4 MG SL tablet, Place 1 tablet (0.4 mg total) under the tongue every 5 (five) minutes as needed for chest pain., Disp: 25 tablet, Rfl: 3   ONETOUCH ULTRA test strip, Check BS daily Dx E11.69, Disp: 100 strip, Rfl: 3   pantoprazole (PROTONIX) 40 MG tablet, Take 40 mg by mouth daily., Disp: , Rfl:    pravastatin (PRAVACHOL) 40 MG tablet, Take 1 tablet  (40 mg total) by mouth daily., Disp: 90 tablet, Rfl: 3   predniSONE (DELTASONE) 20 MG tablet, Take 1 tablet (20 mg total) by mouth daily with breakfast., Disp: 10 tablet, Rfl: 0   sodium chloride (OCEAN) 0.65 % SOLN nasal spray, Place 1 spray into both nostrils as needed for congestion., Disp: 30 mL, Rfl: 0 Social History   Socioeconomic History   Marital status: Widowed    Spouse name: Not on file   Number of children: 5   Years of education: 5   Highest education level: Not on file  Occupational History   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    Comment: Retired  Tobacco Use   Smoking status: Never   Smokeless tobacco: Never  Scientific laboratory technician Use: Never used  Substance and Sexual Activity   Alcohol use: No   Drug use: No   Sexual activity: Not Currently  Other Topics Concern   Not on file  Social History Narrative   Pt lives at home alone   Husband w/ Alzheimer's passed away   Right-handed   Drinks 1-2 cups of coffee daily   Social Determinants of Health   Financial Resource Strain: Not on file  Food Insecurity: Not on file  Transportation Needs: Not on file  Physical Activity: Not on file  Stress: Not on file  Social Connections: Not on file  Intimate Partner Violence: Not on file   Family History  Problem Relation Age of Onset   Pneumonia Mother    Brain cancer Father    Tremor Father    Arthritis Sister    COPD Sister    Emphysema Brother    COPD Brother    Tremor Brother    Lung cancer Brother    Heart disease Brother    Liver cancer Brother    Diabetes Other         1 child has diabetes at age 73 and the other 4 are healthy    Objective: Office vital signs reviewed. BP (!) 149/91   Pulse 71   Temp (!) 97.5 F (36.4 C)   Ht 5\' 4"  (1.626 m)   Wt 161 lb 3.2 oz (73.1 kg)   SpO2 97%   BMI 27.67 kg/m   Physical Examination:  General: Awake, alert, well-appearing elderly female, No acute distress HEENT: Normal; sclera  white Cardio: regular rate and rhythm, S1S2 heard, no murmurs appreciated Pulm: clear to auscultation bilaterally, no wheezes, rhonchi or rales; normal work of breathing on room air GU: Mild suprapubic tenderness Extremities: warm, well perfused, lower extremities with compression hose in place. MSK: Slow, antalgic gait.  Utilizes cane for ambulation Neuro: Somewhat repetitive.  She is pleasant and interactive however and does not appear  to be responding to internal stimuli.  Follows commands.  Hard of hearing  Assessment/ Plan: 87 y.o. female   Type 2 diabetes mellitus with hyperlipidemia ([place]) - Plan: Bayer DCA Hb A1c Waived  Pelvic pain - Plan: Urinalysis, Routine w reflex microscopic  Chronic fatigue  Mild cognitive impairment  Check sugar.  She previously had controlled sugar but I thought that it might be too tightly controlled.  Currently being treated with prednisone for back pain.  Continue metformin for now  Check urinalysis given pelvic pain and increased urinary frequency.  Urinary frequency may be related to recent initiation of Lasix however.  Chronically fatigued I think this has a lot to do with her advanced age.  Her metabolic work-up has been unremarkable and she is demonstrated no anemia or other electrolyte imbalances  Mild cognitive impairment as demonstrated by a little repetitiveness during today's visit.  She otherwise is pretty sharp and independent.  Urinalysis consistent with an acute urinary tract infection.  She has nitrate positive and leukocytes present.  Many bacteria under microscopy.  Keflex sent.  She will follow-up as needed  Orders Placed This Encounter  Procedures   Bayer DCA Hb A1c Waived   Urinalysis, Routine w reflex microscopic   Meds ordered this encounter  Medications   cephALEXin (KEFLEX) 500 MG capsule    Sig: Take 1 capsule (500 mg total) by mouth 2 (two) times daily for 7 days.    Dispense:  14 capsule    Refill:  [place], DO [place] [phone]

## 2021-02-20 NOTE — Addendum Note (Signed)
Addended by: [name] on: 02/20/2021 02:55 PM   Modules accepted: Orders

## 2021-03-14 ENCOUNTER — Telehealth: Payer: Self-pay

## 2021-03-14 NOTE — Telephone Encounter (Signed)
Pt vm not set up

## 2021-03-14 NOTE — Telephone Encounter (Signed)
-----   Message from [name], DO sent at 03/14/2021 12:54 PM EDT ----- Can we see if we can get this patient a visit set up with Almyra Free to review BGs?  Apparently, having some elevations?

## 2021-03-18 ENCOUNTER — Ambulatory Visit (INDEPENDENT_AMBULATORY_CARE_PROVIDER_SITE_OTHER): Payer: Medicare Other | Admitting: Family Medicine

## 2021-03-18 ENCOUNTER — Encounter: Payer: Self-pay | Admitting: Family Medicine

## 2021-03-18 ENCOUNTER — Other Ambulatory Visit: Payer: Self-pay

## 2021-03-18 VITALS — BP 138/64 | HR 74 | Temp 97.6°F | Ht 64.0 in | Wt 158.6 lb

## 2021-03-18 DIAGNOSIS — E1165 Type 2 diabetes mellitus with hyperglycemia: Secondary | ICD-10-CM | POA: Diagnosis not present

## 2021-03-18 MED ORDER — DAPAGLIFLOZIN PROPANEDIOL 5 MG PO TABS: 5.0000 mg | ORAL_TABLET | Freq: Every day | ORAL | 1 refills | Status: DC

## 2021-03-18 NOTE — Progress Notes (Signed)
Subjective:  Patient ID: Debra Phillips, female    DOB: 23-Mar-1929  Age: 87 y.o. MRN: [id number]  CC: Blood Sugar Problem   HPI [name] presents forFollow-up of diabetes. Patient checks blood sugar at home.    Glucose has been climbing lately. Goes up to 300 or so. Increased her own metformin back to 1000 mg BID last week, but it didn't help. Can't see well enough to drive.  Patient denies symptoms such as polyuria, polydipsia, excessive hunger, nausea No significant hypoglycemic spells noted. Medications reviewed. Pt reports taking them regularly without complication/adverse reaction being reported today.    History Debra Phillips has a past medical history of Abnormality of gait (01/29/2016), Aortic root dilatation (HCC), Cataract, Diabetes mellitus without complication ([place]), Dyslipidemia, Dyspnea, Dysrhythmia, Esophageal stricture, GERD (gastroesophageal reflux disease), Headache, History of hiatal hernia, Hyperlipidemia, Hypertension, Macular pucker, left eye, Osteoarthritis, Osteoporosis, and Tinnitus.   She has a past surgical history that includes Total knee arthroplasty; Shoulder surgery; Knee arthroscopy (2004); Lumbar fusion (2000,2003); Radial head arthroplasty (5/12); Appendectomy; Cholecystectomy (1993); Cardiac catheterization (2010); Fracture surgery; Kyphosis surgery; Hardware Removal (12/10/2011); Eye surgery; Eye surgery; Back surgery; Abdominal hysterectomy; Vaginal prolapse repair (N/A, 08/28/2016); and Cystoscopy (N/A, 08/28/2016).   Her family history includes Arthritis in her sister; Brain cancer in her father; COPD in her brother and sister; Diabetes in an other family member; Emphysema in her brother; Heart disease in her brother; Liver cancer in her brother; Lung cancer in her brother; Pneumonia in her mother; Tremor in her brother and father.She reports that she has never smoked. She has never used smokeless tobacco. She reports that she does not drink alcohol and does  not use drugs.  Current Outpatient Medications on File Prior to Visit  Medication Sig Dispense Refill   aspirin EC 81 MG tablet Take 81 mg by mouth daily.     calcium carbonate (OS-CAL) 600 MG TABS tablet Take 600 mg by mouth 2 (two) times daily with a meal.     Cholecalciferol (VITAMIN D3) 10 MCG (400 UNIT) CAPS Take by mouth.     diltiazem (CARDIZEM CD) 180 MG 24 hr capsule Take 1 capsule (180 mg total) by mouth daily. 90 capsule 3   estradiol (ESTRACE) 0.1 MG/GM vaginal cream SMARTSIG:Sparingly Vaginal Twice a Week     furosemide (LASIX) 20 MG tablet Take 1 tablet (20 mg total) by mouth daily. 30 tablet 3   hydroxypropyl methylcellulose / hypromellose (ISOPTO TEARS / GONIOVISC) 2.5 % ophthalmic solution Place 1 drop into both eyes 3 (three) times daily as needed for dry eyes.     Lancets (ONETOUCH ULTRASOFT) lancets CHECK BLOOD SUGAR ONCE A DAY 50 each 9   lisinopril (ZESTRIL) 40 MG tablet TAKE 1 TABLET DAILY 90 tablet 1   metFORMIN (GLUCOPHAGE) 1000 MG tablet Take 1 tablet (1,000 mg total) by mouth daily. (with your largest meal of the day) 90 tablet 3   multivitamin-lutein (OCUVITE-LUTEIN) CAPS capsule Take 1 capsule by mouth daily.     MYRBETRIQ 25 MG TB24 tablet Take 25 mg by mouth daily.     nitroGLYCERIN (NITROSTAT) 0.4 MG SL tablet Place 1 tablet (0.4 mg total) under the tongue every 5 (five) minutes as needed for chest pain. 25 tablet 3   ONETOUCH ULTRA test strip Check BS daily Dx E11.69 100 strip 3   pantoprazole (PROTONIX) 40 MG tablet Take 40 mg by mouth daily.     pravastatin (PRAVACHOL) 40 MG tablet Take 1 tablet (40 mg total)  by mouth daily. 90 tablet 3   predniSONE (DELTASONE) 20 MG tablet Take 1 tablet (20 mg total) by mouth daily with breakfast. 10 tablet 0   sodium chloride (OCEAN) 0.65 % SOLN nasal spray Place 1 spray into both nostrils as needed for congestion. 30 mL 0   No current facility-administered medications on file prior to visit.    ROS Review of Systems   Constitutional: Negative.   HENT: Negative.    Eyes:  Negative for visual disturbance.  Respiratory:  Negative for shortness of breath.   Cardiovascular:  Negative for chest pain.  Gastrointestinal:  Negative for abdominal pain.  Musculoskeletal:  Positive for arthralgias (left leg).   Objective:  BP 138/64   Pulse 74   Temp 97.6 F (36.4 C)   Ht '5\' 4"'  (3.817 m)   Wt 158 lb 9.6 oz (71.9 kg)   SpO2 97%   BMI 27.22 kg/m   BP Readings from Last 3 Encounters:  03/18/21 138/64  02/20/21 (!) 149/91  02/15/21 (!) 165/79    Wt Readings from Last 3 Encounters:  03/18/21 158 lb 9.6 oz (71.9 kg)  02/20/21 161 lb 3.2 oz (73.1 kg)  02/15/21 161 lb 9.6 oz (73.3 kg)     Physical Exam Constitutional:      General: She is not in acute distress.    Appearance: She is well-developed.  Cardiovascular:     Rate and Rhythm: Normal rate and regular rhythm.  Pulmonary:     Breath sounds: Normal breath sounds.  Musculoskeletal:        General: Normal range of motion.  Skin:    General: Skin is warm and dry.  Neurological:     Mental Status: She is alert and oriented to person, place, and time.   02/12/2021, eGFR = 78,  02/20/2021, Hb A1c = 8.0   Assessment & Plan:   Diamond was seen today for blood sugar problem.  Diagnoses and all orders for this visit:  Type 2 diabetes mellitus with hyperglycemia, without long-term current use of insulin ([place])  Other orders -     dapagliflozin propanediol (FARXIGA) 5 MG TABS tablet; Take 1 tablet (5 mg total) by mouth daily before breakfast.     I am having Debra Phillips start on dapagliflozin propanediol. I am also having her maintain her nitroGLYCERIN, aspirin EC, hydroxypropyl methylcellulose / hypromellose, onetouch ultrasoft, OneTouch Ultra, pantoprazole, Myrbetriq, diltiazem, pravastatin, multivitamin-lutein, sodium chloride, estradiol, calcium carbonate, Vitamin D3, metFORMIN, lisinopril, furosemide, and predniSONE.  Meds  ordered this encounter  Medications   dapagliflozin propanediol (FARXIGA) 5 MG TABS tablet    Sig: Take 1 tablet (5 mg total) by mouth daily before breakfast.    Dispense:  90 tablet    Refill:  1     Follow-up: Return in about 1 month (around 04/18/2021) for with Dr. Lajuana Ripple, diabetes.  Claretta Fraise, M.D.

## 2021-03-18 NOTE — Patient Instructions (Signed)
https://www.diabeteseducator.org/docs/default-source/living-with-diabetes/conquering-the-grocery-store-v1.pdf?sfvrsn=4">  Carbohydrate Counting for Diabetes Mellitus, Adult Carbohydrate counting is a method of keeping track of how many carbohydrates you eat. Eating carbohydrates naturally increases the amount of sugar (glucose) in the blood. Counting how many carbohydrates you eat improves your bloodglucose control, which helps you manage your diabetes. It is important to know how many carbohydrates you can safely have in each meal. This is different for every person. A dietitian can help you make a meal plan and calculate how many carbohydrates you should have at each meal andsnack. What foods contain carbohydrates? Carbohydrates are found in the following foods: Grains, such as breads and cereals. Dried beans and soy products. Starchy vegetables, such as potatoes, peas, and corn. Fruit and fruit juices. Milk and yogurt. Sweets and snack foods, such as cake, cookies, candy, chips, and soft drinks. How do I count carbohydrates in foods? There are two ways to count carbohydrates in food. You can read food labels or learn standard serving sizes of foods. You can use either of the methods or acombination of both. Using the Nutrition Facts label The Nutrition Facts list is included on the labels of almost all packaged foods and beverages in the [place] It includes: The serving size. Information about nutrients in each serving, including the grams (g) of carbohydrate per serving. To use the Nutrition Facts: Decide how many servings you will have. Multiply the number of servings by the number of carbohydrates per serving. The resulting number is the total amount of carbohydrates that you will be having. Learning the standard serving sizes of foods When you eat carbohydrate foods that are not packaged or do not include Nutrition Facts on the label, you need to measure the servings in order to count the  amount of carbohydrates. Measure the foods that you will eat with a food scale or measuring cup, if needed. Decide how many standard-size servings you will eat. Multiply the number of servings by 15. For foods that contain carbohydrates, one serving equals 15 g of carbohydrates. For example, if you eat 2 cups or 10 oz (300 g) of strawberries, you will have eaten 2 servings and 30 g of carbohydrates (2 servings x 15 g = 30 g). For foods that have more than one food mixed, such as soups and casseroles, you must count the carbohydrates in each food that is included. The following list contains standard serving sizes of common carbohydrate-rich foods. Each of these servings has about 15 g of carbohydrates: 1 slice of bread. 1 six-inch (15 cm) tortilla. ? cup or 2 oz (53 g) cooked rice or pasta.  cup or 3 oz (85 g) cooked or canned, drained and rinsed beans or lentils.  cup or 3 oz (85 g) starchy vegetable, such as peas, corn, or squash.  cup or 4 oz (120 g) hot cereal.  cup or 3 oz (85 g) boiled or mashed potatoes, or  or 3 oz (85 g) of a large baked potato.  cup or 4 fl oz (118 mL) fruit juice. 1 cup or 8 fl oz (237 mL) milk. 1 small or 4 oz (106 g) apple.  or 2 oz (63 g) of a medium banana. 1 cup or 5 oz (150 g) strawberries. 3 cups or 1 oz (24 g) popped popcorn. What is an example of carbohydrate counting? To calculate the number of carbohydrates in this sample meal, follow the stepsshown below. Sample meal 3 oz (85 g) chicken breast. ? cup or 4 oz (106 g) brown rice.    cup or 3 oz (85 g) corn. 1 cup or 8 fl oz (237 mL) milk. 1 cup or 5 oz (150 g) strawberries with sugar-free whipped topping. Carbohydrate calculation Identify the foods that contain carbohydrates: Rice. Corn. Milk. Strawberries. Calculate how many servings you have of each food: 2 servings rice. 1 serving corn. 1 serving milk. 1 serving strawberries. Multiply each number of servings by 15 g: 2 servings  rice x 15 g = 30 g. 1 serving corn x 15 g = 15 g. 1 serving milk x 15 g = 15 g. 1 serving strawberries x 15 g = 15 g. Add together all of the amounts to find the total grams of carbohydrates eaten: 30 g + 15 g + 15 g + 15 g = 75 g of carbohydrates total. What are tips for following this plan? Shopping Develop a meal plan and then make a shopping list. Buy fresh and frozen vegetables, fresh and frozen fruit, dairy, eggs, beans, lentils, and whole grains. Look at food labels. Choose foods that have more fiber and less sugar. Avoid processed foods and foods with added sugars. Meal planning Aim to have the same amount of carbohydrates at each meal and for each snack time. Plan to have regular, balanced meals and snacks. Where to find more information American Diabetes Association: www.diabetes.org Centers for Disease Control and Prevention: www.cdc.gov Summary Carbohydrate counting is a method of keeping track of how many carbohydrates you eat. Eating carbohydrates naturally increases the amount of sugar (glucose) in the blood. Counting how many carbohydrates you eat improves your blood glucose control, which helps you manage your diabetes. A dietitian can help you make a meal plan and calculate how many carbohydrates you should have at each meal and snack. This information is not intended to replace advice given to you by your health care provider. Make sure you discuss any questions you have with your healthcare provider. Document Revised: 08/11/2019 Document Reviewed: 08/12/2019 Elsevier Patient Education  2021 Elsevier Inc.  

## 2021-04-11 ENCOUNTER — Other Ambulatory Visit: Payer: Self-pay

## 2021-04-11 ENCOUNTER — Encounter: Payer: Self-pay | Admitting: Family Medicine

## 2021-04-11 ENCOUNTER — Other Ambulatory Visit: Payer: Self-pay | Admitting: Family Medicine

## 2021-04-11 ENCOUNTER — Ambulatory Visit (INDEPENDENT_AMBULATORY_CARE_PROVIDER_SITE_OTHER): Payer: Medicare Other | Admitting: Family Medicine

## 2021-04-11 VITALS — BP 134/79 | HR 77 | Temp 97.4°F | Ht 64.0 in | Wt 153.6 lb

## 2021-04-11 DIAGNOSIS — N3000 Acute cystitis without hematuria: Secondary | ICD-10-CM | POA: Diagnosis not present

## 2021-04-11 DIAGNOSIS — R300 Dysuria: Secondary | ICD-10-CM

## 2021-04-11 LAB — URINALYSIS, COMPLETE
Bilirubin, UA: NEGATIVE
Ketones, UA: NEGATIVE
Nitrite, UA: POSITIVE — AB
Protein,UA: NEGATIVE
RBC, UA: NEGATIVE
Specific Gravity, UA: <1.005 — ABNORMAL LOW (ref 1.005–1.030)
Urobilinogen, Ur: 0.2 mg/dL (ref 0.2–1.0)
pH, UA: 5 (ref 5.0–7.5)

## 2021-04-11 MED ORDER — CEFDINIR 300 MG PO CAPS: 300.0000 mg | ORAL_CAPSULE | Freq: Two times a day (BID) | ORAL | 0 refills | Status: AC

## 2021-04-11 NOTE — Progress Notes (Signed)
Assessment & Plan:  1. Acute cystitis without hematuria Education provided on UTIs. Encouraged adequate hydration.  - cefdinir (OMNICEF) 300 MG capsule; Take 1 capsule (300 mg total) by mouth 2 (two) times daily for 7 days.  Dispense: 14 capsule; Refill: 0  2. Dysuria - Urinalysis, Complete - Urine dipstick shows positive for nitrates and positive for leukocytes.   - Urine Culture   Follow up plan: Return as scheduled with PCP.  Hendricks Limes, MSN, APRN, FNP-C [facility]  Subjective:   Patient ID: Debra Phillips, female    DOB: 06-02-1929, 87 y.o.   MRN: [id number]  HPI: Debra Phillips is a 87 y.o. female presenting on 04/11/2021 for Urinary Frequency and Dysuria  Urinary Tract Infection: Patient complains of dysuria and frequency She has had symptoms for a few weeks. Patient denies back pain and fever. Patient does have a history of recurrent UTI.     ROS: Negative unless specifically indicated above in HPI.   Relevant past medical history reviewed and updated as indicated.   Allergies and medications reviewed and updated.   Current Outpatient Medications:    aspirin EC 81 MG tablet, Take 81 mg by mouth daily., Disp: , Rfl:    calcium carbonate (OS-CAL) 600 MG TABS tablet, Take 600 mg by mouth 2 (two) times daily with a meal., Disp: , Rfl:    Cholecalciferol (VITAMIN D3) 10 MCG (400 UNIT) CAPS, Take by mouth., Disp: , Rfl:    dapagliflozin propanediol (FARXIGA) 5 MG TABS tablet, Take 1 tablet (5 mg total) by mouth daily before breakfast., Disp: 90 tablet, Rfl: 1   diltiazem (CARDIZEM CD) 180 MG 24 hr capsule, Take 1 capsule (180 mg total) by mouth daily., Disp: 90 capsule, Rfl: 3   estradiol (ESTRACE) 0.1 MG/GM vaginal cream, APPLY A PEA SIZE AMOUNT PER VAGINA 2-3 NIGHTS A WEEK -FOR VAGINAL USE-, Disp: 42.5 g, Rfl: 6   furosemide (LASIX) 20 MG tablet, Take 1 tablet (20 mg total) by mouth daily., Disp: 30 tablet, Rfl: 3   hydroxypropyl  methylcellulose / hypromellose (ISOPTO TEARS / GONIOVISC) 2.5 % ophthalmic solution, Place 1 drop into both eyes 3 (three) times daily as needed for dry eyes., Disp: , Rfl:    Lancets (ONETOUCH ULTRASOFT) lancets, CHECK BLOOD SUGAR ONCE A DAY, Disp: 50 each, Rfl: 9   lisinopril (ZESTRIL) 40 MG tablet, TAKE 1 TABLET DAILY, Disp: 90 tablet, Rfl: 1   metFORMIN (GLUCOPHAGE) 1000 MG tablet, Take 1 tablet (1,000 mg total) by mouth daily. (with your largest meal of the day), Disp: 90 tablet, Rfl: 3   multivitamin-lutein (OCUVITE-LUTEIN) CAPS capsule, Take 1 capsule by mouth daily., Disp: , Rfl:    MYRBETRIQ 25 MG TB24 tablet, Take 25 mg by mouth daily., Disp: , Rfl:    nitroGLYCERIN (NITROSTAT) 0.4 MG SL tablet, Place 1 tablet (0.4 mg total) under the tongue every 5 (five) minutes as needed for chest pain., Disp: 25 tablet, Rfl: 3   ONETOUCH ULTRA test strip, Check BS daily Dx E11.69, Disp: 100 strip, Rfl: 3   pantoprazole (PROTONIX) 40 MG tablet, Take 40 mg by mouth daily., Disp: , Rfl:    pravastatin (PRAVACHOL) 40 MG tablet, Take 1 tablet (40 mg total) by mouth daily., Disp: 90 tablet, Rfl: 3   predniSONE (DELTASONE) 20 MG tablet, Take 1 tablet (20 mg total) by mouth daily with breakfast., Disp: 10 tablet, Rfl: 0   sodium chloride (OCEAN) 0.65 % SOLN nasal spray, Place 1 spray  into both nostrils as needed for congestion., Disp: 30 mL, Rfl: 0  Allergies  Allergen Reactions   Codeine Nausea And Vomiting   Doxycycline Hives   Other Nausea Only    Almost all antibiotics   Sulfonamide Derivatives Hives   Vancomycin Itching    Objective:   BP 134/79   Pulse 77   Temp (!) 97.4 F (36.3 C) (Temporal)   Ht '5\' 4"'$  (1.626 m)   Wt 153 lb 9.6 oz (69.7 kg)   SpO2 97%   BMI 26.37 kg/m    Physical Exam Vitals reviewed.  Constitutional:      General: She is not in acute distress.    Appearance: Normal appearance. She is not ill-appearing, toxic-appearing or diaphoretic.  HENT:     Head:  Normocephalic and atraumatic.  Eyes:     General: No scleral icterus.       Right eye: No discharge.        Left eye: No discharge.     Conjunctiva/sclera: Conjunctivae normal.  Cardiovascular:     Rate and Rhythm: Normal rate.  Pulmonary:     Effort: Pulmonary effort is normal. No respiratory distress.  Musculoskeletal:        General: Normal range of motion.     Cervical back: Normal range of motion.  Skin:    General: Skin is warm and dry.     Capillary Refill: Capillary refill takes less than 2 seconds.  Neurological:     General: No focal deficit present.     Mental Status: She is alert and oriented to person, place, and time. Mental status is at baseline.  Psychiatric:        Mood and Affect: Mood normal.        Behavior: Behavior normal.        Thought Content: Thought content normal.        Judgment: Judgment normal.

## 2021-04-16 LAB — URINE CULTURE

## 2021-04-17 DIAGNOSIS — H52223 Regular astigmatism, bilateral: Secondary | ICD-10-CM | POA: Diagnosis not present

## 2021-04-17 DIAGNOSIS — H5213 Myopia, bilateral: Secondary | ICD-10-CM | POA: Diagnosis not present

## 2021-04-17 DIAGNOSIS — H353130 Nonexudative age-related macular degeneration, bilateral, stage unspecified: Secondary | ICD-10-CM | POA: Diagnosis not present

## 2021-04-17 DIAGNOSIS — E119 Type 2 diabetes mellitus without complications: Secondary | ICD-10-CM | POA: Diagnosis not present

## 2021-04-17 DIAGNOSIS — H524 Presbyopia: Secondary | ICD-10-CM | POA: Diagnosis not present

## 2021-04-17 DIAGNOSIS — Z961 Presence of intraocular lens: Secondary | ICD-10-CM | POA: Diagnosis not present

## 2021-04-22 ENCOUNTER — Encounter: Payer: Self-pay | Admitting: Nurse Practitioner

## 2021-04-22 ENCOUNTER — Ambulatory Visit (INDEPENDENT_AMBULATORY_CARE_PROVIDER_SITE_OTHER): Payer: Medicare Other | Admitting: Nurse Practitioner

## 2021-04-22 DIAGNOSIS — B373 Candidiasis of vulva and vagina: Secondary | ICD-10-CM

## 2021-04-22 DIAGNOSIS — B3731 Acute candidiasis of vulva and vagina: Secondary | ICD-10-CM | POA: Insufficient documentation

## 2021-04-22 MED ORDER — FLUCONAZOLE 150 MG PO TABS: 150.0000 mg | ORAL_TABLET | Freq: Once | ORAL | 0 refills | Status: AC

## 2021-04-22 NOTE — Progress Notes (Signed)
   Virtual Visit  Note Due to COVID-19 pandemic this visit was conducted virtually. This visit type was conducted due to national recommendations for restrictions regarding the COVID-19 Pandemic (e.g. social distancing, sheltering in place) in an effort to limit this patient's exposure and mitigate transmission in our community. All issues noted in this document were discussed and addressed.  A physical exam was not performed with this format.  I connected with Debra Phillips on 04/22/21 at 2:00 pm by telephone and verified that I am speaking with the correct person using two identifiers. Debra Phillips is currently located at home during visit. The provider, Ivy Lynn, NP is located in their office at time of visit.  I discussed the limitations, risks, security and privacy concerns of performing an evaluation and management service by telephone and the availability of in person appointments. I also discussed with the patient that there may be a patient responsible charge related to this service. The patient expressed understanding and agreed to proceed.   History and Present Illness:  Patient developed vaginal yeast infection after completing antibiotic.  No fever, nausea, no flank pain, dysuria or body ache associated with current symptoms.  Patient is reporting a copious amount of discharge.    Review of Systems  Constitutional: Negative.   HENT: Negative.    Respiratory: Negative.    Cardiovascular: Negative.   Genitourinary:  Negative for dysuria, flank pain, frequency, hematuria and urgency.  Skin: Negative.   All other systems reviewed and are negative.   Observations/Objective: Televisit patient not in distress.  Assessment and Plan: Patient develops yeast infection after completing antibiotic.  Patient is reporting copious amount of discharge in the last few days.  Fluconazole 150 mg tablet sent to pharmacy.  Education provided to patient's daughter.  Follow Up  Instructions: Follow-up with worsening unresolved symptoms.    I discussed the assessment and treatment plan with the patient. The patient was provided an opportunity to ask questions and all were answered. The patient agreed with the plan and demonstrated an understanding of the instructions.   The patient was advised to call back or seek an in-person evaluation if the symptoms worsen or if the condition fails to improve as anticipated.  The above assessment and management plan was discussed with the patient. The patient verbalized understanding of and has agreed to the management plan. Patient is aware to call the clinic if symptoms persist or worsen. Patient is aware when to return to the clinic for a follow-up visit. Patient educated on when it is appropriate to go to the emergency department.   Time call ended: 2:07 PM  I provided 7 minutes of  non face-to-face time during this encounter.    Ivy Lynn, NP

## 2021-04-22 NOTE — Assessment & Plan Note (Signed)
Patient develops yeast infection after completing antibiotic.  Patient is reporting copious amount of discharge in the last few days.  Fluconazole 150 mg tablet sent to pharmacy.  Education provided to patient's daughter.

## 2021-05-20 ENCOUNTER — Ambulatory Visit (INDEPENDENT_AMBULATORY_CARE_PROVIDER_SITE_OTHER): Payer: Medicare Other | Admitting: Nurse Practitioner

## 2021-05-20 ENCOUNTER — Encounter: Payer: Self-pay | Admitting: Nurse Practitioner

## 2021-05-20 ENCOUNTER — Other Ambulatory Visit: Payer: Self-pay

## 2021-05-20 VITALS — BP 144/76 | HR 72 | Temp 97.4°F | Ht 64.0 in | Wt 151.8 lb

## 2021-05-20 DIAGNOSIS — R300 Dysuria: Secondary | ICD-10-CM

## 2021-05-20 LAB — URINALYSIS, COMPLETE
Bilirubin, UA: NEGATIVE
Ketones, UA: NEGATIVE
Nitrite, UA: NEGATIVE
Protein,UA: NEGATIVE
RBC, UA: NEGATIVE
Specific Gravity, UA: 1.015 (ref 1.005–1.030)
Urobilinogen, Ur: 0.2 mg/dL (ref 0.2–1.0)
pH, UA: 5.5 (ref 5.0–7.5)

## 2021-05-20 LAB — MICROSCOPIC EXAMINATION
RBC, Urine: NONE SEEN /hpf (ref 0–2)
Renal Epithel, UA: NONE SEEN /hpf

## 2021-05-20 MED ORDER — CEPHALEXIN 500 MG PO CAPS: 500.0000 mg | ORAL_CAPSULE | Freq: Two times a day (BID) | ORAL | 0 refills | Status: DC

## 2021-05-20 NOTE — Progress Notes (Signed)
Acute Office Visit  Subjective:    Patient ID: Debra Phillips, female    DOB: May 12, 1929, 87 y.o.   MRN: [id number]  Chief Complaint  Patient presents with   Dysuria    Dysuria  This is a recurrent problem. The current episode started in the past 7 days. The problem has been unchanged. The quality of the pain is described as burning. The pain is moderate. There has been no fever. Associated symptoms include flank pain, frequency and nausea. Pertinent negatives include no chills or discharge. She has tried nothing for the symptoms. Her past medical history is significant for recurrent UTIs.    Past Medical History:  Diagnosis Date   Abnormality of gait 01/29/2016   Aortic root dilatation (HCC)    Mild   Cataract    Diabetes mellitus without complication (HCC)    type 2   Dyslipidemia    Dyspnea    with exertion   Dysrhythmia    hx of occ palpitations   Esophageal stricture    s/p dilation   GERD (gastroesophageal reflux disease)    Headache    History of hiatal hernia    Hyperlipidemia    Hypertension    Macular pucker, left eye    Osteoarthritis    Osteoporosis    Tinnitus    left ear last week or so    Past Surgical History:  Procedure Laterality Date   ABDOMINAL HYSTERECTOMY     partial   APPENDECTOMY     BACK SURGERY     lower x 1   CARDIAC CATHETERIZATION  2010   both cataracts   CHOLECYSTECTOMY  1993   CYSTOSCOPY N/A 08/28/2016   Procedure: CYSTOSCOPY;  Surgeon: Carolan Clines, MD;  Location: [facility];  Service: Urology;  Laterality: N/A;   EYE SURGERY     cataracts   EYE SURGERY     for macular pucker   FRACTURE SURGERY     rt wrist2010   HARDWARE REMOVAL  12/10/2011   Procedure: HARDWARE REMOVAL;  Surgeon: Schuyler Amor, MD;  Location: [facility];  Service: Orthopedics;  Laterality: Right;  radial head   KNEE ARTHROSCOPY  2004   Left and right   KYPHOSIS SURGERY     LUMBAR FUSION  2000,2003   RADIAL HEAD ARTHROPLASTY   5/12   orif rt radial head   SHOULDER SURGERY     Right rotator cuff epair   TOTAL KNEE ARTHROPLASTY     Left   VAGINAL PROLAPSE REPAIR N/A 08/28/2016   Procedure: ANTERIOR VAGINAL VAULT SUSPENSION WITH SACROSPINOUS FIXATION;  Surgeon: Carolan Clines, MD;  Location: [facility];  Service: Urology;  Laterality: N/A;    Family History  Problem Relation Age of Onset   Pneumonia Mother    Brain cancer Father    Tremor Father    Arthritis Sister    COPD Sister    Emphysema Brother    COPD Brother    Tremor Brother    Lung cancer Brother    Heart disease Brother    Liver cancer Brother    Diabetes Other         1 child has diabetes at age 72 and the other 24 are healthy    Social History   Socioeconomic History   Marital status: Widowed    Spouse name: Not on file   Number of children: 5   Years of education: 5   Highest education level: Not on file  Occupational History   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    Comment: Retired  Tobacco Use   Smoking status: Never   Smokeless tobacco: Never  Scientific laboratory technician Use: Never used  Substance and Sexual Activity   Alcohol use: No   Drug use: No   Sexual activity: Not Currently  Other Topics Concern   Not on file  Social History Narrative   Pt lives at home alone   Husband w/ Alzheimer's passed away   Right-handed   Drinks 1-2 cups of coffee daily   Social Determinants of Health   Financial Resource Strain: Not on file  Food Insecurity: Not on file  Transportation Needs: Not on file  Physical Activity: Not on file  Stress: Not on file  Social Connections: Not on file  Intimate Partner Violence: Not on file    Outpatient Medications Prior to Visit  Medication Sig Dispense Refill   aspirin EC 81 MG tablet Take 81 mg by mouth daily.     calcium carbonate (OS-CAL) 600 MG TABS tablet Take 600 mg by mouth 2 (two) times daily with a meal.     Cholecalciferol (VITAMIN D3) 10 MCG (400 UNIT) CAPS Take by  mouth.     dapagliflozin propanediol (FARXIGA) 5 MG TABS tablet Take 1 tablet (5 mg total) by mouth daily before breakfast. 90 tablet 1   diltiazem (CARDIZEM CD) 180 MG 24 hr capsule Take 1 capsule (180 mg total) by mouth daily. 90 capsule 3   estradiol (ESTRACE) 0.1 MG/GM vaginal cream APPLY A PEA SIZE AMOUNT PER VAGINA 2-3 NIGHTS A WEEK -FOR VAGINAL USE- 42.5 g 6   furosemide (LASIX) 20 MG tablet Take 1 tablet (20 mg total) by mouth daily. 30 tablet 3   hydroxypropyl methylcellulose / hypromellose (ISOPTO TEARS / GONIOVISC) 2.5 % ophthalmic solution Place 1 drop into both eyes 3 (three) times daily as needed for dry eyes.     Lancets (ONETOUCH ULTRASOFT) lancets CHECK BLOOD SUGAR ONCE A DAY 50 each 9   lisinopril (ZESTRIL) 40 MG tablet TAKE 1 TABLET DAILY 90 tablet 1   metFORMIN (GLUCOPHAGE) 1000 MG tablet Take 1 tablet (1,000 mg total) by mouth daily. (with your largest meal of the day) 90 tablet 3   multivitamin-lutein (OCUVITE-LUTEIN) CAPS capsule Take 1 capsule by mouth daily.     MYRBETRIQ 25 MG TB24 tablet Take 25 mg by mouth daily.     nitroGLYCERIN (NITROSTAT) 0.4 MG SL tablet Place 1 tablet (0.4 mg total) under the tongue every 5 (five) minutes as needed for chest pain. 25 tablet 3   ONETOUCH ULTRA test strip Check BS daily Dx E11.69 100 strip 3   pantoprazole (PROTONIX) 40 MG tablet Take 40 mg by mouth daily.     pravastatin (PRAVACHOL) 40 MG tablet Take 1 tablet (40 mg total) by mouth daily. 90 tablet 3   predniSONE (DELTASONE) 20 MG tablet Take 1 tablet (20 mg total) by mouth daily with breakfast. 10 tablet 0   sodium chloride (OCEAN) 0.65 % SOLN nasal spray Place 1 spray into both nostrils as needed for congestion. 30 mL 0   No facility-administered medications prior to visit.    Allergies  Allergen Reactions   Codeine Nausea And Vomiting   Doxycycline Hives   Other Nausea Only    Almost all antibiotics   Sulfonamide Derivatives Hives   Vancomycin Itching    Review of  Systems  Constitutional:  Negative for chills.  Gastrointestinal:  Positive for nausea.  Genitourinary:  Positive for dysuria, flank pain and frequency.  All other systems reviewed and are negative.     Objective:    Physical Exam Vitals and nursing note reviewed.  Constitutional:      Appearance: Normal appearance.  HENT:     Head: Normocephalic.     Nose: Nose normal.  Eyes:     Conjunctiva/sclera: Conjunctivae normal.  Cardiovascular:     Rate and Rhythm: Normal rate and regular rhythm.     Pulses: Normal pulses.     Heart sounds: Normal heart sounds.  Pulmonary:     Effort: Pulmonary effort is normal.     Breath sounds: Normal breath sounds.  Abdominal:     General: Bowel sounds are normal.     Tenderness: There is right CVA tenderness.  Neurological:     Mental Status: She is alert and oriented to person, place, and time.    BP (!) 144/76   Pulse 72   Temp (!) 97.4 F (36.3 C)   Ht '5\' 4"'  (1.626 m)   Wt 151 lb 12.8 oz (68.9 kg)   SpO2 98%   BMI 26.06 kg/m  Wt Readings from Last 3 Encounters:  05/20/21 151 lb 12.8 oz (68.9 kg)  04/11/21 153 lb 9.6 oz (69.7 kg)  03/18/21 158 lb 9.6 oz (71.9 kg)    Health Maintenance Due  Topic Date Due   OPHTHALMOLOGY EXAM  09/19/2019   COVID-19 Vaccine (3 - Booster for Moderna series) 04/22/2020   FOOT EXAM  10/06/2020   INFLUENZA VACCINE  03/25/2021    There are no preventive care reminders to display for this patient.   Lab Results  Component Value Date   TSH 1.680 09/03/2018   Lab Results  Component Value Date   WBC 6.8 02/12/2021   HGB 11.7 02/12/2021   HCT 34.3 02/12/2021   MCV 89 02/12/2021   PLT 307 02/12/2021   Lab Results  Component Value Date   NA 136 02/12/2021   K 4.3 02/12/2021   CO2 24 02/12/2021   GLUCOSE 196 (H) 02/12/2021   BUN 13 02/12/2021   CREATININE 0.73 02/12/2021   BILITOT 0.2 02/12/2021   ALKPHOS 70 02/12/2021   AST 13 02/12/2021   ALT 11 02/12/2021   PROT 5.9 (L) 02/12/2021    ALBUMIN 4.0 02/12/2021   CALCIUM 9.2 02/12/2021   ANIONGAP 11 04/11/2019   EGFR 78 02/12/2021   Lab Results  Component Value Date   CHOL 167 09/03/2018   Lab Results  Component Value Date   HDL 73 09/03/2018   Lab Results  Component Value Date   LDLCALC 66 09/03/2018   Lab Results  Component Value Date   TRIG 141 09/03/2018   Lab Results  Component Value Date   CHOLHDL 2.3 09/03/2018   Lab Results  Component Value Date   HGBA1C 8.0 (H) 02/20/2021       Assessment & Plan:   Problem List Items Addressed This Visit       Other   Dysuria - Primary    Uncontrolled dysuria symptoms in the past few days with frequency, foul-smelling urine and pelvic pain.  Completed urinalysis trace leukocytes and few bacteria present.  Started patient on Keflex.  Urine cultures completed results pending.  Rx Keflex 500 mg tablet by mouth twice daily Sent to pharmacy.  Education provided to patient to follow-up with worsening unresolved symptoms.  Patient verbalized understanding.      Relevant  Medications   cephALEXin (KEFLEX) 500 MG capsule   Other Relevant Orders   Urinalysis, Complete   CULTURE, URINE COMPREHENSIVE     Meds ordered this encounter  Medications   cephALEXin (KEFLEX) 500 MG capsule    Sig: Take 1 capsule (500 mg total) by mouth 2 (two) times daily.    Dispense:  14 capsule    Refill:  0    Order Specific Question:   Supervising Provider    Answer:   [name] [[id number]]     Ivy Lynn, NP

## 2021-05-20 NOTE — Assessment & Plan Note (Signed)
Uncontrolled dysuria symptoms in the past few days with frequency, foul-smelling urine and pelvic pain.  Completed urinalysis trace leukocytes and few bacteria present.  Started patient on Keflex.  Urine cultures completed results pending.  Rx Keflex 500 mg tablet by mouth twice daily Sent to pharmacy.  Education provided to patient to follow-up with worsening unresolved symptoms.  Patient verbalized understanding.

## 2021-05-20 NOTE — Patient Instructions (Signed)
Dysuria ?Dysuria is pain or discomfort during urination. The pain or discomfort may be felt in the part of the body that drains urine from the bladder (urethra) or in the surrounding tissue of the genitals. The pain may also be felt in the groin area, lower abdomen, or lower back. ?You may have to urinate frequently or have the sudden feeling that you have to urinate (urgency). Dysuria can affect anyone, but it is more common in females. Dysuria can be caused by many different things, including: ?Urinary tract infection. ?Kidney stones or bladder stones. ?Certain STIs (sexually transmitted infections), such as chlamydia. ?Dehydration. ?Inflammation of the tissues of the vagina. ?Use of certain medicines. ?Use of certain soaps or scented products that cause irritation. ?Follow these instructions at home: ?Medicines ?Take over-the-counter and prescription medicines only as told by your health care provider. ?If you were prescribed an antibiotic medicine, take it as told by your health care provider. Do not stop taking the antibiotic even if you start to feel better. ?Eating and drinking ? ?Drink enough fluid to keep your urine pale yellow. ?Avoid caffeinated beverages, tea, and alcohol. These beverages can irritate the bladder and make dysuria worse. In males, alcohol may irritate the prostate. ?General instructions ?Watch your condition for any changes. ?Urinate often. Avoid holding urine for long periods of time. ?If you are female, you should wipe from front to back after urinating or having a bowel movement. Use each piece of toilet paper only once. ?Empty your bladder after sex. ?Keep all follow-up visits. This is important. ?If you had any tests done to find the cause of dysuria, it is up to you to get your test results. Ask your health care provider, or the department that is doing the test, when your results will be ready. ?Contact a health care provider if: ?You have a fever. ?You develop pain in your back or  sides. ?You have nausea or vomiting. ?You have blood in your urine. ?You are not urinating as often as you usually do. ?Get help right away if: ?Your pain is severe and not relieved with medicines. ?You cannot eat or drink without vomiting. ?You are confused. ?You have a rapid heartbeat while resting. ?You have shaking or chills. ?You feel extremely weak. ?Summary ?Dysuria is pain or discomfort while urinating. Many different conditions can lead to dysuria. ?If you have dysuria, you may have to urinate frequently or have the sudden feeling that you have to urinate (urgency). ?Watch your condition for any changes. Keep all follow-up visits. ?Make sure that you urinate often and drink enough fluid to keep your urine pale yellow. ?This information is not intended to replace advice given to you by your health care provider. Make sure you discuss any questions you have with your health care provider. ?Document Revised: 03/23/2020 Document Reviewed: 03/23/2020 ?Elsevier Patient Education ? 2022 Elsevier Inc. ? ?

## 2021-05-21 ENCOUNTER — Other Ambulatory Visit: Payer: Self-pay | Admitting: Nurse Practitioner

## 2021-05-21 DIAGNOSIS — L03116 Cellulitis of left lower limb: Secondary | ICD-10-CM

## 2021-05-24 ENCOUNTER — Encounter: Payer: Self-pay | Admitting: Family Medicine

## 2021-05-24 ENCOUNTER — Ambulatory Visit (INDEPENDENT_AMBULATORY_CARE_PROVIDER_SITE_OTHER): Payer: Medicare Other | Admitting: Family Medicine

## 2021-05-24 VITALS — BP 122/79 | HR 81 | Temp 97.5°F | Ht 64.0 in | Wt 151.8 lb

## 2021-05-24 DIAGNOSIS — I152 Hypertension secondary to endocrine disorders: Secondary | ICD-10-CM

## 2021-05-24 DIAGNOSIS — E1169 Type 2 diabetes mellitus with other specified complication: Secondary | ICD-10-CM | POA: Diagnosis not present

## 2021-05-24 DIAGNOSIS — E1165 Type 2 diabetes mellitus with hyperglycemia: Secondary | ICD-10-CM | POA: Diagnosis not present

## 2021-05-24 DIAGNOSIS — E785 Hyperlipidemia, unspecified: Secondary | ICD-10-CM

## 2021-05-24 DIAGNOSIS — Z23 Encounter for immunization: Secondary | ICD-10-CM | POA: Diagnosis not present

## 2021-05-24 DIAGNOSIS — E1159 Type 2 diabetes mellitus with other circulatory complications: Secondary | ICD-10-CM

## 2021-05-24 LAB — BAYER DCA HB A1C WAIVED: HB A1C (BAYER DCA - WAIVED): 7.7 % — ABNORMAL HIGH (ref 4.8–5.6)

## 2021-05-24 LAB — CMP14+EGFR
ALT: 12 IU/L (ref 0–32)
AST: 16 IU/L (ref 0–40)
Albumin/Globulin Ratio: 2.3 — ABNORMAL HIGH (ref 1.2–2.2)
Albumin: 4.3 g/dL (ref 3.5–4.6)
Alkaline Phosphatase: 69 IU/L (ref 44–121)
BUN/Creatinine Ratio: 15 (ref 12–28)
BUN: 14 mg/dL (ref 10–36)
Bilirubin Total: 0.5 mg/dL (ref 0.0–1.2)
CO2: 25 mmol/L (ref 20–29)
Calcium: 9.7 mg/dL (ref 8.7–10.3)
Chloride: 98 mmol/L (ref 96–106)
Creatinine, Ser: 0.93 mg/dL (ref 0.57–1.00)
Globulin, Total: 1.9 g/dL (ref 1.5–4.5)
Glucose: 224 mg/dL — ABNORMAL HIGH (ref 70–99)
Potassium: 4.4 mmol/L (ref 3.5–5.2)
Sodium: 138 mmol/L (ref 134–144)
Total Protein: 6.2 g/dL (ref 6.0–8.5)
eGFR: 58 mL/min/{1.73_m2} — ABNORMAL LOW (ref >59)

## 2021-05-24 LAB — CULTURE, URINE COMPREHENSIVE

## 2021-05-24 NOTE — Patient Instructions (Signed)
SUGAR IS AT GOAL FOR YOUR AGE! 7.7 today.  Great job Make sure you check your meds to see if you are taking Wilder Glade or not and call me and let me know. You got your flu shot today

## 2021-05-24 NOTE — Progress Notes (Signed)
Subjective: CC: DM PCP: [name], DO [id number] M Larcom is a 87 y.o. female presenting to clinic today for:  1. Type 2 Diabetes with hypertension, hyperlipidemia:  Compliant with metformin but is not sure if she is taking [place].  She admits that sometimes she has memory issues but does load all of her medication to a pillowcase each week.  Last eye exam: Needs Last foot exam: Needs Last A1c:  Lab Results  Component Value Date   HGBA1C 8.0 (H) 02/20/2021   Nephropathy screen indicated?:  Up-to-date Last flu, zoster and/or pneumovax:  Immunization History  Administered Date(s) Administered   Fluad Quad(high Dose 65+) 07/20/2019, 05/24/2021   Influenza, High Dose Seasonal PF 06/13/2016, 06/18/2017, 06/29/2018   Influenza,inj,Quad PF,6+ Mos 06/15/2015   Influenza-Unspecified 04/25/2013, 05/10/2014, 06/13/2016, 06/18/2017, 06/29/2018, 07/13/2020   Moderna Sars-Covid-2 Vaccination 10/10/2019, 11/21/2019   Pneumococcal Conjugate-13 08/24/2013   Pneumococcal Polysaccharide-23 06/17/1996   Td 02/17/2017   Tdap 02/17/2017   Zoster, Live 03/17/2014    ROS: No chest pain, shortness of breath.  She reports chronic polyarthralgia but admits that this is because she is "just getting older and is 87 years old   ROS: Per HPI  Allergies  Allergen Reactions   Codeine Nausea And Vomiting   Doxycycline Hives   Other Nausea Only    Almost all antibiotics   Sulfonamide Derivatives Hives   Vancomycin Itching   Past Medical History:  Diagnosis Date   Abnormality of gait 01/29/2016   Aortic root dilatation (HCC)    Mild   Cataract    Diabetes mellitus without complication (HCC)    type 2   Dyslipidemia    Dyspnea    with exertion   Dysrhythmia    hx of occ palpitations   Esophageal stricture    s/p dilation   GERD (gastroesophageal reflux disease)    Headache    History of hiatal hernia    Hyperlipidemia    Hypertension    Macular pucker, left eye     Osteoarthritis    Osteoporosis    Tinnitus    left ear last week or so    Current Outpatient Medications:    aspirin EC 81 MG tablet, Take 81 mg by mouth daily., Disp: , Rfl:    calcium carbonate (OS-CAL) 600 MG TABS tablet, Take 600 mg by mouth 2 (two) times daily with a meal., Disp: , Rfl:    cephALEXin (KEFLEX) 500 MG capsule, Take 1 capsule (500 mg total) by mouth 2 (two) times daily., Disp: 14 capsule, Rfl: 0   Cholecalciferol (VITAMIN D3) 10 MCG (400 UNIT) CAPS, Take by mouth., Disp: , Rfl:    dapagliflozin propanediol (FARXIGA) 5 MG TABS tablet, Take 1 tablet (5 mg total) by mouth daily before breakfast., Disp: 90 tablet, Rfl: 1   diltiazem (CARDIZEM CD) 180 MG 24 hr capsule, Take 1 capsule (180 mg total) by mouth daily., Disp: 90 capsule, Rfl: 3   estradiol (ESTRACE) 0.1 MG/GM vaginal cream, APPLY A PEA SIZE AMOUNT PER VAGINA 2-3 NIGHTS A WEEK -FOR VAGINAL USE-, Disp: 42.5 g, Rfl: 6   furosemide (LASIX) 20 MG tablet, TAKE 1 TABLET DAILY, Disp: 30 tablet, Rfl: 0   hydroxypropyl methylcellulose / hypromellose (ISOPTO TEARS / GONIOVISC) 2.5 % ophthalmic solution, Place 1 drop into both eyes 3 (three) times daily as needed for dry eyes., Disp: , Rfl:    Lancets (ONETOUCH ULTRASOFT) lancets, CHECK BLOOD SUGAR ONCE A DAY, Disp: 50 each, Rfl: 9  lisinopril (ZESTRIL) 40 MG tablet, TAKE 1 TABLET DAILY, Disp: 90 tablet, Rfl: 1   metFORMIN (GLUCOPHAGE) 1000 MG tablet, Take 1 tablet (1,000 mg total) by mouth daily. (with your largest meal of the day), Disp: 90 tablet, Rfl: 3   multivitamin-lutein (OCUVITE-LUTEIN) CAPS capsule, Take 1 capsule by mouth daily., Disp: , Rfl:    MYRBETRIQ 25 MG TB24 tablet, Take 25 mg by mouth daily., Disp: , Rfl:    nitroGLYCERIN (NITROSTAT) 0.4 MG SL tablet, Place 1 tablet (0.4 mg total) under the tongue every 5 (five) minutes as needed for chest pain., Disp: 25 tablet, Rfl: 3   ONETOUCH ULTRA test strip, Check BS daily Dx E11.69, Disp: 100 strip, Rfl: 3    pantoprazole (PROTONIX) 40 MG tablet, Take 40 mg by mouth daily., Disp: , Rfl:    pravastatin (PRAVACHOL) 40 MG tablet, Take 1 tablet (40 mg total) by mouth daily., Disp: 90 tablet, Rfl: 3   predniSONE (DELTASONE) 20 MG tablet, Take 1 tablet (20 mg total) by mouth daily with breakfast., Disp: 10 tablet, Rfl: 0   sodium chloride (OCEAN) 0.65 % SOLN nasal spray, Place 1 spray into both nostrils as needed for congestion., Disp: 30 mL, Rfl: 0 Social History   Socioeconomic History   Marital status: Widowed    Spouse name: Not on file   Number of children: 5   Years of education: 5   Highest education level: Not on file  Occupational History   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    Comment: Retired  Tobacco Use   Smoking status: Never   Smokeless tobacco: Never  Scientific laboratory technician Use: Never used  Substance and Sexual Activity   Alcohol use: No   Drug use: No   Sexual activity: Not Currently  Other Topics Concern   Not on file  Social History Narrative   Pt lives at home alone   Husband w/ Alzheimer's passed away   Right-handed   Drinks 1-2 cups of coffee daily   Social Determinants of Health   Financial Resource Strain: Not on file  Food Insecurity: Not on file  Transportation Needs: Not on file  Physical Activity: Not on file  Stress: Not on file  Social Connections: Not on file  Intimate Partner Violence: Not on file   Family History  Problem Relation Age of Onset   Pneumonia Mother    Brain cancer Father    Tremor Father    Arthritis Sister    COPD Sister    Emphysema Brother    COPD Brother    Tremor Brother    Lung cancer Brother    Heart disease Brother    Liver cancer Brother    Diabetes Other         1 child has diabetes at age 78 and the other 4 are healthy    Objective: Office vital signs reviewed. BP 122/79   Pulse 81   Temp (!) 97.5 F (36.4 C) (Temporal)   Ht '5\' 4"'  (1.626 m)   Wt 151 lb 12.8 oz (68.9 kg)   SpO2 98%   BMI  26.06 kg/m   Physical Examination:  General: Awake, alert, well-appearing elderly female, No acute distress HEENT: Normal; sclera white. Cardio: regular rate and rhythm, S1S2 heard, no murmurs appreciated Pulm: clear to auscultation bilaterally, no wheezes, rhonchi or rales; normal work of breathing on room air MSK: Slow gait.  Hunched station.  Assessment/ Plan: 87 y.o. female  Type 2 diabetes mellitus with hyperlipidemia ([place]) - Plan: CMP14+EGFR, Bayer DCA Hb A1c Waived  Hypertension associated with diabetes ([place])  Need for immunization against influenza - Plan: Flu Vaccine QUAD High Dose(Fluad)  Her A1c has come down to 7.7.  I think this is an appropriate goal for this 87 year old female.  I am not sure if she is actually taking the [place] but I did asked that she check this and get back to me.  She is overdue for diabetic eye and foot exam.  Blood pressure is at goal for age.  No changes needed.  Influenza vaccination administered  Orders Placed This Encounter  Procedures   Flu Vaccine QUAD High Dose(Fluad)   CMP14+EGFR   Bayer DCA Hb A1c Waived   No orders of the defined types were placed in this encounter.    [name], DO [place] [phone]

## 2021-05-27 ENCOUNTER — Telehealth: Payer: Self-pay | Admitting: Family Medicine

## 2021-05-27 NOTE — Telephone Encounter (Signed)
Lm for donna to call back

## 2021-05-31 NOTE — Telephone Encounter (Signed)
Per lab result notes, patient is aware of results

## 2021-06-19 ENCOUNTER — Ambulatory Visit (INDEPENDENT_AMBULATORY_CARE_PROVIDER_SITE_OTHER): Payer: Medicare Other | Admitting: *Deleted

## 2021-06-19 DIAGNOSIS — R296 Repeated falls: Secondary | ICD-10-CM

## 2021-06-19 DIAGNOSIS — E1169 Type 2 diabetes mellitus with other specified complication: Secondary | ICD-10-CM

## 2021-06-19 DIAGNOSIS — M1990 Unspecified osteoarthritis, unspecified site: Secondary | ICD-10-CM

## 2021-06-19 DIAGNOSIS — E785 Hyperlipidemia, unspecified: Secondary | ICD-10-CM

## 2021-06-21 ENCOUNTER — Other Ambulatory Visit: Payer: Self-pay | Admitting: Nurse Practitioner

## 2021-06-21 DIAGNOSIS — L03116 Cellulitis of left lower limb: Secondary | ICD-10-CM

## 2021-06-24 DIAGNOSIS — M1990 Unspecified osteoarthritis, unspecified site: Secondary | ICD-10-CM | POA: Diagnosis not present

## 2021-06-24 DIAGNOSIS — E1169 Type 2 diabetes mellitus with other specified complication: Secondary | ICD-10-CM | POA: Diagnosis not present

## 2021-06-24 DIAGNOSIS — E785 Hyperlipidemia, unspecified: Secondary | ICD-10-CM | POA: Diagnosis not present

## 2021-06-24 NOTE — Patient Instructions (Signed)
Visit Information  Patient Goals/Self-Care Activities: Patient will self administer medications as prescribed as evidenced by self report/primary caregiver report  Patient will attend all scheduled provider appointments as evidenced by clinician review of documented attendance to scheduled appointments and patient/caregiver report Patient will attend church or other social activities as evidenced by patient report Patient will continue to perform ADL's independently as evidenced by patient/caregiver report Patient will call provider office for new concerns or questions as evidenced by review of documented incoming telephone call notes and patient report - check feet daily for cuts, sores or redness - enter blood sugar readings and medication or insulin into daily log - take the blood sugar log to all doctor visits - take the blood sugar meter to all doctor visits - fill half of plate with vegetables - read food labels for fat, fiber, carbohydrates and portion size - wash and dry feet carefully every day - wear comfortable, cotton socks - wear comfortable, well-fitting shoes  The patient verbalized understanding of instructions, educational materials, and care plan provided today and declined offer to receive copy of patient instructions, educational materials, and care plan.    Plan:Telephone follow up appointment with care management team member scheduled for:  07/11/2021 with RNCM The patient has been provided with contact information for the care management team and has been advised to call with any health related questions or concerns.   Chong Sicilian, BSN, RN-BC Embedded Chronic Care Manager [facility] / [place] Management Direct Dial: [phone]

## 2021-06-24 NOTE — Chronic Care Management (AMB) (Signed)
Chronic Care Management   CCM RN Visit Note  06/19/2021 Name: Debra Phillips MRN: [id number] DOB: 08-18-1929  Subjective: Debra Phillips is a 87 y.o. year old female who is a primary care patient of [name], DO. The care management team was consulted for assistance with disease management and care coordination needs.    Engaged with patient by telephone for follow up visit in response to provider referral for case management and/or care coordination services.   Consent to Services:  The patient was given information about Chronic Care Management services, agreed to services, and gave verbal consent prior to initiation of services.  Please see initial visit note for detailed documentation.   Patient agreed to services and verbal consent obtained.   Assessment: Review of patient past medical history, allergies, medications, health status, including review of consultants reports, laboratory and other test data, was performed as part of comprehensive evaluation and provision of chronic care management services.   SDOH (Social Determinants of Health) assessments and interventions performed:    CCM Care Plan  Allergies  Allergen Reactions   Codeine Nausea And Vomiting   Doxycycline Hives   Other Nausea Only    Almost all antibiotics   Sulfonamide Derivatives Hives   Vancomycin Itching    Outpatient Encounter Medications as of 06/19/2021  Medication Sig Note   aspirin EC 81 MG tablet Take 81 mg by mouth daily.    calcium carbonate (OS-CAL) 600 MG TABS tablet Take 600 mg by mouth 2 (two) times daily with a meal.    Cholecalciferol (VITAMIN D3) 10 MCG (400 UNIT) CAPS Take by mouth.    diltiazem (CARDIZEM CD) 180 MG 24 hr capsule Take 1 capsule (180 mg total) by mouth daily.    estradiol (ESTRACE) 0.1 MG/GM vaginal cream APPLY A PEA SIZE AMOUNT PER VAGINA 2-3 NIGHTS A WEEK -FOR VAGINAL USE-    hydroxypropyl methylcellulose / hypromellose (ISOPTO TEARS / GONIOVISC) 2.5  % ophthalmic solution Place 1 drop into both eyes 3 (three) times daily as needed for dry eyes.    Lancets (ONETOUCH ULTRASOFT) lancets CHECK BLOOD SUGAR ONCE A DAY    lisinopril (ZESTRIL) 40 MG tablet TAKE 1 TABLET DAILY    metFORMIN (GLUCOPHAGE) 1000 MG tablet Take 1 tablet (1,000 mg total) by mouth daily. (with your largest meal of the day) (Patient taking differently: Take 1,000 mg by mouth 2 (two) times daily with a meal.)    multivitamin-lutein (OCUVITE-LUTEIN) CAPS capsule Take 1 capsule by mouth daily.    MYRBETRIQ 25 MG TB24 tablet Take 25 mg by mouth daily.    nitroGLYCERIN (NITROSTAT) 0.4 MG SL tablet Place 1 tablet (0.4 mg total) under the tongue every 5 (five) minutes as needed for chest pain.    pantoprazole (PROTONIX) 40 MG tablet Take 40 mg by mouth daily.    pravastatin (PRAVACHOL) 40 MG tablet Take 1 tablet (40 mg total) by mouth daily.    sodium chloride (OCEAN) 0.65 % SOLN nasal spray Place 1 spray into both nostrils as needed for congestion.    [DISCONTINUED] furosemide (LASIX) 20 MG tablet TAKE 1 TABLET DAILY    cephALEXin (KEFLEX) 500 MG capsule Take 1 capsule (500 mg total) by mouth 2 (two) times daily. (Patient not taking: Reported on 06/19/2021)    dapagliflozin propanediol (FARXIGA) 5 MG TABS tablet Take 1 tablet (5 mg total) by mouth daily before breakfast. 06/19/2021: Patient is unsure if she's taking this   ONETOUCH ULTRA test strip Check BS daily  Dx E11.69    predniSONE (DELTASONE) 20 MG tablet Take 1 tablet (20 mg total) by mouth daily with breakfast. (Patient not taking: Reported on 06/19/2021)    No facility-administered encounter medications on file as of 06/19/2021.    Patient Active Problem List   Diagnosis Date Noted   Vaginal yeast infection 04/22/2021   Sinus congestion 10/16/2020   Hyponatremia 12/22/2017   Dysuria 01/22/2017   Pelvic prolapse 08/28/2016   Tremor 01/29/2016   Abnormality of gait 01/29/2016   Type 2 diabetes mellitus with  hyperlipidemia ([place]) 01/14/2016   Age-related macular degeneration 07/13/2015   Keratoconjunctivitis sicca of both eyes ([place]) 07/13/2015   Meibomian gland dysfunction (MGD), bilateral, both upper and lower lids 07/13/2015   Pseudophakia of both eyes 07/13/2015   Osteoarthritis of lumbar spine 05/08/2015   Spondylosis of lumbar region without myelopathy or radiculopathy 09/01/2014   Diabetes type 2, controlled ([place]) 04/26/2014   Vitamin D deficiency 04/26/2014   Chronic cystitis 04/26/2014   Atrophic vaginitis 04/26/2014   Solitary pulmonary nodule 03/20/2014   Chest pain 03/18/2014   Osteoporotic compression fracture of spine with routine healing 03/08/2014   PALPITATIONS 02/27/2010   Coronary atherosclerosis 12/12/2009   Aortic aneurysm of unspecified site without mention of rupture 06/27/2009   DYSPNEA 06/27/2009   Hyperlipidemia associated with type 2 diabetes mellitus ([place]) 12/12/2008   Essential hypertension 12/12/2008   GERD 12/12/2008   Osteoarthritis 12/12/2008    Conditions to be addressed/monitored:DMII, Osteoarthritis, and Fall Risk  Care Plan : [facility] Care Plan     Problem: Chronic Disease Management Needs   Priority: High  Onset Date: 06/19/2021     Long-Range Goal: Work with RN Care Manager Regarding Care Management and [place] with Diabetes and Osteoarthritis   Start Date: 06/19/2021  Expected End Date: 06/19/2022  This Visit's Progress: On track  Priority: High  Note:   Current Barriers:  Chronic Disease Management support and education needs related to DMII and osteoarthritis Transportation barriers  RNCM Clinical Goal(s):  Patient will continue to work with Consulting civil engineer and/or Social Worker to address care management and care coordination needs related to DMII and Osteoarthritis as evidenced by adherence to CM Team Scheduled appointments     through collaboration with Consulting civil engineer, provider, and care team.   Interventions: 1:1  collaboration with primary care provider regarding development and update of comprehensive plan of care as evidenced by provider attestation and co-signature Inter-disciplinary care team collaboration (see longitudinal plan of care) Evaluation of current treatment plan related to  self management and patient's adherence to plan as established by provider Discussed family/social support Lives alone but has help from daughters as needed Assessed for SDOH needs/barriers Provided with RN Care Manager contact number and encouraged to reach out as needed   Diabetes:  (Status: Goal on Track (progressing): YES.) Long Term Goal   Lab Results  Component Value Date   HGBA1C 7.7 (H) 05/24/2021  Assessed patient's understanding of A1c goal: <8% Provided education to patient about basic DM disease process; Counseled on importance of regular laboratory monitoring as prescribed;        Discussed plans with patient for ongoing care management follow up and provided patient with direct contact information for care management team;      Reviewed scheduled/upcoming provider appointments including: 09/23/2020 with Dr Lajuana Ripple;         Advised patient, providing education and rationale, to check cbg daily and record  Review of patient status, including review of consultants reports, relevant laboratory and other test results, and medications completed;       Assessed social determinant of health barriers;        Discussed home blood sugar testing Not testing daily Ranges from 100-130 when she does check it Previously discussed diet and activity level Activity limited by chronic back pain and arthritis   Falls:  (Status: Goal on Track (progressing): YES.) Long Term Goal  Provided written and verbal education re: potential causes of falls and Fall prevention strategies Reviewed medications and discussed potential side effects of medications such as dizziness and frequent urination Advised patient of  importance of notifying provider of falls Assessed for signs and symptoms of orthostatic hypotension Assessed for falls since last encounter Provided patient information for fall alert systems Assessed working status of life alert bracelet and patient adherence Assessed social determinant of health barriers Encouraged patient to move carefully and change positions slowly in order to decrease risk for falls Assessed use of assistive devices Using cane and encouraged to continue to do so  Pain:  (Status: Goal on track: NO.) Long Term Goal  Pain assessment performed Medications reviewed Reviewed provider established plan for pain management; Discussed importance of adherence to all scheduled medical appointments; Counseled on the importance of reporting any/all new or changed pain symptoms or management strategies to pain management provider; Advised patient to report to care team affect of pain on daily activities; Discussed use of relaxation techniques and/or diversional activities to assist with pain reduction (distraction, imagery, relaxation, massage, acupressure, TENS, heat, and cold application; Reviewed with patient prescribed pharmacological and nonpharmacological pain relief strategies; Assessed social determinant of health barriers;   Patient Goals/Self-Care Activities: Patient will self administer medications as prescribed as evidenced by self report/primary caregiver report  Patient will attend all scheduled provider appointments as evidenced by clinician review of documented attendance to scheduled appointments and patient/caregiver report Patient will attend church or other social activities as evidenced by patient report Patient will continue to perform ADL's independently as evidenced by patient/caregiver report Patient will call provider office for new concerns or questions as evidenced by review of documented incoming telephone call notes and patient report - check feet  daily for cuts, sores or redness - enter blood sugar readings and medication or insulin into daily log - take the blood sugar log to all doctor visits - take the blood sugar meter to all doctor visits - fill half of plate with vegetables - read food labels for fat, fiber, carbohydrates and portion size - wash and dry feet carefully every day - wear comfortable, cotton socks - wear comfortable, well-fitting shoes   Plan:Telephone follow up appointment with care management team member scheduled for:  07/11/2021 with RNCM The patient has been provided with contact information for the care management team and has been advised to call with any health related questions or concerns.   Chong Sicilian, BSN, RN-BC Embedded Chronic Care Manager [facility] / [place] [facility] Direct Dial: [phone]

## 2021-07-02 ENCOUNTER — Encounter: Payer: Self-pay | Admitting: Family Medicine

## 2021-07-02 ENCOUNTER — Other Ambulatory Visit: Payer: Self-pay | Admitting: Family Medicine

## 2021-07-02 ENCOUNTER — Other Ambulatory Visit: Payer: Self-pay

## 2021-07-02 ENCOUNTER — Ambulatory Visit (INDEPENDENT_AMBULATORY_CARE_PROVIDER_SITE_OTHER): Payer: Medicare Other | Admitting: Family Medicine

## 2021-07-02 VITALS — BP 150/87 | HR 77 | Temp 97.9°F | Ht 64.0 in | Wt 151.0 lb

## 2021-07-02 DIAGNOSIS — N952 Postmenopausal atrophic vaginitis: Secondary | ICD-10-CM | POA: Diagnosis not present

## 2021-07-02 DIAGNOSIS — R32 Unspecified urinary incontinence: Secondary | ICD-10-CM | POA: Diagnosis not present

## 2021-07-02 DIAGNOSIS — R300 Dysuria: Secondary | ICD-10-CM

## 2021-07-02 DIAGNOSIS — L258 Unspecified contact dermatitis due to other agents: Secondary | ICD-10-CM

## 2021-07-02 DIAGNOSIS — N3000 Acute cystitis without hematuria: Secondary | ICD-10-CM

## 2021-07-02 LAB — URINALYSIS, ROUTINE W REFLEX MICROSCOPIC
Bilirubin, UA: NEGATIVE
Glucose, UA: NEGATIVE
Ketones, UA: NEGATIVE
Nitrite, UA: NEGATIVE
Protein,UA: NEGATIVE
RBC, UA: NEGATIVE
Specific Gravity, UA: <1.005 — ABNORMAL LOW (ref 1.005–1.030)
Urobilinogen, Ur: 0.2 mg/dL (ref 0.2–1.0)
pH, UA: 5.5 (ref 5.0–7.5)

## 2021-07-02 LAB — MICROSCOPIC EXAMINATION: Renal Epithel, UA: NONE SEEN /hpf

## 2021-07-02 MED ORDER — REPLENS VA GEL: 1.0000 "application " | Freq: Every day | VAGINAL | 3 refills | Status: DC

## 2021-07-02 NOTE — Patient Instructions (Addendum)
A&D ointment twice daily for irritation for 2 weeks. Replens daily as prescribed. Change incontinence pads frequently. Continue Estrogen cream as prescribed.

## 2021-07-02 NOTE — Progress Notes (Signed)
Subjective:  Patient ID: Debra Phillips, female    DOB: 1928/11/09, 87 y.o.   MRN: [id number]  Patient Care Team: [name], DO as PCP - General (Family Medicine) Minus Breeding, MD as PCP - Cardiology (Cardiology) Irine Seal, MD as Consulting Physician (Urology) Minus Breeding, MD as Consulting Physician (Cardiology) Glenna Fellows, MD as Attending Physician (Neurosurgery) Dorene Ar, MD as Consulting Physician (Pain Medicine) Bunnie Pion, MD as Consulting Physician (Ophthalmology) Gala Romney Cristopher Estimable, MD as Consulting Physician (Gastroenterology) Ilean China, RN as Case Manager   Chief Complaint:  Dysuria (x2 weeks, recently feeling a little raw)   HPI: Debra Phillips is a 87 y.o. female presenting on 07/02/2021 for Dysuria (x2 weeks, recently feeling a little raw)   Dysuria  Associated symptoms include urgency. Pertinent negatives include no chills, flank pain, frequency or hematuria.    Relevant past medical, surgical, family, and social history reviewed and updated as indicated.  Allergies and medications reviewed and updated. Data reviewed: Chart in Epic.   Past Medical History:  Diagnosis Date   Abnormality of gait 01/29/2016   Aortic root dilatation (HCC)    Mild   Cataract    Diabetes mellitus without complication (HCC)    type 2   Dyslipidemia    Dyspnea    with exertion   Dysrhythmia    hx of occ palpitations   Esophageal stricture    s/p dilation   GERD (gastroesophageal reflux disease)    Headache    History of hiatal hernia    Hyperlipidemia    Hypertension    Macular pucker, left eye    Osteoarthritis    Osteoporosis    Tinnitus    left ear last week or so    Past Surgical History:  Procedure Laterality Date   ABDOMINAL HYSTERECTOMY     partial   APPENDECTOMY     BACK SURGERY     lower x 1   CARDIAC CATHETERIZATION  2010   both cataracts   CHOLECYSTECTOMY  1993   CYSTOSCOPY N/A 08/28/2016   Procedure:  CYSTOSCOPY;  Surgeon: Carolan Clines, MD;  Location: [facility];  Service: Urology;  Laterality: N/A;   EYE SURGERY     cataracts   EYE SURGERY     for macular pucker   FRACTURE SURGERY     rt wrist2010   HARDWARE REMOVAL  12/10/2011   Procedure: HARDWARE REMOVAL;  Surgeon: Schuyler Amor, MD;  Location: [facility];  Service: Orthopedics;  Laterality: Right;  radial head   KNEE ARTHROSCOPY  2004   Left and right   KYPHOSIS SURGERY     LUMBAR FUSION  2000,2003   RADIAL HEAD ARTHROPLASTY  5/12   orif rt radial head   SHOULDER SURGERY     Right rotator cuff epair   TOTAL KNEE ARTHROPLASTY     Left   VAGINAL PROLAPSE REPAIR N/A 08/28/2016   Procedure: ANTERIOR VAGINAL VAULT SUSPENSION WITH SACROSPINOUS FIXATION;  Surgeon: Carolan Clines, MD;  Location: [facility];  Service: Urology;  Laterality: N/A;    Social History   Socioeconomic History   Marital status: Widowed    Spouse name: Not on file   Number of children: 5   Years of education: 5   Highest education level: Not on file  Occupational History   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    Comment: Retired  Tobacco Use   Smoking status: Never  Smokeless tobacco: Never  Vaping Use   Vaping Use: Never used  Substance and Sexual Activity   Alcohol use: No   Drug use: No   Sexual activity: Not Currently  Other Topics Concern   Not on file  Social History Narrative   Pt lives at home alone   Husband w/ Alzheimer's passed away   Right-handed   Drinks 1-2 cups of coffee daily   Social Determinants of Health   Financial Resource Strain: Not on file  Food Insecurity: Not on file  Transportation Needs: Not on file  Physical Activity: Not on file  Stress: Not on file  Social Connections: Not on file  Intimate Partner Violence: Not on file    Outpatient Encounter Medications as of 07/02/2021  Medication Sig   aspirin EC 81 MG tablet Take 81 mg by mouth daily.   calcium carbonate  (OS-CAL) 600 MG TABS tablet Take 600 mg by mouth 2 (two) times daily with a meal.   Cholecalciferol (VITAMIN D3) 10 MCG (400 UNIT) CAPS Take by mouth.   dapagliflozin propanediol (FARXIGA) 5 MG TABS tablet Take 1 tablet (5 mg total) by mouth daily before breakfast.   diltiazem (CARDIZEM CD) 180 MG 24 hr capsule Take 1 capsule (180 mg total) by mouth daily.   estradiol (ESTRACE) 0.1 MG/GM vaginal cream APPLY A PEA SIZE AMOUNT PER VAGINA 2-3 NIGHTS A WEEK -FOR VAGINAL USE-   furosemide (LASIX) 20 MG tablet TAKE 1 TABLET DAILY   hydroxypropyl methylcellulose / hypromellose (ISOPTO TEARS / GONIOVISC) 2.5 % ophthalmic solution Place 1 drop into both eyes 3 (three) times daily as needed for dry eyes.   Lancets (ONETOUCH ULTRASOFT) lancets CHECK BLOOD SUGAR ONCE A DAY   lisinopril (ZESTRIL) 40 MG tablet TAKE 1 TABLET DAILY   metFORMIN (GLUCOPHAGE) 1000 MG tablet Take 1 tablet (1,000 mg total) by mouth daily. (with your largest meal of the day) (Patient taking differently: Take 1,000 mg by mouth 2 (two) times daily with a meal.)   multivitamin-lutein (OCUVITE-LUTEIN) CAPS capsule Take 1 capsule by mouth daily.   MYRBETRIQ 25 MG TB24 tablet Take 25 mg by mouth daily.   nitroGLYCERIN (NITROSTAT) 0.4 MG SL tablet Place 1 tablet (0.4 mg total) under the tongue every 5 (five) minutes as needed for chest pain.   ONETOUCH ULTRA test strip Check BS daily Dx E11.69   pantoprazole (PROTONIX) 40 MG tablet Take 40 mg by mouth daily.   pravastatin (PRAVACHOL) 40 MG tablet Take 1 tablet (40 mg total) by mouth daily.   predniSONE (DELTASONE) 20 MG tablet Take 1 tablet (20 mg total) by mouth daily with breakfast.   sodium chloride (OCEAN) 0.65 % SOLN nasal spray Place 1 spray into both nostrils as needed for congestion.   Vaginal Lubricant (REPLENS) GEL Place 1 application vaginally daily.   cephALEXin (KEFLEX) 500 MG capsule Take 1 capsule (500 mg total) by mouth 2 (two) times daily. (Patient not taking: Reported on  07/02/2021)   No facility-administered encounter medications on file as of 07/02/2021.    Allergies  Allergen Reactions   Codeine Nausea And Vomiting   Doxycycline Hives   Other Nausea Only    Almost all antibiotics   Sulfonamide Derivatives Hives   Vancomycin Itching    Review of Systems  Constitutional:  Negative for activity change, appetite change, chills, diaphoresis, fatigue, fever and unexpected weight change.  Genitourinary:  Positive for dysuria and urgency. Negative for decreased urine volume, difficulty urinating, enuresis, flank pain, frequency, genital  sores, hematuria, pelvic pain, vaginal bleeding, vaginal discharge and vaginal pain.       Vaginal irritation  Neurological:  Negative for weakness.  Psychiatric/Behavioral:  Negative for confusion.   All other systems reviewed and are negative.      Objective:  BP (!) 150/87   Pulse 77   Temp 97.9 F (36.6 C)   Ht '5\' 4"'  (1.626 m)   Wt 151 lb (68.5 kg)   SpO2 98%   BMI 25.92 kg/m    Wt Readings from Last 3 Encounters:  07/02/21 151 lb (68.5 kg)  05/24/21 151 lb 12.8 oz (68.9 kg)  05/20/21 151 lb 12.8 oz (68.9 kg)    Physical Exam Constitutional:      Appearance: Normal appearance. She is normal weight.  HENT:     Head: Normocephalic and atraumatic.  Eyes:     Pupils: Pupils are equal, round, and reactive to light.  Cardiovascular:     Rate and Rhythm: Normal rate and regular rhythm.  Pulmonary:     Effort: Pulmonary effort is normal.     Breath sounds: Normal breath sounds.  Abdominal:     General: Abdomen is protuberant. Bowel sounds are normal.     Palpations: Abdomen is soft.     Tenderness: There is no abdominal tenderness. There is no right CVA tenderness or left CVA tenderness.     Hernia: There is no hernia in the left inguinal area or right inguinal area.  Genitourinary:    Exam position: Lithotomy position.     Pubic Area: No rash or pubic lice.      Tanner stage (genital): 5.      Labia:        Right: Rash present.        Left: Rash present.      Comments: Erythema and excoriation to bilateral labia.  Lymphadenopathy:     Lower Body: No right inguinal adenopathy. No left inguinal adenopathy.  Skin:    General: Skin is warm and dry.     Capillary Refill: Capillary refill takes less than 2 seconds.  Neurological:     General: No focal deficit present.     Mental Status: She is alert. Mental status is at baseline.  Psychiatric:        Mood and Affect: Mood normal.        Behavior: Behavior normal.        Thought Content: Thought content normal.        Judgment: Judgment normal.    Results for orders placed or performed in visit on 05/24/21  CMP14+EGFR  Result Value Ref Range   Glucose 224 (H) 70 - 99 mg/dL   BUN 14 10 - 36 mg/dL   Creatinine, Ser 0.93 0.57 - 1.00 mg/dL   eGFR 58 (L) >59 mL/min/1.73   BUN/Creatinine Ratio 15 12 - 28   Sodium 138 134 - 144 mmol/L   Potassium 4.4 3.5 - 5.2 mmol/L   Chloride 98 96 - 106 mmol/L   CO2 25 20 - 29 mmol/L   Calcium 9.7 8.7 - 10.3 mg/dL   Total Protein 6.2 6.0 - 8.5 g/dL   Albumin 4.3 3.5 - 4.6 g/dL   Globulin, Total 1.9 1.5 - 4.5 g/dL   Albumin/Globulin Ratio 2.3 (H) 1.2 - 2.2   Bilirubin Total 0.5 0.0 - 1.2 mg/dL   Alkaline Phosphatase 69 44 - 121 IU/L   AST 16 0 - 40 IU/L   ALT 12 0 - 32 IU/L  Bayer DCA Hb A1c Waived  Result Value Ref Range   HB A1C (BAYER DCA - WAIVED) 7.7 (H) 4.8 - 5.6 %     Urinalysis in office revealed 1+ leukocytes, otherwise unremarkable. Culture pending.   Pertinent labs & imaging results that were available during my care of the patient were reviewed by me and considered in my medical decision making.  Assessment & Plan:  Cia was seen today for dysuria.  Diagnoses and all orders for this visit:  Dysuria Urinalysis unremarkable. Culture pending and will treat if warranted.  -     Urinalysis, Routine w reflex microscopic -     Urine Culture  Atrophic  vaginitis Incontinence dermatitis Pt instructed to change incontinence pad several times per day. A&D ointment twice daily for the next 2 weeks and then start Replens daily as prescribed.  -     Vaginal Lubricant (REPLENS) GEL; Place 1 application vaginally daily.     Continue all other maintenance medications.  Follow up plan: Return in about 4 weeks (around 07/30/2021), or if symptoms worsen or fail to improve.   Continue healthy lifestyle choices, including diet (rich in fruits, vegetables, and lean proteins, and low in salt and simple carbohydrates) and exercise (at least 30 minutes of moderate physical activity daily).  Educational handout given for atrophic vaginitis  The above assessment and management plan was discussed with the patient. The patient verbalized understanding of and has agreed to the management plan. Patient is aware to call the clinic if they develop any new symptoms or if symptoms persist or worsen. Patient is aware when to return to the clinic for a follow-up visit. Patient educated on when it is appropriate to go to the emergency department.   Monia Pouch, FNP-C [facility] [phone]

## 2021-07-10 LAB — URINE CULTURE

## 2021-07-10 MED ORDER — AMOXICILLIN-POT CLAVULANATE 875-125 MG PO TABS: 1.0000 | ORAL_TABLET | Freq: Two times a day (BID) | ORAL | 0 refills | Status: AC

## 2021-07-10 NOTE — Addendum Note (Signed)
Addended by: Baruch Gouty on: 07/10/2021 04:54 PM   Modules accepted: Orders

## 2021-07-11 ENCOUNTER — Ambulatory Visit (INDEPENDENT_AMBULATORY_CARE_PROVIDER_SITE_OTHER): Payer: Medicare Other | Admitting: *Deleted

## 2021-07-11 DIAGNOSIS — N952 Postmenopausal atrophic vaginitis: Secondary | ICD-10-CM

## 2021-07-11 DIAGNOSIS — E785 Hyperlipidemia, unspecified: Secondary | ICD-10-CM

## 2021-07-11 DIAGNOSIS — M1990 Unspecified osteoarthritis, unspecified site: Secondary | ICD-10-CM

## 2021-07-11 NOTE — Patient Instructions (Signed)
Visit Information   Patient Goals/Self-Care Activities: Patient will self administer medications as prescribed as evidenced by self report/primary caregiver report  Patient will attend all scheduled provider appointments as evidenced by clinician review of documented attendance to scheduled appointments and patient/caregiver report Patient will attend church or other social activities as evidenced by patient report Patient will continue to perform ADL's independently as evidenced by patient/caregiver report Patient will call provider office for new concerns or questions as evidenced by review of documented incoming telephone call notes and patient report - check feet daily for cuts, sores or redness - enter blood sugar readings and medication or insulin into daily log - take the blood sugar log to all doctor visits - take the blood sugar meter to all doctor visits - fill half of plate with vegetables - read food labels for fat, fiber, carbohydrates and portion size - wash and dry feet carefully every day - wear comfortable, cotton socks - wear comfortable, well-fitting shoes Use vaginal gel Change pads if wet Go without pads as often as possible Call PCP with any new, worsening, or persistent labial symptoms  The patient verbalized understanding of instructions, educational materials, and care plan provided today and declined offer to receive copy of patient instructions, educational materials, and care plan.   Telephone follow up appointment with care management team member scheduled for:08/06/21 with RNCM  Chong Sicilian, BSN, RN-BC [place] [facility] Direct Dial: [phone]

## 2021-07-11 NOTE — Chronic Care Management (AMB) (Signed)
Chronic Care Management   CCM RN Visit Note  07/11/2021 Name: Debra Phillips MRN: [id number] DOB: 02/21/1929  Subjective: Debra Phillips is a 87 y.o. year old female who is a primary care patient of [name], DO. The care management team was consulted for assistance with disease management and care coordination needs.    Engaged with patient by telephone for follow up visit in response to provider referral for case management and/or care coordination services.   Consent to Services:  The patient was given information about Chronic Care Management services, agreed to services, and gave verbal consent prior to initiation of services.  Please see initial visit note for detailed documentation.   Patient agreed to services and verbal consent obtained.   Assessment: Review of patient past medical history, allergies, medications, health status, including review of consultants reports, laboratory and other test data, was performed as part of comprehensive evaluation and provision of chronic care management services.   SDOH (Social Determinants of Health) assessments and interventions performed:    CCM Care Plan  Allergies  Allergen Reactions   Codeine Nausea And Vomiting   Doxycycline Hives   Other Nausea Only    Almost all antibiotics   Sulfonamide Derivatives Hives   Vancomycin Itching    Outpatient Encounter Medications as of 07/11/2021  Medication Sig Note   amoxicillin-clavulanate (AUGMENTIN) 875-125 MG tablet Take 1 tablet by mouth 2 (two) times daily for 7 days.    aspirin EC 81 MG tablet Take 81 mg by mouth daily.    calcium carbonate (OS-CAL) 600 MG TABS tablet Take 600 mg by mouth 2 (two) times daily with a meal.    Cholecalciferol (VITAMIN D3) 10 MCG (400 UNIT) CAPS Take by mouth.    dapagliflozin propanediol (FARXIGA) 5 MG TABS tablet Take 1 tablet (5 mg total) by mouth daily before breakfast. 06/19/2021: Patient is unsure if she's taking this   diltiazem  (CARDIZEM CD) 180 MG 24 hr capsule Take 1 capsule (180 mg total) by mouth daily.    estradiol (ESTRACE) 0.1 MG/GM vaginal cream APPLY A PEA SIZE AMOUNT PER VAGINA 2-3 NIGHTS A WEEK -FOR VAGINAL USE-    furosemide (LASIX) 20 MG tablet TAKE 1 TABLET DAILY    hydroxypropyl methylcellulose / hypromellose (ISOPTO TEARS / GONIOVISC) 2.5 % ophthalmic solution Place 1 drop into both eyes 3 (three) times daily as needed for dry eyes.    Lancets (ONETOUCH ULTRASOFT) lancets CHECK BLOOD SUGAR ONCE A DAY    lisinopril (ZESTRIL) 40 MG tablet TAKE 1 TABLET DAILY    metFORMIN (GLUCOPHAGE) 1000 MG tablet Take 1 tablet (1,000 mg total) by mouth daily. (with your largest meal of the day) (Patient taking differently: Take 1,000 mg by mouth 2 (two) times daily with a meal.)    multivitamin-lutein (OCUVITE-LUTEIN) CAPS capsule Take 1 capsule by mouth daily.    MYRBETRIQ 25 MG TB24 tablet Take 25 mg by mouth daily.    nitroGLYCERIN (NITROSTAT) 0.4 MG SL tablet Place 1 tablet (0.4 mg total) under the tongue every 5 (five) minutes as needed for chest pain.    ONETOUCH ULTRA test strip Check BS daily Dx E11.69    pantoprazole (PROTONIX) 40 MG tablet Take 40 mg by mouth daily.    pravastatin (PRAVACHOL) 40 MG tablet Take 1 tablet (40 mg total) by mouth daily.    predniSONE (DELTASONE) 20 MG tablet Take 1 tablet (20 mg total) by mouth daily with breakfast.    sodium chloride (OCEAN) 0.65 %  SOLN nasal spray Place 1 spray into both nostrils as needed for congestion.    Vaginal Lubricant (REPLENS) GEL Place 1 application vaginally daily.    No facility-administered encounter medications on file as of 07/11/2021.    Patient Active Problem List   Diagnosis Date Noted   Vaginal yeast infection 04/22/2021   Sinus congestion 10/16/2020   Hyponatremia 12/22/2017   Dysuria 01/22/2017   Pelvic prolapse 08/28/2016   Tremor 01/29/2016   Abnormality of gait 01/29/2016   Type 2 diabetes mellitus with hyperlipidemia ([place])  01/14/2016   Age-related macular degeneration 07/13/2015   Keratoconjunctivitis sicca of both eyes ([place]) 07/13/2015   Meibomian gland dysfunction (MGD), bilateral, both upper and lower lids 07/13/2015   Pseudophakia of both eyes 07/13/2015   Osteoarthritis of lumbar spine 05/08/2015   Spondylosis of lumbar region without myelopathy or radiculopathy 09/01/2014   Diabetes type 2, controlled ([place]) 04/26/2014   Vitamin D deficiency 04/26/2014   Chronic cystitis 04/26/2014   Atrophic vaginitis 04/26/2014   Solitary pulmonary nodule 03/20/2014   Chest pain 03/18/2014   Osteoporotic compression fracture of spine with routine healing 03/08/2014   PALPITATIONS 02/27/2010   Coronary atherosclerosis 12/12/2009   Aortic aneurysm of unspecified site without mention of rupture 06/27/2009   DYSPNEA 06/27/2009   Hyperlipidemia associated with type 2 diabetes mellitus ([place]) 12/12/2008   Essential hypertension 12/12/2008   GERD 12/12/2008   Osteoarthritis 12/12/2008    Conditions to be addressed/monitored:DMII, Osteoarthritis, and labial pain/irritation  Care Plan : [facility] Care Plan  Updates made by Ilean China, RN since 07/11/2021 12:00 AM     Problem: Chronic Disease Management Needs   Priority: High  Onset Date: 06/19/2021     Long-Range Goal: Work with RN Care Manager Regarding Care Management and [place] with Diabetes and Osteoarthritis   Start Date: 06/19/2021  Expected End Date: 06/19/2022  This Visit's Progress: On track  Recent Progress: On track  Priority: High  Note:   Current Barriers:  Chronic Disease Management support and education needs related to DMII and osteoarthritis Transportation barriers  RNCM Clinical Goal(s):  Patient will continue to work with Consulting civil engineer and/or Social Worker to address care management and care coordination needs related to DMII and Osteoarthritis as evidenced by adherence to CM Team Scheduled appointments     through  collaboration with Consulting civil engineer, provider, and care team.   Interventions: 1:1 collaboration with primary care provider regarding development and update of comprehensive plan of care as evidenced by provider attestation and co-signature Inter-disciplinary care team collaboration (see longitudinal plan of care) Evaluation of current treatment plan related to  self management and patient's adherence to plan as established by provider Discussed family/social support Lives alone but has help from daughters as needed Assessed for SDOH needs/barriers Provided with RN Care Manager contact number and encouraged to reach out as needed   Diabetes:  (Status: Condition stable. Not addressed this visit.) Long Term Goal   Lab Results  Component Value Date   HGBA1C 7.7 (H) 05/24/2021  Assessed patient's understanding of A1c goal: <8% Provided education to patient about basic DM disease process; Counseled on importance of regular laboratory monitoring as prescribed;        Discussed plans with patient for ongoing care management follow up and provided patient with direct contact information for care management team;      Reviewed scheduled/upcoming provider appointments including: 09/23/2020 with Dr Lajuana Ripple;         Advised patient, providing  education and rationale, to check cbg daily and record        Review of patient status, including review of consultants reports, relevant laboratory and other test results, and medications completed;       Assessed social determinant of health barriers;        Discussed home blood sugar testing Not testing daily Ranges from 100-130 when she does check it Previously discussed diet and activity level Activity limited by chronic back pain and arthritis   Falls:  (Status: Goal on Track (progressing): YES.) Long Term Goal  Advised patient of importance of notifying provider of falls Assessed for signs and symptoms of orthostatic hypotension Assessed for falls  since last encounter Assessed working status of life alert bracelet and patient adherence Assessed social determinant of health barriers Encouraged patient to move carefully and change positions slowly in order to decrease risk for falls Assessed use of assistive devices Using cane and encouraged to continue to do so   Pain:  (Status: Goal on track: NO.) Long Term Goal  Pain assessment performed Medications reviewed Reviewed provider established plan for pain management; Discussed importance of adherence to all scheduled medical appointments; Counseled on the importance of reporting any/all new or changed pain symptoms or management strategies to pain management provider; Advised patient to report to care team affect of pain on daily activities; Discussed use of relaxation techniques and/or diversional activities to assist with pain reduction (distraction, imagery, relaxation, massage, acupressure, TENS, heat, and cold application; Reviewed with patient prescribed pharmacological and nonpharmacological pain relief strategies; Assessed social determinant of health barriers;    Labial Irritation:  (Status: New goal.) Short Term Goal  Reviewed and discussed recent office visit and prescribed treatments Discussed current symptoms. Continues to have some rawness and discomfort Encouraged to use Replens gel and and to keep area as clean and dry as possible Discussed use of incontinence pads/underwear and importance of changing when wet Recommended to go without incontinence pads when possible to allow exposure to air Advised to reach out to PCP with any new, worsening, or persistent symptoms Discussed use of Estrace 2-3 times a week Advised that she may want to try a different incontinence product. May have a sensitivity to the current one.   Patient Goals/Self-Care Activities: Patient will self administer medications as prescribed as evidenced by self report/primary caregiver report   Patient will attend all scheduled provider appointments as evidenced by clinician review of documented attendance to scheduled appointments and patient/caregiver report Patient will attend church or other social activities as evidenced by patient report Patient will continue to perform ADL's independently as evidenced by patient/caregiver report Patient will call provider office for new concerns or questions as evidenced by review of documented incoming telephone call notes and patient report - check feet daily for cuts, sores or redness - enter blood sugar readings and medication or insulin into daily log - take the blood sugar log to all doctor visits - take the blood sugar meter to all doctor visits - fill half of plate with vegetables - read food labels for fat, fiber, carbohydrates and portion size - wash and dry feet carefully every day - wear comfortable, cotton socks - wear comfortable, well-fitting shoes Use vaginal gel Change pads if wet Go without pads as often as possible Call PCP with any new, worsening, or persistent labial symptoms   Plan:Telephone follow up appointment with care management team member scheduled for:  08/06/21 with RNCM The patient has been provided with contact information  for the care management team and has been advised to call with any health related questions or concerns.   Chong Sicilian, BSN, RN-BC Embedded Chronic Care Manager [facility] Direct Dial: [phone]

## 2021-07-30 DIAGNOSIS — J3489 Other specified disorders of nose and nasal sinuses: Secondary | ICD-10-CM | POA: Diagnosis not present

## 2021-07-30 DIAGNOSIS — J329 Chronic sinusitis, unspecified: Secondary | ICD-10-CM | POA: Diagnosis not present

## 2021-07-30 DIAGNOSIS — R350 Frequency of micturition: Secondary | ICD-10-CM | POA: Diagnosis not present

## 2021-07-30 DIAGNOSIS — R109 Unspecified abdominal pain: Secondary | ICD-10-CM | POA: Diagnosis not present

## 2021-07-30 DIAGNOSIS — R059 Cough, unspecified: Secondary | ICD-10-CM | POA: Diagnosis not present

## 2021-07-31 ENCOUNTER — Ambulatory Visit: Payer: Medicare Other | Admitting: Family Medicine

## 2021-08-06 ENCOUNTER — Ambulatory Visit (INDEPENDENT_AMBULATORY_CARE_PROVIDER_SITE_OTHER): Payer: Medicare Other | Admitting: *Deleted

## 2021-08-06 DIAGNOSIS — N952 Postmenopausal atrophic vaginitis: Secondary | ICD-10-CM

## 2021-08-06 DIAGNOSIS — E785 Hyperlipidemia, unspecified: Secondary | ICD-10-CM

## 2021-08-06 DIAGNOSIS — M1990 Unspecified osteoarthritis, unspecified site: Secondary | ICD-10-CM

## 2021-08-06 DIAGNOSIS — E1169 Type 2 diabetes mellitus with other specified complication: Secondary | ICD-10-CM

## 2021-08-06 NOTE — Chronic Care Management (AMB) (Signed)
Chronic Care Management   CCM RN Visit Note  08/06/2021 Name: Debra Phillips MRN: [id number] DOB: 1929-06-26  Subjective: Debra Phillips is a 87 y.o. year old female who is a primary care patient of [name], DO. The care management team was consulted for assistance with disease management and care coordination needs.    Engaged with patient by telephone for follow up visit in response to provider referral for case management and/or care coordination services.   Consent to Services:  The patient was given information about Chronic Care Management services, agreed to services, and gave verbal consent prior to initiation of services.  Please see initial visit note for detailed documentation.   Patient agreed to services and verbal consent obtained.   Assessment: Review of patient past medical history, allergies, medications, health status, including review of consultants reports, laboratory and other test data, was performed as part of comprehensive evaluation and provision of chronic care management services.   SDOH (Social Determinants of Health) assessments and interventions performed:    CCM Care Plan  Allergies  Allergen Reactions   Codeine Nausea And Vomiting   Doxycycline Hives   Other Nausea Only    Almost all antibiotics   Sulfonamide Derivatives Hives   Vancomycin Itching    Outpatient Encounter Medications as of 08/06/2021  Medication Sig Note   aspirin EC 81 MG tablet Take 81 mg by mouth daily.    calcium carbonate (OS-CAL) 600 MG TABS tablet Take 600 mg by mouth 2 (two) times daily with a meal.    Cholecalciferol (VITAMIN D3) 10 MCG (400 UNIT) CAPS Take by mouth.    dapagliflozin propanediol (FARXIGA) 5 MG TABS tablet Take 1 tablet (5 mg total) by mouth daily before breakfast. 06/19/2021: Patient is unsure if she's taking this   diltiazem (CARDIZEM CD) 180 MG 24 hr capsule Take 1 capsule (180 mg total) by mouth daily.    estradiol (ESTRACE) 0.1  MG/GM vaginal cream APPLY A PEA SIZE AMOUNT PER VAGINA 2-3 NIGHTS A WEEK -FOR VAGINAL USE-    furosemide (LASIX) 20 MG tablet TAKE 1 TABLET DAILY    hydroxypropyl methylcellulose / hypromellose (ISOPTO TEARS / GONIOVISC) 2.5 % ophthalmic solution Place 1 drop into both eyes 3 (three) times daily as needed for dry eyes.    Lancets (ONETOUCH ULTRASOFT) lancets CHECK BLOOD SUGAR ONCE A DAY    lisinopril (ZESTRIL) 40 MG tablet TAKE 1 TABLET DAILY    metFORMIN (GLUCOPHAGE) 1000 MG tablet Take 1 tablet (1,000 mg total) by mouth daily. (with your largest meal of the day) (Patient taking differently: Take 1,000 mg by mouth 2 (two) times daily with a meal.)    multivitamin-lutein (OCUVITE-LUTEIN) CAPS capsule Take 1 capsule by mouth daily.    MYRBETRIQ 25 MG TB24 tablet Take 25 mg by mouth daily.    nitroGLYCERIN (NITROSTAT) 0.4 MG SL tablet Place 1 tablet (0.4 mg total) under the tongue every 5 (five) minutes as needed for chest pain.    ONETOUCH ULTRA test strip Check BS daily Dx E11.69    pantoprazole (PROTONIX) 40 MG tablet Take 40 mg by mouth daily.    pravastatin (PRAVACHOL) 40 MG tablet Take 1 tablet (40 mg total) by mouth daily.    predniSONE (DELTASONE) 20 MG tablet Take 1 tablet (20 mg total) by mouth daily with breakfast.    sodium chloride (OCEAN) 0.65 % SOLN nasal spray Place 1 spray into both nostrils as needed for congestion.    Vaginal Lubricant (REPLENS)  GEL Place 1 application vaginally daily.    No facility-administered encounter medications on file as of 08/06/2021.    Patient Active Problem List   Diagnosis Date Noted   Vaginal yeast infection 04/22/2021   Sinus congestion 10/16/2020   Hyponatremia 12/22/2017   Dysuria 01/22/2017   Pelvic prolapse 08/28/2016   Tremor 01/29/2016   Abnormality of gait 01/29/2016   Type 2 diabetes mellitus with hyperlipidemia ([place]) 01/14/2016   Age-related macular degeneration 07/13/2015   Keratoconjunctivitis sicca of both eyes ([place])  07/13/2015   Meibomian gland dysfunction (MGD), bilateral, both upper and lower lids 07/13/2015   Pseudophakia of both eyes 07/13/2015   Osteoarthritis of lumbar spine 05/08/2015   Spondylosis of lumbar region without myelopathy or radiculopathy 09/01/2014   Diabetes type 2, controlled ([place]) 04/26/2014   Vitamin D deficiency 04/26/2014   Chronic cystitis 04/26/2014   Atrophic vaginitis 04/26/2014   Solitary pulmonary nodule 03/20/2014   Chest pain 03/18/2014   Osteoporotic compression fracture of spine with routine healing 03/08/2014   PALPITATIONS 02/27/2010   Coronary atherosclerosis 12/12/2009   Aortic aneurysm of unspecified site without mention of rupture 06/27/2009   DYSPNEA 06/27/2009   Hyperlipidemia associated with type 2 diabetes mellitus ([place]) 12/12/2008   Essential hypertension 12/12/2008   GERD 12/12/2008   Osteoarthritis 12/12/2008    Conditions to be addressed/monitored:DMII and Osteoarthritis  Care Plan : [facility] Care Plan  Updates made by Ilean China, RN since 08/06/2021 12:00 AM     Problem: Chronic Disease Management Needs   Priority: High  Onset Date: 06/19/2021     Long-Range Goal: Work with RN Care Manager Regarding Care Management and [place] with Diabetes and Osteoarthritis   Start Date: 06/19/2021  Expected End Date: 06/19/2022  This Visit's Progress: On track  Recent Progress: On track  Priority: High  Note:   Current Barriers:  Chronic Disease Management support and education needs related to DMII and osteoarthritis Transportation barriers  RNCM Clinical Goal(s):  Patient will continue to work with Consulting civil engineer and/or Social Worker to address care management and care coordination needs related to DMII and Osteoarthritis as evidenced by adherence to CM Team Scheduled appointments     through collaboration with Consulting civil engineer, provider, and care team.   Interventions: 1:1 collaboration with primary care provider  regarding development and update of comprehensive plan of care as evidenced by provider attestation and co-signature Inter-disciplinary care team collaboration (see longitudinal plan of care) Evaluation of current treatment plan related to  self management and patient's adherence to plan as established by provider Discussed family/social support Lives alone but has help from daughters as needed. They check on her daily. Assessed for SDOH needs/barriers Provided with RN Care Manager contact number and encouraged to reach out as needed   Diabetes:  (Status: Condition stable. Not addressed this visit.) Long Term Goal   Lab Results  Component Value Date   HGBA1C 7.7 (H) 05/24/2021  Assessed patient's understanding of A1c goal: <8% Counseled on importance of regular laboratory monitoring as prescribed;        Discussed plans with patient for ongoing care management follow up and provided patient with direct contact information for care management team;      Reviewed scheduled/upcoming provider appointments including: 09/23/2020 with Dr Lajuana Ripple;         Advised patient, providing education and rationale, to check cbg once daily and when you have symptoms of low or high blood sugar and record  Review of patient status, including review of consultants reports, relevant laboratory and other test results, and medications completed;       Assessed social determinant of health barriers;        Discussed home blood sugar testing Not testing daily Ranges from 100-130 when she does check it Previously discussed diet and activity level Activity limited by chronic back pain and arthritis Reviewed and reminded and upcoming appointments   Falls:  (Status: Goal on Track (progressing): YES.) Long Term Goal  Advised patient of importance of notifying provider of falls Assessed for signs and symptoms of orthostatic hypotension Assessed for falls since last encounter Assessed working status of life  alert bracelet and patient adherence Assessed social determinant of health barriers Encouraged patient to move carefully and change positions slowly in order to decrease risk for falls Assessed use of assistive devices Using cane and encouraged to continue to do so Therapeutic listening utilized regarding chronic pain and precautions that she takes to not fall   Pain:  (Status: Goal on track: NO.) Long Term Goal  Pain assessment performed Medications reviewed Reviewed provider established plan for pain management; Discussed importance of adherence to all scheduled medical appointments; Counseled on the importance of reporting any/all new or changed pain symptoms or management strategies to pain management provider; Advised patient to report to care team affect of pain on daily activities; Reviewed with patient prescribed pharmacological and nonpharmacological pain relief strategies; Assessed social determinant of health barriers;  Therapeutic listening utilized regarding chronic pain and how she pushes herself to continue to stay active and not sedentary    Labial Irritation:  (Status: Goal on Track (progressing): YES.) Short Term Goal  Reviewed and discussed recent office visit and prescribed treatments Discussed current symptoms. Symptoms have improved. Encouraged to use Replens gel and and to keep area as clean and dry as possible Reminded to change incontinence pads when wet Recommended to go without incontinence pads when possible to allow exposure to air Advised to reach out to PCP with any new, worsening, or persistent symptoms Discussed use of Estrace 2-3 times a week Advised that she may want to try a different incontinence product. May have a sensitivity to the current one.   Patient Goals/Self-Care Activities: Patient will self administer medications as prescribed as evidenced by self report/primary caregiver report  Patient will attend all scheduled provider appointments  as evidenced by clinician review of documented attendance to scheduled appointments and patient/caregiver report Patient will attend church or other social activities as evidenced by patient report Patient will continue to perform ADL's independently as evidenced by patient/caregiver report Patient will call provider office for new concerns or questions as evidenced by review of documented incoming telephone call notes and patient report - check feet daily for cuts, sores or redness - enter blood sugar readings and medication or insulin into daily log - take the blood sugar log to all doctor visits - take the blood sugar meter to all doctor visits - fill half of plate with vegetables - read food labels for fat, fiber, carbohydrates and portion size - wash and dry feet carefully every day - wear comfortable, cotton socks - wear comfortable, well-fitting shoes Use vaginal gel Change pads if wet Go without pads as often as possible Call PCP with any new, worsening, or persistent labial symptoms  Plan:Telephone follow up appointment with care management team member scheduled for:  09/06/21 with RNCM The patient has been provided with contact information for the care management team  and has been advised to call with any health related questions or concerns.    Chong Sicilian, BSN, RN-BC Embedded Chronic Care Manager [facility] [place] [facility] Direct Dial: [phone]

## 2021-08-06 NOTE — Patient Instructions (Signed)
Visit Information  Patient Goals/Self-Care Activities: Patient will self administer medications as prescribed as evidenced by self report/primary caregiver report  Patient will attend all scheduled provider appointments as evidenced by clinician review of documented attendance to scheduled appointments and patient/caregiver report Patient will attend church or other social activities as evidenced by patient report Patient will continue to perform ADL's independently as evidenced by patient/caregiver report Patient will call provider office for new concerns or questions as evidenced by review of documented incoming telephone call notes and patient report - check feet daily for cuts, sores or redness - enter blood sugar readings and medication or insulin into daily log - take the blood sugar log to all doctor visits - take the blood sugar meter to all doctor visits - fill half of plate with vegetables - read food labels for fat, fiber, carbohydrates and portion size - wash and dry feet carefully every day - wear comfortable, cotton socks - wear comfortable, well-fitting shoes Use vaginal gel Change pads if wet Go without pads as often as possible Call PCP with any new, worsening, or persistent labial symptoms  The patient verbalized understanding of instructions, educational materials, and care plan provided today and declined offer to receive copy of patient instructions, educational materials, and care plan.   Plan:Telephone follow up appointment with care management team member scheduled for:  09/06/21 with RNCM The patient has been provided with contact information for the care management team and has been advised to call with any health related questions or concerns.    Chong Sicilian, BSN, RN-BC Embedded Chronic Care Manager [facility] [place] [facility] Direct Dial: [phone]

## 2021-09-06 ENCOUNTER — Ambulatory Visit (INDEPENDENT_AMBULATORY_CARE_PROVIDER_SITE_OTHER): Payer: Medicare Other | Admitting: Family Medicine

## 2021-09-06 ENCOUNTER — Encounter: Payer: Self-pay | Admitting: Family Medicine

## 2021-09-06 ENCOUNTER — Telehealth: Payer: Medicare Other

## 2021-09-06 VITALS — BP 154/76 | HR 79 | Temp 98.1°F | Ht 64.0 in | Wt 151.0 lb

## 2021-09-06 DIAGNOSIS — L22 Diaper dermatitis: Secondary | ICD-10-CM

## 2021-09-06 DIAGNOSIS — N3000 Acute cystitis without hematuria: Secondary | ICD-10-CM | POA: Diagnosis not present

## 2021-09-06 DIAGNOSIS — R300 Dysuria: Secondary | ICD-10-CM | POA: Diagnosis not present

## 2021-09-06 DIAGNOSIS — B372 Candidiasis of skin and nail: Secondary | ICD-10-CM

## 2021-09-06 DIAGNOSIS — R682 Dry mouth, unspecified: Secondary | ICD-10-CM

## 2021-09-06 MED ORDER — NYSTATIN-TRIAMCINOLONE 100000-0.1 UNIT/GM-% EX OINT: 1.0000 "application " | TOPICAL_OINTMENT | Freq: Two times a day (BID) | CUTANEOUS | 0 refills | Status: DC

## 2021-09-06 MED ORDER — BIOTENE DRY MOUTH MOISTURIZING MT SOLN: 5.0000 mL | Freq: Every evening | OROMUCOSAL | 1 refills | Status: DC

## 2021-09-06 MED ORDER — FLUCONAZOLE 150 MG PO TABS: 150.0000 mg | ORAL_TABLET | Freq: Once | ORAL | 0 refills | Status: AC

## 2021-09-06 NOTE — Progress Notes (Signed)
Subjective:  Patient ID: Debra Phillips, female    DOB: June 27, 1929, 88 y.o.   MRN: [id number]  Patient Care Team: [name], DO as PCP - General (Family Medicine) Minus Breeding, MD as PCP - Cardiology (Cardiology) Irine Seal, MD as Consulting Physician (Urology) Minus Breeding, MD as Consulting Physician (Cardiology) Glenna Fellows, MD as Attending Physician (Neurosurgery) Dorene Ar, MD as Consulting Physician (Pain Medicine) Bunnie Pion, MD as Consulting Physician (Ophthalmology) Gala Romney Cristopher Estimable, MD as Consulting Physician (Gastroenterology) Ilean China, RN as Case Manager   Chief Complaint:  Rash (Rectal rash)   HPI: Debra Phillips is a 88 y.o. female presenting on 09/06/2021 for Rash (Rectal rash)   Rash This is a recurrent problem. The current episode started more than 1 month ago. The problem has been waxing and waning since onset. The affected locations include the groin and genitalia. The rash is characterized by itchiness, redness and pain. Associated with: wears incontience pads daily. Pertinent negatives include no cough, fatigue, fever or shortness of breath. Treatments tried: frequently changing pads, diaper rash cream. The treatment provided no relief.  Pt also complains of ongoing dry mouth, worse at night. Has not tried anything for symptoms. Denies pain or lesions in mouth.   Relevant past medical, surgical, family, and social history reviewed and updated as indicated.  Allergies and medications reviewed and updated. Data reviewed: Chart in Epic.   Past Medical History:  Diagnosis Date   Abnormality of gait 01/29/2016   Aortic root dilatation (HCC)    Mild   Cataract    Diabetes mellitus without complication (HCC)    type 2   Dyslipidemia    Dyspnea    with exertion   Dysrhythmia    hx of occ palpitations   Esophageal stricture    s/p dilation   GERD (gastroesophageal reflux disease)    Headache    History of hiatal  hernia    Hyperlipidemia    Hypertension    Macular pucker, left eye    Osteoarthritis    Osteoporosis    Tinnitus    left ear last week or so    Past Surgical History:  Procedure Laterality Date   ABDOMINAL HYSTERECTOMY     partial   APPENDECTOMY     BACK SURGERY     lower x 1   CARDIAC CATHETERIZATION  2010   both cataracts   CHOLECYSTECTOMY  1993   CYSTOSCOPY N/A 08/28/2016   Procedure: CYSTOSCOPY;  Surgeon: Carolan Clines, MD;  Location: [facility];  Service: Urology;  Laterality: N/A;   EYE SURGERY     cataracts   EYE SURGERY     for macular pucker   FRACTURE SURGERY     rt wrist2010   HARDWARE REMOVAL  12/10/2011   Procedure: HARDWARE REMOVAL;  Surgeon: Schuyler Amor, MD;  Location: [place];  Service: Orthopedics;  Laterality: Right;  radial head   KNEE ARTHROSCOPY  2004   Left and right   KYPHOSIS SURGERY     LUMBAR FUSION  2000,2003   RADIAL HEAD ARTHROPLASTY  5/12   orif rt radial head   SHOULDER SURGERY     Right rotator cuff epair   TOTAL KNEE ARTHROPLASTY     Left   VAGINAL PROLAPSE REPAIR N/A 08/28/2016   Procedure: ANTERIOR VAGINAL VAULT SUSPENSION WITH SACROSPINOUS FIXATION;  Surgeon: Carolan Clines, MD;  Location: [facility];  Service: Urology;  Laterality: N/A;    Social  History   Socioeconomic History   Marital status: Widowed    Spouse name: Not on file   Number of children: 5   Years of education: 5   Highest education level: Not on file  Occupational History   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    Comment: Retired  Tobacco Use   Smoking status: Never   Smokeless tobacco: Never  Scientific laboratory technician Use: Never used  Substance and Sexual Activity   Alcohol use: No   Drug use: No   Sexual activity: Not Currently  Other Topics Concern   Not on file  Social History Narrative   Pt lives at home alone   Husband w/ Alzheimer's passed away   Right-handed   Drinks 1-2 cups of coffee daily   Social  Determinants of Health   Financial Resource Strain: Not on file  Food Insecurity: Not on file  Transportation Needs: Not on file  Physical Activity: Not on file  Stress: Not on file  Social Connections: Not on file  Intimate Partner Violence: Not on file    Outpatient Encounter Medications as of 09/06/2021  Medication Sig   Artificial Saliva (BIOTENE DRY MOUTH MOISTURIZING) SOLN Use as directed 5 mLs in the mouth or throat at bedtime.   aspirin EC 81 MG tablet Take 81 mg by mouth daily.   calcium carbonate (OS-CAL) 600 MG TABS tablet Take 600 mg by mouth 2 (two) times daily with a meal.   Cholecalciferol (VITAMIN D3) 10 MCG (400 UNIT) CAPS Take by mouth.   dapagliflozin propanediol (FARXIGA) 5 MG TABS tablet Take 1 tablet (5 mg total) by mouth daily before breakfast.   diltiazem (CARDIZEM CD) 180 MG 24 hr capsule Take 1 capsule (180 mg total) by mouth daily.   estradiol (ESTRACE) 0.1 MG/GM vaginal cream APPLY A PEA SIZE AMOUNT PER VAGINA 2-3 NIGHTS A WEEK -FOR VAGINAL USE-   fluconazole (DIFLUCAN) 150 MG tablet Take 1 tablet (150 mg total) by mouth once for 1 dose.   furosemide (LASIX) 20 MG tablet TAKE 1 TABLET DAILY   hydroxypropyl methylcellulose / hypromellose (ISOPTO TEARS / GONIOVISC) 2.5 % ophthalmic solution Place 1 drop into both eyes 3 (three) times daily as needed for dry eyes.   Lancets (ONETOUCH ULTRASOFT) lancets CHECK BLOOD SUGAR ONCE A DAY   lisinopril (ZESTRIL) 40 MG tablet TAKE 1 TABLET DAILY   metFORMIN (GLUCOPHAGE) 1000 MG tablet Take 1 tablet (1,000 mg total) by mouth daily. (with your largest meal of the day) (Patient taking differently: Take 1,000 mg by mouth 2 (two) times daily with a meal.)   multivitamin-lutein (OCUVITE-LUTEIN) CAPS capsule Take 1 capsule by mouth daily.   MYRBETRIQ 25 MG TB24 tablet Take 25 mg by mouth daily.   nitroGLYCERIN (NITROSTAT) 0.4 MG SL tablet Place 1 tablet (0.4 mg total) under the tongue every 5 (five) minutes as needed for chest  pain.   nystatin-triamcinolone ointment (MYCOLOG) Apply 1 application topically 2 (two) times daily.   ONETOUCH ULTRA test strip Check BS daily Dx E11.69   pantoprazole (PROTONIX) 40 MG tablet Take 40 mg by mouth daily.   pravastatin (PRAVACHOL) 40 MG tablet Take 1 tablet (40 mg total) by mouth daily.   predniSONE (DELTASONE) 20 MG tablet Take 1 tablet (20 mg total) by mouth daily with breakfast.   sodium chloride (OCEAN) 0.65 % SOLN nasal spray Place 1 spray into both nostrils as needed for congestion.   Vaginal Lubricant (REPLENS) GEL Place  1 application vaginally daily.   No facility-administered encounter medications on file as of 09/06/2021.    Allergies  Allergen Reactions   Codeine Nausea And Vomiting   Doxycycline Hives   Other Nausea Only    Almost all antibiotics   Sulfonamide Derivatives Hives   Vancomycin Itching    Review of Systems  Constitutional:  Negative for activity change, appetite change, chills, diaphoresis, fatigue, fever and unexpected weight change.  HENT:         Dry mouth  Respiratory:  Negative for cough and shortness of breath.   Cardiovascular:  Negative for chest pain, palpitations and leg swelling.  Gastrointestinal:  Negative for abdominal pain.  Genitourinary:  Positive for dysuria and vaginal pain. Negative for decreased urine volume, difficulty urinating, dyspareunia, enuresis, flank pain, frequency, urgency, vaginal bleeding and vaginal discharge.  Musculoskeletal:  Negative for back pain.  Skin:  Positive for color change and rash. Negative for pallor and wound.  Neurological:  Negative for weakness.  All other systems reviewed and are negative.      Objective:  BP (!) 154/76    Pulse 79    Temp 98.1 F (36.7 C)    Ht 5\' 4"  (1.626 m)    Wt 151 lb (68.5 kg)    SpO2 97%    BMI 25.92 kg/m    Wt Readings from Last 3 Encounters:  09/06/21 151 lb (68.5 kg)  07/02/21 151 lb (68.5 kg)  05/24/21 151 lb 12.8 oz (68.9 kg)    Physical  Exam Vitals and nursing note reviewed.  Constitutional:      General: She is not in acute distress.    Appearance: Normal appearance. She is not ill-appearing, toxic-appearing or diaphoretic.  HENT:     Head: Normocephalic and atraumatic.     Nose: Nose normal.     Mouth/Throat:     Lips: Pink. No lesions.     Mouth: Mucous membranes are dry. No injury, lacerations, oral lesions or angioedema.     Pharynx: Oropharynx is clear. No oropharyngeal exudate or posterior oropharyngeal erythema.     Tonsils: No tonsillar exudate or tonsillar abscesses.  Eyes:     Pupils: Pupils are equal, round, and reactive to light.  Cardiovascular:     Rate and Rhythm: Normal rate and regular rhythm.     Heart sounds: Normal heart sounds.  Pulmonary:     Effort: Pulmonary effort is normal.     Breath sounds: Normal breath sounds.  Genitourinary:    Exam position: Lithotomy position.     Labia:        Right: Rash and tenderness present. No lesion or injury.        Left: Rash and tenderness present. No lesion or injury.      Comments: Beefy red rash to bilateral labia and groin area.  Skin:    General: Skin is warm and dry.     Capillary Refill: Capillary refill takes less than 2 seconds.  Neurological:     General: No focal deficit present.     Mental Status: She is alert. Mental status is at baseline.     Gait: Gait abnormal (using cane).    Results for orders placed or performed in visit on 07/02/21  Urine Culture   Specimen: Urine   UR  Result Value Ref Range   Urine Culture, Routine Final report (A)    Organism ID, Bacteria Klebsiella pneumoniae (A)    Antimicrobial Susceptibility Comment   Microscopic Examination  Urine  Result Value Ref Range   WBC, UA 0-5 0 - 5 /hpf   RBC 0-2 0 - 2 /hpf   Epithelial Cells (non renal) 0-10 0 - 10 /hpf   Renal Epithel, UA None seen None seen /hpf   Bacteria, UA Few (A) None seen/Few  Urinalysis, Routine w reflex microscopic  Result Value Ref Range    Specific Gravity, UA <1.005 (L) 1.005 - 1.030   pH, UA 5.5 5.0 - 7.5   Color, UA Yellow Yellow   Appearance Ur Clear Clear   Leukocytes,UA Trace (A) Negative   Protein,UA Negative Negative/Trace   Glucose, UA Negative Negative   Ketones, UA Negative Negative   RBC, UA Negative Negative   Bilirubin, UA Negative Negative   Urobilinogen, Ur 0.2 0.2 - 1.0 mg/dL   Nitrite, UA Negative Negative   Microscopic Examination See below:        Pertinent labs & imaging results that were available during my care of the patient were reviewed by me and considered in my medical decision making.  Assessment & Plan:  Riva was seen today for rash.  Diagnoses and all orders for this visit:  Candidal diaper dermatitis Good hygiene discussed in detail. Will treat with oral antifungal x 1 along with topical mycolog for 7 days. After completion of mycolog, pt to use protective barrier cream daily.  -     fluconazole (DIFLUCAN) 150 MG tablet; Take 1 tablet (150 mg total) by mouth once for 1 dose. -     nystatin-triamcinolone ointment (MYCOLOG); Apply 1 application topically 2 (two) times daily.  Dysuria Will obtain urinalysis and treat if warranted.  -     Urinalysis, Routine w reflex microscopic -     Urine Culture  Dry mouth Has not tried anything for symptoms. No acute findings. Will trial biotene as prescribed. Report any new, worsening, or persistent symptoms.  -     Artificial Saliva (BIOTENE DRY MOUTH MOISTURIZING) SOLN; Use as directed 5 mLs in the mouth or throat at bedtime.     Continue all other maintenance medications.  Follow up plan: Return in about 4 weeks (around 10/04/2021), or if symptoms worsen or fail to improve, for rash.   Continue healthy lifestyle choices, including diet (rich in fruits, vegetables, and lean proteins, and low in salt and simple carbohydrates) and exercise (at least 30 minutes of moderate physical activity daily).   The above assessment and management  plan was discussed with the patient. The patient verbalized understanding of and has agreed to the management plan. Patient is aware to call the clinic if they develop any new symptoms or if symptoms persist or worsen. Patient is aware when to return to the clinic for a follow-up visit. Patient educated on when it is appropriate to go to the emergency department.   Monia Pouch, FNP-C [facility] [phone]

## 2021-09-10 ENCOUNTER — Telehealth: Payer: Medicare Other | Admitting: *Deleted

## 2021-09-12 ENCOUNTER — Other Ambulatory Visit: Payer: Self-pay

## 2021-09-12 ENCOUNTER — Encounter: Payer: Self-pay | Admitting: Nurse Practitioner

## 2021-09-12 ENCOUNTER — Ambulatory Visit (INDEPENDENT_AMBULATORY_CARE_PROVIDER_SITE_OTHER): Payer: Medicare Other | Admitting: Nurse Practitioner

## 2021-09-12 VITALS — BP 144/76 | HR 88 | Temp 96.7°F | Resp 20 | Ht 64.0 in | Wt 151.0 lb

## 2021-09-12 DIAGNOSIS — R300 Dysuria: Secondary | ICD-10-CM | POA: Diagnosis not present

## 2021-09-12 DIAGNOSIS — K219 Gastro-esophageal reflux disease without esophagitis: Secondary | ICD-10-CM | POA: Diagnosis not present

## 2021-09-12 LAB — MICROSCOPIC EXAMINATION
RBC, Urine: NONE SEEN /hpf (ref 0–2)
Renal Epithel, UA: NONE SEEN /hpf

## 2021-09-12 LAB — URINALYSIS, ROUTINE W REFLEX MICROSCOPIC
Bilirubin, UA: NEGATIVE
Ketones, UA: NEGATIVE
Nitrite, UA: NEGATIVE
Protein,UA: NEGATIVE
RBC, UA: NEGATIVE
Specific Gravity, UA: 1.01 (ref 1.005–1.030)
Urobilinogen, Ur: 0.2 mg/dL (ref 0.2–1.0)
pH, UA: 5 (ref 5.0–7.5)

## 2021-09-12 MED ORDER — PANTOPRAZOLE SODIUM 40 MG PO TBEC: 40.0000 mg | DELAYED_RELEASE_TABLET | Freq: Every day | ORAL | 1 refills | Status: DC

## 2021-09-12 NOTE — Progress Notes (Signed)
° °  Subjective:    Patient ID: Debra Phillips, female    DOB: 02/08/1929, 88 y.o.   MRN: [id number]   Chief Complaint: Gastroesophageal Reflux   Gastroesophageal Reflux She complains of nausea. She reports no abdominal pain or no chest pain.  Patient comes in c/o acid reflux. Really bothered her yesterday. She tries to avoid spicy foods . Not sure what caused it. Had slight  nausea. She is suppose to be on protonix but is not sure if she is taking it or not.    Review of Systems  Respiratory:  Negative for shortness of breath.   Cardiovascular:  Negative for chest pain, palpitations and leg swelling.  Gastrointestinal:  Positive for nausea. Negative for abdominal pain, constipation, diarrhea and vomiting.  All other systems reviewed and are negative.     Objective:   Physical Exam Vitals reviewed.  Constitutional:      Appearance: Normal appearance.  Cardiovascular:     Rate and Rhythm: Normal rate and regular rhythm.     Heart sounds: Normal heart sounds.  Pulmonary:     Breath sounds: Normal breath sounds.  Abdominal:     General: Abdomen is flat.     Palpations: Abdomen is soft.  Skin:    General: Skin is warm.  Neurological:     General: No focal deficit present.     Mental Status: She is alert and oriented to person, place, and time.  Psychiatric:        Mood and Affect: Mood normal.        Behavior: Behavior normal.   BP (!) 144/76    Pulse 88    Temp (!) 96.7 F (35.9 C) (Temporal)    Resp 20    Ht 5\' 4"  (1.626 m)    Wt 151 lb (68.5 kg)    SpO2 98%    BMI 25.92 kg/m         Assessment & Plan:  Debra Phillips in today with chief complaint of Gastroesophageal Reflux   1. Gastroesophageal reflux disease without esophagitis Avoid spicy foods Do not eat 2 hours prior to bedtime Meds ordered this encounter  Medications   pantoprazole (PROTONIX) 40 MG tablet    Sig: Take 1 tablet (40 mg total) by mouth daily.    Dispense:  90 tablet    Refill:  1     Order Specific Question:   Supervising Provider    Answer:   Caryl Pina A [[id number]]       The above assessment and management plan was discussed with the patient. The patient verbalized understanding of and has agreed to the management plan. Patient is aware to call the clinic if symptoms persist or worsen. Patient is aware when to return to the clinic for a follow-up visit. Patient educated on when it is appropriate to go to the emergency department.   Mary-Margaret Hassell Done, FNP

## 2021-09-12 NOTE — Patient Instructions (Signed)
Food Choices for Gastroesophageal Reflux Disease, Adult °When you have gastroesophageal reflux disease (GERD), the foods you eat and your eating habits are very important. Choosing the right foods can help ease the discomfort of GERD. Consider working with a dietitian to help you make healthy food choices. °What are tips for following this plan? °Reading food labels °Look for foods that are low in saturated fat. Foods that have less than 5% of daily value (DV) of fat and 0 g of trans fats may help with your symptoms. °Cooking °Cook foods using methods other than frying. This may include baking, steaming, grilling, or broiling. These are all methods that do not need a lot of fat for cooking. °To add flavor, try to use herbs that are low in spice and acidity. °Meal planning ° °Choose healthy foods that are low in fat, such as fruits, vegetables, whole grains, low-fat dairy products, lean meats, fish, and poultry. °Eat frequent, small meals instead of three large meals each day. Eat your meals slowly, in a relaxed setting. Avoid bending over or lying down until 2-3 hours after eating. °Limit high-fat foods such as fatty meats or fried foods. °Limit your intake of fatty foods, such as oils, butter, and shortening. °Avoid the following as told by your health care provider: °Foods that cause symptoms. These may be different for different people. Keep a food diary to keep track of foods that cause symptoms. °Alcohol. °Drinking large amounts of liquid with meals. °Eating meals during the 2-3 hours before bed. °Lifestyle °Maintain a healthy weight. Ask your health care provider what weight is healthy for you. If you need to lose weight, work with your health care provider to do so safely. °Exercise for at least 30 minutes on 5 or more days each week, or as told by your health care provider. °Avoid wearing clothes that fit tightly around your waist and chest. °Do not use any products that contain nicotine or tobacco. These  products include cigarettes, chewing tobacco, and vaping devices, such as e-cigarettes. If you need help quitting, ask your health care provider. °Sleep with the head of your bed raised. Use a wedge under the mattress or blocks under the bed frame to raise the head of the bed. °Chew sugar-free gum after mealtimes. °What foods should I eat? °Eat a healthy, well-balanced diet of fruits, vegetables, whole grains, low-fat dairy products, lean meats, fish, and poultry. Each person is different. Foods that may trigger symptoms in one person may not trigger any symptoms in another person. Work with your health care provider to identify foods that are safe for you. °The items listed above may not be a complete list of recommended foods and beverages. Contact a dietitian for more information. °What foods should I avoid? °Limiting some of these foods may help manage the symptoms of GERD. Everyone is different. Consult a dietitian or your health care provider to help you identify the exact foods to avoid, if any. °Fruits °Any fruits prepared with added fat. Any fruits that cause symptoms. For some people this may include citrus fruits, such as oranges, grapefruit, pineapple, and lemons. °Vegetables °Deep-fried vegetables. French fries. Any vegetables prepared with added fat. Any vegetables that cause symptoms. For some people, this may include tomatoes and tomato products, chili peppers, onions and garlic, and horseradish. °Grains °Pastries or quick breads with added fat. °Meats and other proteins °High-fat meats, such as fatty beef or pork, hot dogs, ribs, ham, sausage, salami, and bacon. Fried meat or protein, including fried   fish and fried chicken. Nuts and nut butters, in large amounts. °Dairy °Whole milk and chocolate milk. Sour cream. Cream. Ice cream. Cream cheese. Milkshakes. °Fats and oils °Butter. Margarine. Shortening. Ghee. °Beverages °Coffee and tea, with or without caffeine. Carbonated beverages. Sodas. Energy  drinks. Fruit juice made with acidic fruits, such as orange or grapefruit. Tomato juice. Alcoholic drinks. °Sweets and desserts °Chocolate and cocoa. Donuts. °Seasonings and condiments °Pepper. Peppermint and spearmint. Added salt. Any condiments, herbs, or seasonings that cause symptoms. For some people, this may include curry, hot sauce, or vinegar-based salad dressings. °The items listed above may not be a complete list of foods and beverages to avoid. Contact a dietitian for more information. °Questions to ask your health care provider °Diet and lifestyle changes are usually the first steps that are taken to manage symptoms of GERD. If diet and lifestyle changes do not improve your symptoms, talk with your health care provider about taking medicines. °Where to find more information °International [employer] Gastrointestinal Disorders: aboutgerd.org °Summary °When you have gastroesophageal reflux disease (GERD), food and lifestyle choices may be very helpful in easing the discomfort of GERD. °Eat frequent, small meals instead of three large meals each day. Eat your meals slowly, in a relaxed setting. Avoid bending over or lying down until 2-3 hours after eating. °Limit high-fat foods such as fatty meats or fried foods. °This information is not intended to replace advice given to you by your health care provider. Make sure you discuss any questions you have with your health care provider. °Document Revised: 02/20/2020 Document Reviewed: 02/20/2020 °Elsevier Patient Education © 2022 [employer] ° °

## 2021-09-14 LAB — URINE CULTURE

## 2021-09-14 MED ORDER — AMOXICILLIN-POT CLAVULANATE 875-125 MG PO TABS: 1.0000 | ORAL_TABLET | Freq: Two times a day (BID) | ORAL | 0 refills | Status: AC

## 2021-09-14 NOTE — Addendum Note (Signed)
Addended by: Baruch Gouty on: 09/14/2021 10:13 AM   Modules accepted: Orders

## 2021-09-20 ENCOUNTER — Other Ambulatory Visit: Payer: Self-pay | Admitting: Family Medicine

## 2021-09-23 ENCOUNTER — Ambulatory Visit (INDEPENDENT_AMBULATORY_CARE_PROVIDER_SITE_OTHER): Payer: Medicare Other | Admitting: Family Medicine

## 2021-09-23 ENCOUNTER — Encounter: Payer: Self-pay | Admitting: Family Medicine

## 2021-09-23 VITALS — BP 133/75 | HR 64 | Temp 97.6°F | Ht 64.0 in | Wt 151.0 lb

## 2021-09-23 DIAGNOSIS — I152 Hypertension secondary to endocrine disorders: Secondary | ICD-10-CM

## 2021-09-23 DIAGNOSIS — N898 Other specified noninflammatory disorders of vagina: Secondary | ICD-10-CM

## 2021-09-23 DIAGNOSIS — E1159 Type 2 diabetes mellitus with other circulatory complications: Secondary | ICD-10-CM | POA: Diagnosis not present

## 2021-09-23 DIAGNOSIS — E1169 Type 2 diabetes mellitus with other specified complication: Secondary | ICD-10-CM | POA: Diagnosis not present

## 2021-09-23 DIAGNOSIS — E785 Hyperlipidemia, unspecified: Secondary | ICD-10-CM | POA: Diagnosis not present

## 2021-09-23 LAB — MICROSCOPIC EXAMINATION
Bacteria, UA: NONE SEEN
Epithelial Cells (non renal): NONE SEEN /hpf (ref 0–10)
RBC, Urine: NONE SEEN /hpf (ref 0–2)
Renal Epithel, UA: NONE SEEN /hpf
WBC, UA: NONE SEEN /hpf (ref 0–5)

## 2021-09-23 LAB — URINALYSIS, ROUTINE W REFLEX MICROSCOPIC
Bilirubin, UA: NEGATIVE
Ketones, UA: NEGATIVE
Leukocytes,UA: NEGATIVE
Nitrite, UA: NEGATIVE
Protein,UA: NEGATIVE
RBC, UA: NEGATIVE
Specific Gravity, UA: <1.005 — ABNORMAL LOW (ref 1.005–1.030)
Urobilinogen, Ur: 0.2 mg/dL (ref 0.2–1.0)
pH, UA: 5 (ref 5.0–7.5)

## 2021-09-23 LAB — BAYER DCA HB A1C WAIVED: HB A1C (BAYER DCA - WAIVED): 7.3 % — ABNORMAL HIGH (ref 4.8–5.6)

## 2021-09-23 NOTE — Patient Instructions (Signed)
Check your medications and BRING THEM TO YOUR NEXT VISIT.  Ok to use the Furosemide once daily AS needed instead of once every day for swelling.  This medication causes you to urinate and can cause dry mouth.

## 2021-09-23 NOTE — Progress Notes (Signed)
Subjective: CC:DM PCP: [name], DO ESP:[id number] Debra Phillips is a 88 y.o. female presenting to clinic today for:  1. Type 2 Diabetes with hypertension  She reports compliance with Farxiga, metformin, Pravachol, lisinopril.  Reports vaginal irritation.  She has been having some vision issues for a year.  Last eye exam: 07/23/21 Last foot exam: needs Last A1c:  Lab Results  Component Value Date   HGBA1C 7.7 (H) 05/24/2021   Nephropathy screen indicated?: on ACE-I Last flu, zoster and/or pneumovax:  Immunization History  Administered Date(s) Administered   Fluad Quad(high Dose 65+) 07/20/2019, 05/24/2021   Hepatitis B 07/02/1998, 08/06/1998, 01/07/1999   Influenza Inj Mdck Quad With Preservative 07/13/2020   Influenza Split 05/31/2010, 06/11/2011, 06/06/2013, 07/10/2021   Influenza, High Dose Seasonal PF 06/13/2016, 06/18/2017, 06/29/2018   Influenza, Seasonal, Injecte, Preservative Fre 06/29/2009, 06/04/2012   Influenza,inj,Quad PF,6+ Mos 06/15/2015   Influenza-Unspecified 06/17/1996, 06/14/1997, 06/12/1998, 05/31/1999, 06/15/2001, 06/09/2002, 06/07/2003, 04/25/2013, 05/10/2014, 06/13/2016, 06/18/2017, 06/29/2018, 07/13/2020   Moderna Sars-Covid-2 Vaccination 10/10/2019, 11/21/2019   Pneumococcal Conjugate-13 08/24/2013   Pneumococcal Polysaccharide-23 06/17/1996   Td 02/17/2017   Tdap 02/17/2017   Zoster, Live 03/17/2014    ROS: Denies CP, SOB. Edema is stable.    ROS: Per HPI  Allergies  Allergen Reactions   Codeine Nausea And Vomiting   Doxycycline Hives   Other Nausea Only    Almost all antibiotics   Sulfonamide Derivatives Hives   Vancomycin Itching   Past Medical History:  Diagnosis Date   Abnormality of gait 01/29/2016   Aortic root dilatation (HCC)    Mild   Cataract    Diabetes mellitus without complication (HCC)    type 2   Dyslipidemia    Dyspnea    with exertion   Dysrhythmia    hx of occ palpitations   Esophageal stricture    s/p  dilation   GERD (gastroesophageal reflux disease)    Headache    History of hiatal hernia    Hyperlipidemia    Hypertension    Macular pucker, left eye    Osteoarthritis    Osteoporosis    Tinnitus    left ear last week or so    Current Outpatient Medications:    Artificial Saliva (BIOTENE DRY MOUTH MOISTURIZING) SOLN, Use as directed 5 mLs in the mouth or throat at bedtime., Disp: 44.3 mL, Rfl: 1   aspirin EC 81 MG tablet, Take 81 mg by mouth daily., Disp: , Rfl:    calcium carbonate (OS-CAL) 600 MG TABS tablet, Take 600 mg by mouth 2 (two) times daily with a meal., Disp: , Rfl:    Cholecalciferol (VITAMIN D3) 10 MCG (400 UNIT) CAPS, Take by mouth., Disp: , Rfl:    dapagliflozin propanediol (FARXIGA) 5 MG TABS tablet, Take 1 tablet (5 mg total) by mouth daily before breakfast., Disp: 90 tablet, Rfl: 1   diltiazem (CARDIZEM CD) 180 MG 24 hr capsule, Take 1 capsule (180 mg total) by mouth daily., Disp: 90 capsule, Rfl: 3   estradiol (ESTRACE) 0.1 MG/GM vaginal cream, APPLY A PEA SIZE AMOUNT PER VAGINA 2-3 NIGHTS A WEEK -FOR VAGINAL USE-, Disp: 42.5 g, Rfl: 6   furosemide (LASIX) 20 MG tablet, TAKE 1 TABLET DAILY, Disp: 30 tablet, Rfl: 2   hydroxypropyl methylcellulose / hypromellose (ISOPTO TEARS / GONIOVISC) 2.5 % ophthalmic solution, Place 1 drop into both eyes 3 (three) times daily as needed for dry eyes., Disp: , Rfl:    Lancets (ONETOUCH DELICA PLUS [id number]) [place], [place]  BLOOD SUGAR ONCE A DAY Dx E11.69, Disp: 100 each, Rfl: 4   lisinopril (ZESTRIL) 40 MG tablet, TAKE 1 TABLET DAILY, Disp: 90 tablet, Rfl: 1   metFORMIN (GLUCOPHAGE) 1000 MG tablet, Take 1 tablet (1,000 mg total) by mouth daily. (with your largest meal of the day) (Patient taking differently: Take 1,000 mg by mouth 2 (two) times daily with a meal.), Disp: 90 tablet, Rfl: 3   multivitamin-lutein (OCUVITE-LUTEIN) CAPS capsule, Take 1 capsule by mouth daily., Disp: , Rfl:    MYRBETRIQ 25 MG TB24 tablet, Take 25 mg by mouth  daily., Disp: , Rfl:    nitroGLYCERIN (NITROSTAT) 0.4 MG SL tablet, Place 1 tablet (0.4 mg total) under the tongue every 5 (five) minutes as needed for chest pain., Disp: 25 tablet, Rfl: 3   nystatin-triamcinolone ointment (MYCOLOG), Apply 1 application topically 2 (two) times daily., Disp: 30 g, Rfl: 0   ONETOUCH ULTRA test strip, CHECK BLOOD SUGAR ONCE A DAY Dx E11.69, Disp: 100 strip, Rfl: 4   pantoprazole (PROTONIX) 40 MG tablet, Take 1 tablet (40 mg total) by mouth daily., Disp: 90 tablet, Rfl: 1   pravastatin (PRAVACHOL) 40 MG tablet, Take 1 tablet (40 mg total) by mouth daily., Disp: 90 tablet, Rfl: 3   sodium chloride (OCEAN) 0.65 % SOLN nasal spray, Place 1 spray into both nostrils as needed for congestion., Disp: 30 mL, Rfl: 0   Vaginal Lubricant (REPLENS) GEL, Place 1 application vaginally daily., Disp: 35 g, Rfl: 3 Social History   Socioeconomic History   Marital status: Widowed    Spouse name: Not on file   Number of children: 5   Years of education: 5   Highest education level: Not on file  Occupational History   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    Comment: Retired  Tobacco Use   Smoking status: Never   Smokeless tobacco: Never  Scientific laboratory technician Use: Never used  Substance and Sexual Activity   Alcohol use: No   Drug use: No   Sexual activity: Not Currently  Other Topics Concern   Not on file  Social History Narrative   Pt lives at home alone   Husband w/ Alzheimer's passed away   Right-handed   Drinks 1-2 cups of coffee daily   Social Determinants of Health   Financial Resource Strain: Not on file  Food Insecurity: Not on file  Transportation Needs: Not on file  Physical Activity: Not on file  Stress: Not on file  Social Connections: Not on file  Intimate Partner Violence: Not on file   Family History  Problem Relation Age of Onset   Pneumonia Mother    Brain cancer Father    Tremor Father    Arthritis Sister    COPD Sister     Emphysema Brother    COPD Brother    Tremor Brother    Lung cancer Brother    Heart disease Brother    Liver cancer Brother    Diabetes Other         1 child has diabetes at age 25 and the other 4 are healthy    Objective: Office vital signs reviewed. BP 133/75    Pulse 64    Temp 97.6 F (36.4 C)    Ht 5\' 4"  (1.626 Debra)    Wt 151 lb (68.5 kg)    SpO2 98%    BMI 25.92 kg/Debra   Physical Examination:  General: Awake, alert, well nourished,  No acute distress HEENT: Clear white. Cardio: Irregularly irregular with rate controlled.  S1S2 heard, + murmurs appreciated Pulm: clear to auscultation bilaterally, no wheezes, rhonchi or rales; normal work of breathing on room air Neuro: see DM foot  Diabetic Foot Exam - Simple   Simple Foot Form Diabetic Foot exam was performed with the following findings: Yes 09/23/2021  1:29 PM  Visual Inspection No deformities, no ulcerations, no other skin breakdown bilaterally: Yes Sensation Testing Intact to touch and monofilament testing bilaterally: Yes Pulse Check Posterior Tibialis and Dorsalis pulse intact bilaterally: Yes Comments    Lab Results  Component Value Date   HGBA1C 7.3 (H) 09/23/2021    Assessment/ Plan: 88 y.o. female   Type 2 diabetes mellitus with hyperlipidemia ([place]) - Plan: Bayer DCA Hb A1c Waived  Hypertension associated with diabetes ([place])  Vaginal itching - Plan: Urinalysis, Routine w reflex microscopic  Sugar remains controlled for her advanced age.  No changes.  Not due for fasting lipid.  Continue statin  Blood pressure is controlled.  No changes.  Having some vaginal irritation and this may be secondary to [place].  May need to consider discontinuing this medicine and switching off to something else.  Her urinalysis however did not show any evidence of yeast or urinary tract infection.  Orders Placed This Encounter  Procedures   Bayer DCA Hb A1c Waived   No orders of the defined types were placed in this  encounter.    [name], DO [place] [phone]

## 2021-09-28 ENCOUNTER — Other Ambulatory Visit: Payer: Self-pay | Admitting: Family Medicine

## 2021-09-28 DIAGNOSIS — L03116 Cellulitis of left lower limb: Secondary | ICD-10-CM

## 2021-10-19 ENCOUNTER — Other Ambulatory Visit: Payer: Self-pay | Admitting: Family Medicine

## 2021-10-19 DIAGNOSIS — E1159 Type 2 diabetes mellitus with other circulatory complications: Secondary | ICD-10-CM

## 2021-10-19 DIAGNOSIS — I152 Hypertension secondary to endocrine disorders: Secondary | ICD-10-CM

## 2021-10-19 DIAGNOSIS — E1169 Type 2 diabetes mellitus with other specified complication: Secondary | ICD-10-CM

## 2021-11-11 ENCOUNTER — Ambulatory Visit (INDEPENDENT_AMBULATORY_CARE_PROVIDER_SITE_OTHER): Payer: Medicare Other

## 2021-11-11 VITALS — Wt 151.0 lb

## 2021-11-11 DIAGNOSIS — Z0000 Encounter for general adult medical examination without abnormal findings: Secondary | ICD-10-CM | POA: Diagnosis not present

## 2021-11-11 NOTE — Progress Notes (Signed)
? ?Subjective:  ? Debra Phillips is a 88 y.o. female who presents for Medicare Annual (Subsequent) preventive examination. ? ?Virtual Visit via Telephone Note ? ?I connected with  Debra Phillips on 11/11/21 at  2:45 PM EDT by telephone and verified that I am speaking with the correct person using two identifiers. ? ?Location: ?Patient: Home ?Provider: [facility] ?Persons participating in the virtual visit: patient, daughter, Mariann Laster, and Nurse Health Advisor ?  ?I discussed the limitations, risks, security and privacy concerns of performing an evaluation and management service by telephone and the availability of in person appointments. The patient expressed understanding and agreed to proceed. ? ?Interactive audio and video telecommunications were attempted between this nurse and patient, however failed, due to patient having technical difficulties OR patient did not have access to video capability.  We continued and completed visit with audio only. ? ?Some vital signs may be absent or patient reported.  ? ?Schylar Wuebker Dionne Ano, LPN  ? ?Review of Systems    ? ?Cardiac Risk Factors include: advanced age (>79mn, >>63women);diabetes mellitus;dyslipidemia;sedentary lifestyle;hypertension;Other (see comment), Risk factor comments: atherosclerosis ? ?   ?Objective:  ?  ?Today's Vitals  ? 11/11/21 1441  ?Weight: 151 lb (68.5 kg)  ? ?Body mass index is 25.92 kg/m?. ? ?Advanced Directives 11/11/2021 11/08/2020 10/27/2019 04/11/2019 12/22/2017 08/29/2016 08/21/2016  ?Does Patient Have a Medical Advance Directive? No No No No No No No  ?Would patient like information on creating a medical advance directive? No - Patient declined No - Patient declined No - Patient declined No - Patient declined No - Patient declined No - Patient declined;Yes (ED - Information included in AVS) No - Patient declined;Yes (ED - Information included in AVS)  ?Pre-existing out of facility DNR order (yellow form or pink MOST form) - - - - - - -  ? ? ?Current  Medications (verified) ?Outpatient Encounter Medications as of 11/11/2021  ?Medication Sig  ? aspirin EC 81 MG tablet Take 81 mg by mouth daily.  ? Cholecalciferol (VITAMIN D3) 10 MCG (400 UNIT) CAPS Take by mouth.  ? diltiazem (CARDIZEM CD) 180 MG 24 hr capsule TAKE (1) CAPSULE DAILY  ? estradiol (ESTRACE) 0.1 MG/GM vaginal cream APPLY A PEA SIZE AMOUNT PER VAGINA 2-3 NIGHTS A WEEK -FOR VAGINAL USE-  ? FARXIGA 5 MG TABS tablet TAKE 1 TABLET DAILY BEFORE BREAKFAST  ? furosemide (LASIX) 20 MG tablet TAKE 1 TABLET DAILY  ? hydroxypropyl methylcellulose / hypromellose (ISOPTO TEARS / GONIOVISC) 2.5 % ophthalmic solution Place 1 drop into both eyes 3 (three) times daily as needed for dry eyes.  ? Lancets (ONETOUCH DELICA PLUS [id number] MISC CHECK BLOOD SUGAR ONCE A DAY Dx E11.69  ? lisinopril (ZESTRIL) 40 MG tablet TAKE 1 TABLET DAILY  ? metFORMIN (GLUCOPHAGE) 1000 MG tablet TAKE (1) TABLET TWICE A DAY WITH MEALS (BREAKFAST AND SUPPER)  ? multivitamin-lutein (OCUVITE-LUTEIN) CAPS capsule Take 1 capsule by mouth daily.  ? Omega-3 Fatty Acids (FISH OIL) 1200 MG CAPS Take by mouth.  ? pantoprazole (PROTONIX) 40 MG tablet Take 1 tablet (40 mg total) by mouth daily.  ? pravastatin (PRAVACHOL) 40 MG tablet Take 1 tablet (40 mg total) by mouth daily.  ? Vaginal Lubricant (REPLENS) GEL Place 1 application vaginally daily.  ? Artificial Saliva (BIOTENE DRY MOUTH MOISTURIZING) SOLN Use as directed 5 mLs in the mouth or throat at bedtime.  ? calcium carbonate (OS-CAL) 600 MG TABS tablet Take 600 mg by mouth 2 (two) times daily with a  meal.  ? MYRBETRIQ 25 MG TB24 tablet Take 25 mg by mouth daily.  ? nitroGLYCERIN (NITROSTAT) 0.4 MG SL tablet Place 1 tablet (0.4 mg total) under the tongue every 5 (five) minutes as needed for chest pain.  ? nystatin-triamcinolone ointment (MYCOLOG) Apply 1 application topically 2 (two) times daily.  ? ONETOUCH ULTRA test strip CHECK BLOOD SUGAR ONCE A DAY Dx E11.69  ? sodium chloride (OCEAN) 0.65 %  SOLN nasal spray Place 1 spray into both nostrils as needed for congestion.  ? [DISCONTINUED] metFORMIN (GLUCOPHAGE) 500 MG tablet Take 500 mg by mouth 3 (three) times daily.  ? ?No facility-administered encounter medications on file as of 11/11/2021.  ? ? ?Allergies (verified) ?Codeine, Doxycycline, Other, Sulfonamide derivatives, and Vancomycin  ? ?History: ?Past Medical History:  ?Diagnosis Date  ? Abnormality of gait 01/29/2016  ? Aortic root dilatation (HCC)   ? Mild  ? Cataract   ? Diabetes mellitus without complication ([place])   ? type 2  ? Dyslipidemia   ? Dyspnea   ? with exertion  ? Dysrhythmia   ? hx of occ palpitations  ? Esophageal stricture   ? s/p dilation  ? GERD (gastroesophageal reflux disease)   ? Headache   ? History of hiatal hernia   ? Hyperlipidemia   ? Hypertension   ? Macular pucker, left eye   ? Osteoarthritis   ? Osteoporosis   ? Tinnitus   ? left ear last week or so  ? ?Past Surgical History:  ?Procedure Laterality Date  ? ABDOMINAL HYSTERECTOMY    ? partial  ? APPENDECTOMY    ? BACK SURGERY    ? lower x 1  ? CARDIAC CATHETERIZATION  2010  ? both cataracts  ? CHOLECYSTECTOMY  1993  ? CYSTOSCOPY N/A 08/28/2016  ? Procedure: CYSTOSCOPY;  Surgeon: Carolan Clines, MD;  Location: [facility];  Service: Urology;  Laterality: N/A;  ? EYE SURGERY    ? cataracts  ? EYE SURGERY    ? for macular pucker  ? FRACTURE SURGERY    ? rt wrist2010  ? HARDWARE REMOVAL  12/10/2011  ? Procedure: HARDWARE REMOVAL;  Surgeon: Schuyler Amor, MD;  Location: [facility];  Service: Orthopedics;  Laterality: Right;  radial head  ? KNEE ARTHROSCOPY  2004  ? Left and right  ? KYPHOSIS SURGERY    ? LUMBAR FUSION  2000,2003  ? RADIAL HEAD ARTHROPLASTY  5/12  ? orif rt radial head  ? SHOULDER SURGERY    ? Right rotator cuff epair  ? TOTAL KNEE ARTHROPLASTY    ? Left  ? VAGINAL PROLAPSE REPAIR N/A 08/28/2016  ? Procedure: ANTERIOR VAGINAL VAULT SUSPENSION WITH SACROSPINOUS FIXATION;  Surgeon: Carolan Clines, MD;   Location: [facility];  Service: Urology;  Laterality: N/A;  ? ?Family History  ?Problem Relation Age of Onset  ? Pneumonia Mother   ? Brain cancer Father   ? Tremor Father   ? Arthritis Sister   ? COPD Sister   ? Emphysema Brother   ? COPD Brother   ? Tremor Brother   ? Lung cancer Brother   ? Heart disease Brother   ? Liver cancer Brother   ? Diabetes Other   ?      1 child has diabetes at age 65 and the other 42 are healthy  ? ?Social History  ? ?Socioeconomic History  ? Marital status: Widowed  ?  Spouse name: Not on file  ? Number of children: 5  ?  Years of education: 5  ? Highest education level: Not on file  ?Occupational History  ? 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  Comment: Retired  ?Tobacco Use  ? Smoking status: Never  ? Smokeless tobacco: Never  ?Vaping Use  ? Vaping Use: Never used  ?Substance and Sexual Activity  ? Alcohol use: No  ? Drug use: No  ? Sexual activity: Not Currently  ?Other Topics Concern  ? Not on file  ?Social History Narrative  ? Pt lives at home alone  ? Husband w/ Alzheimer's passed away  ? Right-handed  ? Drinks 1-2 cups of coffee daily  ? ?Social Determinants of Health  ? ?Financial Resource Strain: Low Risk   ? Difficulty of Paying Living Expenses: Not very hard  ?Food Insecurity: No Food Insecurity  ? Worried About Charity fundraiser in the Last Year: Never true  ? Ran Out of Food in the Last Year: Never true  ?Transportation Needs: No Transportation Needs  ? Lack of Transportation (Medical): No  ? Lack of Transportation (Non-Medical): No  ?Physical Activity: Insufficiently Active  ? Days of Exercise per Week: 7 days  ? Minutes of Exercise per Session: 10 min  ?Stress: No Stress Concern Present  ? Feeling of Stress : Only a little  ?Social Connections: Moderately Integrated  ? Frequency of Communication with Friends and Family: More than three times a week  ? Frequency of Social Gatherings with Friends and Family: More than three times a week  ? Attends  Religious Services: More than 4 times per year  ? Active Member of Clubs or Organizations: Yes  ? Attends Archivist Meetings: More than 4 times per year  ? Marital Status: Widowed  ? ? ?Tobacco Counsel

## 2021-11-11 NOTE — Patient Instructions (Signed)
Ms. Russett , ?Thank you for taking time to come for your Medicare Wellness Visit. I appreciate your ongoing commitment to your health goals. Please review the following plan we discussed and let me know if I can assist you in the future.  ? ?Screening recommendations/referrals: ?Colonoscopy: Done 01/07/2011 - no repeat required ?Mammogram: Done 2019 - no repeat required ?Bone Density: Done 06/13/2016 - may repeat every 2 years, but not required ?Recommended yearly ophthalmology/optometry visit for glaucoma screening and checkup ?Recommended yearly dental visit for hygiene and checkup ? ?Vaccinations: ?Influenza vaccine: Done 05/24/2021 - Repeat annually ?Pneumococcal vaccine: Done 06/24/2013 & 06/17/1996 ?Tdap vaccine: Done 02/17/2017 - Repeat in 10 years ?Shingles vaccine: Zostavax done 03/17/2014 - New vaccine due: Shingrix is 2 doses 2-6 months apart and over 90% effective     ?Covid-19: Done 10/10/2019 & 11/21/2019 ? ?Advanced directives: Advance directive discussed with you today. Even though you declined this today, please call our office should you change your mind, and we can give you the proper paperwork for you to fill out.  ? ?Conditions/risks identified: Do stretches and seated exercises, chair exercises, try to walk using your cane several times per day. Practice fall prevention. ? ? ?Next appointment: Follow up in one year for your annual wellness visit  ? ? ?Preventive Care 25 Years and Older, Female ?Preventive care refers to lifestyle choices and visits with your health care provider that can promote health and wellness. ?What does preventive care include? ?A yearly physical exam. This is also called an annual well check. ?Dental exams once or twice a year. ?Routine eye exams. Ask your health care provider how often you should have your eyes checked. ?Personal lifestyle choices, including: ?Daily care of your teeth and gums. ?Regular physical activity. ?Eating a healthy diet. ?Avoiding tobacco and drug  use. ?Limiting alcohol use. ?Practicing safe sex. ?Taking low-dose aspirin every day. ?Taking vitamin and mineral supplements as recommended by your health care provider. ?What happens during an annual well check? ?The services and screenings done by your health care provider during your annual well check will depend on your age, overall health, lifestyle risk factors, and family history of disease. ?Counseling  ?Your health care provider may ask you questions about your: ?Alcohol use. ?Tobacco use. ?Drug use. ?Emotional well-being. ?Home and relationship well-being. ?Sexual activity. ?Eating habits. ?History of falls. ?Memory and ability to understand (cognition). ?Work and work Statistician. ?Reproductive health. ?Screening  ?You may have the following tests or measurements: ?Height, weight, and BMI. ?Blood pressure. ?Lipid and cholesterol levels. These may be checked every 5 years, or more frequently if you are over 22 years old. ?Skin check. ?Lung cancer screening. You may have this screening every year starting at age 14 if you have a 30-pack-year history of smoking and currently smoke or have quit within the past 15 years. ?Fecal occult blood test (FOBT) of the stool. You may have this test every year starting at age 38. ?Flexible sigmoidoscopy or colonoscopy. You may have a sigmoidoscopy every 5 years or a colonoscopy every 10 years starting at age 58. ?Hepatitis C blood test. ?Hepatitis B blood test. ?Sexually transmitted disease (STD) testing. ?Diabetes screening. This is done by checking your blood sugar (glucose) after you have not eaten for a while (fasting). You may have this done every 1-3 years. ?Bone density scan. This is done to screen for osteoporosis. You may have this done starting at age 65. ?Mammogram. This may be done every 1-2 years. Talk to your health  care provider about how often you should have regular mammograms. ?Talk with your health care provider about your test results, treatment  options, and if necessary, the need for more tests. ?Vaccines  ?Your health care provider may recommend certain vaccines, such as: ?Influenza vaccine. This is recommended every year. ?Tetanus, diphtheria, and acellular pertussis (Tdap, Td) vaccine. You may need a Td booster every 10 years. ?Zoster vaccine. You may need this after age 66. ?Pneumococcal 13-valent conjugate (PCV13) vaccine. One dose is recommended after age 71. ?Pneumococcal polysaccharide (PPSV23) vaccine. One dose is recommended after age 30. ?Talk to your health care provider about which screenings and vaccines you need and how often you need them. ?This information is not intended to replace advice given to you by your health care provider. Make sure you discuss any questions you have with your health care provider. ?Document Released: 09/07/2015 Document Revised: 04/30/2016 Document Reviewed: 06/12/2015 ?Elsevier Interactive Patient Education ? 2017 [place]. ? ?Fall Prevention in the Home ?Falls can cause injuries. They can happen to people of all ages. There are many things you can do to make your home safe and to help prevent falls. ?What can I do on the outside of my home? ?Regularly fix the edges of walkways and driveways and fix any cracks. ?Remove anything that might make you trip as you walk through a door, such as a raised step or threshold. ?Trim any bushes or trees on the path to your home. ?Use bright outdoor lighting. ?Clear any walking paths of anything that might make someone trip, such as rocks or tools. ?Regularly check to see if handrails are loose or broken. Make sure that both sides of any steps have handrails. ?Any raised decks and porches should have guardrails on the edges. ?Have any leaves, snow, or ice cleared regularly. ?Use sand or salt on walking paths during winter. ?Clean up any spills in your garage right away. This includes oil or grease spills. ?What can I do in the bathroom? ?Use night lights. ?Install grab  bars by the toilet and in the tub and shower. Do not use towel bars as grab bars. ?Use non-skid mats or decals in the tub or shower. ?If you need to sit down in the shower, use a plastic, non-slip stool. ?Keep the floor dry. Clean up any water that spills on the floor as soon as it happens. ?Remove soap buildup in the tub or shower regularly. ?Attach bath mats securely with double-sided non-slip rug tape. ?Do not have throw rugs and other things on the floor that can make you trip. ?What can I do in the bedroom? ?Use night lights. ?Make sure that you have a light by your bed that is easy to reach. ?Do not use any sheets or blankets that are too big for your bed. They should not hang down onto the floor. ?Have a firm chair that has side arms. You can use this for support while you get dressed. ?Do not have throw rugs and other things on the floor that can make you trip. ?What can I do in the kitchen? ?Clean up any spills right away. ?Avoid walking on wet floors. ?Keep items that you use a lot in easy-to-reach places. ?If you need to reach something above you, use a strong step stool that has a grab bar. ?Keep electrical cords out of the way. ?Do not use floor polish or wax that makes floors slippery. If you must use wax, use non-skid floor wax. ?Do not have throw  rugs and other things on the floor that can make you trip. ?What can I do with my stairs? ?Do not leave any items on the stairs. ?Make sure that there are handrails on both sides of the stairs and use them. Fix handrails that are broken or loose. Make sure that handrails are as long as the stairways. ?Check any carpeting to make sure that it is firmly attached to the stairs. Fix any carpet that is loose or worn. ?Avoid having throw rugs at the top or bottom of the stairs. If you do have throw rugs, attach them to the floor with carpet tape. ?Make sure that you have a light switch at the top of the stairs and the bottom of the stairs. If you do not have them,  ask someone to add them for you. ?What else can I do to help prevent falls? ?Wear shoes that: ?Do not have high heels. ?Have rubber bottoms. ?Are comfortable and fit you well. ?Are closed at the toe. Do no

## 2021-11-28 ENCOUNTER — Encounter: Payer: Self-pay | Admitting: Family Medicine

## 2021-11-28 ENCOUNTER — Ambulatory Visit (INDEPENDENT_AMBULATORY_CARE_PROVIDER_SITE_OTHER): Payer: Medicare Other | Admitting: Family Medicine

## 2021-11-28 ENCOUNTER — Telehealth: Payer: Self-pay | Admitting: Family Medicine

## 2021-11-28 VITALS — BP 161/91 | HR 84 | Temp 97.9°F | Ht 64.0 in | Wt 154.5 lb

## 2021-11-28 DIAGNOSIS — H539 Unspecified visual disturbance: Secondary | ICD-10-CM | POA: Diagnosis not present

## 2021-11-28 DIAGNOSIS — R519 Headache, unspecified: Secondary | ICD-10-CM | POA: Diagnosis not present

## 2021-11-28 DIAGNOSIS — B372 Candidiasis of skin and nail: Secondary | ICD-10-CM

## 2021-11-28 DIAGNOSIS — R531 Weakness: Secondary | ICD-10-CM | POA: Diagnosis not present

## 2021-11-28 DIAGNOSIS — S0990XA Unspecified injury of head, initial encounter: Secondary | ICD-10-CM | POA: Diagnosis not present

## 2021-11-28 DIAGNOSIS — W19XXXA Unspecified fall, initial encounter: Secondary | ICD-10-CM

## 2021-11-28 DIAGNOSIS — L22 Diaper dermatitis: Secondary | ICD-10-CM

## 2021-11-28 DIAGNOSIS — R350 Frequency of micturition: Secondary | ICD-10-CM

## 2021-11-28 DIAGNOSIS — R300 Dysuria: Secondary | ICD-10-CM | POA: Diagnosis not present

## 2021-11-28 DIAGNOSIS — R42 Dizziness and giddiness: Secondary | ICD-10-CM | POA: Diagnosis not present

## 2021-11-28 LAB — URINALYSIS, ROUTINE W REFLEX MICROSCOPIC
Bilirubin, UA: NEGATIVE
Ketones, UA: NEGATIVE
Nitrite, UA: NEGATIVE
Protein,UA: NEGATIVE
Specific Gravity, UA: <1.005 — ABNORMAL LOW (ref 1.005–1.030)
Urobilinogen, Ur: 0.2 mg/dL (ref 0.2–1.0)
pH, UA: 6 (ref 5.0–7.5)

## 2021-11-28 LAB — MICROSCOPIC EXAMINATION: Renal Epithel, UA: NONE SEEN /hpf

## 2021-11-28 LAB — HEMOGLOBIN, FINGERSTICK: Hemoglobin: 13.2 g/dL (ref 11.1–15.9)

## 2021-11-28 MED ORDER — AMOXICILLIN 875 MG PO TABS: 875.0000 mg | ORAL_TABLET | Freq: Two times a day (BID) | ORAL | 0 refills | Status: AC

## 2021-11-28 MED ORDER — NYSTATIN 100000 UNIT/GM EX CREA: 1.0000 "application " | TOPICAL_CREAM | Freq: Two times a day (BID) | CUTANEOUS | 0 refills | Status: DC

## 2021-11-28 NOTE — Progress Notes (Signed)
? ?Acute Office Visit ? ?Subjective:  ? ? Patient ID: Debra Phillips, female    DOB: Mar 15, 1929, 88 y.o.   MRN: [id number] ? ?Chief Complaint  ?Patient presents with  ? Dysuria  ? Urinary Frequency  ? ? ?HPI ?? Historian due to confusion. She reports that she fell either a few days ago or a week ago. She isn't sure if she hit her head or not. She has been having a headache off and on since and has felt more dizzy than usual and more tired than usual. She also thinks her vision may be a little worse than usual. She also feels a little short of breath and off balance, making it hard for her to walk.  ? ?Per telephone call with daughter Debra Phillips, patient did fall last week. Debra Phillips states that its possible that [facility] has fallen again and did not tell her family. Debra Phillips also reports that [facility] has been more tired than usual and has been sleeping in. She also skipped church last night which is highly unusual for her. She also reports that [facility] never complains about a headache.  ? ?Her other daughter was here in the car during the beginning of the visit. She was asked to come in to help provide a history and determine a baseline for [facility]. She feels like Tashari is at her baseline and does not feel like she is more confused or sleepy than usual.  ? ?Patient reports for dysuria and urinary frequency. She is unable to give a time frame for the length of symptoms. Also reports vulva irritation and itching. She is incontinent and wears urinary pads. She has been using vaseline. She denies fever, chills, or hematuria.  ? ?Denies cough, congestion, chest pain, changes in speech, or focal weakness. Denies edema.  ? ?Past Medical History:  ?Diagnosis Date  ? Abnormality of gait 01/29/2016  ? Aortic root dilatation (HCC)   ? Mild  ? Cataract   ? Diabetes mellitus without complication ([place])   ? type 2  ? Dyslipidemia   ? Dyspnea   ? with exertion  ? Dysrhythmia   ? hx of occ palpitations  ? Esophageal stricture   ? s/p dilation  ?  GERD (gastroesophageal reflux disease)   ? Headache   ? History of hiatal hernia   ? Hyperlipidemia   ? Hypertension   ? Macular pucker, left eye   ? Osteoarthritis   ? Osteoporosis   ? Tinnitus   ? left ear last week or so  ? ? ?Past Surgical History:  ?Procedure Laterality Date  ? ABDOMINAL HYSTERECTOMY    ? partial  ? APPENDECTOMY    ? BACK SURGERY    ? lower x 1  ? CARDIAC CATHETERIZATION  2010  ? both cataracts  ? CHOLECYSTECTOMY  1993  ? CYSTOSCOPY N/A 08/28/2016  ? Procedure: CYSTOSCOPY;  Surgeon: Carolan Clines, MD;  Location: [facility];  Service: Urology;  Laterality: N/A;  ? EYE SURGERY    ? cataracts  ? EYE SURGERY    ? for macular pucker  ? FRACTURE SURGERY    ? rt wrist2010  ? HARDWARE REMOVAL  12/10/2011  ? Procedure: HARDWARE REMOVAL;  Surgeon: Schuyler Amor, MD;  Location: [place];  Service: Orthopedics;  Laterality: Right;  radial head  ? KNEE ARTHROSCOPY  2004  ? Left and right  ? KYPHOSIS SURGERY    ? LUMBAR FUSION  2000,2003  ? RADIAL HEAD ARTHROPLASTY  5/12  ?  orif rt radial head  ? SHOULDER SURGERY    ? Right rotator cuff epair  ? TOTAL KNEE ARTHROPLASTY    ? Left  ? VAGINAL PROLAPSE REPAIR N/A 08/28/2016  ? Procedure: ANTERIOR VAGINAL VAULT SUSPENSION WITH SACROSPINOUS FIXATION;  Surgeon: Carolan Clines, MD;  Location: [facility];  Service: Urology;  Laterality: N/A;  ? ? ?Family History  ?Problem Relation Age of Onset  ? Pneumonia Mother   ? Brain cancer Father   ? Tremor Father   ? Arthritis Sister   ? COPD Sister   ? Emphysema Brother   ? COPD Brother   ? Tremor Brother   ? Lung cancer Brother   ? Heart disease Brother   ? Liver cancer Brother   ? Diabetes Other   ?      1 child has diabetes at age 6 and the other 1 are healthy  ? ? ?Social History  ? ?Socioeconomic History  ? Marital status: Widowed  ?  Spouse name: Not on file  ? Number of children: 5  ? Years of education: 5  ? Highest education level: Not on file  ?Occupational History  ? 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  Comment: Retired  ?Tobacco Use  ? Smoking status: Never  ? Smokeless tobacco: Never  ?Vaping Use  ? Vaping Use: Never used  ?Substance and Sexual Activity  ? Alcohol use: No  ? Drug use: No  ? Sexual activity: Not Currently  ?Other Topics Concern  ? Not on file  ?Social History Narrative  ? Pt lives at home alone  ? Husband w/ Alzheimer's passed away  ? Right-handed  ? Drinks 1-2 cups of coffee daily  ? ?Social Determinants of Health  ? ?Financial Resource Strain: Low Risk   ? Difficulty of Paying Living Expenses: Not very hard  ?Food Insecurity: No Food Insecurity  ? Worried About Charity fundraiser in the Last Year: Never true  ? Ran Out of Food in the Last Year: Never true  ?Transportation Needs: No Transportation Needs  ? Lack of Transportation (Medical): No  ? Lack of Transportation (Non-Medical): No  ?Physical Activity: Insufficiently Active  ? Days of Exercise per Week: 7 days  ? Minutes of Exercise per Session: 10 min  ?Stress: No Stress Concern Present  ? Feeling of Stress : Only a little  ?Social Connections: Moderately Integrated  ? Frequency of Communication with Friends and Family: More than three times a week  ? Frequency of Social Gatherings with Friends and Family: More than three times a week  ? Attends Religious Services: More than 4 times per year  ? Active Member of Clubs or Organizations: Yes  ? Attends Archivist Meetings: More than 4 times per year  ? Marital Status: Widowed  ?Intimate Partner Violence: Not At Risk  ? Fear of Current or Ex-Partner: No  ? Emotionally Abused: No  ? Physically Abused: No  ? Sexually Abused: No  ? ? ?Outpatient Medications Prior to Visit  ?Medication Sig Dispense Refill  ? Artificial Saliva (BIOTENE DRY MOUTH MOISTURIZING) SOLN Use as directed 5 mLs in the mouth or throat at bedtime. 44.3 mL 1  ? aspirin EC 81 MG tablet Take 81 mg by mouth daily.    ? calcium carbonate (OS-CAL) 600 MG TABS tablet Take 600 mg by mouth 2  (two) times daily with a meal.    ? Cholecalciferol (VITAMIN D3) 10 MCG (400 UNIT) CAPS  Take by mouth.    ? diltiazem (CARDIZEM CD) 180 MG 24 hr capsule TAKE (1) CAPSULE DAILY 90 capsule 0  ? estradiol (ESTRACE) 0.1 MG/GM vaginal cream APPLY A PEA SIZE AMOUNT PER VAGINA 2-3 NIGHTS A WEEK -FOR VAGINAL USE- 42.5 g 6  ? FARXIGA 5 MG TABS tablet TAKE 1 TABLET DAILY BEFORE BREAKFAST 90 tablet 1  ? furosemide (LASIX) 20 MG tablet TAKE 1 TABLET DAILY 30 tablet 5  ? hydroxypropyl methylcellulose / hypromellose (ISOPTO TEARS / GONIOVISC) 2.5 % ophthalmic solution Place 1 drop into both eyes 3 (three) times daily as needed for dry eyes.    ? Lancets (ONETOUCH DELICA PLUS OHYWVP71G) MISC CHECK BLOOD SUGAR ONCE A DAY Dx E11.69 100 each 4  ? lisinopril (ZESTRIL) 40 MG tablet TAKE 1 TABLET DAILY 90 tablet 1  ? metFORMIN (GLUCOPHAGE) 1000 MG tablet TAKE (1) TABLET TWICE A DAY WITH MEALS (BREAKFAST AND SUPPER) 180 tablet 0  ? multivitamin-lutein (OCUVITE-LUTEIN) CAPS capsule Take 1 capsule by mouth daily.    ? MYRBETRIQ 25 MG TB24 tablet Take 25 mg by mouth daily.    ? nitroGLYCERIN (NITROSTAT) 0.4 MG SL tablet Place 1 tablet (0.4 mg total) under the tongue every 5 (five) minutes as needed for chest pain. 25 tablet 3  ? nystatin-triamcinolone ointment (MYCOLOG) Apply 1 application topically 2 (two) times daily. 30 g 0  ? Omega-3 Fatty Acids (FISH OIL) 1200 MG CAPS Take by mouth.    ? ONETOUCH ULTRA test strip CHECK BLOOD SUGAR ONCE A DAY Dx E11.69 100 strip 4  ? pantoprazole (PROTONIX) 40 MG tablet Take 1 tablet (40 mg total) by mouth daily. 90 tablet 1  ? pravastatin (PRAVACHOL) 40 MG tablet Take 1 tablet (40 mg total) by mouth daily. 90 tablet 1  ? sodium chloride (OCEAN) 0.65 % SOLN nasal spray Place 1 spray into both nostrils as needed for congestion. 30 mL 0  ? Vaginal Lubricant (REPLENS) GEL Place 1 application vaginally daily. 35 g 3  ? ?No facility-administered medications prior to visit.  ? ? ?Allergies  ?Allergen  Reactions  ? Codeine Nausea And Vomiting  ? Doxycycline Hives  ? Other Nausea Only  ?  Almost all antibiotics  ? Sulfonamide Derivatives Hives  ? Vancomycin Itching  ? ? ?Review of Systems ?As per HPI.  ?   ?Object

## 2021-11-28 NOTE — Patient Instructions (Signed)
Dizziness Dizziness is a common problem. It is a feeling of unsteadiness or light-headedness. You may feel like you are about to faint. Dizziness can lead to injury if you stumble or fall. Anyone can become dizzy, but dizziness is more common in older adults. This condition can be caused by a number of things, including medicines, dehydration, or illness. Follow these instructions at home: Eating and drinking  Drink enough fluid to keep your urine pale yellow. This helps to keep you from becoming dehydrated. Try to drink more clear fluids, such as water. Do not drink alcohol. Limit your caffeine intake if told to do so by your health care provider. Check ingredients and nutrition facts to see if a food or beverage contains caffeine. Limit your salt (sodium) intake if told to do so by your health care provider. Check ingredients and nutrition facts to see if a food or beverage contains sodium. Activity  Avoid making quick movements. Rise slowly from chairs and steady yourself until you feel okay. In the morning, first sit up on the side of the bed. When you feel okay, stand slowly while you hold onto something until you know that your balance is good. If you need to stand in one place for a long time, move your legs often. Tighten and relax the muscles in your legs while you are standing. Do not drive or use machinery if you feel dizzy. Avoid bending down if you feel dizzy. Place items in your home so that they are easy for you to reach without leaning over. Lifestyle Do not use any products that contain nicotine or tobacco. These products include cigarettes, chewing tobacco, and vaping devices, such as e-cigarettes. If you need help quitting, ask your health care provider. Try to reduce your stress level by using methods such as yoga or meditation. Talk with your health care provider if you need help to manage your stress. General instructions Watch your dizziness for any changes. Take  over-the-counter and prescription medicines only as told by your health care provider. Talk with your health care provider if you think that your dizziness is caused by a medicine that you are taking. Tell a friend or a family member that you are feeling dizzy. If he or she notices any changes in your behavior, have this person call your health care provider. Keep all follow-up visits. This is important. Contact a health care provider if: Your dizziness does not go away or you have new symptoms. Your dizziness or light-headedness gets worse. You feel nauseous. You have reduced hearing. You have a fever. You have neck pain or a stiff neck. Your dizziness leads to an injury or a fall. Get help right away if: You vomit or have diarrhea and are unable to eat or drink anything. You have problems talking, walking, swallowing, or using your arms, hands, or legs. You feel generally weak. You have any bleeding. You are not thinking clearly or you have trouble forming sentences. It may take a friend or family member to notice this. You have chest pain, abdominal pain, shortness of breath, or sweating. Your vision changes or you develop a severe headache. These symptoms may represent a serious problem that is an emergency. Do not wait to see if the symptoms will go away. Get medical help right away. Call your local emergency services (911 in the [place] Do not drive yourself to the hospital. Summary Dizziness is a feeling of unsteadiness or light-headedness. This condition can be caused by a number of   things, including medicines, dehydration, or illness. Anyone can become dizzy, but dizziness is more common in older adults. Drink enough fluid to keep your urine pale yellow. Do not drink alcohol. Avoid making quick movements if you feel dizzy. Monitor your dizziness for any changes. This information is not intended to replace advice given to you by your health care provider. Make sure you discuss any  questions you have with your health care provider. Document Revised: 07/16/2020 Document Reviewed: 07/16/2020 Elsevier Patient Education  2022 Elsevier Inc.  

## 2021-11-28 NOTE — Telephone Encounter (Signed)
Returned daughters call and she states that someone already called her back and she no longer needed anything.  ?

## 2021-11-29 ENCOUNTER — Other Ambulatory Visit (HOSPITAL_COMMUNITY): Payer: Medicare Other

## 2021-11-29 LAB — BMP8+EGFR
BUN/Creatinine Ratio: 20 (ref 12–28)
BUN: 17 mg/dL (ref 10–36)
CO2: 22 mmol/L (ref 20–29)
Calcium: 9.8 mg/dL (ref 8.7–10.3)
Chloride: 102 mmol/L (ref 96–106)
Creatinine, Ser: 0.85 mg/dL (ref 0.57–1.00)
Glucose: 246 mg/dL — ABNORMAL HIGH (ref 70–99)
Potassium: 4.9 mmol/L (ref 3.5–5.2)
Sodium: 141 mmol/L (ref 134–144)
eGFR: 65 mL/min/{1.73_m2} (ref >59)

## 2021-11-29 LAB — CBC WITH DIFFERENTIAL/PLATELET
Basophils Absolute: 0.1 10*3/uL (ref 0.0–0.2)
Basos: 1 %
EOS (ABSOLUTE): 0.1 10*3/uL (ref 0.0–0.4)
Eos: 1 %
Hematocrit: 38.7 % (ref 34.0–46.6)
Hemoglobin: 12.9 g/dL (ref 11.1–15.9)
Immature Grans (Abs): 0 10*3/uL (ref 0.0–0.1)
Immature Granulocytes: 0 %
Lymphocytes Absolute: 2.4 10*3/uL (ref 0.7–3.1)
Lymphs: 26 %
MCH: 30.4 pg (ref 26.6–33.0)
MCHC: 33.3 g/dL (ref 31.5–35.7)
MCV: 91 fL (ref 79–97)
Monocytes Absolute: 0.7 10*3/uL (ref 0.1–0.9)
Monocytes: 8 %
Neutrophils Absolute: 6.1 10*3/uL (ref 1.4–7.0)
Neutrophils: 64 %
Platelets: 308 10*3/uL (ref 150–450)
RBC: 4.24 x10E6/uL (ref 3.77–5.28)
RDW: 12.8 % (ref 11.7–15.4)
WBC: 9.3 10*3/uL (ref 3.4–10.8)

## 2021-12-02 ENCOUNTER — Telehealth: Payer: Self-pay | Admitting: Family Medicine

## 2021-12-02 ENCOUNTER — Other Ambulatory Visit: Payer: Self-pay

## 2021-12-02 DIAGNOSIS — R300 Dysuria: Secondary | ICD-10-CM

## 2021-12-02 NOTE — Telephone Encounter (Signed)
Thank you. Please up date the family that we are awaiting the results.  ?

## 2021-12-02 NOTE — Telephone Encounter (Signed)
Patient had STAT MRI Brain on 11/28/21 at [facility] ?Daughter is calling to find out results of test. ?Please call daughter, Secundino Ginger. ?Thank you. ?

## 2021-12-02 NOTE — Telephone Encounter (Signed)
Daughter aware and verbalizes understanding. 

## 2021-12-02 NOTE — Telephone Encounter (Signed)
Spoke with [facility] and they transferred me to medical records.  ?Spoke with medical records and they said we had to fax over a record request to [phone] for them to look into. ?RR faxed ?States if results not done that they will fax [place] back letting us know that as well.  ?

## 2021-12-02 NOTE — Telephone Encounter (Signed)
I do not have results for the MRI and have tried to find them in care everywhere but do not see anything.  ?

## 2021-12-04 LAB — URINE CULTURE

## 2021-12-11 ENCOUNTER — Telehealth: Payer: Self-pay | Admitting: Family Medicine

## 2021-12-11 NOTE — Telephone Encounter (Signed)
Patient scheduled for appointment.

## 2021-12-11 NOTE — Telephone Encounter (Signed)
Patient will need to be re-evaluated to recheck urine.  ?

## 2021-12-11 NOTE — Telephone Encounter (Signed)
Pt is stating she thinks she still has uti because she is going very frequently and is still having some pain when going.  ?

## 2021-12-12 ENCOUNTER — Encounter: Payer: Self-pay | Admitting: Nurse Practitioner

## 2021-12-12 ENCOUNTER — Ambulatory Visit (INDEPENDENT_AMBULATORY_CARE_PROVIDER_SITE_OTHER): Payer: Medicare Other | Admitting: Nurse Practitioner

## 2021-12-12 VITALS — BP 139/79 | HR 77 | Temp 97.0°F | Resp 20 | Ht 64.0 in | Wt 152.0 lb

## 2021-12-12 DIAGNOSIS — M79641 Pain in right hand: Secondary | ICD-10-CM | POA: Diagnosis not present

## 2021-12-12 DIAGNOSIS — R300 Dysuria: Secondary | ICD-10-CM | POA: Diagnosis not present

## 2021-12-12 LAB — MICROSCOPIC EXAMINATION
Bacteria, UA: NONE SEEN
Epithelial Cells (non renal): NONE SEEN /hpf (ref 0–10)
RBC, Urine: NONE SEEN /hpf (ref 0–2)
Renal Epithel, UA: NONE SEEN /hpf
WBC, UA: NONE SEEN /hpf (ref 0–5)

## 2021-12-12 LAB — URINALYSIS, COMPLETE
Bilirubin, UA: NEGATIVE
Ketones, UA: NEGATIVE
Leukocytes,UA: NEGATIVE
Nitrite, UA: NEGATIVE
Protein,UA: NEGATIVE
RBC, UA: NEGATIVE
Specific Gravity, UA: <1.005 — ABNORMAL LOW (ref 1.005–1.030)
Urobilinogen, Ur: 0.2 mg/dL (ref 0.2–1.0)
pH, UA: 5 (ref 5.0–7.5)

## 2021-12-12 NOTE — Progress Notes (Signed)
? ?  Subjective:  ? ? Patient ID: Debra Phillips, female    DOB: Dec 30, 1928, 88 y.o.   MRN: [id number] ? ? ?Chief Complaint: Hand Pain (Right hand ) and Dysuria ? ? ?Hand Pain  ? ?Dysuria  ?Associated symptoms include frequency and urgency.  ?Patient come sin with 2 complaints: ?- hand pain- says that she woke u this morning with her right hand hurting. Rates pain 2/10. Only hurts when she pushes on it. Hurst to grip anything. She did some yard work yesterday which might be the cause of her pain. Denies injury ?- having dysuria with urgency and frequency.started over a month ago. Urine is dark. Says she drinks water all day. ? ? ? ?Review of Systems  ?Genitourinary:  Positive for frequency and urgency. Negative for dysuria and pelvic pain.  ?Musculoskeletal:  Positive for arthralgias (right hand pain).  ? ?   ?Objective:  ? Physical Exam ?Vitals reviewed.  ?Constitutional:   ?   Appearance: Normal appearance.  ?Cardiovascular:  ?   Rate and Rhythm: Normal rate and regular rhythm.  ?   Heart sounds: Normal heart sounds.  ?Pulmonary:  ?   Effort: Pulmonary effort is normal.  ?   Breath sounds: Normal breath sounds.  ?Skin: ?   General: Skin is warm.  ?Neurological:  ?   General: No focal deficit present.  ?   Mental Status: She is alert and oriented to person, place, and time.  ?Psychiatric:     ?   Mood and Affect: Mood normal.     ?   Behavior: Behavior normal.  ? ?BP 139/79   Pulse 77   Temp (!) 97 ?F (36.1 ?C) (Temporal)   Resp 20   Ht '5\' 4"'$  (1.626 m)   Wt 152 lb (68.9 kg)   SpO2 98%   BMI 26.09 kg/m?  ?Urine clear ? ? ? ?   ?Assessment & Plan:  ? ?Debra Phillips in today with chief complaint of Hand Pain (Right hand ) and Dysuria ? ? ?1. Dysuria ?Force fluids ?- Urinalysis, Complete ?- Urine Culture ? ?2. Right hand pain ?Extra strength tylenol ?Soak hand in warm epsom salt ?RTOprn ? ? ? ?The above assessment and management plan was discussed with the patient. The patient verbalized understanding of and  has agreed to the management plan. Patient is aware to call the clinic if symptoms persist or worsen. Patient is aware when to return to the clinic for a follow-up visit. Patient educated on when it is appropriate to go to the emergency department.  ? ?Mary-Margaret Hassell Done, FNP ? ? ?

## 2021-12-12 NOTE — Patient Instructions (Signed)
Hand Pain Many things can cause hand pain. Some common causes are: An injury. Repeating the same movement with your hand over and over (overuse). Osteoporosis. Arthritis. Lumps in the tendons or joints of the hand and wrist (ganglion cysts). Nerve compression syndromes (carpal tunnel syndrome). Inflammation of the tendons (tendinitis). Infection. Follow these instructions at home: Pay attention to any changes in your symptoms. Take these actions to help with your discomfort: Managing pain, stiffness, and swelling  Take over-the-counter and prescription medicines only as told by your health care provider. Wear a hand splint or support as told by your health care provider. If directed, put ice on the affected area: Put ice in a plastic bag. Place a towel between your skin and the bag. Leave the ice on for 20 minutes, 2-3 times a day. Activity Take breaks from repetitive activity often. Avoid activities that make your pain worse. Minimize stress on your hands and wrists as much as possible. Do stretches or exercises as told by your health care provider. Do not do activities that make your pain worse. Contact a health care provider if: Your pain does not get better after a few days of self-care. Your pain gets worse. Your pain affects your ability to do your daily activities. Get help right away if: Your hand becomes warm, red, or swollen. Your hand is numb or tingling. Your hand is extremely swollen or deformed. Your hand or fingers turn white or blue. You cannot move your hand, wrist, or fingers. Summary Many things can cause hand pain. Contact your health care provider if your pain does not get better after a few days of self care. Minimize stress on your hands and wrists as much as possible. Do not do activities that make your pain worse. This information is not intended to replace advice given to you by your health care provider. Make sure you discuss any questions you have  with your health care provider. Document Revised: 11/29/2020 Document Reviewed: 11/29/2020 Elsevier Patient Education  2023 Elsevier Inc.  

## 2021-12-13 ENCOUNTER — Telehealth: Payer: Self-pay | Admitting: Family Medicine

## 2021-12-13 NOTE — Telephone Encounter (Addendum)
Patient daughter aware that our office discussed the results with patient. Daughter has asked [place] to take out patients phone number and to only call her. I have updated patients phone number to her daughter Debra Phillips. Debra Phillips was made aware of results as well.  ?

## 2021-12-13 NOTE — Telephone Encounter (Signed)
Pts daughter called requesting to make a complaint. ? ?States that pt had an MRI done weeks ago and never heard back from anyone here at the office to get those results. ? ?Daughter said she finally saw the results on pts Mychart, but if it wasn't for that, she still wouldn't have the results.  ?

## 2021-12-16 LAB — URINE CULTURE

## 2021-12-16 MED ORDER — CEPHALEXIN 500 MG PO CAPS: 500.0000 mg | ORAL_CAPSULE | Freq: Two times a day (BID) | ORAL | 0 refills | Status: DC

## 2021-12-16 NOTE — Addendum Note (Signed)
Addended by: Chevis Pretty on: 12/16/2021 01:35 PM ? ? Modules accepted: Orders ? ?

## 2021-12-17 ENCOUNTER — Telehealth: Payer: Self-pay | Admitting: Family Medicine

## 2021-12-17 ENCOUNTER — Other Ambulatory Visit: Payer: Self-pay

## 2021-12-17 NOTE — Telephone Encounter (Signed)
Already been taken care of

## 2021-12-25 DIAGNOSIS — Z20822 Contact with and (suspected) exposure to covid-19: Secondary | ICD-10-CM | POA: Diagnosis not present

## 2022-01-17 ENCOUNTER — Telehealth: Payer: Self-pay | Admitting: Family Medicine

## 2022-01-17 ENCOUNTER — Ambulatory Visit (INDEPENDENT_AMBULATORY_CARE_PROVIDER_SITE_OTHER): Payer: Medicare Other | Admitting: Family Medicine

## 2022-01-17 DIAGNOSIS — B379 Candidiasis, unspecified: Secondary | ICD-10-CM | POA: Diagnosis not present

## 2022-01-17 DIAGNOSIS — R300 Dysuria: Secondary | ICD-10-CM

## 2022-01-17 LAB — URINALYSIS, ROUTINE W REFLEX MICROSCOPIC
Bilirubin, UA: NEGATIVE
Ketones, UA: NEGATIVE
Nitrite, UA: NEGATIVE
Protein,UA: NEGATIVE
Specific Gravity, UA: 1.01 (ref 1.005–1.030)
Urobilinogen, Ur: 0.2 mg/dL (ref 0.2–1.0)
pH, UA: 5.5 (ref 5.0–7.5)

## 2022-01-17 LAB — MICROSCOPIC EXAMINATION: Renal Epithel, UA: NONE SEEN /hpf

## 2022-01-17 MED ORDER — FLUCONAZOLE 150 MG PO TABS: 150.0000 mg | ORAL_TABLET | Freq: Once | ORAL | 0 refills | Status: AC

## 2022-01-17 MED ORDER — NYSTATIN 100000 UNIT/GM EX CREA: 1.0000 "application " | TOPICAL_CREAM | Freq: Two times a day (BID) | CUTANEOUS | 0 refills | Status: DC

## 2022-01-17 NOTE — Telephone Encounter (Signed)
  Incoming Patient Call  01/17/2022  What symptoms do you have? Has a yeast infection and needs diflucan called in. Told daughter she probably needs appt since it's been a month.  How long have you been sick? Week  Have you been seen for this problem? In the past  If your provider decides to give you a prescription, which pharmacy would you like for it to be sent to? [facility].   Patient informed that this information will be sent to the clinical staff for review and that they should receive a follow up call.

## 2022-01-17 NOTE — Telephone Encounter (Signed)
Appt made for tele today 220pm

## 2022-01-17 NOTE — Progress Notes (Unsigned)
   Virtual Visit  Note Due to COVID-19 pandemic this visit was conducted virtually. This visit type was conducted due to national recommendations for restrictions regarding the COVID-19 Pandemic (e.g. social distancing, sheltering in place) in an effort to limit this patient's exposure and mitigate transmission in our community. All issues noted in this document were discussed and addressed.  A physical exam was not performed with this format.  I connected with Debra Phillips on 01/17/22 at 1425 by telephone and verified that I am speaking with the correct person using two identifiers. Debra Phillips is currently located at home and no one is currently with her during the visit. The provider, Gwenlyn Perking, FNP is located in their office at time of visit.  I discussed the limitations, risks, security and privacy concerns of performing an evaluation and management service by telephone and the availability of in person appointments. I also discussed with the patient that there may be a patient responsible charge related to this service. The patient expressed understanding and agreed to proceed.  CC: dysuria  History and Present Illness:  HPI Debra Phillips reports dysuria, frequency, and urgency x 1 week. She denies fever, chills, flank pain, or abdominal pain. She is feeling nauseous. Denies vomiting. She reports her perinum is red, raw, and irritated as well. She wears incontinence pads and tries to stay dry. She uses vaseline as a barrier cream. She has a history of UTI with most recent positive culture on 12/12/21.     ROS As per HPI.   Observations/Objective: Alert and oriented x 3. Able to speak in full sentences without difficulty.   Assessment and Plan: ***  Follow Up Instructions: ***    I discussed the assessment and treatment plan with the patient. The patient was provided an opportunity to ask questions and all were answered. The patient agreed with the plan and demonstrated  an understanding of the instructions.   The patient was advised to call back or seek an in-person evaluation if the symptoms worsen or if the condition fails to improve as anticipated.  The above assessment and management plan was discussed with the patient. The patient verbalized understanding of and has agreed to the management plan. Patient is aware to call the clinic if symptoms persist or worsen. Patient is aware when to return to the clinic for a follow-up visit. Patient educated on when it is appropriate to go to the emergency department.   Time call ended:    I provided *** minutes of  non face-to-face time during this encounter.    Gwenlyn Perking, FNP

## 2022-01-20 LAB — URINE CULTURE

## 2022-01-21 ENCOUNTER — Encounter: Payer: Self-pay | Admitting: Family Medicine

## 2022-01-21 ENCOUNTER — Other Ambulatory Visit: Payer: Self-pay | Admitting: Family Medicine

## 2022-01-21 DIAGNOSIS — N3000 Acute cystitis without hematuria: Secondary | ICD-10-CM

## 2022-01-21 MED ORDER — AMOXICILLIN 875 MG PO TABS: 875.0000 mg | ORAL_TABLET | Freq: Two times a day (BID) | ORAL | 0 refills | Status: AC

## 2022-01-22 ENCOUNTER — Other Ambulatory Visit: Payer: Self-pay | Admitting: *Deleted

## 2022-01-22 ENCOUNTER — Ambulatory Visit: Payer: Medicare Other | Admitting: Family Medicine

## 2022-01-22 DIAGNOSIS — N390 Urinary tract infection, site not specified: Secondary | ICD-10-CM

## 2022-01-24 ENCOUNTER — Ambulatory Visit (INDEPENDENT_AMBULATORY_CARE_PROVIDER_SITE_OTHER): Payer: Medicare Other | Admitting: Family Medicine

## 2022-01-24 ENCOUNTER — Encounter: Payer: Self-pay | Admitting: Family Medicine

## 2022-01-24 VITALS — BP 112/78 | HR 79 | Temp 98.4°F | Ht 64.0 in | Wt 153.0 lb

## 2022-01-24 DIAGNOSIS — M47816 Spondylosis without myelopathy or radiculopathy, lumbar region: Secondary | ICD-10-CM

## 2022-01-24 DIAGNOSIS — G3184 Mild cognitive impairment, so stated: Secondary | ICD-10-CM | POA: Diagnosis not present

## 2022-01-24 NOTE — Progress Notes (Signed)
   Acute Office Visit  Subjective:     Patient ID: Debra Phillips, female    DOB: 1929/01/25, 88 y.o.   MRN: [id number]  Chief Complaint  Patient presents with   Back Pain    Back Pain  Patient is in today for lower back pain. She is here alone today. She has a history of chronic lower back pain. Sometimes her hips hurt as well. She denies recent falls or injury. Denies fever or chills. She is currently on amoxicillin for a UTI. Denies changes in bowel or bladder control, numbness, tinging, or saddle anesthesia. She has not been taking anything for her pain. She uses a cane for ambulation. She has tylenol arthritis at home but doesn't like to take it much.   Review of Systems  Musculoskeletal:  Positive for back pain.  As per HPI.      Objective:    BP 112/78   Pulse 79   Temp 98.4 F (36.9 C) (Temporal)   Ht '5\' 4"'$  (1.626 m)   Wt 153 lb (69.4 kg)   SpO2 97%   BMI 26.26 kg/m    Physical Exam Vitals and nursing note reviewed.  Constitutional:      General: She is not in acute distress.    Appearance: She is not ill-appearing, toxic-appearing or diaphoretic.  HENT:     Head: Normocephalic and atraumatic.  Cardiovascular:     Rate and Rhythm: Normal rate and regular rhythm.     Heart sounds: Normal heart sounds. No murmur heard. Pulmonary:     Effort: Pulmonary effort is normal. No respiratory distress.     Breath sounds: Normal breath sounds.  Musculoskeletal:        General: No swelling.     Lumbar back: Tenderness (paraspinal) present. No swelling, edema, deformity, signs of trauma or bony tenderness. Normal range of motion.     Right lower leg: No edema.     Left lower leg: No edema.  Skin:    General: Skin is warm and dry.  Neurological:     Mental Status: She is alert and oriented to person, place, and time.     Gait: Gait abnormal (cane).  Psychiatric:        Mood and Affect: Mood normal.        Speech: Speech normal.        Behavior: Behavior normal.         Cognition and Memory: Memory is impaired.    No results found for any visits on 01/24/22.      Assessment & Plan:   Mirha was seen today for back pain.  Diagnoses and all orders for this visit:  Spondylosis of lumbar region without myelopathy or radiculopathy Mild cognitive impairment Chronic, pain not well controlled. No new injury or falls. No red flags today. Discussed tylenol, heat, ice, lidocaine patches. Poor historian due to impaired memory.   Keep follow up appt with PCP.   The patient indicates understanding of these issues and agrees with the plan.   Gwenlyn Perking, FNP

## 2022-01-24 NOTE — Patient Instructions (Signed)
Take Tylenol Arthritis twice a day as needed for pain.

## 2022-02-04 ENCOUNTER — Ambulatory Visit (INDEPENDENT_AMBULATORY_CARE_PROVIDER_SITE_OTHER): Payer: Medicare Other | Admitting: Family Medicine

## 2022-02-04 ENCOUNTER — Encounter: Payer: Self-pay | Admitting: Family Medicine

## 2022-02-04 VITALS — BP 156/85 | HR 84 | Temp 97.7°F | Ht 64.0 in | Wt 152.6 lb

## 2022-02-04 DIAGNOSIS — R682 Dry mouth, unspecified: Secondary | ICD-10-CM

## 2022-02-04 DIAGNOSIS — I152 Hypertension secondary to endocrine disorders: Secondary | ICD-10-CM | POA: Diagnosis not present

## 2022-02-04 DIAGNOSIS — E1169 Type 2 diabetes mellitus with other specified complication: Secondary | ICD-10-CM

## 2022-02-04 DIAGNOSIS — E785 Hyperlipidemia, unspecified: Secondary | ICD-10-CM

## 2022-02-04 DIAGNOSIS — E1159 Type 2 diabetes mellitus with other circulatory complications: Secondary | ICD-10-CM

## 2022-02-04 DIAGNOSIS — F03B0 Unspecified dementia, moderate, without behavioral disturbance, psychotic disturbance, mood disturbance, and anxiety: Secondary | ICD-10-CM

## 2022-02-04 LAB — BAYER DCA HB A1C WAIVED: HB A1C (BAYER DCA - WAIVED): 8 % — ABNORMAL HIGH (ref 4.8–5.6)

## 2022-02-04 MED ORDER — MYRBETRIQ 25 MG PO TB24: 25.0000 mg | ORAL_TABLET | Freq: Every day | ORAL | 3 refills | Status: DC

## 2022-02-04 MED ORDER — BIOTENE DRY MOUTH MOISTURIZING MT SOLN: 5.0000 mL | Freq: Every evening | OROMUCOSAL | 1 refills | Status: AC

## 2022-02-04 MED ORDER — PRAVASTATIN SODIUM 40 MG PO TABS: 40.0000 mg | ORAL_TABLET | Freq: Every day | ORAL | 1 refills | Status: DC

## 2022-02-04 MED ORDER — PANTOPRAZOLE SODIUM 40 MG PO TBEC: 40.0000 mg | DELAYED_RELEASE_TABLET | Freq: Every day | ORAL | 1 refills | Status: DC

## 2022-02-04 MED ORDER — SITAGLIPTIN PHOSPHATE 50 MG PO TABS: 50.0000 mg | ORAL_TABLET | Freq: Every day | ORAL | 0 refills | Status: DC

## 2022-02-04 MED ORDER — DILTIAZEM HCL ER COATED BEADS 180 MG PO CP24: ORAL_CAPSULE | ORAL | 3 refills | Status: DC

## 2022-02-04 MED ORDER — DONEPEZIL HCL 5 MG PO TABS: 5.0000 mg | ORAL_TABLET | Freq: Every day | ORAL | 0 refills | Status: DC

## 2022-02-04 MED ORDER — LISINOPRIL 40 MG PO TABS: 40.0000 mg | ORAL_TABLET | Freq: Every day | ORAL | 3 refills | Status: DC

## 2022-02-04 NOTE — Patient Instructions (Signed)
Sugar is too high.  Goal A1c <7.9 STOP Farxiga since you are getting recurrent UTIs START Januvia '50mg'$  daily.  See Debra Phillips here in a few weeks to review sugar log.  She may be able to get the new sugar med Phillips for you.  Management of Memory Problems  There are some general things you can do to help manage your memory problems.  Your memory may not in fact recover, but by using techniques and strategies you will be able to manage your memory difficulties better.  1)  Establish a routine. Try to establish and then stick to a regular routine.  By doing this, you will get used to what to expect and you will reduce the need to rely on your memory.  Also, try to do things at the same time of day, such as taking your medication or checking your calendar first thing in the morning. Think about think that you can do as a part of a regular routine and make a list.  Then enter them into a daily planner to remind you.  This will help you establish a routine.  2)  Organize your environment. Organize your environment so that it is uncluttered.  Decrease visual stimulation.  Place everyday items such as keys or cell phone in the same place every day (ie.  Basket next to front door) Use post it notes with a brief message to yourself (ie. Turn off light, lock the door) Use labels to indicate where things go (ie. Which cupboards are for food, dishes, etc.) Keep a notepad and pen by the telephone to take messages  3)  Memory Aids A diary or journal/notebook/daily planner Making a list (shopping list, chore list, to do list that needs to be done) Using an alarm as a reminder (kitchen timer or cell phone alarm) Using cell phone to store information (Notes, Calendar, Reminders) Calendar/White board placed in a prominent position Post-it notes  In order for memory aids to be useful, you need to have good habits.  It's no good remembering to make a note in your journal if you don't remember to look in it.  Try  setting aside a certain time of day to look in journal.  4)  Improving mood and managing fatigue. There may be other factors that contribute to memory difficulties.  Factors, such as anxiety, depression and tiredness can affect memory. Regular gentle exercise can help improve your mood and give you more energy. Simple relaxation techniques may help relieve symptoms of anxiety Try to get back to completing activities or hobbies you enjoyed doing in the past. Learn to pace yourself through activities to decrease fatigue. Find out about some local support groups where you can share experiences with others. Try and achieve 7-8 hours of sleep at night.

## 2022-02-04 NOTE — Progress Notes (Signed)
Subjective: CC:DM PCP: [name], DO JQZ:[id number] Debra Phillips is a 88 y.o. female presenting to clinic today for:  1. Type 2 Diabetes with hypertension, hyperlipidemia:  She is accompanied today by her relative.  She has been using [place] as directed but is had 3 UTIs since January.  She has been having some difficulty remembering what medication she is supposed to take and has subsequently been out of her diltiazem and Myrbetriq.  She supposed be seeing her urologist soon.  She is taking her metformin but I am told that sometimes she only takes the 1000 mg in the morning and takes of 500 mg tablet at nighttime.  Her blood sugar subsequently have been kind of elevated in the morning.  She believes that she is compliant with the Pravachol, the lisinopril.  Last eye exam: UTD Last foot exam: UTD Last A1c:  Lab Results  Component Value Date   HGBA1C 7.3 (H) 09/23/2021   Nephropathy screen indicated?: UTD Last flu, zoster and/or pneumovax:  Immunization History  Administered Date(s) Administered   Fluad Quad(high Dose 65+) 07/20/2019, 05/24/2021   Hepatitis B 07/02/1998, 08/06/1998, 01/07/1999   Influenza Inj Mdck Quad With Preservative 07/13/2020   Influenza Split 05/31/2010, 06/11/2011, 06/06/2013, 07/10/2021   Influenza, High Dose Seasonal PF 06/13/2016, 06/18/2017, 06/29/2018   Influenza, Seasonal, Injecte, Preservative Fre 06/29/2009, 06/04/2012   Influenza,inj,Quad PF,6+ Mos 06/15/2015   Influenza-Unspecified 06/17/1996, 06/14/1997, 06/12/1998, 05/31/1999, 06/15/2001, 06/09/2002, 06/07/2003, 04/25/2013, 05/10/2014, 06/13/2016, 06/18/2017, 06/29/2018, 07/13/2020   Moderna Sars-Covid-2 Vaccination 10/10/2019, 11/21/2019   Pneumococcal Conjugate-13 08/24/2013   Pneumococcal Polysaccharide-23 06/17/1996   Td 02/17/2017   Tdap 02/17/2017   Zoster, Live 03/17/2014    ROS: No chest pain or shortness of breath reported.  ROS: Per HPI  Allergies  Allergen Reactions    Codeine Nausea And Vomiting   Doxycycline Hives   Other Nausea Only    Almost all antibiotics   Sulfonamide Derivatives Hives   Vancomycin Itching   Past Medical History:  Diagnosis Date   Abnormality of gait 01/29/2016   Aortic root dilatation (HCC)    Mild   Cataract    Diabetes mellitus without complication (HCC)    type 2   Dyslipidemia    Dyspnea    with exertion   Dysrhythmia    hx of occ palpitations   Esophageal stricture    s/p dilation   GERD (gastroesophageal reflux disease)    Headache    History of hiatal hernia    Hyperlipidemia    Hypertension    Macular pucker, left eye    Osteoarthritis    Osteoporosis    Tinnitus    left ear last week or so    Current Outpatient Medications:    Artificial Saliva (BIOTENE DRY MOUTH MOISTURIZING) SOLN, Use as directed 5 mLs in the mouth or throat at bedtime., Disp: 44.3 mL, Rfl: 1   aspirin EC 81 MG tablet, Take 81 mg by mouth daily., Disp: , Rfl:    Cholecalciferol (VITAMIN D3) 10 MCG (400 UNIT) CAPS, Take by mouth., Disp: , Rfl:    diltiazem (CARDIZEM CD) 180 MG 24 hr capsule, TAKE (1) CAPSULE DAILY, Disp: 90 capsule, Rfl: 0   estradiol (ESTRACE) 0.1 MG/GM vaginal cream, APPLY A PEA SIZE AMOUNT PER VAGINA 2-3 NIGHTS A WEEK -FOR VAGINAL USE-, Disp: 42.5 g, Rfl: 6   FARXIGA 5 MG TABS tablet, TAKE 1 TABLET DAILY BEFORE BREAKFAST, Disp: 90 tablet, Rfl: 1   Lancets (ONETOUCH DELICA PLUS [id number]) MISC, CHECK  BLOOD SUGAR ONCE A DAY Dx E11.69, Disp: 100 each, Rfl: 4   lisinopril (ZESTRIL) 40 MG tablet, TAKE 1 TABLET DAILY, Disp: 90 tablet, Rfl: 1   metFORMIN (GLUCOPHAGE) 1000 MG tablet, TAKE (1) TABLET TWICE A DAY WITH MEALS (BREAKFAST AND SUPPER), Disp: 180 tablet, Rfl: 0   multivitamin-lutein (OCUVITE-LUTEIN) CAPS capsule, Take 1 capsule by mouth daily., Disp: , Rfl:    MYRBETRIQ 25 MG TB24 tablet, Take 25 mg by mouth daily., Disp: , Rfl:    nitroGLYCERIN (NITROSTAT) 0.4 MG SL tablet, Place 1 tablet (0.4 mg total) under the  tongue every 5 (five) minutes as needed for chest pain., Disp: 25 tablet, Rfl: 3   nystatin cream (MYCOSTATIN), Apply 1 application. topically 2 (two) times daily., Disp: 30 g, Rfl: 0   Omega-3 Fatty Acids (FISH OIL) 1200 MG CAPS, Take by mouth., Disp: , Rfl:    ONETOUCH ULTRA test strip, CHECK BLOOD SUGAR ONCE A DAY Dx E11.69, Disp: 100 strip, Rfl: 4   pantoprazole (PROTONIX) 40 MG tablet, Take 1 tablet (40 mg total) by mouth daily., Disp: 90 tablet, Rfl: 1   pravastatin (PRAVACHOL) 40 MG tablet, Take 1 tablet (40 mg total) by mouth daily., Disp: 90 tablet, Rfl: 1   sodium chloride (OCEAN) 0.65 % SOLN nasal spray, Place 1 spray into both nostrils as needed for congestion., Disp: 30 mL, Rfl: 0   Vaginal Lubricant (REPLENS) GEL, Place 1 application vaginally daily., Disp: 35 g, Rfl: 3 Social History   Socioeconomic History   Marital status: Widowed    Spouse name: Not on file   Number of children: 5   Years of education: 5   Highest education level: Not on file  Occupational History   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    Comment: Retired  Tobacco Use   Smoking status: Never   Smokeless tobacco: Never  Scientific laboratory technician Use: Never used  Substance and Sexual Activity   Alcohol use: No   Drug use: No   Sexual activity: Not Currently  Other Topics Concern   Not on file  Social History Narrative   Pt lives at home alone   Husband w/ Alzheimer's passed away   Right-handed   Drinks 1-2 cups of coffee daily   Social Determinants of Health   Financial Resource Strain: Low Risk  (11/11/2021)   Overall Financial Resource Strain (CARDIA)    Difficulty of Paying Living Expenses: Not very hard  Food Insecurity: No Food Insecurity (11/11/2021)   Hunger Vital Sign    Worried About Running Out of Food in the Last Year: Never true    [place] in the Last Year: Never true  Transportation Needs: No Transportation Needs (11/11/2021)   PRAPARE - Armed forces logistics/support/administrative officer (Medical): No    Lack of Transportation (Non-Medical): No  Physical Activity: Insufficiently Active (11/11/2021)   Exercise Vital Sign    Days of Exercise per Week: 7 days    Minutes of Exercise per Session: 10 min  Stress: No Stress Concern Present (11/11/2021)   [place]    Feeling of Stress : Only a little  Social Connections: Moderately Integrated (11/11/2021)   Social Connection and Isolation Panel [NHANES]    Frequency of Communication with Friends and Family: More than three times a week    Frequency of Social Gatherings with Friends and Family: More than three times a week  Attends Religious Services: More than 4 times per year    Active Member of Clubs or Organizations: Yes    Attends Archivist Meetings: More than 4 times per year    Marital Status: Widowed  Intimate Partner Violence: Not At Risk (11/11/2021)   Humiliation, Afraid, Rape, and Kick questionnaire    Fear of Current or Ex-Partner: No    Emotionally Abused: No    Physically Abused: No    Sexually Abused: No   Family History  Problem Relation Age of Onset   Pneumonia Mother    Brain cancer Father    Tremor Father    Arthritis Sister    COPD Sister    Emphysema Brother    COPD Brother    Tremor Brother    Lung cancer Brother    Heart disease Brother    Liver cancer Brother    Diabetes Other         1 child has diabetes at age 32 and the other 4 are healthy    Objective: Office vital signs reviewed. BP (!) 156/85   Pulse 84   Temp 97.7 F (36.5 C)   Ht '5\' 4"'  (1.626 Debra)   Wt 152 lb 9.6 oz (69.2 kg)   SpO2 97%   BMI 26.19 kg/Debra   Physical Examination:  General: Awake, alert, well nourished, No acute distress HEENT: Sclera white.  Wears glasses Cardio: Irregularly irregular with rate controlled.  S1S2 heard, no murmurs appreciated Pulm: clear to auscultation bilaterally, no wheezes, rhonchi or  rales; normal work of breathing on room air MSK: Ambulating with assistance.  Gait is slow     02/04/2022    5:00 PM 02/15/2018    4:23 PM 06/15/2015   12:40 PM  MMSE - Mini Mental State Exam  Orientation to time '2 5 4  ' Orientation to Place '2 5 5  ' Registration '2 3 3  ' Attention/ Calculation '4 4 5  ' Recall 0 1 2  Language- name 2 objects '2 2 2  ' Language- repeat '1 1 1  ' Language- follow 3 step command '3 3 3  ' Language- read & follow direction '1 1 1  ' Write a sentence '1 1 1  ' Copy design '1 1 1  ' Total score '19 27 28    ' Assessment/ Plan: 88 y.o. female   Type 2 diabetes mellitus with hyperlipidemia ([place]) - Plan: Bayer DCA Hb A1c Waived, CMP14+EGFR, LDL Cholesterol, Direct, TSH, AMB Referral to [facility], pravastatin (PRAVACHOL) 40 MG tablet, pantoprazole (PROTONIX) 40 MG tablet, sitaGLIPtin (JANUVIA) 50 MG tablet  Hypertension associated with diabetes ([place]) - Plan: CMP14+EGFR, diltiazem (CARDIZEM CD) 180 MG 24 hr capsule, lisinopril (ZESTRIL) 40 MG tablet  Dry mouth - Plan: Artificial Saliva (BIOTENE DRY MOUTH MOISTURIZING) SOLN  Moderate dementia without behavioral disturbance, psychotic disturbance, mood disturbance, or anxiety, unspecified dementia type ([place]) - Plan: donepezil (ARICEPT) 5 MG tablet  Sugar now uncontrolled with A1c rising to 8.0.  I am going to discontinue her [place] given recurrent urinary tract infections.  I think this medication is too high risk in this patient at this point.  I am going to place her on Januvia instead and have her follow-up with Almy[facility] in the next couple of weeks for sugar review.  Of asked that she maintain a blood sugar log of at least fasting blood sugars and evening blood sugars.  A log was provided to the patient.  She will continue metformin 1000 mg twice daily.  Blood pressure was not  controlled today but it sounds like she has lapsed in her Cardizem so I renewed this.  We will recheck this at her follow-up with CCM  She  certainly has evidence of moderate dementia at this point.  She has had quite a drop since her 2019 screening test and she is agreeable to starting Aricept.  Would like to reconvene with her in the next couple of months to reevaluate her cognition and ensure that blood sugars are remaining under little bit better control  Orders Placed This Encounter  Procedures   Bayer [place] Hb A1c Waived   CMP14+EGFR   LDL Cholesterol, Direct   TSH   No orders of the defined types were placed in this encounter.    [name], DO [place] [phone]

## 2022-02-05 LAB — CMP14+EGFR
ALT: 17 IU/L (ref 0–32)
AST: 21 IU/L (ref 0–40)
Albumin/Globulin Ratio: 2 (ref 1.2–2.2)
Albumin: 4.3 g/dL (ref 3.5–4.6)
Alkaline Phosphatase: 71 IU/L (ref 44–121)
BUN/Creatinine Ratio: 15 (ref 12–28)
BUN: 13 mg/dL (ref 10–36)
Bilirubin Total: 0.4 mg/dL (ref 0.0–1.2)
CO2: 23 mmol/L (ref 20–29)
Calcium: 9.6 mg/dL (ref 8.7–10.3)
Chloride: 101 mmol/L (ref 96–106)
Creatinine, Ser: 0.88 mg/dL (ref 0.57–1.00)
Globulin, Total: 2.1 g/dL (ref 1.5–4.5)
Glucose: 170 mg/dL — ABNORMAL HIGH (ref 70–99)
Potassium: 3.8 mmol/L (ref 3.5–5.2)
Sodium: 141 mmol/L (ref 134–144)
Total Protein: 6.4 g/dL (ref 6.0–8.5)
eGFR: 62 mL/min/{1.73_m2} (ref >59)

## 2022-02-05 LAB — LDL CHOLESTEROL, DIRECT: LDL Direct: 85 mg/dL (ref 0–99)

## 2022-02-05 LAB — TSH: TSH: 1.58 u[IU]/mL (ref 0.450–4.500)

## 2022-02-10 DIAGNOSIS — R0789 Other chest pain: Secondary | ICD-10-CM | POA: Diagnosis not present

## 2022-02-10 DIAGNOSIS — J0100 Acute maxillary sinusitis, unspecified: Secondary | ICD-10-CM | POA: Diagnosis not present

## 2022-02-10 DIAGNOSIS — M549 Dorsalgia, unspecified: Secondary | ICD-10-CM | POA: Diagnosis not present

## 2022-02-10 DIAGNOSIS — R051 Acute cough: Secondary | ICD-10-CM | POA: Diagnosis not present

## 2022-02-20 DIAGNOSIS — R0609 Other forms of dyspnea: Secondary | ICD-10-CM | POA: Diagnosis not present

## 2022-02-20 DIAGNOSIS — R0602 Shortness of breath: Secondary | ICD-10-CM | POA: Diagnosis not present

## 2022-02-20 DIAGNOSIS — L03116 Cellulitis of left lower limb: Secondary | ICD-10-CM | POA: Diagnosis not present

## 2022-02-20 DIAGNOSIS — M7989 Other specified soft tissue disorders: Secondary | ICD-10-CM | POA: Diagnosis not present

## 2022-02-20 DIAGNOSIS — R051 Acute cough: Secondary | ICD-10-CM | POA: Diagnosis not present

## 2022-02-24 ENCOUNTER — Other Ambulatory Visit: Payer: Self-pay | Admitting: Family Medicine

## 2022-02-27 ENCOUNTER — Ambulatory Visit: Payer: Medicare Other | Admitting: Pharmacist

## 2022-03-05 ENCOUNTER — Ambulatory Visit: Payer: Self-pay | Admitting: *Deleted

## 2022-03-05 NOTE — Chronic Care Management (AMB) (Signed)
  Chronic Care Management   Note  03/05/2022 Name: Debra Phillips MRN: [id number] DOB: 20-Mar-1929   Patient is stable from [facility] Management perspective or has not recently engaged with the [facility]. I am removing RN Care Manager from Care Team and closing [place]. If patient is currently engaged with another CCM team member I will forward this encounter to inform them of my case closure. Patient may be eligible for re-engagement with RN Care Manager in the future if necessary and can discuss this with their PCP.  Chong Sicilian, BSN, RN-BC Embedded Chronic Care Manager [facility] / [facility] Management Direct Dial: [phone]

## 2022-03-07 ENCOUNTER — Other Ambulatory Visit: Payer: Self-pay | Admitting: *Deleted

## 2022-03-07 MED ORDER — ONETOUCH ULTRA VI STRP: ORAL_STRIP | 4 refills | Status: DC

## 2022-03-07 NOTE — Telephone Encounter (Signed)
TC from [place] said she was told to check her BS BID Sent to pharmacy as BID, will see if ins will pay since pt is on [facility] but not on ins

## 2022-03-18 ENCOUNTER — Encounter: Payer: Self-pay | Admitting: Family Medicine

## 2022-03-18 ENCOUNTER — Ambulatory Visit (INDEPENDENT_AMBULATORY_CARE_PROVIDER_SITE_OTHER): Payer: Medicare Other | Admitting: Family Medicine

## 2022-03-18 VITALS — BP 147/85 | HR 74 | Temp 97.7°F | Ht 64.0 in | Wt 159.0 lb

## 2022-03-18 DIAGNOSIS — L03116 Cellulitis of left lower limb: Secondary | ICD-10-CM

## 2022-03-18 DIAGNOSIS — T3695XA Adverse effect of unspecified systemic antibiotic, initial encounter: Secondary | ICD-10-CM

## 2022-03-18 DIAGNOSIS — N3281 Overactive bladder: Secondary | ICD-10-CM

## 2022-03-18 DIAGNOSIS — B379 Candidiasis, unspecified: Secondary | ICD-10-CM

## 2022-03-18 MED ORDER — MIRABEGRON ER 50 MG PO TB24: 50.0000 mg | ORAL_TABLET | Freq: Every day | ORAL | 2 refills | Status: DC

## 2022-03-18 MED ORDER — AMOXICILLIN-POT CLAVULANATE 875-125 MG PO TABS: 1.0000 | ORAL_TABLET | Freq: Two times a day (BID) | ORAL | 0 refills | Status: AC

## 2022-03-18 MED ORDER — FLUCONAZOLE 150 MG PO TABS: 150.0000 mg | ORAL_TABLET | Freq: Once | ORAL | 0 refills | Status: AC

## 2022-03-18 NOTE — Progress Notes (Signed)
Assessment & Plan:  1. Overactive bladder Uncontrolled; Myrbetriq increased from 25 mg to 50 mg once daily.  Encourage patient to call and get an appointment scheduled with urology as previously referred. - mirabegron ER (MYRBETRIQ) 50 MG TB24 tablet; Take 1 tablet (50 mg total) by mouth daily.  Dispense: 30 tablet; Refill: 2  2. Cellulitis of left lower extremity Unresolved.  Starting Augmentin twice daily. - amoxicillin-clavulanate (AUGMENTIN) 875-125 MG tablet; Take 1 tablet by mouth 2 (two) times daily for 7 days.  Dispense: 14 tablet; Refill: 0  3. Antibiotic-induced yeast infection Patient is prone to yeast infections when she is treated with antibiotics.  Advised not to take unless she develops symptoms. - fluconazole (DIFLUCAN) 150 MG tablet; Take 1 tablet (150 mg total) by mouth once for 1 dose. May repeat after 3 days if needed.  Dispense: 2 tablet; Refill: 0   Follow up plan: Return as scheduled with PCP.  Hendricks Limes, MSN, APRN, FNP-C [facility]  Subjective:   Patient ID: Debra Phillips, female    DOB: [birth year], 88 y.o.   MRN: [id number]  HPI: Debra Phillips is a 88 y.o. female presenting on 03/18/2022 for Urinary Frequency (Ongoing for years.  Patient has to schedule an appointment with urology. ) and ER follow up (6/26- cellulitis lower legs )  Patient is accompanied by her daughter who she is okay with being present.  Patient is concerned about urinary frequency that has been ongoing for years.  She has a prescription for Myrbetriq 25 units once daily but the daughter feels she is taking.  She was previously referred to urology, but does not yet have an appointment scheduled.  Patient is also concerned about cellulitis of her legs.  She was seen at urgent care a month ago and prescribed an antibiotic which she did not take as prescribed since it was 3 times a day.  She continues to report swelling, tenderness, and pain.   ROS:  Negative unless specifically indicated above in HPI.   Relevant past medical history reviewed and updated as indicated.   Allergies and medications reviewed and updated.   Current Outpatient Medications:    Artificial Saliva (BIOTENE DRY MOUTH MOISTURIZING) SOLN, Use as directed 5 mLs in the mouth or throat at bedtime., Disp: 44.3 mL, Rfl: 1   aspirin EC 81 MG tablet, Take 81 mg by mouth daily., Disp: , Rfl:    Cholecalciferol (VITAMIN D3) 10 MCG (400 UNIT) CAPS, Take by mouth., Disp: , Rfl:    diltiazem (CARDIZEM CD) 180 MG 24 hr capsule, TAKE (1) CAPSULE DAILY for blood pressure, Disp: 90 capsule, Rfl: 3   donepezil (ARICEPT) 5 MG tablet, Take 1 tablet (5 mg total) by mouth at bedtime. For memory, Disp: 90 tablet, Rfl: 0   estradiol (ESTRACE) 0.1 MG/GM vaginal cream, APPLY A PEA SIZE AMOUNT PER VAGINA 2-3 NIGHTS A WEEK -FOR VAGINAL USE-, Disp: 42.5 g, Rfl: 6   Lancets (ONETOUCH DELICA PLUS [id number]) MISC, CHECK BLOOD SUGAR ONCE A DAY Dx E11.69, Disp: 100 each, Rfl: 4   lisinopril (ZESTRIL) 40 MG tablet, Take 1 tablet (40 mg total) by mouth daily. For blood pressure, Disp: 90 tablet, Rfl: 3   metFORMIN (GLUCOPHAGE) 1000 MG tablet, TAKE (1) TABLET TWICE A DAY WITH MEALS (BREAKFAST AND SUPPER), Disp: 180 tablet, Rfl: 0   multivitamin-lutein (OCUVITE-LUTEIN) CAPS capsule, Take 1 capsule by mouth daily., Disp: , Rfl:    MYRBETRIQ 25 MG TB24 tablet, Take 1 tablet (  25 mg total) by mouth daily. For overactive bladder, Disp: 90 tablet, Rfl: 3   nitroGLYCERIN (NITROSTAT) 0.4 MG SL tablet, Place 1 tablet (0.4 mg total) under the tongue every 5 (five) minutes as needed for chest pain., Disp: 25 tablet, Rfl: 3   nystatin cream (MYCOSTATIN), Apply 1 application. topically 2 (two) times daily., Disp: 30 g, Rfl: 0   Omega-3 Fatty Acids (FISH OIL) 1200 MG CAPS, Take by mouth., Disp: , Rfl:    ONETOUCH ULTRA test strip, CHECK BLOOD SUGAR BID Dx E11.69, Disp: 100 strip, Rfl: 4   pantoprazole (PROTONIX) 40 MG  tablet, Take 1 tablet (40 mg total) by mouth daily., Disp: 90 tablet, Rfl: 1   pravastatin (PRAVACHOL) 40 MG tablet, Take 1 tablet (40 mg total) by mouth daily., Disp: 90 tablet, Rfl: 1   sitaGLIPtin (JANUVIA) 50 MG tablet, Take 1 tablet (50 mg total) by mouth daily. For sugar.  STOP FARXIGA, Disp: 90 tablet, Rfl: 0   sodium chloride (OCEAN) 0.65 % SOLN nasal spray, Place 1 spray into both nostrils as needed for congestion. (Patient not taking: Reported on 03/18/2022), Disp: 30 mL, Rfl: 0  Allergies  Allergen Reactions   Codeine Nausea And Vomiting   Doxycycline Hives   Other Nausea Only    Almost all antibiotics   Sulfonamide Derivatives Hives   Vancomycin Itching    Objective:   BP (!) 147/85   Pulse 74   Temp 97.7 F (36.5 C) (Temporal)   Ht '5\' 4"'$  (1.626 m)   Wt 159 lb (72.1 kg)   SpO2 99%   BMI 27.29 kg/m    Physical Exam Vitals reviewed.  Constitutional:      General: She is not in acute distress.    Appearance: Normal appearance. She is not ill-appearing, toxic-appearing or diaphoretic.  HENT:     Head: Normocephalic and atraumatic.  Eyes:     General: No scleral icterus.       Right eye: No discharge.        Left eye: No discharge.     Conjunctiva/sclera: Conjunctivae normal.  Cardiovascular:     Rate and Rhythm: Normal rate and regular rhythm.     Heart sounds: Normal heart sounds. No murmur heard.    No friction rub. No gallop.  Pulmonary:     Effort: Pulmonary effort is normal. No respiratory distress.     Breath sounds: Normal breath sounds. No stridor. No wheezing, rhonchi or rales.  Musculoskeletal:        General: Normal range of motion.     Cervical back: Normal range of motion.     Right lower leg: Edema present.     Left lower leg: Edema present.  Skin:    General: Skin is warm and dry.     Capillary Refill: Capillary refill takes less than 2 seconds.     Findings: Erythema (LLE) present.  Neurological:     General: No focal deficit present.      Mental Status: She is alert and oriented to person, place, and time. Mental status is at baseline.  Psychiatric:        Mood and Affect: Mood normal.        Behavior: Behavior normal.        Thought Content: Thought content normal.        Judgment: Judgment normal.

## 2022-03-18 NOTE — Patient Instructions (Signed)
Schedule appointment with urology.

## 2022-03-21 ENCOUNTER — Other Ambulatory Visit: Payer: Self-pay | Admitting: Family Medicine

## 2022-03-21 DIAGNOSIS — L03116 Cellulitis of left lower limb: Secondary | ICD-10-CM

## 2022-03-27 ENCOUNTER — Ambulatory Visit (INDEPENDENT_AMBULATORY_CARE_PROVIDER_SITE_OTHER): Payer: Medicare Other | Admitting: Pharmacist

## 2022-03-27 DIAGNOSIS — E785 Hyperlipidemia, unspecified: Secondary | ICD-10-CM

## 2022-03-27 DIAGNOSIS — E1169 Type 2 diabetes mellitus with other specified complication: Secondary | ICD-10-CM | POA: Diagnosis not present

## 2022-03-27 NOTE — Progress Notes (Signed)
    03/27/2022 Name: Debra Phillips MRN: [id number] DOB: 05/12/1929   S:  88 yo F Presents for diabetes evaluation, education, and management.  Patient was referred and last seen by Primary Care Provider on 02/04/22.  Patient's daughter is present for visit and assists her mother at home (visits often).  PCP was concerned due to A1c rising to 8.0%.  Januvia '50mg'$  daily was recently added to patient's regimen.  Patient was unable to take [place] due to recurrent UTIs.  Insurance coverage/medication affordability: medicare  Patient reports adherence with medications, however there are some memory issues reported by daughter (present for visit).  Patient does forget to take metformin and Januvia at times. Current diabetes medications include: metformin, januvia Current hypertension medications include: lisinopril Goal 130/80 Current hyperlipidemia medications include: pravastatin   Patient denies hypoglycemic events.   Patient reported dietary habits: Eats 2 meals/day  Breakfast: cereal/fruit  Dinner: varies  Drinks: water  Patient-reported exercise habits: n/a   Patient denies nocturia (nighttime urination).  Patient denies neuropathy (nerve pain).  Patient reports visual changes.  Macular degeneration   Patient reports self foot exams.    O:  Lab Results  Component Value Date   HGBA1C 8.0 (H) 02/04/2022    There were no vitals filed for this visit.     Lipid Panel     Component Value Date/Time   CHOL 167 09/03/2018 1050   CHOL 182 03/14/2013 1104   TRIG 141 09/03/2018 1050   TRIG 330 (H) 01/14/2016 1148   TRIG 205 (H) 03/14/2013 1104   HDL 73 09/03/2018 1050   HDL 34 (L) 01/14/2016 1148   HDL 40 03/14/2013 1104   CHOLHDL 2.3 09/03/2018 1050   CHOLHDL 6.5 03/19/2014 0300   VLDL 55 (H) 03/19/2014 0300   LDLCALC 66 09/03/2018 1050   LDLCALC 117 (H) 12/19/2013 1140   LDLCALC 101 (H) 03/14/2013 1104   LDLDIRECT 85 02/04/2022 1418     Home fasting blood  sugars: <180  2 hour post-meal/random blood sugars: mostly <200, some >200.    Clinical Atherosclerotic Cardiovascular Disease (ASCVD): No   The ASCVD Risk score (Arnett DK, et al., 2019) failed to calculate for the following reasons:   The 2019 ASCVD risk score is only valid for ages 76 to 36    A/P:  Diabetes T2DM currently uncontrolled.  Patient/daughter did not bring in blood sugar logs, therefore we were unable to discuss in great detail. Reviewed goals, etc. Counseled patient/daughter to continue taking medications from pill box as directed (not missing any doses).  I believe the addition of Januvia will be helpful if patient remembers to take.  Encouraged patient to bring in blood sugar logs to next PCP appt.    -Continue medications as prescribed. -Extensively discussed pathophysiology of diabetes, recommended lifestyle interventions, dietary effects on blood sugar control  -Counseled on s/sx of and management of hypoglycemia  -Patient's daughter concerned about newly started Aricept for memory due to patient's nightmares Aricept --3% of dream disorder  May consider namenda   Written patient instructions provided.  Total time in face to face counseling 30  minutes.

## 2022-04-21 ENCOUNTER — Other Ambulatory Visit: Payer: Self-pay | Admitting: Family Medicine

## 2022-04-21 DIAGNOSIS — E1169 Type 2 diabetes mellitus with other specified complication: Secondary | ICD-10-CM

## 2022-04-21 DIAGNOSIS — F03B0 Unspecified dementia, moderate, without behavioral disturbance, psychotic disturbance, mood disturbance, and anxiety: Secondary | ICD-10-CM

## 2022-05-06 ENCOUNTER — Ambulatory Visit (INDEPENDENT_AMBULATORY_CARE_PROVIDER_SITE_OTHER): Payer: Medicare Other | Admitting: Family Medicine

## 2022-05-06 ENCOUNTER — Encounter: Payer: Self-pay | Admitting: Family Medicine

## 2022-05-06 VITALS — BP 139/79 | HR 74 | Temp 97.2°F | Ht 64.0 in | Wt 157.2 lb

## 2022-05-06 DIAGNOSIS — E1169 Type 2 diabetes mellitus with other specified complication: Secondary | ICD-10-CM | POA: Diagnosis not present

## 2022-05-06 DIAGNOSIS — N3000 Acute cystitis without hematuria: Secondary | ICD-10-CM | POA: Diagnosis not present

## 2022-05-06 DIAGNOSIS — I152 Hypertension secondary to endocrine disorders: Secondary | ICD-10-CM

## 2022-05-06 DIAGNOSIS — E1159 Type 2 diabetes mellitus with other circulatory complications: Secondary | ICD-10-CM | POA: Diagnosis not present

## 2022-05-06 DIAGNOSIS — E785 Hyperlipidemia, unspecified: Secondary | ICD-10-CM

## 2022-05-06 DIAGNOSIS — F03B0 Unspecified dementia, moderate, without behavioral disturbance, psychotic disturbance, mood disturbance, and anxiety: Secondary | ICD-10-CM

## 2022-05-06 DIAGNOSIS — R3989 Other symptoms and signs involving the genitourinary system: Secondary | ICD-10-CM | POA: Diagnosis not present

## 2022-05-06 LAB — MICROSCOPIC EXAMINATION
RBC, Urine: NONE SEEN /hpf (ref 0–2)
Renal Epithel, UA: NONE SEEN /hpf

## 2022-05-06 LAB — URINALYSIS, ROUTINE W REFLEX MICROSCOPIC
Bilirubin, UA: NEGATIVE
Glucose, UA: NEGATIVE
Nitrite, UA: POSITIVE — AB
Protein,UA: NEGATIVE
RBC, UA: NEGATIVE
Specific Gravity, UA: 1.01 (ref 1.005–1.030)
Urobilinogen, Ur: 0.2 mg/dL (ref 0.2–1.0)
pH, UA: 5.5 (ref 5.0–7.5)

## 2022-05-06 LAB — BAYER DCA HB A1C WAIVED: HB A1C (BAYER DCA - WAIVED): 7.1 % — ABNORMAL HIGH (ref 4.8–5.6)

## 2022-05-06 MED ORDER — MEMANTINE HCL 5 MG PO TABS: 5.0000 mg | ORAL_TABLET | Freq: Two times a day (BID) | ORAL | 12 refills | Status: DC

## 2022-05-06 MED ORDER — CEFDINIR 300 MG PO CAPS: 300.0000 mg | ORAL_CAPSULE | Freq: Two times a day (BID) | ORAL | 0 refills | Status: AC

## 2022-05-06 NOTE — Progress Notes (Signed)
Subjective: CC:DM PCP: [name], DO ZSW:FUXNAT [name] is a 88 y.o. female presenting to clinic today for:  1. Type 2 Diabetes with hypertension, hyperlipidemia:  We discontinued the Farxiga at last visit due to recurrent vaginal and urinary tract infections.  We started Januvia as her sugar was not well controlled.  Today she presents and notes that she has been doing okay.  She continues to have some urinary pressure and frequency.  Not sure if her bladder is dropping or if she has another urinary tract infection.  Last eye exam: Up-to-date Last foot exam: Up to date  Last A1c:  Lab Results  Component Value Date   HGBA1C 8.0 (H) 02/04/2022   Nephropathy screen indicated?: UTD Last flu, zoster and/or pneumovax:  Immunization History  Administered Date(s) Administered   Fluad Quad(high Dose 65+) 07/20/2019, 05/24/2021   Hepatitis B 07/02/1998, 08/06/1998, 01/07/1999   Influenza Inj Mdck Quad With Preservative 07/13/2020   Influenza Split 05/31/2010, 06/11/2011, 06/06/2013, 07/10/2021   Influenza, High Dose Seasonal PF 06/13/2016, 06/18/2017, 06/29/2018   Influenza, Seasonal, Injecte, Preservative Fre 06/29/2009, 06/04/2012   Influenza,inj,Quad PF,6+ Mos 06/15/2015   Influenza-Unspecified 06/17/1996, 06/14/1997, 06/12/1998, 05/31/1999, 06/15/2001, 06/09/2002, 06/07/2003, 04/25/2013, 05/10/2014, 06/13/2016, 06/18/2017, 06/29/2018, 07/13/2020   Moderna Sars-Covid-2 Vaccination 10/10/2019, 11/21/2019   Pneumococcal Conjugate-13 08/24/2013   Pneumococcal Polysaccharide-23 06/17/1996   Td 02/17/2017   Tdap 02/17/2017   Zoster, Live 03/17/2014    ROS: No chest or shortness of breath.  She has chronic lower extremity edema  2.  Memory loss Aricept also started for memory loss.  She did not tolerate Aricept secondary to nightmares.  Her daughter would like to proceed with alternative therapy.  ROS: Per HPI  Allergies  Allergen Reactions   Codeine Nausea And  Vomiting   Doxycycline Hives   Other Nausea Only    Almost all antibiotics   Sulfonamide Derivatives Hives   Vancomycin Itching   Farxiga [Dapagliflozin]     Recurrent UTIs   Past Medical History:  Diagnosis Date   Abnormality of gait 01/29/2016   Aortic root dilatation (HCC)    Mild   Cataract    Diabetes mellitus without complication (HCC)    type 2   Dyslipidemia    Dyspnea    with exertion   Dysrhythmia    hx of occ palpitations   Esophageal stricture    s/p dilation   GERD (gastroesophageal reflux disease)    Headache    History of hiatal hernia    Hyperlipidemia    Hypertension    Macular pucker, left eye    Osteoarthritis    Osteoporosis    Tinnitus    left ear last week or so    Current Outpatient Medications:    Artificial Saliva (BIOTENE DRY MOUTH MOISTURIZING) SOLN, Use as directed 5 mLs in the mouth or throat at bedtime., Disp: 44.3 mL, Rfl: 1   aspirin EC 81 MG tablet, Take 81 mg by mouth daily., Disp: , Rfl:    Cholecalciferol (VITAMIN D3) 10 MCG (400 UNIT) CAPS, Take by mouth., Disp: , Rfl:    diltiazem (CARDIZEM CD) 180 MG 24 hr capsule, TAKE (1) CAPSULE DAILY for blood pressure, Disp: 90 capsule, Rfl: 3   donepezil (ARICEPT) 5 MG tablet, TAKE ONE TABLET AT BEDTIME FOR MEMORY, Disp: 90 tablet, Rfl: 0   estradiol (ESTRACE) 0.1 MG/GM vaginal cream, APPLY A PEA SIZE AMOUNT PER VAGINA 2-3 NIGHTS A WEEK -FOR VAGINAL USE-, Disp: 42.5 g, Rfl: 6  JANUVIA 50 MG tablet, TAKE ONE TABLET ONCE DAILY FOR SUGAR. STOP FARXIGA, Disp: 90 tablet, Rfl: 0   Lancets (ONETOUCH DELICA PLUS [id number]) MISC, CHECK BLOOD SUGAR ONCE A DAY Dx E11.69, Disp: 100 each, Rfl: 4   lisinopril (ZESTRIL) 40 MG tablet, Take 1 tablet (40 mg total) by mouth daily. For blood pressure, Disp: 90 tablet, Rfl: 3   metFORMIN (GLUCOPHAGE) 1000 MG tablet, TAKE (1) TABLET TWICE A DAY WITH MEALS (BREAKFAST AND SUPPER), Disp: 180 tablet, Rfl: 0   mirabegron ER (MYRBETRIQ) 50 MG TB24 tablet, Take 1 tablet  (50 mg total) by mouth daily., Disp: 30 tablet, Rfl: 2   multivitamin-lutein (OCUVITE-LUTEIN) CAPS capsule, Take 1 capsule by mouth daily., Disp: , Rfl:    nitroGLYCERIN (NITROSTAT) 0.4 MG SL tablet, Place 1 tablet (0.4 mg total) under the tongue every 5 (five) minutes as needed for chest pain., Disp: 25 tablet, Rfl: 3   nystatin cream (MYCOSTATIN), Apply 1 application. topically 2 (two) times daily., Disp: 30 g, Rfl: 0   Omega-3 Fatty Acids (FISH OIL) 1200 MG CAPS, Take by mouth., Disp: , Rfl:    ONETOUCH ULTRA test strip, CHECK BLOOD SUGAR BID Dx E11.69, Disp: 100 strip, Rfl: 4   pantoprazole (PROTONIX) 40 MG tablet, Take 1 tablet (40 mg total) by mouth daily., Disp: 90 tablet, Rfl: 1   pravastatin (PRAVACHOL) 40 MG tablet, Take 1 tablet (40 mg total) by mouth daily., Disp: 90 tablet, Rfl: 1   sodium chloride (OCEAN) 0.65 % SOLN nasal spray, Place 1 spray into both nostrils as needed for congestion. 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    Comment: Retired  Tobacco Use   Smoking status: Never   Smokeless tobacco: Never  Scientific laboratory technician Use: Never used  Substance and Sexual Activity   Alcohol use: No   Drug use: No   Sexual activity: Not Currently  Other Topics Concern   Not on file  Social History Narrative   Pt lives at home alone   Husband w/ Alzheimer's passed away   Right-handed   Drinks 1-2 cups of coffee daily   Social Determinants of Health   Financial Resource Strain: Low Risk  (11/11/2021)   Overall Financial Resource Strain (CARDIA)    Difficulty of Paying Living Expenses: Not very hard  Food Insecurity: No Food Insecurity (11/11/2021)   Hunger Vital Sign    Worried About Running  Out of Food in the Last Year: Never true    [place] in the Last Year: Never true  Transportation Needs: No Transportation Needs (11/11/2021)   PRAPARE - Hydrologist (Medical): No    Lack of Transportation (Non-Medical): No  Physical Activity: Insufficiently Active (11/11/2021)   Exercise Vital Sign    Days of Exercise per Week: 7 days    Minutes of Exercise per Session: 10 min  Stress: No Stress Concern Present (11/11/2021)   [place]    Feeling of Stress : Only a little  Social Connections: Moderately Integrated (11/11/2021)   Social Connection and Isolation Panel [NHANES]    Frequency  of Communication with Friends and Family: More than three times a week    Frequency of Social Gatherings with Friends and Family: More than three times a week    Attends Religious Services: More than 4 times per year    Active Member of [employer] or Organizations: Yes    Attends Archivist Meetings: More than 4 times per year    Marital Status: Widowed  Intimate Partner Violence: Not At Risk (11/11/2021)   Humiliation, Afraid, Rape, and Kick questionnaire    Fear of Current or Ex-Partner: No    Emotionally Abused: No    Physically Abused: No    Sexually Abused: No   Family History  Problem Relation Age of Onset   Pneumonia Mother    Brain cancer Father    Tremor Father    Arthritis Sister    COPD Sister    Emphysema Brother    COPD Brother    Tremor Brother    Lung cancer Brother    Heart disease Brother    Liver cancer Brother    Diabetes Other         1 child has diabetes at age 107 and the other 4 are healthy    Objective: Office vital signs reviewed. BP 139/79   Pulse 74   Temp (!) 97.2 F (36.2 C) (Temporal)   Ht '5\' 4"'$  (1.626 m)   Wt 157 lb 3.2 oz (71.3 kg)   SpO2 96%   BMI 26.98 kg/m   Physical Examination:  General: Awake, alert, well appearing elderly female, No  acute distress HEENT: Sclera white.  Mucous membranes slightly tacky Cardio: regular rate and rhythm, S1S2 heard, no murmurs appreciated Pulm: clear to auscultation bilaterally, no wheezes, rhonchi or rales; normal work of breathing on room air Extremities: warm, well perfused, nonpitting edema present no cyanosis or clubbing  MSK: Slow gait.  She requires assistance for ambulation Skin: Minimal erythema and stasis venous changes appreciated along bilateral lower extremities Neuro: Follow commands  Assessment/ Plan: 88 y.o. female   Type 2 diabetes mellitus with hyperlipidemia ([place]) - Plan: Bayer DCA Hb A1c Waived  Hypertension associated with diabetes ([place])  Moderate dementia without behavioral disturbance, psychotic disturbance, mood disturbance, or anxiety, unspecified dementia type ([place]) - Plan: memantine (NAMENDA) 5 MG tablet  Acute cystitis without hematuria - Plan: Urinalysis, Routine w reflex microscopic, cefdinir (OMNICEF) 300 MG capsule  Sugar under better control with A1c down to 7.1 now.  Continue current regimen  Blood pressure is controlled.  No changes  Trial of Namenda.  [place] sent for what appears to be a urinary tract infection.  Sent for urine culture.  May need to consider referral to urogynecology if she in fact does have a cystocele which is putting her at increased risk of UTI  No orders of the defined types were placed in this encounter.  No orders of the defined types were placed in this encounter.   [name], DO [place] [phone]

## 2022-06-11 ENCOUNTER — Telehealth: Payer: Self-pay | Admitting: Family Medicine

## 2022-06-11 NOTE — Telephone Encounter (Signed)
Called and spoke to Debra Phillips wants to know if namenda could be affecting her vision?

## 2022-06-11 NOTE — Telephone Encounter (Signed)
Pt says that she thinks rx memantine (NAMENDA) 5 MG tablet is affecting her vision. Please call back

## 2022-06-11 NOTE — Telephone Encounter (Signed)
There is nothing in the literature with that as a side effect.  Will cc to Almyra Free to be sure.

## 2022-06-19 ENCOUNTER — Encounter: Payer: Self-pay | Admitting: Family Medicine

## 2022-06-19 ENCOUNTER — Ambulatory Visit (INDEPENDENT_AMBULATORY_CARE_PROVIDER_SITE_OTHER): Payer: Medicare Other | Admitting: Family Medicine

## 2022-06-19 VITALS — BP 115/70 | HR 76 | Temp 98.4°F | Ht 64.0 in | Wt 153.5 lb

## 2022-06-19 DIAGNOSIS — R5383 Other fatigue: Secondary | ICD-10-CM | POA: Diagnosis not present

## 2022-06-19 DIAGNOSIS — J069 Acute upper respiratory infection, unspecified: Secondary | ICD-10-CM

## 2022-06-19 DIAGNOSIS — R051 Acute cough: Secondary | ICD-10-CM | POA: Diagnosis not present

## 2022-06-19 DIAGNOSIS — Z20822 Contact with and (suspected) exposure to covid-19: Secondary | ICD-10-CM | POA: Diagnosis not present

## 2022-06-19 MED ORDER — MOLNUPIRAVIR EUA 200MG CAPSULE: 4.0000 | ORAL_CAPSULE | Freq: Two times a day (BID) | ORAL | 0 refills | Status: AC

## 2022-06-19 NOTE — Progress Notes (Signed)
Acute Office Visit  Subjective:     Patient ID: Debra Phillips, female    DOB: 1929-05-10, 88 y.o.   MRN: [id number]  Chief Complaint  Patient presents with   Cough   Nasal Congestion   Fatigue   Here with daughter today.   URI  This is a new problem. Episode onset: 2 days. The problem has been unchanged. There has been no fever. Associated symptoms include coughing, headaches, rhinorrhea, sinus pain, sneezing, a sore throat and swollen glands. Pertinent negatives include no abdominal pain, chest pain, congestion, diarrhea, ear pain, vomiting, shortness of breath, or wheezing. Treatments tried: mucinex. The treatment provided mild relief.  Possible exposure to Covid, would like to be tested.   Review of Systems  HENT:  Positive for rhinorrhea, sinus pain, sneezing and sore throat. Negative for congestion and ear pain.   Respiratory:  Positive for cough. Negative for wheezing.   Cardiovascular:  Negative for chest pain.  Gastrointestinal:  Negative for abdominal pain, diarrhea and vomiting.  Neurological:  Positive for headaches.        Objective:    BP 115/70   Pulse 76   Temp 98.4 F (36.9 C) (Temporal)   Ht '5\' 4"'$  (1.626 m)   Wt 153 lb 8 oz (69.6 kg)   SpO2 93%   BMI 26.35 kg/m     Physical Exam Vitals and nursing note reviewed.  Constitutional:      General: She is not in acute distress.    Appearance: She is not toxic-appearing or diaphoretic.  HENT:     Head: Normocephalic and atraumatic.     Right Ear: Tympanic membrane, ear canal and external ear normal.     Left Ear: Tympanic membrane, ear canal and external ear normal.     Nose: Congestion present.     Mouth/Throat:     Pharynx: Posterior oropharyngeal erythema present.     Tonsils: No tonsillar exudate or tonsillar abscesses. 1+ on the right. 1+ on the left.  Eyes:     General:        Right eye: No discharge.        Left eye: No discharge.     Conjunctiva/sclera: Conjunctivae normal.   Cardiovascular:     Rate and Rhythm: Normal rate and regular rhythm.     Heart sounds: Normal heart sounds. No murmur heard. Pulmonary:     Effort: Pulmonary effort is normal. No respiratory distress.     Breath sounds: Normal breath sounds.  Musculoskeletal:     Cervical back: No rigidity.     Right lower leg: No edema.     Left lower leg: No edema.  Lymphadenopathy:     Cervical: Cervical adenopathy present.  Skin:    General: Skin is warm and dry.  Neurological:     Mental Status: She is alert and oriented to person, place, and time. Mental status is at baseline.  Psychiatric:        Mood and Affect: Mood normal.        Behavior: Behavior normal.     No results found for any visits on 06/19/22.      Assessment & Plan:   Lelar was seen today for cough, nasal congestion and fatigue.  Diagnoses and all orders for this visit:  Viral URI with cough Covid test pending. Viral URI symptoms with possible exposure to Covid. Dicussed antiviral treatment. Will send this in now as Covid test may not be back for another 2 days  or so. Discussed symptomatic care and return precautions.  -     Novel Coronavirus, NAA ([facility]) -     molnupiravir EUA (LAGEVRIO) 200 mg CAPS capsule; Take 4 capsules (800 mg total) by mouth 2 (two) times daily for 5 days.  Suspected COVID-19 virus infection -     molnupiravir EUA (LAGEVRIO) 200 mg CAPS capsule; Take 4 capsules (800 mg total) by mouth 2 (two) times daily for 5 days   The patient indicates understanding of these issues and agrees with the plan.  Gwenlyn Perking, FNP

## 2022-06-20 LAB — NOVEL CORONAVIRUS, NAA: SARS-CoV-2, NAA: DETECTED — AB

## 2022-07-01 NOTE — Telephone Encounter (Signed)
Sorry for delay You are correct! No mention of affecting vision

## 2022-07-09 ENCOUNTER — Other Ambulatory Visit: Payer: Self-pay | Admitting: Family Medicine

## 2022-07-09 DIAGNOSIS — E1169 Type 2 diabetes mellitus with other specified complication: Secondary | ICD-10-CM

## 2022-07-09 DIAGNOSIS — F03B0 Unspecified dementia, moderate, without behavioral disturbance, psychotic disturbance, mood disturbance, and anxiety: Secondary | ICD-10-CM

## 2022-07-25 DIAGNOSIS — H04123 Dry eye syndrome of bilateral lacrimal glands: Secondary | ICD-10-CM | POA: Diagnosis not present

## 2022-07-25 DIAGNOSIS — H353111 Nonexudative age-related macular degeneration, right eye, early dry stage: Secondary | ICD-10-CM | POA: Diagnosis not present

## 2022-07-25 DIAGNOSIS — H353190 Nonexudative age-related macular degeneration, unspecified eye, stage unspecified: Secondary | ICD-10-CM | POA: Diagnosis not present

## 2022-09-05 ENCOUNTER — Ambulatory Visit (INDEPENDENT_AMBULATORY_CARE_PROVIDER_SITE_OTHER): Payer: Medicare Other | Admitting: Family Medicine

## 2022-09-05 ENCOUNTER — Encounter: Payer: Self-pay | Admitting: Family Medicine

## 2022-09-05 VITALS — BP 142/78 | HR 74 | Temp 98.2°F | Ht 64.0 in | Wt 156.4 lb

## 2022-09-05 DIAGNOSIS — I152 Hypertension secondary to endocrine disorders: Secondary | ICD-10-CM | POA: Diagnosis not present

## 2022-09-05 DIAGNOSIS — E1159 Type 2 diabetes mellitus with other circulatory complications: Secondary | ICD-10-CM | POA: Diagnosis not present

## 2022-09-05 DIAGNOSIS — R300 Dysuria: Secondary | ICD-10-CM | POA: Diagnosis not present

## 2022-09-05 DIAGNOSIS — Z23 Encounter for immunization: Secondary | ICD-10-CM

## 2022-09-05 DIAGNOSIS — E1169 Type 2 diabetes mellitus with other specified complication: Secondary | ICD-10-CM

## 2022-09-05 DIAGNOSIS — F03B0 Unspecified dementia, moderate, without behavioral disturbance, psychotic disturbance, mood disturbance, and anxiety: Secondary | ICD-10-CM | POA: Diagnosis not present

## 2022-09-05 DIAGNOSIS — E785 Hyperlipidemia, unspecified: Secondary | ICD-10-CM | POA: Diagnosis not present

## 2022-09-05 LAB — URINALYSIS, ROUTINE W REFLEX MICROSCOPIC
Bilirubin, UA: NEGATIVE
Glucose, UA: NEGATIVE
Ketones, UA: NEGATIVE
Leukocytes,UA: NEGATIVE
Nitrite, UA: NEGATIVE
Protein,UA: NEGATIVE
RBC, UA: NEGATIVE
Specific Gravity, UA: 1.02 (ref 1.005–1.030)
Urobilinogen, Ur: 0.2 mg/dL (ref 0.2–1.0)
pH, UA: 5.5 (ref 5.0–7.5)

## 2022-09-05 LAB — MICROSCOPIC EXAMINATION
RBC, Urine: NONE SEEN /hpf (ref 0–2)
Renal Epithel, UA: NONE SEEN /hpf

## 2022-09-05 LAB — BAYER DCA HB A1C WAIVED: HB A1C (BAYER DCA - WAIVED): 7.4 % — ABNORMAL HIGH (ref 4.8–5.6)

## 2022-09-05 MED ORDER — SITAGLIPTIN PHOSPHATE 50 MG PO TABS: ORAL_TABLET | ORAL | 3 refills | Status: DC

## 2022-09-05 MED ORDER — PRAVASTATIN SODIUM 40 MG PO TABS: 40.0000 mg | ORAL_TABLET | Freq: Every day | ORAL | 3 refills | Status: DC

## 2022-09-05 MED ORDER — PANTOPRAZOLE SODIUM 40 MG PO TBEC: 40.0000 mg | DELAYED_RELEASE_TABLET | Freq: Every day | ORAL | 3 refills | Status: DC

## 2022-09-05 MED ORDER — DONEPEZIL HCL 5 MG PO TABS: ORAL_TABLET | ORAL | 3 refills | Status: DC

## 2022-09-05 NOTE — Progress Notes (Signed)
Subjective: CC:Dm PCP: [name], DO GMW:NUUVOZ Debra Phillips is a 89 y.o. female presenting to clinic today for:  1. Type 2 Diabetes with hypertension, hyperlipidemia:  Patient reports compliance with medications.  She is accompanied today's visit by her daughter.  She reports some sadness over the holidays as her child passed away last fall and there was a palpable absence during the holidays  Last eye exam: My eye doctor Last foot exam: Up-to-date Last A1c:  Lab Results  Component Value Date   HGBA1C 7.1 (H) 05/06/2022   Nephropathy screen indicated?:  Up-to-date Last flu, zoster and/or pneumovax:  Immunization History  Administered Date(s) Administered   Fluad Quad(high Dose 65+) 07/20/2019, 05/24/2021, 09/05/2022   Hepatitis B 07/02/1998, 08/06/1998, 01/07/1999   Influenza Inj Mdck Quad With Preservative 07/13/2020   Influenza Split 05/31/2010, 06/11/2011, 06/06/2013, 07/10/2021   Influenza, High Dose Seasonal PF 06/13/2016, 06/18/2017, 06/29/2018   Influenza, Seasonal, Injecte, Preservative Fre 06/29/2009, 06/04/2012   Influenza,inj,Quad PF,6+ Mos 06/15/2015   Influenza-Unspecified 06/17/1996, 06/14/1997, 06/12/1998, 05/31/1999, 06/15/2001, 06/09/2002, 06/07/2003, 04/25/2013, 05/10/2014, 06/13/2016, 06/18/2017, 06/29/2018, 07/13/2020   Moderna Sars-Covid-2 Vaccination 10/10/2019, 11/21/2019   Pneumococcal Conjugate-13 08/24/2013   Pneumococcal Polysaccharide-23 06/17/1996   Td 02/17/2017   Td (Adult), 2 Lf Tetanus Toxid, Preservative Free 02/17/2017   Tdap 02/17/2017   Zoster, Live 03/17/2014    ROS: No chest pain, shortness of breath or dizziness.  Reports some vaginal irritation and would like to get checked out for UTI today   ROS: Per HPI  Allergies  Allergen Reactions   Codeine Nausea And Vomiting and Other (See Comments)   Doxycycline Hives   Other Nausea Only    Almost all antibiotics   Sulfonamide Derivatives Hives   Vancomycin Itching    Farxiga [Dapagliflozin]     Recurrent UTIs   Past Medical History:  Diagnosis Date   Abnormality of gait 01/29/2016   Aortic root dilatation (HCC)    Mild   Cataract    Diabetes mellitus without complication (HCC)    type 2   Dyslipidemia    Dyspnea    with exertion   Dysrhythmia    hx of occ palpitations   Esophageal stricture    s/p dilation   GERD (gastroesophageal reflux disease)    Headache    History of hiatal hernia    Hyperlipidemia    Hypertension    Macular pucker, left eye    Osteoarthritis    Osteoporosis    Tinnitus    left ear last week or so    Current Outpatient Medications:    Artificial Saliva (BIOTENE DRY MOUTH MOISTURIZING) SOLN, Use as directed 5 mLs in the mouth or throat at bedtime., Disp: 44.3 mL, Rfl: 1   aspirin EC 81 MG tablet, Take 81 mg by mouth daily., Disp: , Rfl:    Cholecalciferol (VITAMIN D3) 10 MCG (400 UNIT) CAPS, Take by mouth., Disp: , Rfl:    diltiazem (CARDIZEM CD) 180 MG 24 hr capsule, TAKE (1) CAPSULE DAILY for blood pressure, Disp: 90 capsule, Rfl: 3   estradiol (ESTRACE) 0.1 MG/GM vaginal cream, APPLY A PEA SIZE AMOUNT PER VAGINA 2-3 NIGHTS A WEEK -FOR VAGINAL USE-, Disp: 42.5 g, Rfl: 6   Lancets (ONETOUCH DELICA PLUS [id number]) MISC, CHECK BLOOD SUGAR ONCE A DAY Dx E11.69, Disp: 100 each, Rfl: 4   lisinopril (ZESTRIL) 40 MG tablet, Take 1 tablet (40 mg total) by mouth daily. For blood pressure, Disp: 90 tablet, Rfl: 3   memantine (NAMENDA) 5  MG tablet, Take 1 tablet (5 mg total) by mouth 2 (two) times daily. For memory, Disp: 60 tablet, Rfl: 12   metFORMIN (GLUCOPHAGE) 1000 MG tablet, TAKE (1) TABLET TWICE A DAY WITH MEALS (BREAKFAST AND SUPPER), Disp: 180 tablet, Rfl: 0   mirabegron ER (MYRBETRIQ) 50 MG TB24 tablet, Take 1 tablet (50 mg total) by mouth daily., Disp: 30 tablet, Rfl: 2   multivitamin-lutein (OCUVITE-LUTEIN) CAPS capsule, Take 1 capsule by mouth daily., Disp: , Rfl:    nitroGLYCERIN (NITROSTAT) 0.4 MG SL tablet, Place  1 tablet (0.4 mg total) under the tongue every 5 (five) minutes as needed for chest pain., Disp: 25 tablet, Rfl: 3   nystatin cream (MYCOSTATIN), Apply 1 application. topically 2 (two) times daily., Disp: 30 g, Rfl: 0   Omega-3 Fatty Acids (FISH OIL) 1200 MG CAPS, Take by mouth., Disp: , Rfl:    ONETOUCH ULTRA test strip, CHECK BLOOD SUGAR BID Dx E11.69, Disp: 100 strip, Rfl: 4   sodium chloride (OCEAN) 0.65 % SOLN nasal spray, Place 1 spray into both nostrils as needed for congestion., Disp: 30 mL, Rfl: 0   donepezil (ARICEPT) 5 MG tablet, TAKE ONE TABLET AT BEDTIME FOR MEMORY, Disp: 90 tablet, Rfl: 3   pantoprazole (PROTONIX) 40 MG tablet, Take 1 tablet (40 mg total) by mouth daily., Disp: 90 tablet, Rfl: 3   pravastatin (PRAVACHOL) 40 MG tablet, Take 1 tablet (40 mg total) by mouth daily., Disp: 90 tablet, Rfl: 3   sitaGLIPtin (JANUVIA) 50 MG tablet, TAKE ONE TABLET ONCE DAILY FOR SUGAR., Disp: 90 tablet, Rfl: 3 Social History   Socioeconomic History   Marital status: Widowed    Spouse name: Not on file   Number of children: 5   Years of education: 5   Highest education level: Not on file  Occupational History   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    Comment: Retired  Tobacco Use   Smoking status: Never   Smokeless tobacco: Never  Scientific laboratory technician Use: Never used  Substance and Sexual Activity   Alcohol use: No   Drug use: No   Sexual activity: Not Currently  Other Topics Concern   Not on file  Social History Narrative   Pt lives at home alone   Husband w/ Alzheimer's passed away   Right-handed   Drinks 1-2 cups of coffee daily   Social Determinants of Health   Financial Resource Strain: Low Risk  (11/11/2021)   Overall Financial Resource Strain (CARDIA)    Difficulty of Paying Living Expenses: Not very hard  Food Insecurity: No Food Insecurity (11/11/2021)   Hunger Vital Sign    Worried About Running Out of Food in the Last Year: Never true    [place] in the Last Year: Never true  Transportation Needs: No Transportation Needs (11/11/2021)   PRAPARE - Hydrologist (Medical): No    Lack of Transportation (Non-Medical): No  Physical Activity: Insufficiently Active (11/11/2021)   Exercise Vital Sign    Days of Exercise per Week: 7 days    Minutes of Exercise per Session: 10 min  Stress: No Stress Concern Present (11/11/2021)   [place]    Feeling of Stress : Only a little  Social Connections: Moderately Integrated (11/11/2021)   Social Connection and Isolation Panel [NHANES]    Frequency of Communication with Friends and Family: More than three times  a week    Frequency of Social Gatherings with Friends and Family: More than three times a week    Attends Religious Services: More than 4 times per year    Active Member of Clubs or Organizations: Yes    Attends Archivist Meetings: More than 4 times per year    Marital Status: Widowed  Intimate Partner Violence: Not At Risk (11/11/2021)   Humiliation, Afraid, Rape, and Kick questionnaire    Fear of Current or Ex-Partner: No    Emotionally Abused: No    Physically Abused: No    Sexually Abused: No   Family History  Problem Relation Age of Onset   Pneumonia Mother    Brain cancer Father    Tremor Father    Arthritis Sister    COPD Sister    Emphysema Brother    COPD Brother    Tremor Brother    Lung cancer Brother    Heart disease Brother    Liver cancer Brother    Diabetes Other         1 child has diabetes at age 58 and the other 4 are healthy    Objective: Office vital signs reviewed. BP (!) 142/78   Pulse 74   Temp 98.2 F (36.8 C)   Ht '5\' 4"'$  (1.626 Debra)   Wt 156 lb 6.4 oz (70.9 kg)   SpO2 98%   BMI 26.85 kg/Debra   Physical Examination:  General: Awake, alert, well nourished, No acute distress HEENT: Sclera white.  Moist mucous membranes Cardio: regular  rate and rhythm, S1S2 heard, no murmurs appreciated Pulm: clear to auscultation bilaterally, no wheezes, rhonchi or rales; normal work of breathing on room air Extremities: Edema present in the lower extremities bilaterally.  She has well-healed postsurgical scars along bilateral knees.   MSK: Ambulates with assistance  Assessment/ Plan: 89 y.o. female   Type 2 diabetes mellitus with hyperlipidemia ([place]) - Plan: Bayer DCA Hb A1c Waived, sitaGLIPtin (JANUVIA) 50 MG tablet, pravastatin (PRAVACHOL) 40 MG tablet, pantoprazole (PROTONIX) 40 MG tablet  Hypertension associated with diabetes ([place])  Dysuria - Plan: Urinalysis, Routine w reflex microscopic, Urine Culture  Need for immunization against influenza - Plan: Flu Vaccine QUAD High Dose(Fluad)  Moderate dementia without behavioral disturbance, psychotic disturbance, mood disturbance, or anxiety, unspecified dementia type (HCC) - Plan: donepezil (ARICEPT) 5 MG tablet  Sugar controlled for age at 7.4 today.  No changes.  Medications have been renewed  Blood pressure controlled for age.  No changes  Urinalysis shows bacteria but no white blood cell count or nitrites.  For this reason we will send for culture before starting any medications  Dementia is chronic and stable.  Medications have been renewed.  Orders Placed This Encounter  Procedures   Urine Culture   Microscopic Examination   Flu Vaccine QUAD High Dose(Fluad)   Bayer DCA Hb A1c Waived   Urinalysis, Routine w reflex microscopic   Meds ordered this encounter  Medications   sitaGLIPtin (JANUVIA) 50 MG tablet    Sig: TAKE ONE TABLET ONCE DAILY FOR SUGAR.    Dispense:  90 tablet    Refill:  3    TO BE FILLED 12/4 IN SYNC WITH OTHER RXS   pravastatin (PRAVACHOL) 40 MG tablet    Sig: Take 1 tablet (40 mg total) by mouth daily.    Dispense:  90 tablet    Refill:  3   pantoprazole (PROTONIX) 40 MG tablet    Sig: Take 1  tablet (40 mg total) by mouth daily.    Dispense:   90 tablet    Refill:  3    TO BE FILLED 12/4 IN SYNC WITH OTHER RXS   donepezil (ARICEPT) 5 MG tablet    Sig: TAKE ONE TABLET AT BEDTIME FOR MEMORY    Dispense:  90 tablet    Refill:  3    TO BE FILLED 12/4 IN [facility] WITH OTHER RXS     Debra Signor Windell Moulding, DO [place] [phone]

## 2022-09-23 ENCOUNTER — Encounter: Payer: Self-pay | Admitting: Family

## 2022-09-23 ENCOUNTER — Ambulatory Visit (INDEPENDENT_AMBULATORY_CARE_PROVIDER_SITE_OTHER): Payer: Medicare Other

## 2022-09-23 ENCOUNTER — Ambulatory Visit (INDEPENDENT_AMBULATORY_CARE_PROVIDER_SITE_OTHER): Payer: Medicare Other | Admitting: Family

## 2022-09-23 VITALS — BP 177/92 | HR 74 | Temp 97.6°F | Ht 64.0 in | Wt 158.4 lb

## 2022-09-23 DIAGNOSIS — M546 Pain in thoracic spine: Secondary | ICD-10-CM

## 2022-09-23 DIAGNOSIS — R051 Acute cough: Secondary | ICD-10-CM

## 2022-09-23 DIAGNOSIS — R059 Cough, unspecified: Secondary | ICD-10-CM | POA: Diagnosis not present

## 2022-09-23 DIAGNOSIS — J189 Pneumonia, unspecified organism: Secondary | ICD-10-CM

## 2022-09-23 DIAGNOSIS — R0602 Shortness of breath: Secondary | ICD-10-CM

## 2022-09-23 DIAGNOSIS — R079 Chest pain, unspecified: Secondary | ICD-10-CM | POA: Diagnosis not present

## 2022-09-23 MED ORDER — AZITHROMYCIN 250 MG PO TABS: ORAL_TABLET | ORAL | 0 refills | Status: DC

## 2022-09-23 MED ORDER — BENZONATATE 200 MG PO CAPS: 200.0000 mg | ORAL_CAPSULE | Freq: Three times a day (TID) | ORAL | 1 refills | Status: DC | PRN

## 2022-09-23 MED ORDER — AMOXICILLIN-POT CLAVULANATE 875-125 MG PO TABS: 1.0000 | ORAL_TABLET | Freq: Two times a day (BID) | ORAL | 0 refills | Status: DC

## 2022-09-23 NOTE — Progress Notes (Signed)
Subjective:    Patient ID: Debra Phillips, female    DOB: 11-21-1928, 89 y.o.   MRN: [id number]  Chief Complaint  Patient presents with   Back Pain    Goes to rib cage    dry mouth    Back Pain This is a new problem. The current episode started 1 to 4 weeks ago. The problem occurs constantly. The problem is unchanged. The pain is present in the thoracic spine (left). The quality of the pain is described as aching. The pain is at a severity of 5/10. The pain is moderate. The symptoms are aggravated by bending and twisting. Pertinent negatives include no fever. (SOB ) The treatment provided mild relief.  Cough This is a new problem. The current episode started 1 to 4 weeks ago. The problem has been waxing and waning. The problem occurs every few minutes. The cough is Productive of sputum. Associated symptoms include myalgias, nasal congestion and shortness of breath. Pertinent negatives include no chills, ear congestion, ear pain or fever. She has tried rest and OTC cough suppressant for the symptoms. The treatment provided mild relief.      Review of Systems  Constitutional:  Negative for chills and fever.  HENT:  Negative for ear pain.   Respiratory:  Positive for cough and shortness of breath.   Musculoskeletal:  Positive for back pain and myalgias.  All other systems reviewed and are negative.      Objective:   Physical Exam Vitals reviewed.  Constitutional:      General: She is not in acute distress.    Appearance: She is well-developed. She is obese.  HENT:     Head: Normocephalic and atraumatic.  Eyes:     Pupils: Pupils are equal, round, and reactive to light.  Neck:     Thyroid: No thyromegaly.  Cardiovascular:     Rate and Rhythm: Normal rate and regular rhythm.     Heart sounds: Normal heart sounds. No murmur heard. Pulmonary:     Effort: Pulmonary effort is normal. No respiratory distress.     Breath sounds: Rhonchi present. No wheezing.  Abdominal:      General: Bowel sounds are normal. There is no distension.     Palpations: Abdomen is soft.     Tenderness: There is no abdominal tenderness.  Musculoskeletal:        General: No tenderness. Normal range of motion.     Cervical back: Normal range of motion and neck supple.  Skin:    General: Skin is warm and dry.  Neurological:     Mental Status: She is alert and oriented to person, place, and time.     Cranial Nerves: No cranial nerve deficit.     Motor: Weakness present.     Gait: Gait abnormal.     Deep Tendon Reflexes: Reflexes are normal and symmetric.  Psychiatric:        Behavior: Behavior normal.        Thought Content: Thought content normal.        Judgment: Judgment normal.       BP (!) 177/92   Pulse 74   Temp 97.6 F (36.4 C) (Temporal)   Ht '5\' 4"'$  (1.626 m)   Wt 158 lb 6.4 oz (71.8 kg)   SpO2 100%   BMI 27.19 kg/m      Assessment & Plan:  Debra Phillips comes in today with chief complaint of Back Pain (Goes to rib cage ) and  dry mouth   Diagnosis and orders addressed:  1. Acute cough - DG Chest 2 View  2. Acute left-sided thoracic back pain - DG Chest 2 View  3. SOB (shortness of breath) - DG Chest 2 View  4. Community acquired pneumonia, unspecified laterality - Take meds as prescribed - Use a cool mist humidifier  -Use saline nose sprays frequently -Force fluids -For any cough or congestion  Use plain Mucinex- regular strength or max strength is fine -For fever or aces or pains- take tylenol or ibuprofen. -Throat lozenges if help -New toothbrush in 3 days Follow up if symptoms worsen or do not improve  - azithromycin (ZITHROMAX) 250 MG tablet; Take 500 mg once, then 250 mg for four days  Dispense: 6 tablet; Refill: 0 - amoxicillin-clavulanate (AUGMENTIN) 875-125 MG tablet; Take 1 tablet by mouth 2 (two) times daily.  Dispense: 14 tablet; Refill: 0 - benzonatate (TESSALON) 200 MG capsule; Take 1 capsule (200 mg total) by mouth 3 (three)  times daily as needed.  Dispense: 30 capsule; Refill: [place], FNP

## 2022-09-23 NOTE — Patient Instructions (Signed)
Community-Acquired Pneumonia, Adult Pneumonia is a lung infection that causes inflammation and the buildup of mucus and fluids in the lungs. This may cause coughing and difficulty breathing. Community-acquired pneumonia is pneumonia that develops in people who are not, and have not recently been, in a hospital or other health care facility. Usually, pneumonia develops as a result of an illness that is caused by a virus, such as the common cold and the flu (influenza). It can also be caused by bacteria or fungi. While the common cold and influenza can pass from person to person (are contagious), pneumonia itself is not considered contagious. What are the causes? This condition may be caused by: Viruses. Bacteria. Fungi. What increases the risk? The following factors may make you more likely to develop this condition: Being over age 65 or having certain medical conditions, such as: A long-term (chronic) disease, such as: chronic obstructive pulmonary disease (COPD), asthma, heart failure, diabetes, or kidney disease. A condition that increases the risk of breathing in (aspirating) mucus and other fluids from your mouth and nose. A weakened body defense system (immune system). Having had your spleen removed (splenectomy). The spleen is the organ that helps fight germs and infections. Not cleaning your teeth and gums well (poor dental hygiene). Using tobacco products. Traveling to places where germs that cause pneumonia are present or being near certain animals or animal habitats that could have germs that cause pneumonia. What are the signs or symptoms? Symptoms of this condition include: A dry cough or a wet (productive) cough. A fever, sweating, or chills. Chest pain, especially when breathing deeply or coughing. Fast breathing, difficulty breathing, or shortness of breath. Tiredness (fatigue) and muscle aches. How is this diagnosed? This condition may be diagnosed based on your medical  history or a physical exam. You may also have tests, including: Imaging, such as a chest X-ray or lung ultrasound. Tests of: The level of oxygen and other gases in your blood. Mucus from your lungs (sputum). Fluid around your lungs (pleural fluid). Your urine. How is this treated? Treatment for this condition depends on many factors, such as the cause of your pneumonia, your medicines, and other medical conditions that you have. For most adults, pneumonia may be treated at home. In some cases, treatment must happen in a hospital and may include: Medicines that are given by mouth (orally) or through an IV, including: Antibiotic medicines, if bacteria caused the pneumonia. Medicines that kill viruses (antiviral medicines), if a virus caused the pneumonia. Oxygen therapy. Severe pneumonia, although rare, may require the following treatments: Mechanical ventilation.This procedure uses a machine to help you breathe if you cannot breathe well on your own or maintain a safe level of blood oxygen. Thoracentesis. This procedure removes any buildup of pleural fluid to help with breathing. Follow these instructions at home:  Medicines Take over-the-counter and prescription medicines only as told by your health care provider. Take cough medicine only if you have trouble sleeping. Cough medicine can prevent your body from removing mucus from your lungs. If you were prescribed antibiotics, take them as told by your health care provider. Do not stop taking the antibiotic even if you start to feel better. Lifestyle     Do not drink alcohol. Do not use any products that contain nicotine or tobacco. These products include cigarettes, chewing tobacco, and vaping devices, such as e-cigarettes. If you need help quitting, ask your health care provider. Eat a healthy diet. This includes plenty of vegetables, fruits, whole grains, low-fat   dairy products, and lean protein. General instructions Rest a lot and  get at least 8 hours of sleep each night. Sleep in a partly upright position at night. Place a few pillows under your head or sleep in a reclining chair. Return to your normal activities as told by your health care provider. Ask your health care provider what activities are safe for you. Drink enough fluid to keep your urine pale yellow. This helps to thin the mucus in your lungs. If your throat is sore, gargle with a mixture of salt and water 3-4 times a day or as needed. To make salt water, completely dissolve -1 tsp (3-6 g) of salt in 1 cup (237 mL) of warm water. Keep all follow-up visits. How is this prevented? You can lower your risk of developing community-acquired pneumonia by: Getting the pneumonia vaccine. There are different types and schedules of pneumonia vaccines. Ask your health care provider which option is best for you. Consider getting the pneumonia vaccine if: You are older than 87 years of age. You are 19-65 years of age and are receiving cancer treatment, have chronic lung disease, or have other medical conditions that affect your immune system. Ask your health care provider if this applies to you. Getting your influenza vaccine every year. Ask your health care provider which type of vaccine is best for you. Getting regular dental checkups. Washing your hands often with soap and water for at least 20 seconds. If soap and water are not available, use hand sanitizer. Contact a health care provider if: You have a fever. You have trouble sleeping because you cannot control your cough with cough medicine. Get help right away if: Your shortness of breath becomes worse. Your chest pain increases. Your sickness becomes worse, especially if you are an older adult or have a weak immune system. You cough up blood. These symptoms may be an emergency. Get help right away. Call 911. Do not wait to see if the symptoms will go away. Do not drive yourself to the  hospital. Summary Pneumonia is an infection of the lungs. Community-acquired pneumonia develops in people who have not been in the hospital. It can be caused by bacteria, viruses, or fungi. This condition may be treated with antibiotics or antiviral medicines. Severe pneumonia may require a hospital stay and treatment to help with breathing. This information is not intended to replace advice given to you by your health care provider. Make sure you discuss any questions you have with your health care provider. Document Revised: 10/09/2021 Document Reviewed: 10/09/2021 Elsevier Patient Education  2023 [employer]  

## 2022-10-11 DIAGNOSIS — E860 Dehydration: Secondary | ICD-10-CM | POA: Diagnosis not present

## 2022-10-11 DIAGNOSIS — Z1152 Encounter for screening for COVID-19: Secondary | ICD-10-CM | POA: Diagnosis not present

## 2022-10-11 DIAGNOSIS — M79604 Pain in right leg: Secondary | ICD-10-CM | POA: Diagnosis not present

## 2022-10-11 DIAGNOSIS — R0781 Pleurodynia: Secondary | ICD-10-CM | POA: Diagnosis not present

## 2022-10-11 DIAGNOSIS — R531 Weakness: Secondary | ICD-10-CM | POA: Diagnosis not present

## 2022-10-11 DIAGNOSIS — R1084 Generalized abdominal pain: Secondary | ICD-10-CM | POA: Diagnosis not present

## 2022-10-11 DIAGNOSIS — R109 Unspecified abdominal pain: Secondary | ICD-10-CM | POA: Diagnosis not present

## 2022-10-11 DIAGNOSIS — M25512 Pain in left shoulder: Secondary | ICD-10-CM | POA: Diagnosis not present

## 2022-10-11 DIAGNOSIS — E785 Hyperlipidemia, unspecified: Secondary | ICD-10-CM | POA: Diagnosis not present

## 2022-10-11 DIAGNOSIS — I700 Atherosclerosis of aorta: Secondary | ICD-10-CM | POA: Diagnosis not present

## 2022-10-11 DIAGNOSIS — R079 Chest pain, unspecified: Secondary | ICD-10-CM | POA: Diagnosis not present

## 2022-10-11 DIAGNOSIS — M549 Dorsalgia, unspecified: Secondary | ICD-10-CM | POA: Diagnosis not present

## 2022-10-11 DIAGNOSIS — M79605 Pain in left leg: Secondary | ICD-10-CM | POA: Diagnosis not present

## 2022-10-11 DIAGNOSIS — E119 Type 2 diabetes mellitus without complications: Secondary | ICD-10-CM | POA: Diagnosis not present

## 2022-10-11 DIAGNOSIS — Z20822 Contact with and (suspected) exposure to covid-19: Secondary | ICD-10-CM | POA: Diagnosis not present

## 2022-10-11 DIAGNOSIS — I10 Essential (primary) hypertension: Secondary | ICD-10-CM | POA: Diagnosis not present

## 2022-11-03 ENCOUNTER — Other Ambulatory Visit: Payer: Self-pay | Admitting: Family Medicine

## 2022-11-03 NOTE — Telephone Encounter (Signed)
Last office visit 09/05/22 Last refill 04/11/2021, 42.5 grams, 6 refills

## 2022-11-05 ENCOUNTER — Other Ambulatory Visit: Payer: Self-pay | Admitting: Family Medicine

## 2022-11-05 DIAGNOSIS — E1169 Type 2 diabetes mellitus with other specified complication: Secondary | ICD-10-CM

## 2022-11-13 ENCOUNTER — Telehealth: Payer: Self-pay | Admitting: Family Medicine

## 2022-11-13 NOTE — Telephone Encounter (Signed)
Contacted Debra Phillips to schedule their annual wellness visit. 11/26/2022  Thank you,  [name],  AMB Clinical Support [facility] AW[facility] Program Direct Dial ??[phone]

## 2022-11-26 ENCOUNTER — Ambulatory Visit (INDEPENDENT_AMBULATORY_CARE_PROVIDER_SITE_OTHER): Payer: Medicare Other

## 2022-11-26 VITALS — Ht 62.0 in | Wt 158.0 lb

## 2022-11-26 DIAGNOSIS — Z0000 Encounter for general adult medical examination without abnormal findings: Secondary | ICD-10-CM | POA: Diagnosis not present

## 2022-11-26 DIAGNOSIS — Z0100 Encounter for examination of eyes and vision without abnormal findings: Secondary | ICD-10-CM

## 2022-11-26 NOTE — Progress Notes (Signed)
Subjective:   Debra Phillips is a 89 y.o. female who presents for Medicare Annual (Subsequent) preventive examination. I connected with  Debra Phillips on 11/26/22 by a audio enabled telemedicine application and verified that I am speaking with the correct person using two identifiers.  Patient Location: Home  Provider Location: Home Office  I discussed the limitations of evaluation and management by telemedicine. The patient expressed understanding and agreed to proceed.  Review of Systems     Cardiac Risk Factors include: advanced age (>2men, >29 women);dyslipidemia;diabetes mellitus     Objective:    Today's Vitals   11/26/22 1121  Weight: 158 lb (71.7 kg)  Height: 5\' 2"  (1.575 m)   Body mass index is 28.9 kg/m.     11/26/2022   11:26 AM 11/11/2021    3:18 PM 11/08/2020   11:08 AM 10/27/2019    9:41 AM 04/11/2019   12:37 PM 12/22/2017   11:00 PM 08/29/2016    5:00 AM  Advanced Directives  Does Patient Have a Medical Advance Directive? No No No No No No No  Would patient like information on creating a medical advance directive? No - Patient declined No - Patient declined No - Patient declined No - Patient declined No - Patient declined No - Patient declined No - Patient declined;Yes (ED - Information included in AVS)    Current Medications (verified) Outpatient Encounter Medications as of 11/26/2022  Medication Sig   Artificial Saliva (BIOTENE DRY MOUTH MOISTURIZING) SOLN Use as directed 5 mLs in the mouth or throat at bedtime.   aspirin EC 81 MG tablet Take 81 mg by mouth daily.   azithromycin (ZITHROMAX) 250 MG tablet Take 500 mg once, then 250 mg for four days   benzonatate (TESSALON) 200 MG capsule Take 1 capsule (200 mg total) by mouth 3 (three) times daily as needed.   Cholecalciferol (VITAMIN D3) 10 MCG (400 UNIT) CAPS Take by mouth.   diltiazem (CARDIZEM CD) 180 MG 24 hr capsule TAKE (1) CAPSULE DAILY for blood pressure   donepezil (ARICEPT) 5 MG tablet TAKE  ONE TABLET AT BEDTIME FOR MEMORY   estradiol (ESTRACE) 0.1 MG/GM vaginal cream APPLY A PEA SIZE AMOUNT PER VAGINA 2-3 NIGHTS A WEEK -FOR VAGINAL USE-   Lancets (ONETOUCH DELICA PLUS 123XX123) MISC CHECK BLOOD SUGAR ONCE A DAY Dx E11.69   lisinopril (ZESTRIL) 40 MG tablet Take 1 tablet (40 mg total) by mouth daily. For blood pressure   memantine (NAMENDA) 5 MG tablet Take 1 tablet (5 mg total) by mouth 2 (two) times daily. For memory   metFORMIN (GLUCOPHAGE) 1000 MG tablet Take 1 tablet (1,000 mg total) by mouth 2 (two) times daily with a meal. (NEEDS TO BE SEEN BEFORE NEXT REFILL)   mirabegron ER (MYRBETRIQ) 50 MG TB24 tablet Take 1 tablet (50 mg total) by mouth daily.   multivitamin-lutein (OCUVITE-LUTEIN) CAPS capsule Take 1 capsule by mouth daily.   nitroGLYCERIN (NITROSTAT) 0.4 MG SL tablet Place 1 tablet (0.4 mg total) under the tongue every 5 (five) minutes as needed for chest pain.   nystatin cream (MYCOSTATIN) Apply 1 application. topically 2 (two) times daily.   Omega-3 Fatty Acids (FISH OIL) 1200 MG CAPS Take by mouth.   ONETOUCH ULTRA test strip CHECK BLOOD SUGAR BID Dx E11.69   pantoprazole (PROTONIX) 40 MG tablet Take 1 tablet (40 mg total) by mouth daily.   pravastatin (PRAVACHOL) 40 MG tablet Take 1 tablet (40 mg total) by mouth daily.  sitaGLIPtin (JANUVIA) 50 MG tablet TAKE ONE TABLET ONCE DAILY FOR SUGAR.   sodium chloride (OCEAN) 0.65 % SOLN nasal spray Place 1 spray into both nostrils as needed for congestion.   amoxicillin-clavulanate (AUGMENTIN) 875-125 MG tablet Take 1 tablet by mouth 2 (two) times daily. (Patient not taking: Reported on 11/26/2022)   No facility-administered encounter medications on file as of 11/26/2022.    Allergies (verified) Codeine, Doxycycline, Other, Sulfonamide derivatives, Vancomycin, and Farxiga [dapagliflozin]   History: Past Medical History:  Diagnosis Date   Abnormality of gait 01/29/2016   Aortic root dilatation    Mild   Cataract     Diabetes mellitus without complication    type 2   Dyslipidemia    Dyspnea    with exertion   Dysrhythmia    hx of occ palpitations   Esophageal stricture    s/p dilation   GERD (gastroesophageal reflux disease)    Headache    History of hiatal hernia    Hyperlipidemia    Hypertension    Macular pucker, left eye    Osteoarthritis    Osteoporosis    Tinnitus    left ear last week or so   Past Surgical History:  Procedure Laterality Date   ABDOMINAL HYSTERECTOMY     partial   APPENDECTOMY     BACK SURGERY     lower x 1   CARDIAC CATHETERIZATION  2010   both cataracts   [place] N/A 08/28/2016   Procedure: CYSTOSCOPY;  Surgeon: Carolan Clines, MD;  Location: [facility];  Service: Urology;  Laterality: N/A;   EYE SURGERY     cataracts   EYE SURGERY     for macular pucker   FRACTURE SURGERY     rt wrist2010   HARDWARE REMOVAL  12/10/2011   Procedure: HARDWARE REMOVAL;  Surgeon: Schuyler Amor, MD;  Location: [facility];  Service: Orthopedics;  Laterality: Right;  radial head   KNEE ARTHROSCOPY  2004   Left and right   KYPHOSIS SURGERY     LUMBAR FUSION  2000,2003   RADIAL HEAD ARTHROPLASTY  5/12   orif rt radial head   SHOULDER SURGERY     Right rotator cuff epair   TOTAL KNEE ARTHROPLASTY     Left   VAGINAL PROLAPSE REPAIR N/A 08/28/2016   Procedure: ANTERIOR VAGINAL VAULT SUSPENSION WITH SACROSPINOUS FIXATION;  Surgeon: Carolan Clines, MD;  Location: [facility];  Service: Urology;  Laterality: N/A;   Family History  Problem Relation Age of Onset   Pneumonia Mother    Brain cancer Father    Tremor Father    Arthritis Sister    COPD Sister    Emphysema Brother    COPD Brother    Tremor Brother    Lung cancer Brother    Heart disease Brother    Liver cancer Brother    Diabetes Other         1 child has diabetes at age 74 and the other 28 are healthy   Social History   Socioeconomic History   Marital status:  Widowed    Spouse name: Not on file   Number of children: 5   Years of education: 5   Highest education level: Not on file  Occupational History   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    Comment: Retired  Tobacco Use   Smoking status: Never   Smokeless tobacco: Never  Media planner  Vaping Use: Never used  Substance and Sexual Activity   Alcohol use: No   Drug use: No   Sexual activity: Not Currently  Other Topics Concern   Not on file  Social History Narrative   Pt lives at home alone   Husband w/ Alzheimer's passed away   Right-handed   Drinks 1-2 cups of coffee daily   Social Determinants of Health   Financial Resource Strain: Low Risk  (11/26/2022)   Overall Financial Resource Strain (CARDIA)    Difficulty of Paying Living Expenses: Not hard at all  Food Insecurity: No Food Insecurity (11/26/2022)   Hunger Vital Sign    Worried About Running Out of Food in the Last Year: Never true    Ran Out of Food in the Last Year: Never true  Transportation Needs: No Transportation Needs (11/26/2022)   PRAPARE - Hydrologist (Medical): No    Lack of Transportation (Non-Medical): No  Physical Activity: Inactive (11/26/2022)   Exercise Vital Sign    Days of Exercise per Week: 0 days    Minutes of Exercise per Session: 0 min  Stress: No Stress Concern Present (11/26/2022)   [place]    Feeling of Stress : Not at all  Social Connections: Moderately Isolated (11/26/2022)   Social Connection and Isolation Panel [NHANES]    Frequency of Communication with Friends and Family: More than three times a week    Frequency of Social Gatherings with Friends and Family: More than three times a week    Attends Religious Services: More than 4 times per year    Active Member of [employer] or Organizations: No    Attends Archivist Meetings: Never    Marital Status: Widowed    Tobacco  Counseling Counseling given: Not Answered   Clinical Intake:  Pre-visit preparation completed: Yes  Pain : No/denies pain     Nutritional Risks: None Diabetes: Yes CBG done?: No Did pt. bring in CBG monitor from home?: No  How often do you need to have someone help you when you read instructions, pamphlets, or other written materials from your doctor or pharmacy?: 1 - Never  Diabetic?yes Nutrition Risk Assessment:  Has the patient had any N/V/D within the last 2 months?  No  Does the patient have any non-healing wounds?  No  Has the patient had any unintentional weight loss or weight gain?  No   Diabetes:  Is the patient diabetic?  Yes  If diabetic, was a CBG obtained today?  No  Did the patient bring in their glucometer from home?  No  How often do you monitor your CBG's? Once a day .   Financial Strains and Diabetes Management:  Are you having any financial strains with the device, your supplies or your medication? No .  Does the patient want to be seen by Chronic Care Management for management of their diabetes?  No  Would the patient like to be referred to a Nutritionist or for Diabetic Management?  No   Diabetic Exams:  Diabetic Eye Exam: Overdue for diabetic eye exam. Pt has been advised about the importance in completing this exam. Patient advised to call and schedule an eye exam. Diabetic Foot Exam: Overdue, Pt has been advised about the importance in completing this exam. Pt is scheduled for diabetic foot exam on next office visit .   Interpreter Needed?: No  Information entered by :: Jadene Pierini, LPN  Activities of Daily Living    11/26/2022   11:27 AM  In your present state of health, do you have any difficulty performing the following activities:  Hearing? 1  Vision? 1  Difficulty concentrating or making decisions? 1  Walking or climbing stairs? 0  Dressing or bathing? 1  Doing errands, shopping? 1  Preparing Food and eating ? Y  Using the  Toilet? N  In the past six months, have you accidently leaked urine? Y  Do you have problems with loss of bowel control? N  Managing your Medications? Y  Managing your Finances? Y  Housekeeping or managing your Housekeeping? Y    Patient Care Team: [name], DO as PCP - General (Family Medicine) Minus Breeding, MD as PCP - Cardiology (Cardiology) Irine Seal, MD as Consulting Physician (Urology) Minus Breeding, MD as Consulting Physician (Cardiology) Glenna Fellows, MD as Attending Physician (Neurosurgery) Dorene Ar, MD as Consulting Physician (Pain Medicine) Bunnie Pion, MD as Consulting Physician (Ophthalmology) Gala Romney Cristopher Estimable, MD as Consulting Physician (Gastroenterology)  Indicate any recent Medical Services you may have received from other than [facility] providers in the past year (date may be approximate).     Assessment:   This is a routine wellness examination for Madiha.  Hearing/Vision screen Vision Screening - Comments:: Referral 11/26/2022  Dietary issues and exercise activities discussed: Current Exercise Habits: The patient does not participate in regular exercise at present   Goals Addressed             This Visit's Progress    Prevent falls   On track      Depression Screen    11/26/2022   11:26 AM 09/05/2022   11:23 AM 06/19/2022    2:03 PM 05/06/2022    1:23 PM 11/11/2021    3:02 PM 09/23/2021    1:07 PM 09/06/2021   12:01 PM  PHQ 2/9 Scores  PHQ - 2 Score 0 0 1 1 0 0 0  PHQ- 9 Score   2 6 0 0 2    Fall Risk    11/26/2022   11:24 AM 09/23/2022   12:10 PM 09/05/2022   11:58 AM 09/05/2022   11:23 AM 06/19/2022    2:03 PM  Fall Risk   Falls in the past year? 0 0 0 0 0  Number falls in past yr: 0 0     Injury with Fall? 0 0     Risk for fall due to : No Fall Risks Other (Comment)     Follow up Falls prevention discussed Falls evaluation completed       [place]:  Any stairs in or  around the home? Yes  If so, are there any without handrails? No  Home free of loose throw rugs in walkways, pet beds, electrical cords, etc? Yes  Adequate lighting in your home to reduce risk of falls? Yes   ASSISTIVE DEVICES UTILIZED TO PREVENT FALLS:  Life alert? No  Use of a cane, walker or w/c? Yes  Grab bars in the bathroom? No  Shower chair or bench in shower? Yes  Elevated toilet seat or a handicapped toilet? Yes       02/04/2022    5:00 PM 02/15/2018    4:23 PM 06/15/2015   12:40 PM  MMSE - Mini Mental State Exam  Orientation to time 2 5 4   Orientation to Place 2 5 5   Registration 2 3 3   Attention/ Calculation 4  4 5  Recall 0 1 2  Language- name 2 objects 2 2 2   Language- repeat 1 1 1   Language- follow 3 step command 3 3 3   Language- read & follow direction 1 1 1   Write a sentence 1 1 1   Copy design 1 1 1   Total score 19 27 28         11/26/2022   11:27 AM 11/11/2021    3:12 PM 11/08/2020   10:55 AM 10/27/2019    9:43 AM  6CIT Screen  What Year? 0 points 0 points 0 points 0 points  What month? 0 points 0 points 0 points 0 points  What time? 3 points 0 points 0 points 0 points  Count back from 20 2 points 0 points 0 points 0 points  Months in reverse 2 points 2 points 0 points 0 points  Repeat phrase 4 points 10 points 6 points 2 points  Total Score 11 points 12 points 6 points 2 points    Immunizations Immunization History  Administered Date(s) Administered   Fluad Quad(high Dose 65+) 07/20/2019, 05/24/2021, 09/05/2022   Hepatitis B 07/02/1998, 08/06/1998, 01/07/1999   Influenza Inj Mdck Quad With Preservative 07/13/2020   Influenza Split 05/31/2010, 06/11/2011, 06/06/2013, 07/10/2021   Influenza, High Dose Seasonal PF 06/13/2016, 06/18/2017, 06/29/2018   Influenza, Seasonal, Injecte, Preservative Fre 06/29/2009, 06/04/2012   Influenza,inj,Quad PF,6+ Mos 06/15/2015   Influenza-Unspecified 06/17/1996, 06/14/1997, 06/12/1998, 05/31/1999, 06/15/2001,  06/09/2002, 06/07/2003, 04/25/2013, 05/10/2014, 06/13/2016, 06/18/2017, 06/29/2018, 07/13/2020   Moderna Sars-Covid-2 Vaccination 10/10/2019, 11/21/2019   Pneumococcal Conjugate-13 08/24/2013   Pneumococcal Polysaccharide-23 06/17/1996   Td 02/17/2017   Td (Adult), 2 Lf Tetanus Toxid, Preservative Free 02/17/2017   Tdap 02/17/2017   Zoster, Live 03/17/2014    TDAP status: Up to date  Flu Vaccine status: Up to date  Pneumococcal vaccine status: Up to date  Covid-19 vaccine status: Completed vaccines  Qualifies for Shingles Vaccine? Yes   Zostavax completed Yes   Shingrix Completed?: Yes  Screening Tests Health Maintenance  Topic Date Due   COVID-19 Vaccine (3 - 2023-24 season) 04/25/2022   OPHTHALMOLOGY EXAM  07/23/2022   FOOT EXAM  09/23/2022   Zoster Vaccines- Shingrix (1 of 2) 12/05/2022 (Originally 11/13/1978)   MAMMOGRAM  09/06/2023 (Originally 02/16/2020)   HEMOGLOBIN A1C  03/06/2023   INFLUENZA VACCINE  03/26/2023   Medicare Annual Wellness (AWV)  11/26/2023   DTaP/Tdap/Td (4 - Td or Tdap) 02/18/2027   Pneumonia Vaccine 22+ Years old  Completed   DEXA SCAN  Completed   HPV VACCINES  Aged Out    Health Maintenance  Health Maintenance Due  Topic Date Due   COVID-19 Vaccine (3 - 2023-24 season) 04/25/2022   OPHTHALMOLOGY EXAM  07/23/2022   FOOT EXAM  09/23/2022    Colorectal cancer screening: No longer required.   Mammogram status: No longer required due to age .  Bone Density status: Ordered declined . Pt provided with contact info and advised to call to schedule appt.  Lung Cancer Screening: (Low Dose CT Chest recommended if Age 44-80 years, 30 pack-year currently smoking OR have quit w/in 15years.) does not qualify.   Lung Cancer Screening Referral: n/a  Additional Screening:  Hepatitis C Screening: does not qualify;   Vision Screening: Recommended annual ophthalmology exams for early detection of glaucoma and other disorders of the eye. Is the  patient up to date with their annual eye exam?  No  Who is the provider or what is the name of the office  in which the patient attends annual eye exams? None referral 11/26/2022 If pt is not established with a provider, would they like to be referred to a provider to establish care? No .   Dental Screening: Recommended annual dental exams for proper oral hygiene  Community Resource Referral / Chronic Care Management: CRR required this visit?  No   CCM required this visit?  No      Plan:     I have personally reviewed and noted the following in the patient's chart:   Medical and social history Use of alcohol, tobacco or illicit drugs  Current medications and supplements including opioid prescriptions. Patient is not currently taking opioid prescriptions. Functional ability and status Nutritional status Physical activity Advanced directives List of other physicians Hospitalizations, surgeries, and ER visits in previous 12 months Vitals Screenings to include cognitive, depression, and falls Referrals and appointments  In addition, I have reviewed and discussed with patient certain preventive protocols, quality metrics, and best practice recommendations. A written personalized care plan for preventive services as well as general preventive health recommendations were provided to patient.     Daphane Shepherd, LPN   [id number]   Nurse Notes: none

## 2022-11-26 NOTE — Patient Instructions (Signed)
Debra Phillips , Thank you for taking time to come for your Medicare Wellness Visit. I appreciate your ongoing commitment to your health goals. Please review the following plan we discussed and let me know if I can assist you in the future.   These are the goals we discussed:  Goals      AWV     11/08/2020 AWV Goal: Fall Prevention  Over the next year, patient will decrease their risk for falls by: Using assistive devices, such as a cane or walker, as needed Identifying fall risks within their home and correcting them by: Removing throw rugs Adding handrails to stairs or ramps Removing clutter and keeping a clear pathway throughout the home Increasing light, especially at night Adding shower handles/bars Raising toilet seat Identifying potential personal risk factors for falls: Medication side effects Incontinence/urgency Vestibular dysfunction Hearing loss Musculoskeletal disorders Neurological disorders Orthostatic hypotension  11/08/2020 AWV Goal: Diabetes Management  Patient will maintain an A1C level below 8.0 Patient will not develop any diabetic foot complications Patient will not experience any hypoglycemic episodes over the next 3 months Patient will notify our office of any CBG readings outside of the provider recommended range by calling [phone] Patient will adhere to provider recommendations for diabetes management  Patient Self Management Activities take all medications as prescribed and report any negative side effects monitor and record blood sugar readings as directed adhere to a low carbohydrate diet that incorporates lean proteins, vegetables, whole grains, low glycemic fruits check feet daily noting any sores, cracks, injuries, or callous formations see PCP or podiatrist if she notices any changes in her legs, feet, or toenails Patient will visit PCP and have an A1C level checked every 3 to 6 months as directed  have a yearly eye exam to monitor for  vascular changes associated with diabetes and will request that the report be sent to her pcp.  consult with her PCP regarding any changes in her health or new or worsening symptoms      Exercise 150 min/wk Moderate Activity     Use handouts from physical therapy     Prevent falls     Prevent Falls and Injury      - always use handrails on the stairs - learn how to get back up if I fall - make an emergency alert plan in case I fall - pick up clutter from the floors - use a nonslip pad with throw rugs, or remove them completely - use a cane or walker - use a nightlight in the bathroom - wear my glasses and/or hearing aid  -Keep medical alert system on at all times         This is a list of the screening recommended for you and due dates:  Health Maintenance  Topic Date Due   COVID-19 Vaccine (3 - 2023-24 season) 04/25/2022   Eye exam for diabetics  07/23/2022   Complete foot exam   09/23/2022   Zoster (Shingles) Vaccine (1 of 2) 12/05/2022*   Mammogram  09/06/2023*   Hemoglobin A1C  03/06/2023   Flu Shot  03/26/2023   Medicare Annual Wellness Visit  11/26/2023   DTaP/Tdap/Td vaccine (4 - Td or Tdap) 02/18/2027   Pneumonia Vaccine  Completed   DEXA scan (bone density measurement)  Completed   HPV Vaccine  Aged Out  *Topic was postponed. The date shown is not the original due date.    Advanced directives: Advance directive discussed with you today. I have provided a  copy for you to complete at home and have notarized. Once this is complete please bring a copy in to our office so we can scan it into your chart.   Conditions/risks identified: Aim for 30 minutes of exercise or brisk walking, 6-8 glasses of water, and 5 servings of fruits and vegetables each day.   Next appointment: Follow up in one year for your annual wellness visit    Preventive Care 65 Years and Older, Female Preventive care refers to lifestyle choices and visits with your health care provider that can  promote health and wellness. What does preventive care include? A yearly physical exam. This is also called an annual well check. Dental exams once or twice a year. Routine eye exams. Ask your health care provider how often you should have your eyes checked. Personal lifestyle choices, including: Daily care of your teeth and gums. Regular physical activity. Eating a healthy diet. Avoiding tobacco and drug use. Limiting alcohol use. Practicing safe sex. Taking low-dose aspirin every day. Taking vitamin and mineral supplements as recommended by your health care provider. What happens during an annual well check? The services and screenings done by your health care provider during your annual well check will depend on your age, overall health, lifestyle risk factors, and family history of disease. Counseling  Your health care provider may ask you questions about your: Alcohol use. Tobacco use. Drug use. Emotional well-being. Home and relationship well-being. Sexual activity. Eating habits. History of falls. Memory and ability to understand (cognition). Work and work Statistician. Reproductive health. Screening  You may have the following tests or measurements: Height, weight, and BMI. Blood pressure. Lipid and cholesterol levels. These may be checked every 5 years, or more frequently if you are over 23 years old. Skin check. Lung cancer screening. You may have this screening every year starting at age 97 if you have a 30-pack-year history of smoking and currently smoke or have quit within the past 15 years. Fecal occult blood test (FOBT) of the stool. You may have this test every year starting at age 66. Flexible sigmoidoscopy or colonoscopy. You may have a sigmoidoscopy every 5 years or a colonoscopy every 10 years starting at age 31. Hepatitis C blood test. Hepatitis B blood test. Sexually transmitted disease (STD) testing. Diabetes screening. This is done by checking your  blood sugar (glucose) after you have not eaten for a while (fasting). You may have this done every 1-3 years. Bone density scan. This is done to screen for osteoporosis. You may have this done starting at age 65. Mammogram. This may be done every 1-2 years. Talk to your health care provider about how often you should have regular mammograms. Talk with your health care provider about your test results, treatment options, and if necessary, the need for more tests. Vaccines  Your health care provider may recommend certain vaccines, such as: Influenza vaccine. This is recommended every year. Tetanus, diphtheria, and acellular pertussis (Tdap, Td) vaccine. You may need a Td booster every 10 years. Zoster vaccine. You may need this after age 35. Pneumococcal 13-valent conjugate (PCV13) vaccine. One dose is recommended after age 77. Pneumococcal polysaccharide (PPSV23) vaccine. One dose is recommended after age 2. Talk to your health care provider about which screenings and vaccines you need and how often you need them. This information is not intended to replace advice given to you by your health care provider. Make sure you discuss any questions you have with your health care provider. Document Released:  09/07/2015 Document Revised: 04/30/2016 Document Reviewed: 06/12/2015 Elsevier Interactive Patient Education  2017 [place] Prevention in the Home Falls can cause injuries. They can happen to people of all ages. There are many things you can do to make your home safe and to help prevent falls. What can I do on the outside of my home? Regularly fix the edges of walkways and driveways and fix any cracks. Remove anything that might make you trip as you walk through a door, such as a raised step or threshold. Trim any bushes or trees on the path to your home. Use bright outdoor lighting. Clear any walking paths of anything that might make someone trip, such as rocks or tools. Regularly  check to see if handrails are loose or broken. Make sure that both sides of any steps have handrails. Any raised decks and porches should have guardrails on the edges. Have any leaves, snow, or ice cleared regularly. Use sand or salt on walking paths during winter. Clean up any spills in your garage right away. This includes oil or grease spills. What can I do in the bathroom? Use night lights. Install grab bars by the toilet and in the tub and shower. Do not use towel bars as grab bars. Use non-skid mats or decals in the tub or shower. If you need to sit down in the shower, use a plastic, non-slip stool. Keep the floor dry. Clean up any water that spills on the floor as soon as it happens. Remove soap buildup in the tub or shower regularly. Attach bath mats securely with double-sided non-slip rug tape. Do not have throw rugs and other things on the floor that can make you trip. What can I do in the bedroom? Use night lights. Make sure that you have a light by your bed that is easy to reach. Do not use any sheets or blankets that are too big for your bed. They should not hang down onto the floor. Have a firm chair that has side arms. You can use this for support while you get dressed. Do not have throw rugs and other things on the floor that can make you trip. What can I do in the kitchen? Clean up any spills right away. Avoid walking on wet floors. Keep items that you use a lot in easy-to-reach places. If you need to reach something above you, use a strong step stool that has a grab bar. Keep electrical cords out of the way. Do not use floor polish or wax that makes floors slippery. If you must use wax, use non-skid floor wax. Do not have throw rugs and other things on the floor that can make you trip. What can I do with my stairs? Do not leave any items on the stairs. Make sure that there are handrails on both sides of the stairs and use them. Fix handrails that are broken or loose.  Make sure that handrails are as long as the stairways. Check any carpeting to make sure that it is firmly attached to the stairs. Fix any carpet that is loose or worn. Avoid having throw rugs at the top or bottom of the stairs. If you do have throw rugs, attach them to the floor with carpet tape. Make sure that you have a light switch at the top of the stairs and the bottom of the stairs. If you do not have them, ask someone to add them for you. What else can I do to help prevent  falls? Wear shoes that: Do not have high heels. Have rubber bottoms. Are comfortable and fit you well. Are closed at the toe. Do not wear sandals. If you use a stepladder: Make sure that it is fully opened. Do not climb a closed stepladder. Make sure that both sides of the stepladder are locked into place. Ask someone to hold it for you, if possible. Clearly mark and make sure that you can see: Any grab bars or handrails. First and last steps. Where the edge of each step is. Use tools that help you move around (mobility aids) if they are needed. These include: Canes. Walkers. Scooters. Crutches. Turn on the lights when you go into a dark area. Replace any light bulbs as soon as they burn out. Set up your furniture so you have a clear path. Avoid moving your furniture around. If any of your floors are uneven, fix them. If there are any pets around you, be aware of where they are. Review your medicines with your doctor. Some medicines can make you feel dizzy. This can increase your chance of falling. Ask your doctor what other things that you can do to help prevent falls. This information is not intended to replace advice given to you by your health care provider. Make sure you discuss any questions you have with your health care provider. Document Released: 06/07/2009 Document Revised: 01/17/2016 Document Reviewed: 09/15/2014 Elsevier Interactive Patient Education  2017 [employer].

## 2022-12-01 ENCOUNTER — Ambulatory Visit (HOSPITAL_COMMUNITY)
Admission: RE | Admit: 2022-12-01 | Discharge: 2022-12-01 | Disposition: A | Discharge status: Home / Self Care | Payer: Medicare Other | Source: Ambulatory Visit | Attending: Family | Admitting: Family

## 2022-12-01 ENCOUNTER — Ambulatory Visit (INDEPENDENT_AMBULATORY_CARE_PROVIDER_SITE_OTHER): Payer: Medicare Other | Admitting: Family

## 2022-12-01 ENCOUNTER — Encounter: Payer: Self-pay | Admitting: Family

## 2022-12-01 VITALS — BP 129/73 | HR 78 | Temp 97.6°F | Ht 62.0 in | Wt 158.0 lb

## 2022-12-01 DIAGNOSIS — W19XXXA Unspecified fall, initial encounter: Secondary | ICD-10-CM

## 2022-12-01 DIAGNOSIS — R4182 Altered mental status, unspecified: Secondary | ICD-10-CM | POA: Insufficient documentation

## 2022-12-01 DIAGNOSIS — Y92009 Unspecified place in unspecified non-institutional (private) residence as the place of occurrence of the external cause: Secondary | ICD-10-CM | POA: Insufficient documentation

## 2022-12-01 DIAGNOSIS — R519 Headache, unspecified: Secondary | ICD-10-CM | POA: Diagnosis not present

## 2022-12-01 DIAGNOSIS — S060X0A Concussion without loss of consciousness, initial encounter: Secondary | ICD-10-CM

## 2022-12-01 DIAGNOSIS — R9082 White matter disease, unspecified: Secondary | ICD-10-CM | POA: Insufficient documentation

## 2022-12-01 NOTE — Patient Instructions (Signed)
Concussion, Adult  A concussion is a brain injury from a hard, direct hit (trauma) to the head or body. This direct hit causes the brain to shake quickly back and forth inside the skull. This can damage brain cells and cause chemical changes in the brain. A concussion may also be known as a mild traumatic brain injury (TBI). The effects of a concussion can be serious. If you have a concussion, you should be very careful to avoid having a second concussion. What are the causes? This condition is caused by: A direct hit to your head. Sudden movement of your body that causes your brain to move back and forth inside the skull, such as in a car crash. What are the signs or symptoms? The signs of a concussion can be hard to notice. Early on, they may be missed by you, family members, and health care providers. You may look fine on the outside but may act or feel differently. Every head injury is different. Symptoms are usually temporary but may last for days, weeks, or even months. Some symptoms appear right away, but other symptoms may not show up for hours or days. Physical symptoms Headaches. Dizziness and problems with coordination or balance. Sensitivity to light or noise. Nausea or vomiting. Tiredness (fatigue). Vision or hearing problems. Seizure. Mental and emotional symptoms Irritability or mood changes. Memory problems. Trouble concentrating, organizing, or making decisions. Changes in eating or sleeping patterns. Slowness in thinking, acting or reacting, speaking, or reading. Anxiety or depression. How is this diagnosed? This condition is diagnosed based on your symptoms and injury. You may also have tests, including: Imaging tests, such as a CT scan or an MRI. Neuropsychological tests. These measure your thinking, understanding, learning, and memory. How is this treated? Treatment for this condition includes: Stopping sports or activity if you are injured. Physical and mental  rest and careful observation, usually at home. Medicines to help with symptoms such as headaches, nausea, or difficulty sleeping. Referral to a concussion clinic or rehab center. Follow these instructions at home: Activity Limit activities that require a lot of thought or concentration, such as: Doing homework or job-related work. Watching TV. Using the computer or phone. Playing memory games and doing puzzles. Rest helps your brain heal. Make sure you: Get plenty of sleep. Most adults should get 7-9 hours of sleep each night. Rest during the day. Take naps or rest breaks when you feel tired. Avoid high-intensity exercise or physical activities that take a lot of effort. Stop any activity that worsens symptoms. Your health care provider may recommend light exercise such as walking. Do not do high-risk activities that could cause a second concussion, such as riding a bike or playing sports. Ask your health care provider when you can return to your normal activities, such as school, work, sports, and driving. Your ability to react may be slower after a brain injury. Never do these activities if you are dizzy. General instructions  Take over-the-counter and prescription medicines only as told by your health care provider. Some medicines, such as blood thinners (anticoagulants) and aspirin, may increase the risk for complications, such as bleeding. Avoid taking opioid pain medicine while recovering from a concussion. Do not drink alcohol until your health care provider says you can. Drinking alcohol may slow your recovery and can put you at risk of further injury. Watch your symptoms and tell others around you to do the same. Complications sometimes occur after a concussion. Tell your work manager, teachers, school nurse,   school counselor, coach, or sports trainer about your injury, symptoms, and restrictions. See a mental health therapist if you feel anxious or depressed. Managing this condition  can be challenging. Keep all follow-up visits. Your health care provider will check on your recovery and give you a plan for returning to activities. How is this prevented? Avoiding another brain injury is very important. In rare cases, another injury can lead to permanent brain damage, brain swelling, or death. The risk of this is greatest during the first 7-10 days after a head injury. Avoid injuries by: Stopping activities that could lead to a second concussion, such as contact or recreational sports, until your health care provider says it is okay. Taking these actions once you have returned to sports or activities: Avoid plays or moves that can cause you to crash into another person. This is how most concussions occur. Follow the rules and be respectful of other players. Do not engage in violent or illegal plays. Getting regular exercise that includes strength and balance training. Wearing a properly fitting helmet during sports, biking, or other activities. Helmets can help protect you from serious skull and brain injuries, but they may not protect you from a concussion. Even when wearing a helmet, you should avoid being hit in the head. Where to find more information Centers for Disease Control and Prevention: cdc.gov Contact a health care provider if: Your symptoms do not improve or get worse. You have new symptoms. You have another injury. Your coordination gets worse. You have unusual behavior changes. Get help right away if: You have a severe or worsening headache. You have weakness or numbness in any part of your body, slurred speech, vision changes, or confusion. You vomit repeatedly. You lose consciousness, are sleepier than normal, or are difficult to wake up. You have a seizure. These symptoms may be an emergency. Get help right away. Call 911. Do not wait to see if the symptoms will go away. Do not drive yourself to the hospital. Also, get help right away if: You have  thoughts of hurting yourself or others. Take one of these steps if you feel like you may hurt yourself or others, or have thoughts about taking your own life: Go to your nearest emergency room. Call 911. Call the National Suicide Prevention Lifeline at 1-800-273-8255 or 988. This is open 24 hours a day. Text the Crisis Text Line at 741741. This information is not intended to replace advice given to you by your health care provider. Make sure you discuss any questions you have with your health care provider. Document Revised: 01/03/2022 Document Reviewed: 01/03/2022 Elsevier Patient Education  2023 [employer] Inc[employer]  

## 2022-12-01 NOTE — Progress Notes (Signed)
Subjective:    Patient ID: Debra Phillips, female    DOB: 09/04/1928, 89 y.o.   MRN: [id number]  Chief Complaint  Patient presents with   Fall    Hit head on Friday in bath tube Friday no symptoms other than soreness and a knot on head    Pt presents to the office today after a fall on Friday afternoon. She had just got up from the toilet she fell backwards and hit back of her head. Denies any  changes in vision, gait,  speech, or memory. Does report headache a knot on back of head.   She does take a aspirin 81 mg.  Fall The accident occurred 3 to 5 days ago. The fall occurred in unknown circumstances. She landed on Hard floor. There was no blood loss. The point of impact was the head. Pain location: left thumb and back. The pain is at a severity of 3/10. The pain is mild. Associated symptoms include headaches. Pertinent negatives include no fever, hearing loss, hematuria, loss of consciousness, nausea, numbness, tingling, visual change or vomiting. She has tried acetaminophen for the symptoms. The treatment provided mild relief.      Review of Systems  Constitutional:  Negative for fever.  Gastrointestinal:  Negative for nausea and vomiting.  Genitourinary:  Negative for hematuria.  Neurological:  Positive for headaches. Negative for tingling, loss of consciousness and numbness.  All other systems reviewed and are negative.      Objective:   Physical Exam Vitals reviewed.  Constitutional:      General: She is not in acute distress.    Appearance: She is well-developed. She is obese.  HENT:     Head: Normocephalic and atraumatic.     Right Ear: Tympanic membrane and external ear normal.     Left Ear: Tympanic membrane normal.  Eyes:     Pupils: Pupils are equal, round, and reactive to light.  Neck:     Thyroid: No thyromegaly.  Cardiovascular:     Rate and Rhythm: Normal rate and regular rhythm.     Heart sounds: Normal heart sounds. No murmur heard. Pulmonary:      Effort: Pulmonary effort is normal. No respiratory distress.     Breath sounds: Normal breath sounds. No wheezing.  Abdominal:     General: Bowel sounds are normal. There is no distension.     Palpations: Abdomen is soft.     Tenderness: There is no abdominal tenderness.  Musculoskeletal:        General: No tenderness. Normal range of motion.     Cervical back: Normal range of motion and neck supple.     Right lower leg: Edema (trace) present.     Left lower leg: Edema (trace) present.  Skin:    General: Skin is warm and dry.  Neurological:     Mental Status: She is alert and oriented to person, place, and time.     Cranial Nerves: No cranial nerve deficit.     Motor: Weakness (using cane) present.     Deep Tendon Reflexes: Reflexes are normal and symmetric.  Psychiatric:        Behavior: Behavior normal.        Thought Content: Thought content normal.        Judgment: Judgment normal.      BP 129/73   Pulse 78   Temp 97.6 F (36.4 C) (Temporal)   Ht 5\' 2"  (1.575 m)   Wt 158 lb (71.7 kg)  SpO2 99%   BMI 28.90 kg/m       Assessment & Plan:   Debra Phillips comes in today with chief complaint of Fall (Hit head on Friday in bath tube Friday no symptoms other than soreness and a knot on head )   Diagnosis and orders addressed:  1. Fall in home, initial encounter - CT HEAD WO CONTRAST ( ); Future  2. Acute intractable headache, unspecified headache type - CT HEAD WO CONTRAST ( ); Future  3. Concussion without loss of consciousness, initial encounter - CT HEAD WO CONTRAST ( ); Future  Long discussion with patient and family. She is out of the window of critical bleed with normal Neuro exam today. However, given her age, use of aspirin would be ok with ordering CT head. Pt states she would rather have scan today as she has been very nervous and not feeling "right".  Discussed concussion symptoms, rest and avoid over stimulation.  Health Maintenance  reviewed Diet and exercise encouraged  Follow up plan: Keep follow up with PCP  CT head- Negative  Approx 45 mins spent with patient, family, chart review, and educating.    Jannifer Rodney, FNP

## 2022-12-02 ENCOUNTER — Other Ambulatory Visit: Payer: Self-pay | Admitting: Family Medicine

## 2022-12-02 DIAGNOSIS — E1169 Type 2 diabetes mellitus with other specified complication: Secondary | ICD-10-CM

## 2022-12-04 ENCOUNTER — Emergency Department (HOSPITAL_COMMUNITY)
Admission: EM | Admit: 2022-12-04 | Discharge: 2022-12-04 | Disposition: A | Discharge status: Home / Self Care | Payer: Medicare Other | Attending: Student | Admitting: Student

## 2022-12-04 ENCOUNTER — Encounter (HOSPITAL_COMMUNITY): Payer: Self-pay

## 2022-12-04 ENCOUNTER — Emergency Department (HOSPITAL_COMMUNITY): Payer: Medicare Other

## 2022-12-04 ENCOUNTER — Other Ambulatory Visit: Payer: Self-pay

## 2022-12-04 DIAGNOSIS — W19XXXA Unspecified fall, initial encounter: Secondary | ICD-10-CM | POA: Diagnosis not present

## 2022-12-04 DIAGNOSIS — Z7982 Long term (current) use of aspirin: Secondary | ICD-10-CM | POA: Diagnosis not present

## 2022-12-04 DIAGNOSIS — M79672 Pain in left foot: Secondary | ICD-10-CM

## 2022-12-04 NOTE — ED Provider Notes (Signed)
[place] EMERGENCY DEPARTMENT AT [facility] Provider Note   CSN: [id number] Arrival date & time: 12/04/22  1325     History Chief Complaint  Patient presents with   Fall   Foot Injury    Debra Phillips is a 89 y.o. female complains of left foot pain and swelling after a fall that occurred several days ago.  Patient was initially seen evaluated after the fall and had CT imaging done of the head she did hit her head and there was some swelling there as well.  Over the last couple of days she has noticed increased level of foot pain on the left side and noticed that she had some ecchymosis there which prompted her arrival today for an x-ray.  She denies any other additional injuries.  She denies loss of consciousness, nausea, vomiting, blurred vision.   Fall  Foot Injury      Home Medications Prior to Admission medications   Medication Sig Start Date End Date Taking? Authorizing Provider  Artificial Saliva (BIOTENE DRY MOUTH MOISTURIZING) SOLN Use as directed 5 mLs in the mouth or throat at bedtime. 02/04/22   Raliegh Ip, DO  aspirin EC 81 MG tablet Take 81 mg by mouth daily.    [provider]  benzonatate (TESSALON) 200 MG capsule Take 1 capsule (200 mg total) by mouth 3 (three) times daily as needed. 09/23/22   Jannifer Rodney A, FNP  Cholecalciferol (VITAMIN D3) 10 MCG (400 UNIT) CAPS Take by mouth.    [provider]  diltiazem (CARDIZEM CD) 180 MG 24 hr capsule TAKE (1) CAPSULE DAILY for blood pressure 02/04/22   Gottschalk, Ashly M, DO  donepezil (ARICEPT) 5 MG tablet TAKE ONE TABLET AT BEDTIME FOR MEMORY 09/05/22   Delynn Flavin M, DO  estradiol (ESTRACE) 0.1 MG/GM vaginal cream APPLY A PEA SIZE AMOUNT PER VAGINA 2-3 NIGHTS A WEEK -FOR VAGINAL USE- 11/03/22   Delynn Flavin M, DO  Lancets [facility] DELICA PLUS [place]) MISC CHECK BLOOD SUGAR ONCE A DAY Dx E11.69 09/20/21   Delynn Flavin M, DO  lisinopril (ZESTRIL) 40 MG tablet  Take 1 tablet (40 mg total) by mouth daily. For blood pressure 02/04/22   Gottschalk, Ashly M, DO  memantine (NAMENDA) 5 MG tablet Take 1 tablet (5 mg total) by mouth 2 (two) times daily. For memory 05/06/22   Raliegh Ip, DO  metFORMIN (GLUCOPHAGE) 1000 MG tablet TAKE (1) TABLET TWICE A DAY WITH MEALS (BREAKFAST AND SUPPER) needs TO be seen BEFORE NEXT refill 12/02/22   Delynn Flavin M, DO  mirabegron ER (MYRBETRIQ) 50 MG TB24 tablet Take 1 tablet (50 mg total) by mouth daily. 03/18/22   Gwenlyn Fudge, FNP  multivitamin-lutein [facility]) CAPS capsule Take 1 capsule by mouth daily.    [provider]  nitroGLYCERIN (NITROSTAT) 0.4 MG SL tablet Place 1 tablet (0.4 mg total) under the tongue every 5 (five) minutes as needed for chest pain. 11/02/15   Rollene Rotunda, MD  nystatin cream (MYCOSTATIN) Apply 1 application. topically 2 (two) times daily. 01/17/22   Gabriel Earing, FNP  Omega-3 Fatty Acids (FISH OIL) 1200 MG CAPS Take by mouth.    [provider]  Koren Bound test strip CHECK BLOOD SUGAR BID Dx E11.69 03/07/22   Delynn Flavin M, DO  pantoprazole (PROTONIX) 40 MG tablet Take 1 tablet (40 mg total) by mouth daily. 09/05/22   Raliegh Ip, DO  pravastatin (PRAVACHOL) 40 MG tablet Take 1 tablet (  40 mg total) by mouth daily. 09/05/22   Raliegh Ip, DO  sitaGLIPtin (JANUVIA) 50 MG tablet TAKE ONE TABLET ONCE DAILY FOR SUGAR. 09/05/22   Delynn Flavin M, DO  sodium chloride (OCEAN) 0.65 % SOLN nasal spray Place 1 spray into both nostrils as needed for congestion. 10/16/20   Daryll Drown, NP      Allergies    Codeine, Doxycycline, Other, Sulfonamide derivatives, Vancomycin, and Farxiga [dapagliflozin]    Review of Systems   Review of Systems  All other systems reviewed and are negative.   Physical Exam Updated Vital Signs BP (!) 146/77 (BP Location: Right Arm)   Pulse 79   Temp 98.1 F (36.7 C) (Oral)   Resp 16   Ht 5\' 4"   (1.626 m)   Wt 68 kg   SpO2 98%   BMI 25.75 kg/m  Physical Exam Vitals and nursing note reviewed.  Constitutional:      Appearance: Normal appearance.  HENT:     Head: Normocephalic and atraumatic.  Eyes:     General:        Right eye: No discharge.        Left eye: No discharge.     Conjunctiva/sclera: Conjunctivae normal.  Pulmonary:     Effort: Pulmonary effort is normal.  Musculoskeletal:     Comments: Left foot is diffusely swollen with some ecchymosis of various stages of healing.  Strong 2+ dorsalis pedis pulse felt on the left.  Good range of motion.  Neurovascularly intact.  Skin:    General: Skin is warm and dry.     Findings: No rash.  Neurological:     General: No focal deficit present.     Mental Status: She is alert.  Psychiatric:        Mood and Affect: Mood normal.        Behavior: Behavior normal.     ED Results / Procedures / Treatments   Labs (all labs ordered are listed, but only abnormal results are displayed) Labs Reviewed - No data to display  EKG None  Radiology DG Foot Complete Left  Result Date: 12/04/2022 CLINICAL DATA:  Left foot pain after fall. EXAM: LEFT FOOT - COMPLETE 3+ VIEW COMPARISON:  None Available. FINDINGS: There is no evidence of fracture or dislocation. There is no evidence of arthropathy or other focal bone abnormality. Soft tissues are unremarkable. IMPRESSION: Negative. Electronically Signed   By: Lupita Raider M.D.   On: 12/04/2022 14:30    Procedures Procedures    Medications Ordered in ED Medications - No data to display  ED Course/ Medical Decision Making/ A&P Clinical Course as of 12/04/22 1518  Thu Dec 04, 2022  1508 DG Foot Complete Left I personally ordered and interpreted the study and do not see any evidence of fractures or dislocations.  Likely soft tissue injury and sprain considering the swelling and ecchymosis surrounding the foot and ankle. [CF]    Clinical Course User Index [CF] Teressa Lower, PA-C   {   Click here for ABCD2, HEART and other calculators  Medical Decision Making Debra Phillips is a 89 y.o. female patient who presents to the emergency department today for further evaluation of left foot pain.  I have a low suspicion that this is a fractures location since she has been able to ambulate on this.  Has been ongoing for several days.  Will get an x-ray to further assess.  Patient is not anticoagulated.  I  doubt DVT.  As highlighted in the ED course, x-ray was normal.  I think the soft tissue or ligamentous sprain.  I will instruct her on appropriate analgesics and will have her follow-up with her primary care doctor.  Strict return precautions were discussed.  She is safe for discharge.  Amount and/or Complexity of Data Reviewed Radiology: ordered. Decision-making details documented in ED Course.    Final Clinical Impression(s) / ED Diagnoses Final diagnoses:  Fall, initial encounter  Left foot pain    Rx / DC Orders ED Discharge Orders     None         Jolyn LentFleming, Miaisabella Bacorn M, PA-C 12/04/22 1518    Rondel BatonPaterson, Robert C, MD 12/05/22 [phone]

## 2022-12-04 NOTE — ED Triage Notes (Signed)
[name] last Friday, had a CT scan of the head this past Monday. Today she is here d/t left foot pain, and swelling, daughter states she normally has swelling to her feet and legs.

## 2022-12-04 NOTE — Discharge Instructions (Signed)
Please take 600 mg of ibuprofen every 6 hours as needed for pain and swelling.  You may also add on Tylenol at the 3 to 4-hour mark.  I would like for you to follow-up with your primary care doctor for further evaluation.  Return to the emergency room for any worsening symptoms you might have.

## 2022-12-10 ENCOUNTER — Telehealth: Payer: Self-pay | Admitting: *Deleted

## 2022-12-10 NOTE — Telephone Encounter (Signed)
        Patient  visited [place] on 12/04/2022  for treat ment   Telephone encounter attempt :  1st  A HIPAA compliant voice message was left requesting a return call.  Instructed patient to call back at [phone].  Yehuda Mao [facility] -[facility], Population Health [phone] [address] [place] , [place] [address] Email : [email or web address]

## 2022-12-12 ENCOUNTER — Telehealth: Payer: Self-pay | Admitting: *Deleted

## 2022-12-12 NOTE — Telephone Encounter (Signed)
        Patient  visited [place] on 12/07/2022  for treatment   Telephone encounter attempt :  2nd  A HIPAA compliant voice message was left requesting a return call.  Instructed patient to call back at [phone].  Yehuda Mao [facility] -[facility] [place], Population Health [phone] [address] [place] , [place] [address] Email : [email or web address]

## 2023-01-02 ENCOUNTER — Ambulatory Visit: Payer: Medicare Other | Admitting: Family Medicine

## 2023-01-16 ENCOUNTER — Telehealth: Payer: Self-pay | Admitting: Family Medicine

## 2023-01-16 NOTE — Telephone Encounter (Signed)
  Incoming Patient Call  01/16/2023  What symptoms do you have? Been sick on her stomach for a few weeks. Real nauseated.When she eats she has diarrhea. Patient can't do video and wants appt for next week.  How long have you been sick? Few weeks  Have you been seen for this problem? NO  If your provider decides to give you a prescription, which pharmacy would you like for it to be sent to? [facility]  Patient informed that this information will be sent to the clinical staff for review and that they should receive a follow up call.

## 2023-01-16 NOTE — Telephone Encounter (Signed)
Spoke with Debra Phillips (dtr) Pt scheduled for DOD 6/4

## 2023-01-16 NOTE — Telephone Encounter (Signed)
I have nothing available next week but perhaps DOD does?  Otherwise will need to be put on cancellation list.

## 2023-01-27 ENCOUNTER — Other Ambulatory Visit: Payer: Self-pay | Admitting: Family Medicine

## 2023-01-27 ENCOUNTER — Ambulatory Visit: Payer: Medicare Other | Admitting: Family Medicine

## 2023-01-27 ENCOUNTER — Encounter: Payer: Self-pay | Admitting: Family Medicine

## 2023-01-27 DIAGNOSIS — E1159 Type 2 diabetes mellitus with other circulatory complications: Secondary | ICD-10-CM

## 2023-02-04 ENCOUNTER — Other Ambulatory Visit: Payer: Self-pay | Admitting: Family Medicine

## 2023-03-03 ENCOUNTER — Other Ambulatory Visit: Payer: Self-pay | Admitting: Family Medicine

## 2023-03-03 DIAGNOSIS — E1169 Type 2 diabetes mellitus with other specified complication: Secondary | ICD-10-CM

## 2023-03-10 DIAGNOSIS — H04123 Dry eye syndrome of bilateral lacrimal glands: Secondary | ICD-10-CM | POA: Diagnosis not present

## 2023-03-16 ENCOUNTER — Ambulatory Visit: Payer: Medicare Other | Admitting: Family Medicine

## 2023-03-16 ENCOUNTER — Encounter: Payer: Self-pay | Admitting: Family Medicine

## 2023-03-16 VITALS — BP 154/78 | HR 73 | Temp 97.6°F | Ht 64.0 in | Wt 155.0 lb

## 2023-03-16 DIAGNOSIS — Z7984 Long term (current) use of oral hypoglycemic drugs: Secondary | ICD-10-CM

## 2023-03-16 DIAGNOSIS — R296 Repeated falls: Secondary | ICD-10-CM

## 2023-03-16 DIAGNOSIS — E785 Hyperlipidemia, unspecified: Secondary | ICD-10-CM | POA: Diagnosis not present

## 2023-03-16 DIAGNOSIS — I152 Hypertension secondary to endocrine disorders: Secondary | ICD-10-CM | POA: Diagnosis not present

## 2023-03-16 DIAGNOSIS — N3281 Overactive bladder: Secondary | ICD-10-CM | POA: Diagnosis not present

## 2023-03-16 DIAGNOSIS — E1159 Type 2 diabetes mellitus with other circulatory complications: Secondary | ICD-10-CM | POA: Diagnosis not present

## 2023-03-16 DIAGNOSIS — E1169 Type 2 diabetes mellitus with other specified complication: Secondary | ICD-10-CM

## 2023-03-16 LAB — BAYER DCA HB A1C WAIVED: HB A1C (BAYER DCA - WAIVED): 8 % — ABNORMAL HIGH (ref 4.8–5.6)

## 2023-03-16 MED ORDER — PANTOPRAZOLE SODIUM 40 MG PO TBEC: 40.0000 mg | DELAYED_RELEASE_TABLET | Freq: Every day | ORAL | 3 refills | Status: DC

## 2023-03-16 MED ORDER — PRAVASTATIN SODIUM 40 MG PO TABS: 40.0000 mg | ORAL_TABLET | Freq: Every day | ORAL | 3 refills | Status: DC

## 2023-03-16 MED ORDER — NITROGLYCERIN 0.4 MG SL SUBL: 0.4000 mg | SUBLINGUAL_TABLET | SUBLINGUAL | 3 refills | Status: AC | PRN

## 2023-03-16 MED ORDER — METFORMIN HCL 1000 MG PO TABS
1000.0000 mg | ORAL_TABLET | Freq: Two times a day (BID) | ORAL | 3 refills | Status: DC
Start: 2023-03-16 — End: 2023-11-16

## 2023-03-16 MED ORDER — SITAGLIPTIN PHOSPHATE 50 MG PO TABS: ORAL_TABLET | ORAL | 3 refills | Status: DC

## 2023-03-16 MED ORDER — MIRABEGRON ER 50 MG PO TB24
50.0000 mg | ORAL_TABLET | Freq: Every day | ORAL | 3 refills | Status: DC
Start: 2023-03-16 — End: 2023-11-16

## 2023-03-16 NOTE — Progress Notes (Signed)
Subjective: CC:Dm PCP: Raliegh Ip, DO GNF:AOZHYQ M Corbridge is a 89 y.o. female presenting to clinic today for:  1. Type 2 Diabetes with hypertension, hyperlipidemia:  She is in today's visit by her daughter.  The patient does not follow a diet.  She has been only taking 1000 mg of metformin daily and has been compliant with the remainder of her medications as prescribed.  She has a glucometer at home but they are not quite sure that she checks her blood sugar nor quite sure if she is taking her medications as she is supposed to.  She has memory issues but was having nightmares and complications surrounding use of Aricept and memantine so they have discontinued the medications.  Her daughter tries to prepare several weeks of medications and a med Dance movement psychotherapist but patient takes random days so again she has trouble keeping up with what she is taking.  No reports of chest pain or shortness of breath.  She has visual problems at baseline.  She suffers from frequent falls  Last eye exam: needs Last foot exam: needs Last A1c:  Lab Results  Component Value Date   HGBA1C 8.0 (H) 03/16/2023   Nephropathy screen indicated?: UTD Last flu, zoster and/or pneumovax:  Immunization History  Administered Date(s) Administered   Fluad Quad(high Dose 65+) 07/20/2019, 05/24/2021, 09/05/2022   Hepatitis B 07/02/1998, 08/06/1998, 01/07/1999   Influenza Inj Mdck Quad With Preservative 07/13/2020   Influenza Split 05/31/2010, 06/11/2011, 06/06/2013, 07/10/2021   Influenza, High Dose Seasonal PF 06/13/2016, 06/18/2017, 06/29/2018   Influenza, Seasonal, Injecte, Preservative Fre 06/29/2009, 06/04/2012   Influenza,inj,Quad PF,6+ Mos 06/15/2015   Influenza-Unspecified 06/17/1996, 06/14/1997, 06/12/1998, 05/31/1999, 06/15/2001, 06/09/2002, 06/07/2003, 04/25/2013, 05/10/2014, 06/15/2015, 06/13/2016, 06/18/2017, 06/29/2018, 07/13/2020   Moderna Sars-Covid-2 Vaccination 10/10/2019, 11/21/2019   Pneumococcal  Conjugate-13 08/24/2013   Pneumococcal Polysaccharide-23 06/17/1996   Td 02/17/2017   Td (Adult), 2 Lf Tetanus Toxid, Preservative Free 02/17/2017   Tdap 02/17/2017   Zoster, Live 03/17/2014    ROS: Per HPI  Allergies  Allergen Reactions   Codeine Nausea And Vomiting and Other (See Comments)   Doxycycline Hives   Other Nausea Only    Almost all antibiotics   Sulfonamide Derivatives Hives   Vancomycin Itching   Farxiga [Dapagliflozin]     Recurrent UTIs   Past Medical History:  Diagnosis Date   Abnormality of gait 01/29/2016   Aortic root dilatation (HCC)    Mild   Cataract    Diabetes mellitus without complication (HCC)    type 2   Dyslipidemia    Dyspnea    with exertion   Dysrhythmia    hx of occ palpitations   Esophageal stricture    s/p dilation   GERD (gastroesophageal reflux disease)    Headache    History of hiatal hernia    Hyperlipidemia    Hypertension    Macular pucker, left eye    Osteoarthritis    Osteoporosis    Tinnitus    left ear last week or so    Current Outpatient Medications:    Artificial Saliva (BIOTENE DRY MOUTH MOISTURIZING) SOLN, Use as directed 5 mLs in the mouth or throat at bedtime., Disp: 44.3 mL, Rfl: 1   aspirin EC 81 MG tablet, Take 81 mg by mouth daily., Disp: , Rfl:    Cholecalciferol (VITAMIN D3) 10 MCG (400 UNIT) CAPS, Take by mouth., Disp: , Rfl:    diltiazem (CARDIZEM CD) 180 MG 24 hr capsule, TAKE ONE CAPSULE ONCE DAILY FOR  BLOOD PRESSURE, Disp: 90 capsule, Rfl: 3   estradiol (ESTRACE) 0.1 MG/GM vaginal cream, APPLY A PEA SIZE AMOUNT PER VAGINA 2-3 NIGHTS A WEEK -FOR VAGINAL USE-, Disp: 42.5 g, Rfl: 6   Lancets (ONETOUCH DELICA PLUS LANCET33G) MISC, CHECK BLOOD SUGAR ONCE A DAY Dx E11.69, Disp: 100 each, Rfl: 4   lisinopril (ZESTRIL) 40 MG tablet, TAKE ONE TABLET ONCE DAILY FOR BLOOD PRESSURE, Disp: 90 tablet, Rfl: 3   multivitamin-lutein (OCUVITE-LUTEIN) CAPS capsule, Take 1 capsule by mouth daily., Disp: , Rfl:     ONETOUCH ULTRA test strip, CHECK BLOOD SUGAR BID Dx E11.69, Disp: 100 strip, Rfl: 4   sodium chloride (OCEAN) 0.65 % SOLN nasal spray, Place 1 spray into both nostrils as needed for congestion., Disp: 30 mL, Rfl: 0   metFORMIN (GLUCOPHAGE) 1000 MG tablet, Take 1 tablet (1,000 mg total) by mouth 2 (two) times daily with a meal., Disp: 180 tablet, Rfl: 3   mirabegron ER (MYRBETRIQ) 50 MG TB24 tablet, Take 1 tablet (50 mg total) by mouth daily., Disp: 90 tablet, Rfl: 3   nitroGLYCERIN (NITROSTAT) 0.4 MG SL tablet, Place 1 tablet (0.4 mg total) under the tongue every 5 (five) minutes as needed for chest pain., Disp: 25 tablet, Rfl: 3   Omega-3 Fatty Acids (FISH OIL) 1200 MG CAPS, Take by mouth. 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    Comment: Retired  Tobacco Use   Smoking status: Never   Smokeless tobacco: Never  Vaping Use   Vaping status: Never Used  Substance and Sexual Activity   Alcohol use: No   Drug use: No   Sexual activity: Not Currently  Other Topics Concern   Not on file  Social History Narrative   Pt lives at home alone   Husband w/ Alzheimer's passed away   Right-handed   Drinks 1-2 cups of coffee daily   Social Determinants of Health   Financial Resource Strain: Low Risk  (11/26/2022)   Overall Financial Resource Strain (CARDIA)    Difficulty of Paying Living Expenses: Not hard  at all  Food Insecurity: No Food Insecurity (11/26/2022)   Hunger Vital Sign    Worried About Running Out of Food in the Last Year: Never true    Ran Out of Food in the Last Year: Never true  Transportation Needs: No Transportation Needs (11/26/2022)   PRAPARE - Administrator, Civil Service (Medical): No    Lack of Transportation (Non-Medical): No  Physical Activity: Inactive (11/26/2022)   Exercise Vital Sign    Days of Exercise per Week: 0 days    Minutes of Exercise per Session: 0 min  Stress: No Stress Concern Present (11/26/2022)   [employer] of Occupational Health - Occupational Stress Questionnaire    Feeling of Stress : Not at all  Social Connections: Moderately Isolated (11/26/2022)   Social Connection and Isolation Panel [NHANES]    Frequency of Communication with Friends and Family: More than three times a week    Frequency of Social Gatherings with Friends and Family: More than three times a week    Attends Religious Services: More than 4 times per year    Active Member of [employer] or Organizations: No    Attends Banker Meetings: Never    Marital Status: Widowed  Intimate Partner Violence: Not At Risk (11/26/2022)   Humiliation, Afraid, Rape, and Kick questionnaire    Fear of Current or Ex-Partner: No    Emotionally Abused: No    Physically Abused: No    Sexually Abused: No   Family History  Problem Relation Age of Onset   Pneumonia Mother    Brain cancer Father    Tremor Father    Arthritis Sister    COPD Sister    Emphysema Brother    COPD Brother    Tremor Brother    Lung cancer Brother    Heart disease Brother    Liver cancer Brother    Diabetes Other         1 child has diabetes at age 89 and the other 4 are healthy    Objective: Office vital signs reviewed. BP (!) 154/78   Pulse 73   Temp 97.6 F (36.4 C) (Temporal)   Ht 5\' 4"  (1.626 m)   Wt 155 lb (70.3 kg)   SpO2 98%   BMI 26.61 kg/m   Physical Examination:  General:  Awake, alert, well nourished, No acute distress HEENT: sclera white, MMM Cardio: regular rate and rhythm, S1S2 heard, no murmurs appreciated Pulm: clear to auscultation bilaterally, no wheezes, rhonchi or rales; normal work of breathing on room air Extremities: Varicose veins noted along the arches and medial ankles bilaterally  Diabetic Foot Exam - Simple   Simple Foot Form Diabetic Foot exam was performed with the following findings: Yes 03/16/2023  4:59 PM  Visual Inspection No deformities, no ulcerations, no other skin breakdown bilaterally: Yes Sensation Testing Intact to touch and monofilament testing bilaterally: Yes Pulse Check Posterior Tibialis and Dorsalis pulse intact bilaterally: Yes Comments     Assessment/ Plan: 89 y.o. female   Type 2 diabetes mellitus with hyperlipidemia (HCC) - Plan: Bayer DCA Hb A1c Waived, CMP14+EGFR, Lipid Panel, TSH, metFORMIN (GLUCOPHAGE) 1000 MG tablet, pantoprazole (PROTONIX) 40 MG tablet, pravastatin (PRAVACHOL) 40 MG tablet, sitaGLIPtin (JANUVIA) 50 MG tablet  Hypertension associated with diabetes (HCC) - Plan: CMP14+EGFR  Long term current use of oral hypoglycemic drug - Plan: CBC  Frequent falls - Plan: CMP14+EGFR, CBC, Vitamin B12  Overactive bladder - Plan: mirabegron ER (MYRBETRIQ) 50 MG TB24 tablet  Sugar is borderline for age with A1c at 8.0 today.  We discussed need to resume 1000 mg twice daily dosing.  Continue Januvia and watch diet. Foot exam performed  BP not at goal.  No changes.  Dietary changes reinforced. Check renal function.  Check B12 given frequent falls.  Myrbetriq renewed for OAB.  Orders Placed This Encounter  Procedures   Bayer DCA Hb A1c Waived   CMP14+EGFR   Lipid Panel   CBC   TSH   Vitamin B12   Meds ordered this encounter  Medications   mirabegron ER (MYRBETRIQ) 50 MG TB24 tablet    Sig: Take 1 tablet (50 mg total) by mouth daily.    Dispense:  90 tablet  Refill:  3   metFORMIN  (GLUCOPHAGE) 1000 MG tablet    Sig: Take 1 tablet (1,000 mg total) by mouth 2 (two) times daily with a meal.    Dispense:  180 tablet    Refill:  3   nitroGLYCERIN (NITROSTAT) 0.4 MG SL tablet    Sig: Place 1 tablet (0.4 mg total) under the tongue every 5 (five) minutes as needed for chest pain.    Dispense:  25 tablet    Refill:  3   pantoprazole (PROTONIX) 40 MG tablet    Sig: Take 1 tablet (40 mg total) by mouth daily.    Dispense:  90 tablet    Refill:  3    TO BE FILLED 12/4 IN SYNC WITH OTHER RXS   pravastatin (PRAVACHOL) 40 MG tablet    Sig: Take 1 tablet (40 mg total) by mouth daily.    Dispense:  90 tablet    Refill:  3   sitaGLIPtin (JANUVIA) 50 MG tablet    Sig: TAKE ONE TABLET ONCE DAILY FOR SUGAR.    Dispense:  90 tablet    Refill:  3     Masiel Gentzler Hulen Skains, DO [facility] [phone]

## 2023-03-17 LAB — CMP14+EGFR
ALT: 16 IU/L (ref 0–32)
AST: 19 IU/L (ref 0–40)
Albumin: 4.1 g/dL (ref 3.6–4.6)
Alkaline Phosphatase: 61 IU/L (ref 44–121)
BUN/Creatinine Ratio: 13 (ref 12–28)
BUN: 11 mg/dL (ref 10–36)
Bilirubin Total: 0.4 mg/dL (ref 0.0–1.2)
CO2: 21 mmol/L (ref 20–29)
Calcium: 9.7 mg/dL (ref 8.7–10.3)
Chloride: 100 mmol/L (ref 96–106)
Creatinine, Ser: 0.83 mg/dL (ref 0.57–1.00)
Globulin, Total: 2.1 g/dL (ref 1.5–4.5)
Glucose: 230 mg/dL — ABNORMAL HIGH (ref 70–99)
Potassium: 4.1 mmol/L (ref 3.5–5.2)
Sodium: 138 mmol/L (ref 134–144)
Total Protein: 6.2 g/dL (ref 6.0–8.5)
eGFR: 66 mL/min/{1.73_m2} (ref >59)

## 2023-03-17 LAB — CBC
Hematocrit: 36.5 % (ref 34.0–46.6)
Hemoglobin: 11.9 g/dL (ref 11.1–15.9)
MCH: 29.9 pg (ref 26.6–33.0)
MCHC: 32.6 g/dL (ref 31.5–35.7)
MCV: 92 fL (ref 79–97)
Platelets: 301 10*3/uL (ref 150–450)
RBC: 3.98 x10E6/uL (ref 3.77–5.28)
RDW: 12.9 % (ref 11.7–15.4)
WBC: 7.5 10*3/uL (ref 3.4–10.8)

## 2023-03-17 LAB — LIPID PANEL
Chol/HDL Ratio: 2.9 ratio (ref 0.0–4.4)
Cholesterol, Total: 142 mg/dL (ref 100–199)
HDL: 49 mg/dL (ref >39)
LDL Chol Calc (NIH): 65 mg/dL (ref 0–99)
Triglycerides: 166 mg/dL — ABNORMAL HIGH (ref 0–149)
VLDL Cholesterol Cal: 28 mg/dL (ref 5–40)

## 2023-03-17 LAB — VITAMIN B12: Vitamin B-12: 402 pg/mL (ref 232–1245)

## 2023-03-17 LAB — TSH: TSH: 1.46 u[IU]/mL (ref 0.450–4.500)

## 2023-05-27 ENCOUNTER — Telehealth: Payer: Self-pay | Admitting: Family Medicine

## 2023-05-27 ENCOUNTER — Other Ambulatory Visit: Payer: Self-pay

## 2023-05-27 DIAGNOSIS — Z1211 Encounter for screening for malignant neoplasm of colon: Secondary | ICD-10-CM

## 2023-05-27 NOTE — Progress Notes (Signed)
Glad to accommodate. Cannot guarantee that insurance will cover due to age.  Colon cancer screening typically stops at age 89. If wants FOBT, I can order that instead as it's likely several hundred dollars cheaper.

## 2023-05-28 NOTE — Progress Notes (Signed)
Pt's daughter aware of provider feedback and voiced understanding.

## 2023-06-26 ENCOUNTER — Encounter: Payer: Self-pay | Admitting: Nurse Practitioner

## 2023-06-26 ENCOUNTER — Telehealth: Payer: Self-pay | Admitting: Family Medicine

## 2023-06-26 ENCOUNTER — Ambulatory Visit (INDEPENDENT_AMBULATORY_CARE_PROVIDER_SITE_OTHER): Payer: Medicare Other | Admitting: Nurse Practitioner

## 2023-06-26 VITALS — BP 166/86 | HR 78 | Temp 97.1°F | Resp 20 | Ht 64.0 in | Wt 151.0 lb

## 2023-06-26 DIAGNOSIS — N3000 Acute cystitis without hematuria: Secondary | ICD-10-CM

## 2023-06-26 DIAGNOSIS — J00 Acute nasopharyngitis [common cold]: Secondary | ICD-10-CM | POA: Diagnosis not present

## 2023-06-26 DIAGNOSIS — M546 Pain in thoracic spine: Secondary | ICD-10-CM

## 2023-06-26 LAB — URINALYSIS, COMPLETE
Bilirubin, UA: NEGATIVE
Glucose, UA: NEGATIVE
Ketones, UA: NEGATIVE
Nitrite, UA: POSITIVE — AB
Protein,UA: NEGATIVE
RBC, UA: NEGATIVE
Specific Gravity, UA: 1.015 (ref 1.005–1.030)
Urobilinogen, Ur: 0.2 mg/dL (ref 0.2–1.0)
pH, UA: 5.5 (ref 5.0–7.5)

## 2023-06-26 LAB — MICROSCOPIC EXAMINATION
Epithelial Cells (non renal): NONE SEEN /[HPF] (ref 0–10)
RBC, Urine: NONE SEEN /[HPF] (ref 0–2)
Renal Epithel, UA: NONE SEEN /[HPF]

## 2023-06-26 MED ORDER — AMOXICILLIN-POT CLAVULANATE 875-125 MG PO TABS
1.0000 | ORAL_TABLET | Freq: Two times a day (BID) | ORAL | 0 refills | Status: DC
Start: 2023-06-26 — End: 2023-07-17

## 2023-06-26 MED ORDER — FLUCONAZOLE 150 MG PO TABS: 150.0000 mg | ORAL_TABLET | Freq: Once | ORAL | 0 refills | Status: AC

## 2023-06-26 MED ORDER — FLUTICASONE PROPIONATE 50 MCG/ACT NA SUSP
2.0000 | Freq: Every day | NASAL | 6 refills | Status: AC
Start: 2023-06-26 — End: ?

## 2023-06-26 NOTE — Progress Notes (Signed)
Subjective:    Patient ID: Debra Phillips, female    DOB: December 08, 1928, 89 y.o.   MRN: [id number]   Chief Complaint: Nasal Congestion and Back Pain   Patient come sin today with 2 complaints:  Back Pain This is a new problem. The current episode started more than 1 year ago. The problem occurs intermittently. The problem has been gradually worsening since onset. The pain is present in the thoracic spine. The pain is at a severity of 6/10. The pain is moderate. The pain is The same all the time. Associated symptoms include dysuria. Pertinent negatives include no fever or headaches.  URI  This is a new problem. The current episode started in the past 7 days. The problem has been waxing and waning. There has been no fever. Associated symptoms include congestion, coughing, dysuria and rhinorrhea. Pertinent negatives include no headaches or sore throat. She has tried nothing for the symptoms. The treatment provided mild relief.    Patient Active Problem List   Diagnosis Date Noted   Sinus congestion 10/16/2020   Pelvic prolapse 08/28/2016   Tremor 01/29/2016   Abnormality of gait 01/29/2016   Type 2 diabetes mellitus with hyperlipidemia (HCC) 01/14/2016   Age-related macular degeneration 07/13/2015   Keratoconjunctivitis sicca of both eyes 07/13/2015   Meibomian gland dysfunction (MGD), bilateral, both upper and lower lids 07/13/2015   Pseudophakia of both eyes 07/13/2015   Meibomian gland dysfunction 07/13/2015   Osteoarthritis of lumbar spine 05/08/2015   Spondylosis of lumbar region without myelopathy or radiculopathy 09/01/2014   Vitamin D deficiency 04/26/2014   Chronic cystitis 04/26/2014   Atrophic vaginitis 04/26/2014   Solitary pulmonary nodule 03/20/2014   Osteoporotic compression fracture of spine with routine healing 03/08/2014   Compression fracture of vertebral column (HCC) 03/08/2014   PALPITATIONS 02/27/2010   Coronary atherosclerosis 12/12/2009   Aortic aneurysm  (HCC) 06/27/2009   DYSPNEA 06/27/2009   Hypertension associated with diabetes (HCC) 12/12/2008   GERD 12/12/2008   Osteoarthritis 12/12/2008       Review of Systems  Constitutional:  Negative for chills and fever.  HENT:  Positive for congestion and rhinorrhea. Negative for sore throat.   Respiratory:  Positive for cough.   Genitourinary:  Positive for dysuria.  Musculoskeletal:  Positive for back pain.  Neurological:  Negative for headaches.       Objective:   Physical Exam Vitals reviewed.  Constitutional:      Appearance: Normal appearance.  HENT:     Right Ear: Tympanic membrane normal. There is no impacted cerumen.     Left Ear: Tympanic membrane normal. There is no impacted cerumen.     Nose: Congestion and rhinorrhea present.     Mouth/Throat:     Pharynx: No oropharyngeal exudate or posterior oropharyngeal erythema.  Cardiovascular:     Rate and Rhythm: Normal rate and regular rhythm.     Heart sounds: Normal heart sounds.  Pulmonary:     Effort: Pulmonary effort is normal.     Breath sounds: Normal breath sounds.  Skin:    General: Skin is warm.  Neurological:     General: No focal deficit present.     Mental Status: She is alert and oriented to person, place, and time.  Psychiatric:        Mood and Affect: Mood normal.        Behavior: Behavior normal.    BP (!) 166/86   Pulse 78   Temp (!) 97.1 F (36.2 C) (Temporal)  Resp 20   Ht 5\' 4"  (1.626 m)   Wt 151 lb (68.5 kg)   SpO2 96%   BMI 25.92 kg/m    Urine positive  nitrites     Assessment & Plan:   Debra Phillips in today with chief complaint of Nasal Congestion and Back Pain   1. Acute left-sided thoracic back pain - Urinalysis, Complete  2. Acute cystitis without hematuria Take medication as prescribe Cotton underwear Take shower not bath Cranberry juice, yogurt Force fluids AZO over the counter X2 days Culture pending RTO prn  - amoxicillin-clavulanate (AUGMENTIN)  875-125 MG tablet; Take 1 tablet by mouth 2 (two) times daily.  Dispense: 14 tablet; Refill: 0  3. Acute nasopharyngitis 1. Take meds as prescribed 2. Use a cool mist humidifier especially during the winter months and when heat has been humid. 3. Use saline nose sprays frequently 4. Saline irrigations of the nose can be very helpful if done frequently.  * 4X daily for 1 week*  * Use of a nettie pot can be helpful with this. Follow directions with this* 5. Drink plenty of fluids 6. Keep thermostat turn down low 7.For any cough or congestion- delsym OTC 8. For fever or aces or pains- take tylenol or ibuprofen appropriate for age and weight.  * for fevers greater than 101 orally you may alternate ibuprofen and tylenol every  3 hours.    - fluticasone (FLONASE) 50 MCG/ACT nasal spray; Place 2 sprays into both nostrils daily.  Dispense: 16 g; Refill: 6    The above assessment and management plan was discussed with the patient. The patient verbalized understanding of and has agreed to the management plan. Patient is aware to call the clinic if symptoms persist or worsen. Patient is aware when to return to the clinic for a follow-up visit. Patient educated on when it is appropriate to go to the emergency department.   Mary-Margaret Daphine Deutscher, FNP

## 2023-06-26 NOTE — Telephone Encounter (Signed)
Meds ordered this encounter  Medications   fluconazole (DIFLUCAN) 150 MG tablet    Sig: Take 1 tablet (150 mg total) by mouth once for 1 dose.    Dispense:  1 tablet    Refill:  0

## 2023-06-26 NOTE — Patient Instructions (Signed)
Take medication as prescribe Cotton underwear Take shower not bath Cranberry juice, yogurt Force fluids AZO over the counter X2 days Culture pending RTO prn  

## 2023-07-17 ENCOUNTER — Encounter: Payer: Self-pay | Admitting: Family Medicine

## 2023-07-17 ENCOUNTER — Ambulatory Visit (INDEPENDENT_AMBULATORY_CARE_PROVIDER_SITE_OTHER): Payer: Medicare Other | Admitting: Family Medicine

## 2023-07-17 VITALS — BP 145/77 | HR 73 | Temp 98.5°F | Ht 64.0 in | Wt 151.0 lb

## 2023-07-17 DIAGNOSIS — G3184 Mild cognitive impairment, so stated: Secondary | ICD-10-CM

## 2023-07-17 DIAGNOSIS — J3489 Other specified disorders of nose and nasal sinuses: Secondary | ICD-10-CM | POA: Diagnosis not present

## 2023-07-17 DIAGNOSIS — E119 Type 2 diabetes mellitus without complications: Secondary | ICD-10-CM | POA: Diagnosis not present

## 2023-07-17 DIAGNOSIS — Z7984 Long term (current) use of oral hypoglycemic drugs: Secondary | ICD-10-CM | POA: Diagnosis not present

## 2023-07-17 DIAGNOSIS — E785 Hyperlipidemia, unspecified: Secondary | ICD-10-CM | POA: Diagnosis not present

## 2023-07-17 DIAGNOSIS — E1169 Type 2 diabetes mellitus with other specified complication: Secondary | ICD-10-CM | POA: Diagnosis not present

## 2023-07-17 DIAGNOSIS — I152 Hypertension secondary to endocrine disorders: Secondary | ICD-10-CM

## 2023-07-17 DIAGNOSIS — E1159 Type 2 diabetes mellitus with other circulatory complications: Secondary | ICD-10-CM | POA: Diagnosis not present

## 2023-07-17 LAB — BAYER DCA HB A1C WAIVED: HB A1C (BAYER DCA - WAIVED): 7.6 % — ABNORMAL HIGH (ref 4.8–5.6)

## 2023-07-17 MED ORDER — MUPIROCIN 2 % EX OINT
1.0000 | TOPICAL_OINTMENT | Freq: Two times a day (BID) | CUTANEOUS | 0 refills | Status: AC
Start: 2023-07-17 — End: 2023-07-22

## 2023-07-17 NOTE — Progress Notes (Signed)
Subjective: CC:DM PCP: Raliegh Ip, DO Debra Phillips is a 89 y.o. female presenting to clinic today for:  1. Type 2 Diabetes with hypertension, hyperlipidemia:  Has been waiting for Januvia to come in at the pharmacy.  Otherwise compliant with all medications.  Diabetes Health Maintenance Due  Topic Date Due   OPHTHALMOLOGY EXAM  07/23/2022   HEMOGLOBIN A1C  09/16/2023   FOOT EXAM  03/15/2024    Last A1c:  Lab Results  Component Value Date   HGBA1C 8.0 (H) 03/16/2023    ROS: No chest pain, shortness of breath reported but recently recovering from URI and UTI.  Denies any dysuria, hematuria.  Continues to have left upper back pain that preceded UTI.  No fevers, nausea, vomiting.  She is a bit reluctant to take Tylenol regularly.  Not utilizing anything topically  ROS: Per HPI  Allergies  Allergen Reactions   Codeine Nausea And Vomiting and Other (See Comments)   Doxycycline Hives   Other Nausea Only    Almost all antibiotics   Sulfonamide Derivatives Hives   Vancomycin Itching   Farxiga [Dapagliflozin]     Recurrent UTIs   Past Medical History:  Diagnosis Date   Abnormality of gait 01/29/2016   Aortic root dilatation (HCC)    Mild   Cataract    Diabetes mellitus without complication (HCC)    type 2   Dyslipidemia    Dyspnea    with exertion   Dysrhythmia    hx of occ palpitations   Esophageal stricture    s/p dilation   GERD (gastroesophageal reflux disease)    Headache    History of hiatal hernia    Hyperlipidemia    Hypertension    Macular pucker, left eye    Osteoarthritis    Osteoporosis    Tinnitus    left ear last week or so    Current Outpatient Medications:    Artificial Saliva (BIOTENE DRY MOUTH MOISTURIZING) SOLN, Use as directed 5 mLs in the mouth or throat at bedtime., Disp: 44.3 mL, Rfl: 1   aspirin EC 81 MG tablet, Take 81 mg by mouth daily., Disp: , Rfl:    Cholecalciferol (VITAMIN D3) 10 MCG (400 UNIT) CAPS, Take by  mouth., Disp: , Rfl:    diltiazem (CARDIZEM CD) 180 MG 24 hr capsule, TAKE ONE CAPSULE ONCE DAILY FOR BLOOD PRESSURE, Disp: 90 capsule, Rfl: 3   estradiol (ESTRACE) 0.1 MG/GM vaginal cream, APPLY A PEA SIZE AMOUNT PER VAGINA 2-3 NIGHTS A WEEK -FOR VAGINAL USE-, Disp: 42.5 g, Rfl: 6   fluticasone (FLONASE) 50 MCG/ACT nasal spray, Place 2 sprays into both nostrils daily., Disp: 16 g, Rfl: 6   Lancets (ONETOUCH DELICA PLUS LANCET33G) MISC, CHECK BLOOD SUGAR ONCE A DAY Dx E11.69, Disp: 100 each, Rfl: 4   lisinopril (ZESTRIL) 40 MG tablet, TAKE ONE TABLET ONCE DAILY FOR BLOOD PRESSURE, Disp: 90 tablet, Rfl: 3   metFORMIN (GLUCOPHAGE) 1000 MG tablet, Take 1 tablet (1,000 mg total) by mouth 2 (two) times daily with a meal., Disp: 180 tablet, Rfl: 3   mirabegron ER (MYRBETRIQ) 50 MG TB24 tablet, Take 1 tablet (50 mg total) by mouth daily., Disp: 90 tablet, Rfl: 3   multivitamin-lutein (OCUVITE-LUTEIN) CAPS capsule, Take 1 capsule by mouth daily., Disp: , Rfl:    nitroGLYCERIN (NITROSTAT) 0.4 MG SL tablet, Place 1 tablet (0.4 mg total) under the tongue every 5 (five) minutes as needed for chest pain., Disp: 25 tablet, Rfl: 3  Omega-3 Fatty Acids (FISH OIL) 1200 MG CAPS, Take by mouth., Disp: , Rfl:    ONETOUCH ULTRA test strip, CHECK BLOOD SUGAR BID Dx E11.69, Disp: 100 strip, Rfl: 4   pantoprazole (PROTONIX) 40 MG tablet, Take 1 tablet (40 mg total) by mouth daily., Disp: 90 tablet, Rfl: 3   pravastatin (PRAVACHOL) 40 MG tablet, Take 1 tablet (40 mg total) by mouth daily., Disp: 90 tablet, Rfl: 3   sitaGLIPtin (JANUVIA) 50 MG tablet, TAKE ONE TABLET ONCE DAILY FOR SUGAR., Disp: 90 tablet, Rfl: 3   sodium chloride (OCEAN) 0.65 % SOLN nasal spray, Place 1 spray into both nostrils as needed for congestion., Disp: 30 mL, Rfl: 0 Social History   Socioeconomic History   Marital status: Widowed    Spouse name: Not on file   Number of children: 5   Years of education: 5   Highest education level: Not on file   Occupational History   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    Comment: Retired  Tobacco Use   Smoking status: Never   Smokeless tobacco: Never  Vaping Use   Vaping status: Never Used  Substance and Sexual Activity   Alcohol use: No   Drug use: No   Sexual activity: Not Currently  Other Topics Concern   Not on file  Social History Narrative   Pt lives at home alone   Husband w/ Alzheimer's passed away   Right-handed   Drinks 1-2 cups of coffee daily   Social Determinants of Health   Financial Resource Strain: Low Risk  (11/26/2022)   Overall Financial Resource Strain (CARDIA)    Difficulty of Paying Living Expenses: Not hard at all  Food Insecurity: No Food Insecurity (11/26/2022)   Hunger Vital Sign    Worried About Running Out of Food in the Last Year: Never true    Ran Out of Food in the Last Year: Never true  Transportation Needs: No Transportation Needs (11/26/2022)   PRAPARE - Administrator, Civil Service (Medical): No    Lack of Transportation (Non-Medical): No  Physical Activity: Inactive (11/26/2022)   Exercise Vital Sign    Days of Exercise per Week: 0 days    Minutes of Exercise per Session: 0 min  Stress: No Stress Concern Present (11/26/2022)   [employer] of Occupational Health - Occupational Stress Questionnaire    Feeling of Stress : Not at all  Social Connections: Moderately Isolated (11/26/2022)   Social Connection and Isolation Panel [NHANES]    Frequency of Communication with Friends and Family: More than three times a week    Frequency of Social Gatherings with Friends and Family: More than three times a week    Attends Religious Services: More than 4 times per year    Active Member of [employer] or Organizations: No    Attends Banker Meetings: Never    Marital Status: Widowed  Intimate Partner Violence: Not At Risk (11/26/2022)   Humiliation, Afraid, Rape, and Kick questionnaire    Fear of Current or Ex-Partner:  No    Emotionally Abused: No    Physically Abused: No    Sexually Abused: No   Family History  Problem Relation Age of Onset   Pneumonia Mother    Brain cancer Father    Tremor Father    Arthritis Sister    COPD Sister    Emphysema Brother    COPD Brother    Tremor Brother    Lung  cancer Brother    Heart disease Brother    Liver cancer Brother    Diabetes Other         1 child has diabetes at age 71 and the other 4 are healthy    Objective: Office vital signs reviewed. BP (!) 145/77   Pulse 73   Temp 98.5 F (36.9 C)   Ht 5\' 4"  (1.626 m)   Wt 151 lb (68.5 kg)   SpO2 97%   BMI 25.92 kg/m   Physical Examination:  General: Awake, alert, well nourished, No acute distress HEENT: Left nostril with evidence of nasal vestibulitis.  Tender to touch. Cardio: regular rate and rhythm, S1S2 heard, no murmurs appreciated Pulm: clear to auscultation bilaterally, no wheezes, rhonchi or rales; normal work of breathing on room air Extremities: warm, well perfused, +1 pitting edema left greater than right of the lower extremities, no cyanosis or clubbing; +2 pulses bilaterally MSK: antalgic gait and hunched station.  Requires assistance for ambulation   Assessment/ Plan: 89 y.o. female   Diabetes mellitus treated with oral medication (HCC) - Plan: Bayer DCA Hb A1c Waived  Hypertension associated with diabetes (HCC)  Hyperlipidemia associated with type 2 diabetes mellitus (HCC)  Mild cognitive impairment - Plan: Vitamin B12  Nasal vestibulitis - Plan: mupirocin ointment (BACTROBAN) 2 %  Sugar acceptable range for age at 22.6 today.  No changes.  Blood pressure controlled for age.  No changes  Continue current regimen for cholesterol control.  Though may consider stopping this totally given advanced age  Will check B12 per her daughter's request  Bactroban ointment prescribed for nasal vestibulitis of the left nare  Gianmarco Roye Hulen Skains, DO [facility] [phone]

## 2023-07-17 NOTE — Patient Instructions (Signed)
Salonpas lidocaine patches to affected area of pain on back

## 2023-07-18 LAB — VITAMIN B12: Vitamin B-12: 431 pg/mL (ref 232–1245)

## 2023-09-07 ENCOUNTER — Telehealth: Payer: Self-pay | Admitting: Family Medicine

## 2023-09-07 NOTE — Telephone Encounter (Signed)
 Copied from CRM [id number]. Topic: Clinical - Medical Advice >> Sep 07, 2023 11:51 AM Delon DASEN wrote: Reason for CRM: daughter Apolinar concerned about patient, she had stomach bug on Friday, has not vomited since then, she does not have an appetite and is diabetic. She is up and eating but there is concern of dehydration.  Daughter is concerned she may need fluids. Please call Apolinar [phone]

## 2023-09-07 NOTE — Telephone Encounter (Signed)
 Apt scheduled.

## 2023-09-08 ENCOUNTER — Encounter: Payer: Self-pay | Admitting: Nurse Practitioner

## 2023-09-08 ENCOUNTER — Ambulatory Visit (INDEPENDENT_AMBULATORY_CARE_PROVIDER_SITE_OTHER): Payer: Medicare Other | Admitting: Nurse Practitioner

## 2023-09-08 VITALS — BP 152/76 | HR 76 | Temp 98.1°F | Ht 64.0 in | Wt 152.0 lb

## 2023-09-08 DIAGNOSIS — A084 Viral intestinal infection, unspecified: Secondary | ICD-10-CM | POA: Diagnosis not present

## 2023-09-08 LAB — MICROSCOPIC EXAMINATION
RBC, Urine: NONE SEEN /[HPF] (ref 0–2)
Renal Epithel, UA: NONE SEEN /[HPF]
Yeast, UA: NONE SEEN

## 2023-09-08 LAB — URINALYSIS, COMPLETE
Bilirubin, UA: NEGATIVE
Glucose, UA: NEGATIVE
Ketones, UA: NEGATIVE
Nitrite, UA: POSITIVE — AB
RBC, UA: NEGATIVE
Specific Gravity, UA: 1.025 (ref 1.005–1.030)
Urobilinogen, Ur: 0.2 mg/dL (ref 0.2–1.0)
pH, UA: 5.5 (ref 5.0–7.5)

## 2023-09-08 NOTE — Addendum Note (Signed)
 Addended by: Cleda Daub on: 09/08/2023 11:52 AM   Modules accepted: Orders

## 2023-09-08 NOTE — Progress Notes (Signed)
 Subjective:    Patient ID: Debra Phillips, female    DOB: 1928/12/08, 89 y.o.   MRN: [id number]   Chief Complaint: Vomiting (Started Friday. Laying in bed and sleeping off and on all weekend. Better since yesterday) and Diarrhea   Patient brought in  by her daughter. She had vomiting and diarrhea last Friday that lasted about 24 hours. No vomiting since Friday. She had diarrhea over the weekend and they gave her imodium. Some better. However she is still weak and tired. Wanted to make sure she was not dehydrated.    Patient Active Problem List   Diagnosis Date Noted   Sinus congestion 10/16/2020   Pelvic prolapse 08/28/2016   Tremor 01/29/2016   Abnormality of gait 01/29/2016   Age-related macular degeneration 07/13/2015   Keratoconjunctivitis sicca of both eyes 07/13/2015   Meibomian gland dysfunction (MGD), bilateral, both upper and lower lids 07/13/2015   Pseudophakia of both eyes 07/13/2015   Meibomian gland dysfunction 07/13/2015   Osteoarthritis of lumbar spine 05/08/2015   Spondylosis of lumbar region without myelopathy or radiculopathy 09/01/2014   Diabetes mellitus treated with oral medication (HCC) 04/26/2014   Vitamin D  deficiency 04/26/2014   Chronic cystitis 04/26/2014   Atrophic vaginitis 04/26/2014   Solitary pulmonary nodule 03/20/2014   Osteoporotic compression fracture of spine with routine healing 03/08/2014   Compression fracture of vertebral column (HCC) 03/08/2014   PALPITATIONS 02/27/2010   Coronary atherosclerosis 12/12/2009   Aortic aneurysm (HCC) 06/27/2009   DYSPNEA 06/27/2009   Hyperlipidemia associated with type 2 diabetes mellitus (HCC) 12/12/2008   Hypertension associated with diabetes (HCC) 12/12/2008   GERD 12/12/2008   Osteoarthritis 12/12/2008        Review of Systems  Constitutional:  Negative for diaphoresis.  Eyes:  Negative for pain.  Respiratory:  Negative for shortness of breath.   Cardiovascular:  Negative for chest  pain, palpitations and leg swelling.  Gastrointestinal:  Negative for abdominal pain, diarrhea and nausea.  Endocrine: Negative for polydipsia.  Skin:  Negative for rash.  Neurological:  Negative for dizziness, weakness and headaches.  Hematological:  Does not bruise/bleed easily.  All other systems reviewed and are negative.      Objective:   Physical Exam Constitutional:      Appearance: Normal appearance.  Cardiovascular:     Rate and Rhythm: Normal rate and regular rhythm.     Heart sounds: Normal heart sounds.  Pulmonary:     Effort: Pulmonary effort is normal.     Breath sounds: Normal breath sounds.  Abdominal:     General: Abdomen is flat.     Palpations: Abdomen is soft.     Tenderness: There is no abdominal tenderness.  Skin:    General: Skin is warm.  Neurological:     General: No focal deficit present.     Mental Status: She is alert and oriented to person, place, and time.  Psychiatric:        Mood and Affect: Mood normal.        Behavior: Behavior normal.     BP (!) 152/76   Pulse 76   Temp 98.1 F (36.7 C) (Temporal)   Ht 5' 4 (1.626 m)   Wt 152 lb (68.9 kg)   SpO2 99%   BMI 26.09 kg/m        Assessment & Plan:   Debra Phillips in today with chief complaint of Vomiting (Started Friday. Laying in bed and sleeping off and on all weekend. Better  since yesterday) and Diarrhea   1. Viral gastroenteritis (Primary) Resolved Labs pending Force fluids- gatorade or powerade  Orders Placed This Encounter  Procedures   BMP8+EGFR   CBC with Differential/Platelet        The above assessment and management plan was discussed with the patient. The patient verbalized understanding of and has agreed to the management plan. Patient is aware to call the clinic if symptoms persist or worsen. Patient is aware when to return to the clinic for a follow-up visit. Patient educated on when it is appropriate to go to the emergency department.    Mary-Margaret Gladis, FNP

## 2023-09-09 LAB — BMP8+EGFR
BUN/Creatinine Ratio: 15 (ref 12–28)
BUN: 12 mg/dL (ref 10–36)
CO2: 22 mmol/L (ref 20–29)
Calcium: 8.7 mg/dL (ref 8.7–10.3)
Chloride: 101 mmol/L (ref 96–106)
Creatinine, Ser: 0.78 mg/dL (ref 0.57–1.00)
Glucose: 233 mg/dL — ABNORMAL HIGH (ref 70–99)
Potassium: 3.6 mmol/L (ref 3.5–5.2)
Sodium: 139 mmol/L (ref 134–144)
eGFR: 71 mL/min/{1.73_m2} (ref >59)

## 2023-09-09 LAB — CBC WITH DIFFERENTIAL/PLATELET
Basophils Absolute: 0 10*3/uL (ref 0.0–0.2)
Basos: 1 %
EOS (ABSOLUTE): 0.1 10*3/uL (ref 0.0–0.4)
Eos: 1 %
Hematocrit: 34.9 % (ref 34.0–46.6)
Hemoglobin: 11.3 g/dL (ref 11.1–15.9)
Immature Grans (Abs): 0 10*3/uL (ref 0.0–0.1)
Immature Granulocytes: 0 %
Lymphocytes Absolute: 2.2 10*3/uL (ref 0.7–3.1)
Lymphs: 36 %
MCH: 30.5 pg (ref 26.6–33.0)
MCHC: 32.4 g/dL (ref 31.5–35.7)
MCV: 94 fL (ref 79–97)
Monocytes Absolute: 0.6 10*3/uL (ref 0.1–0.9)
Monocytes: 9 %
Neutrophils Absolute: 3.2 10*3/uL (ref 1.4–7.0)
Neutrophils: 53 %
Platelets: 269 10*3/uL (ref 150–450)
RBC: 3.7 x10E6/uL — ABNORMAL LOW (ref 3.77–5.28)
RDW: 12.6 % (ref 11.7–15.4)
WBC: 6.1 10*3/uL (ref 3.4–10.8)

## 2023-09-10 MED ORDER — CIPROFLOXACIN HCL 500 MG PO TABS: 500.0000 mg | ORAL_TABLET | Freq: Two times a day (BID) | ORAL | 0 refills | Status: DC

## 2023-09-10 NOTE — Addendum Note (Signed)
Addended by: Bennie Pierini on: 09/10/2023 01:09 PM   Modules accepted: Orders

## 2023-11-04 DIAGNOSIS — H353111 Nonexudative age-related macular degeneration, right eye, early dry stage: Secondary | ICD-10-CM | POA: Diagnosis not present

## 2023-11-04 DIAGNOSIS — H353190 Nonexudative age-related macular degeneration, unspecified eye, stage unspecified: Secondary | ICD-10-CM | POA: Diagnosis not present

## 2023-11-16 ENCOUNTER — Encounter: Payer: Self-pay | Admitting: Family Medicine

## 2023-11-16 ENCOUNTER — Ambulatory Visit: Payer: Medicare Other | Admitting: Family Medicine

## 2023-11-16 VITALS — BP 141/83 | HR 87 | Temp 98.4°F | Ht 64.0 in | Wt 153.2 lb

## 2023-11-16 DIAGNOSIS — I152 Hypertension secondary to endocrine disorders: Secondary | ICD-10-CM | POA: Diagnosis not present

## 2023-11-16 DIAGNOSIS — Z7984 Long term (current) use of oral hypoglycemic drugs: Secondary | ICD-10-CM

## 2023-11-16 DIAGNOSIS — K219 Gastro-esophageal reflux disease without esophagitis: Secondary | ICD-10-CM

## 2023-11-16 DIAGNOSIS — E785 Hyperlipidemia, unspecified: Secondary | ICD-10-CM | POA: Diagnosis not present

## 2023-11-16 DIAGNOSIS — E1159 Type 2 diabetes mellitus with other circulatory complications: Secondary | ICD-10-CM | POA: Diagnosis not present

## 2023-11-16 DIAGNOSIS — N3281 Overactive bladder: Secondary | ICD-10-CM

## 2023-11-16 DIAGNOSIS — E119 Type 2 diabetes mellitus without complications: Secondary | ICD-10-CM

## 2023-11-16 DIAGNOSIS — E1169 Type 2 diabetes mellitus with other specified complication: Secondary | ICD-10-CM | POA: Diagnosis not present

## 2023-11-16 LAB — BAYER DCA HB A1C WAIVED: HB A1C (BAYER DCA - WAIVED): 7 % — ABNORMAL HIGH (ref 4.8–5.6)

## 2023-11-16 MED ORDER — SITAGLIPTIN PHOSPHATE 50 MG PO TABS
ORAL_TABLET | ORAL | 4 refills | Status: AC
Start: 2023-11-16 — End: ?

## 2023-11-16 MED ORDER — PRAVASTATIN SODIUM 40 MG PO TABS
40.0000 mg | ORAL_TABLET | Freq: Every day | ORAL | 3 refills | Status: AC
Start: 2023-11-16 — End: ?

## 2023-11-16 MED ORDER — METFORMIN HCL 1000 MG PO TABS
1000.0000 mg | ORAL_TABLET | Freq: Every day | ORAL | 4 refills | Status: AC
Start: 2023-11-16 — End: ?

## 2023-11-16 MED ORDER — MIRABEGRON ER 50 MG PO TB24
50.0000 mg | ORAL_TABLET | Freq: Every day | ORAL | 4 refills | Status: AC
Start: 2023-11-16 — End: ?

## 2023-11-16 MED ORDER — DILTIAZEM HCL ER COATED BEADS 180 MG PO CP24: ORAL_CAPSULE | ORAL | 4 refills | Status: DC

## 2023-11-16 MED ORDER — PANTOPRAZOLE SODIUM 40 MG PO TBEC
40.0000 mg | DELAYED_RELEASE_TABLET | Freq: Every day | ORAL | 4 refills | Status: AC
Start: 2023-11-16 — End: ?

## 2023-11-16 NOTE — Addendum Note (Signed)
 Addended by: Waynette Buttery on: 11/16/2023 04:15 PM   Modules accepted: Orders

## 2023-11-16 NOTE — Progress Notes (Signed)
 Subjective: CC:DM PCP: Raliegh Ip, DO Debra Phillips is a 89 y.o. female presenting to clinic today for:  1. Type 2 Diabetes with hypertension, hyperlipidemia:  She is accompanied today visit by Debra Phillips.  They have been compliant with medications for the most part but often she will forget to take Debra evening dose of metformin.  No hypoglycemic episodes.  She typically eats and drinks what she wants.  Just had Debra 89rd birthday  Diabetes Health Maintenance Due  Topic Date Due   OPHTHALMOLOGY EXAM  07/23/2022   HEMOGLOBIN A1C  01/14/2024   FOOT EXAM  03/15/2024    Last A1c:  Lab Results  Component Value Date   HGBA1C 7.6 (H) 07/17/2023    ROS: Has chronic lower extremity edema.  No chest pain, shortness of breath.  Does have some dry mouth.  Does not drink a lot of water.  Has some visual disturbance secondary to macular degeneration.  Not utilizing shots for this at this time   ROS: Per HPI  Allergies  Allergen Reactions   Codeine Nausea And Vomiting and Other (See Comments)   Doxycycline Hives   Other Nausea Only    Almost all antibiotics   Sulfonamide Derivatives Hives   Vancomycin Itching   Farxiga [Dapagliflozin]     Recurrent UTIs   Past Medical History:  Diagnosis Date   Abnormality of gait 01/29/2016   Aortic root dilatation (HCC)    Mild   Cataract    Diabetes mellitus without complication (HCC)    type 2   Dyslipidemia    Dyspnea    with exertion   Dysrhythmia    hx of occ palpitations   Esophageal stricture    s/p dilation   GERD (gastroesophageal reflux disease)    Headache    History of hiatal hernia    Hyperlipidemia    Hypertension    Macular pucker, left eye    Osteoarthritis    Osteoporosis    Tinnitus    left ear last week or so    Current Outpatient Medications:    Artificial Saliva (BIOTENE DRY MOUTH MOISTURIZING) SOLN, Use as directed 5 mLs in the mouth or throat at bedtime., Disp: 44.3 mL, Rfl: 1   aspirin  EC 81 MG tablet, Take 81 mg by mouth daily., Disp: , Rfl:    Cholecalciferol (VITAMIN D3) 10 MCG (400 UNIT) CAPS, Take by mouth., Disp: , Rfl:    diltiazem (CARDIZEM CD) 180 MG 24 hr capsule, TAKE ONE CAPSULE ONCE DAILY FOR BLOOD PRESSURE, Disp: 90 capsule, Rfl: 3   estradiol (ESTRACE) 0.1 MG/GM vaginal cream, APPLY A PEA SIZE AMOUNT PER VAGINA 2-3 NIGHTS A WEEK -FOR VAGINAL USE-, Disp: 42.5 g, Rfl: 6   fluticasone (FLONASE) 50 MCG/ACT nasal spray, Place 2 sprays into both nostrils daily., Disp: 16 g, Rfl: 6   Lancets (ONETOUCH DELICA PLUS LANCET33G) MISC, CHECK BLOOD SUGAR ONCE A DAY Dx E11.69, Disp: 100 each, Rfl: 4   lisinopril (ZESTRIL) 40 MG tablet, TAKE ONE TABLET ONCE DAILY FOR BLOOD PRESSURE, Disp: 90 tablet, Rfl: 3   metFORMIN (GLUCOPHAGE) 1000 MG tablet, Take 1 tablet (1,000 mg total) by mouth 2 (two) times daily with a meal., Disp: 180 tablet, Rfl: 3   mirabegron ER (MYRBETRIQ) 50 MG TB24 tablet, Take 1 tablet (50 mg total) by mouth daily., Disp: 90 tablet, Rfl: 3   multivitamin-lutein (OCUVITE-LUTEIN) CAPS capsule, Take 1 capsule by mouth daily., Disp: , Rfl:    nitroGLYCERIN (NITROSTAT)  0.4 MG SL tablet, Place 1 tablet (0.4 mg total) under the tongue every 5 (five) minutes as needed for chest pain., Disp: 25 tablet, Rfl: 3   Omega-3 Fatty Acids (FISH OIL) 1200 MG CAPS, Take by mouth., Disp: , Rfl:    ONETOUCH ULTRA test strip, CHECK BLOOD SUGAR BID Dx E11.69, Disp: 100 strip, Rfl: 4   pantoprazole (PROTONIX) 40 MG tablet, Take 1 tablet (40 mg total) by mouth daily., Disp: 90 tablet, Rfl: 3   pravastatin (PRAVACHOL) 40 MG tablet, Take 1 tablet (40 mg total) by mouth daily., Disp: 90 tablet, Rfl: 3   sitaGLIPtin (JANUVIA) 50 MG tablet, TAKE ONE TABLET ONCE DAILY FOR SUGAR., Disp: 90 tablet, Rfl: 3   sodium chloride (OCEAN) 0.65 % SOLN nasal spray, Place 1 spray into both nostrils as needed for congestion., Disp: 30 mL, Rfl: 0 Social History   Socioeconomic History   Marital status:  Widowed    Spouse name: Not on file   Number of children: 5   Years of education: 5   Highest education level: Not on file  Occupational History   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    Comment: Retired  Tobacco Use   Smoking status: Never   Smokeless tobacco: Never  Vaping Use   Vaping status: Never Used  Substance and Sexual Activity   Alcohol use: No   Drug use: No   Sexual activity: Not Currently  Other Topics Concern   Not on file  Social History Narrative   Pt lives at home alone   Husband w/ Alzheimer's passed away   Right-handed   Drinks 1-2 cups of coffee daily   Social Drivers of Corporate investment banker Strain: Low Risk  (11/26/2022)   Overall Financial Resource Strain (CARDIA)    Difficulty of Paying Living Expenses: Not hard at all  Food Insecurity: No Food Insecurity (11/26/2022)   Hunger Vital Sign    Worried About Running Out of Food in the Last Year: Never true    Ran Out of Food in the Last Year: Never true  Transportation Needs: No Transportation Needs (11/26/2022)   PRAPARE - Administrator, Civil Service (Medical): No    Lack of Transportation (Non-Medical): No  Physical Activity: Inactive (11/26/2022)   Exercise Vital Sign    Days of Exercise per Week: 0 days    Minutes of Exercise per Session: 0 min  Stress: No Stress Concern Present (11/26/2022)   [employer] of Occupational Health - Occupational Stress Questionnaire    Feeling of Stress : Not at all  Social Connections: Moderately Isolated (11/26/2022)   Social Connection and Isolation Panel [NHANES]    Frequency of Communication with Friends and Family: More than three times a week    Frequency of Social Gatherings with Friends and Family: More than three times a week    Attends Religious Services: More than 4 times per year    Active Member of [employer] or Organizations: No    Attends Banker Meetings: Never    Marital Status: Widowed  Intimate Partner  Violence: Not At Risk (11/26/2022)   Humiliation, Afraid, Rape, and Kick questionnaire    Fear of Current or Ex-Partner: No    Emotionally Abused: No    Physically Abused: No    Sexually Abused: No   Family History  Problem Relation Age of Onset   Pneumonia Mother    Brain cancer Father    Tremor Father  Arthritis Sister    COPD Sister    Emphysema Brother    COPD Brother    Tremor Brother    Lung cancer Brother    Heart disease Brother    Liver cancer Brother    Diabetes Other         1 child has diabetes at age 68 and the other 4 are healthy    Objective: Office vital signs reviewed. BP (!) 141/83   Pulse 87   Temp 98.4 F (36.9 C)   Ht 5\' 4"  (1.626 m)   Wt 153 lb 3.2 oz (69.5 kg)   SpO2 95%   BMI 26.30 kg/m   Physical Examination:  General: Awake, alert, well nourished, No acute distress HEENT: Sclera white.  Mucous membranes minimally tacky Cardio: regular rate and rhythm, S1S2 heard, no murmurs appreciated Pulm: clear to auscultation bilaterally, no wheezes, rhonchi or rales; normal work of breathing on room air Extremities: Warm, trace to +1 pitting edema bilaterally to mid shins MSK: Ambulates with assistance  Assessment/ Plan: 89 y.o. female   Diabetes mellitus treated with oral medication (HCC) - Plan: Bayer DCA Hb A1c Waived, sitaGLIPtin (JANUVIA) 50 MG tablet, metFORMIN (GLUCOPHAGE) 1000 MG tablet  Hypertension associated with diabetes (HCC) - Plan: diltiazem (CARDIZEM CD) 180 MG 24 hr capsule  Hyperlipidemia associated with type 2 diabetes mellitus (HCC) - Plan: pravastatin (PRAVACHOL) 40 MG tablet  Overactive bladder - Plan: mirabegron ER (MYRBETRIQ) 50 MG TB24 tablet  Gastroesophageal reflux disease without esophagitis - Plan: pantoprazole (PROTONIX) 40 MG tablet  Sugar at goal at 7.0.  No changes.  I think okay to use metformin once daily and continue Januvia 50 mg daily as directed.  Blood pressure controlled.  No changes needed.  Refill  sent  Continue statin for now but at some point I think it is reasonable to discontinue the medication, particular given MCI  Did not discuss overactive bladder GERD but refills were needed so refill sent  Raliegh Ip, DO [facility] [phone]

## 2023-11-27 ENCOUNTER — Ambulatory Visit: Payer: Medicare Other

## 2023-11-27 VITALS — Ht 64.0 in | Wt 153.0 lb

## 2023-11-27 DIAGNOSIS — Z0000 Encounter for general adult medical examination without abnormal findings: Secondary | ICD-10-CM

## 2023-11-27 NOTE — Progress Notes (Signed)
 Subjective:   Debra Phillips is a 89 y.o. who presents for a Medicare Wellness preventive visit.  Visit Complete: Virtual I connected with  Clemetine Marker on 11/27/23 by a audio enabled telemedicine application and verified that I am speaking with the correct person using two identifiers.  Patient Location: Home  Provider Location: Home Office  I discussed the limitations of evaluation and management by telemedicine. The patient expressed understanding and agreed to proceed.  Vital Signs: Because this visit was a virtual/telehealth visit, some criteria may be missing or patient reported. Any vitals not documented were not able to be obtained and vitals that have been documented are patient reported.  VideoDeclined- This patient declined Librarian, academic. Therefore the visit was completed with audio only.  Persons Participating in Visit: Patient assisted by daughter Patsi Sears .  AWV Questionnaire: No: Patient Medicare AWV questionnaire was not completed prior to this visit.  Cardiac Risk Factors include: advanced age (>30men, >72 women);dyslipidemia;diabetes mellitus     Objective:    Today's Vitals   11/27/23 1524  Weight: 153 lb (69.4 kg)  Height: 5\' 4"  (1.626 m)   Body mass index is 26.26 kg/m.     11/27/2023    3:31 PM 12/04/2022    1:54 PM 11/26/2022   11:26 AM 11/11/2021    3:18 PM 11/08/2020   11:08 AM 10/27/2019    9:41 AM 04/11/2019   12:37 PM  Advanced Directives  Does Patient Have a Medical Advance Directive? No No No No No No No  Would patient like information on creating a medical advance directive? Yes (MAU/Ambulatory/Procedural Areas - Information given)  No - Patient declined No - Patient declined No - Patient declined No - Patient declined No - Patient declined    Current Medications (verified) Outpatient Encounter Medications as of 11/27/2023  Medication Sig   Artificial Saliva (BIOTENE DRY MOUTH MOISTURIZING) SOLN Use as  directed 5 mLs in the mouth or throat at bedtime.   aspirin EC 81 MG tablet Take 81 mg by mouth daily.   Cholecalciferol (VITAMIN D3) 10 MCG (400 UNIT) CAPS Take by mouth.   diltiazem (CARDIZEM CD) 180 MG 24 hr capsule TAKE ONE CAPSULE ONCE DAILY FOR BLOOD PRESSURE   estradiol (ESTRACE) 0.1 MG/GM vaginal cream APPLY A PEA SIZE AMOUNT PER VAGINA 2-3 NIGHTS A WEEK -FOR VAGINAL USE-   fluticasone (FLONASE) 50 MCG/ACT nasal spray Place 2 sprays into both nostrils daily.   Lancets (ONETOUCH DELICA PLUS LANCET33G) MISC CHECK BLOOD SUGAR ONCE A DAY Dx E11.69   lisinopril (ZESTRIL) 40 MG tablet TAKE ONE TABLET ONCE DAILY FOR BLOOD PRESSURE   metFORMIN (GLUCOPHAGE) 1000 MG tablet Take 1 tablet (1,000 mg total) by mouth daily with breakfast.   mirabegron ER (MYRBETRIQ) 50 MG TB24 tablet Take 1 tablet (50 mg total) by mouth daily.   multivitamin-lutein (OCUVITE-LUTEIN) CAPS capsule Take 1 capsule by mouth daily.   nitroGLYCERIN (NITROSTAT) 0.4 MG SL tablet Place 1 tablet (0.4 mg total) under the tongue every 5 (five) minutes as needed for chest pain.   Omega-3 Fatty Acids (FISH OIL) 1200 MG CAPS Take by mouth.   ONETOUCH ULTRA test strip CHECK BLOOD SUGAR BID Dx E11.69   pantoprazole (PROTONIX) 40 MG tablet Take 1 tablet (40 mg total) by mouth daily.   pravastatin (PRAVACHOL) 40 MG tablet Take 1 tablet (40 mg total) by mouth daily.   sitaGLIPtin (JANUVIA) 50 MG tablet TAKE ONE TABLET ONCE DAILY FOR SUGAR.  sodium chloride (OCEAN) 0.65 % SOLN nasal spray Place 1 spray into both nostrils as needed for congestion.   No facility-administered encounter medications on file as of 11/27/2023.    Allergies (verified) Codeine, Doxycycline, Other, Sulfonamide derivatives, Vancomycin, and Farxiga [dapagliflozin]   History: Past Medical History:  Diagnosis Date   Abnormality of gait 01/29/2016   Aortic root dilatation (HCC)    Mild   Cataract    Diabetes mellitus without complication (HCC)    type 2    Dyslipidemia    Dyspnea    with exertion   Dysrhythmia    hx of occ palpitations   Esophageal stricture    s/p dilation   GERD (gastroesophageal reflux disease)    Headache    History of hiatal hernia    Hyperlipidemia    Hypertension    Macular pucker, left eye    Osteoarthritis    Osteoporosis    Tinnitus    left ear last week or so   Past Surgical History:  Procedure Laterality Date   ABDOMINAL HYSTERECTOMY     partial   APPENDECTOMY     BACK SURGERY     lower x 1   CARDIAC CATHETERIZATION  2010   both cataracts   CHOLECYSTECTOMY  1993   CYSTOSCOPY N/A 08/28/2016   Procedure: CYSTOSCOPY;  Surgeon: Jethro Bolus, MD;  Location: [facility];  Service: Urology;  Laterality: N/A;   EYE SURGERY     cataracts   EYE SURGERY     for macular pucker   FRACTURE SURGERY     rt wrist2010   HARDWARE REMOVAL  12/10/2011   Procedure: HARDWARE REMOVAL;  Surgeon: Marlowe Shores, MD;  Location: [facility];  Service: Orthopedics;  Laterality: Right;  radial head   KNEE ARTHROSCOPY  2004   Left and right   KYPHOSIS SURGERY     LUMBAR FUSION  2000,2003   RADIAL HEAD ARTHROPLASTY  5/12   orif rt radial head   SHOULDER SURGERY     Right rotator cuff epair   TOTAL KNEE ARTHROPLASTY     Left   VAGINAL PROLAPSE REPAIR N/A 08/28/2016   Procedure: ANTERIOR VAGINAL VAULT SUSPENSION WITH SACROSPINOUS FIXATION;  Surgeon: Jethro Bolus, MD;  Location: [facility];  Service: Urology;  Laterality: N/A;   Family History  Problem Relation Age of Onset   Pneumonia Mother    Brain cancer Father    Tremor Father    Arthritis Sister    COPD Sister    Emphysema Brother    COPD Brother    Tremor Brother    Lung cancer Brother    Heart disease Brother    Liver cancer Brother    Diabetes Other         1 child has diabetes at age 44 and the other 4 are healthy   Social History   Socioeconomic History   Marital status: Widowed    Spouse name: Not on file   Number of  children: 5   Years of education: 5   Highest education level: Not on file  Occupational History   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    Comment: Retired  Tobacco Use   Smoking status: Never   Smokeless tobacco: Never  Vaping Use   Vaping status: Never Used  Substance and Sexual Activity   Alcohol use: No   Drug use: No   Sexual activity: Not Currently  Other Topics Concern   Not on  file  Social History Narrative   Pt lives at home alone   Husband w/ Alzheimer's passed away   Right-handed   Drinks 1-2 cups of coffee daily   Social Drivers of Health   Financial Resource Strain: Low Risk  (11/27/2023)   Overall Financial Resource Strain (CARDIA)    Difficulty of Paying Living Expenses: Not hard at all  Food Insecurity: No Food Insecurity (11/27/2023)   Hunger Vital Sign    Worried About Running Out of Food in the Last Year: Never true    Ran Out of Food in the Last Year: Never true  Transportation Needs: No Transportation Needs (11/27/2023)   PRAPARE - Administrator, Civil Service (Medical): No    Lack of Transportation (Non-Medical): No  Physical Activity: Inactive (11/27/2023)   Exercise Vital Sign    Days of Exercise per Week: 0 days    Minutes of Exercise per Session: 0 min  Stress: No Stress Concern Present (11/27/2023)   [employer] of Occupational Health - Occupational Stress Questionnaire    Feeling of Stress : Not at all  Social Connections: Moderately Isolated (11/27/2023)   Social Connection and Isolation Panel [NHANES]    Frequency of Communication with Friends and Family: More than three times a week    Frequency of Social Gatherings with Friends and Family: Three times a week    Attends Religious Services: More than 4 times per year    Active Member of Clubs or Organizations: No    Attends Banker Meetings: Never    Marital Status: Widowed    Tobacco Counseling Counseling given: Not Answered    Clinical  Intake:  Pre-visit preparation completed: Yes  Pain : No/denies pain     Diabetes: Yes CBG done?: No Did pt. bring in CBG monitor from home?: No  Lab Results  Component Value Date   HGBA1C 7.0 (H) 11/16/2023   HGBA1C 7.6 (H) 07/17/2023   HGBA1C 8.0 (H) 03/16/2023     How often do you need to have someone help you when you read instructions, pamphlets, or other written materials from your doctor or pharmacy?: 1 - Never  Interpreter Needed?: No  Information entered by :: Kandis Fantasia LPN   Activities of Daily Living     11/27/2023    3:29 PM  In your present state of health, do you have any difficulty performing the following activities:  Hearing? 0  Vision? 0  Difficulty concentrating or making decisions? 0  Walking or climbing stairs? 0  Dressing or bathing? 0  Doing errands, shopping? 0  Preparing Food and eating ? N  Using the Toilet? N  In the past six months, have you accidently leaked urine? N  Do you have problems with loss of bowel control? N  Managing your Medications? N  Managing your Finances? N  Housekeeping or managing your Housekeeping? N    Patient Care Team: Raliegh Ip, DO as PCP - General (Family Medicine) Rollene Rotunda, MD as PCP - Cardiology (Cardiology) Bjorn Pippin, MD as Consulting Physician (Urology) Rollene Rotunda, MD as Consulting Physician (Cardiology) Trey Sailors, MD as Attending Physician (Neurosurgery) Peggye Pitt, MD as Consulting Physician (Pain Medicine) Princess Bruins, MD as Consulting Physician (Ophthalmology) Jena Gauss Gerrit Friends, MD as Consulting Physician (Gastroenterology) Evelena Leyden, OD (Optometry)  Indicate any recent Medical Services you may have received from other than [facility] providers in the past year (date may be approximate).     Assessment:   This  is a routine wellness examination for Debra Phillips.  Hearing/Vision screen Hearing Screening - Comments:: Denies hearing difficulties   Vision  Screening - Comments:: Wears rx glasses - up to date with routine eye exams with eye specialist    Goals Addressed   None    Depression Screen     11/27/2023    3:27 PM 11/16/2023    2:27 PM 09/08/2023   11:30 AM 07/17/2023    1:53 PM 06/26/2023   11:26 AM 03/16/2023   11:32 AM 11/26/2022   11:26 AM  PHQ 2/9 Scores  PHQ - 2 Score 2 2 2 2 2 2  0  PHQ- 9 Score 4 4 6 3 6 6      Fall Risk     11/27/2023    3:29 PM 11/16/2023    2:39 PM 09/08/2023   11:30 AM 06/26/2023   11:26 AM 03/16/2023   11:32 AM  Fall Risk   Falls in the past year? 0 0 1 0 1  Number falls in past yr: 0 0 0  0  Injury with Fall? 0 0 0  1  Risk for fall due to : No Fall Risks No Fall Risks History of fall(s)    Follow up Falls prevention discussed;Education provided;Falls evaluation completed Falls evaluation completed Education provided  Falls prevention discussed    MEDICARE RISK AT HOME:  Medicare Risk at Home Any stairs in or around the home?: No If so, are there any without handrails?: No Home free of loose throw rugs in walkways, pet beds, electrical cords, etc?: Yes Adequate lighting in your home to reduce risk of falls?: Yes Life alert?: No Use of a cane, walker or w/c?: No Grab bars in the bathroom?: Yes Shower chair or bench in shower?: No Elevated toilet seat or a handicapped toilet?: Yes  TIMED UP AND GO:  Was the test performed?  No   Cognitive Function: 6CIT completed    02/04/2022    5:00 PM 02/15/2018    4:23 PM 06/15/2015   12:40 PM  MMSE - Mini Mental State Exam  Orientation to time 2 5 4   Orientation to Place 2 5 5   Registration 2 3 3   Attention/ Calculation 4 4 5   Recall 0 1 2  Language- name 2 objects 2 2 2   Language- repeat 1 1 1   Language- follow 3 step command 3 3 3   Language- read & follow direction 1 1 1   Write a sentence 1 1 1   Copy design 1 1 1   Total score 19 27 28         11/27/2023    3:30 PM 11/26/2022   11:27 AM 11/11/2021    3:12 PM 11/08/2020   10:55 AM 10/27/2019     9:43 AM  6CIT Screen  What Year? 0 points 0 points 0 points 0 points 0 points  What month? 0 points 0 points 0 points 0 points 0 points  What time? 3 points 3 points 0 points 0 points 0 points  Count back from 20 2 points 2 points 0 points 0 points 0 points  Months in reverse 2 points 2 points 2 points 0 points 0 points  Repeat phrase 2 points 4 points 10 points 6 points 2 points  Total Score 9 points 11 points 12 points 6 points 2 points    Immunizations Immunization History  Administered Date(s) Administered   Fluad Quad(high Dose 65+) 07/20/2019, 05/24/2021, 09/05/2022   Hepatitis B 07/02/1998, 08/06/1998, 01/07/1999   Influenza  Inj Mdck Quad With Preservative 07/13/2020   Influenza Split 05/31/2010, 06/11/2011, 06/06/2013, 07/10/2021   Influenza, High Dose Seasonal PF 06/13/2016, 06/18/2017, 06/29/2018   Influenza, Seasonal, Injecte, Preservative Fre 06/29/2009, 06/04/2012   Influenza,inj,Quad PF,6+ Mos 06/15/2015   Influenza-Unspecified 06/17/1996, 06/14/1997, 06/12/1998, 05/31/1999, 06/15/2001, 06/09/2002, 06/07/2003, 04/25/2013, 05/10/2014, 06/15/2015, 06/13/2016, 06/18/2017, 06/29/2018, 07/13/2020   Moderna Sars-Covid-2 Vaccination 10/10/2019, 11/21/2019   Pneumococcal Conjugate-13 08/24/2013   Pneumococcal Polysaccharide-23 06/17/1996   Td 02/17/2017   Td (Adult), 2 Lf Tetanus Toxid, Preservative Free 02/17/2017   Tdap 02/17/2017   Zoster, Live 03/17/2014    Screening Tests Health Maintenance  Topic Date Due   OPHTHALMOLOGY EXAM  07/23/2022   COVID-19 Vaccine (3 - 2024-25 season) 12/02/2023 (Originally 04/26/2023)   Zoster Vaccines- Shingrix (1 of 2) 02/16/2024 (Originally [birth year]/13/1980)   MAMMOGRAM  11/15/2024 (Originally 02/16/2020)   FOOT EXAM  03/15/2024   INFLUENZA VACCINE  03/25/2024   HEMOGLOBIN A1C  05/18/2024   Medicare Annual Wellness (AWV)  11/26/2024   DTaP/Tdap/Td (4 - Td or Tdap) 02/18/2027   Pneumonia Vaccine 51+ Years old  Completed   DEXA SCAN   Completed   HPV VACCINES  Aged Out    Health Maintenance  Health Maintenance Due  Topic Date Due   OPHTHALMOLOGY EXAM  07/23/2022   Health Maintenance Items Addressed: Records requested for diabetic eye exam   Additional Screening:  Vision Screening: Recommended annual ophthalmology exams for early detection of glaucoma and other disorders of the eye.  Dental Screening: Recommended annual dental exams for proper oral hygiene  Community Resource Referral / Chronic Care Management: CRR required this visit?  No   CCM required this visit?  No     Plan:     I have personally reviewed and noted the following in the patient's chart:   Medical and social history Use of alcohol, tobacco or illicit drugs  Current medications and supplements including opioid prescriptions. Patient is not currently taking opioid prescriptions. Functional ability and status Nutritional status Physical activity Advanced directives List of other physicians Hospitalizations, surgeries, and ER visits in previous 12 months Vitals Screenings to include cognitive, depression, and falls Referrals and appointments  In addition, I have reviewed and discussed with patient certain preventive protocols, quality metrics, and best practice recommendations. A written personalized care plan for preventive services as well as general preventive health recommendations were provided to patient.     Kandis Fantasia [place], [place]   12/28/3873   After Visit Summary: (Declined) Due to this being a telephonic visit, with patients personalized plan was offered to patient but patient Declined AVS at this time   Notes: Nothing significant to report at this time.

## 2023-11-27 NOTE — Patient Instructions (Signed)
 Debra Phillips , Thank you for taking time to come for your Medicare Wellness Visit. I appreciate your ongoing commitment to your health goals. Please review the following plan we discussed and let me know if I can assist you in the future.   Referrals/Orders/Follow-Ups/Clinician Recommendations: Aim for 30 minutes of exercise or brisk walking, 6-8 glasses of water, and 5 servings of fruits and vegetables each day.  This is a list of the screening recommended for you and due dates:  Health Maintenance  Topic Date Due   Eye exam for diabetics  07/23/2022   COVID-19 Vaccine (3 - 2024-25 season) 12/02/2023*   Zoster (Shingles) Vaccine (1 of 2) 02/16/2024*   Mammogram  11/15/2024*   Complete foot exam   03/15/2024   Flu Shot  03/25/2024   Hemoglobin A1C  05/18/2024   Medicare Annual Wellness Visit  11/26/2024   DTaP/Tdap/Td vaccine (4 - Td or Tdap) 02/18/2027   Pneumonia Vaccine  Completed   DEXA scan (bone density measurement)  Completed   HPV Vaccine  Aged Out  *Topic was postponed. The date shown is not the original due date.    Advanced directives: (ACP Link)Information on Advanced Care Planning can be found at [facility] of [place] Advance Health Care Directives Advance Health Care Directives. [email or web address]   Next Medicare Annual Wellness Visit scheduled for next year: Yes

## 2024-01-25 ENCOUNTER — Other Ambulatory Visit: Payer: Self-pay | Admitting: Family Medicine

## 2024-01-25 DIAGNOSIS — E1159 Type 2 diabetes mellitus with other circulatory complications: Secondary | ICD-10-CM

## 2024-02-01 ENCOUNTER — Other Ambulatory Visit: Payer: Self-pay | Admitting: Family Medicine

## 2024-02-04 ENCOUNTER — Encounter: Payer: Self-pay | Admitting: Family Medicine

## 2024-02-04 ENCOUNTER — Ambulatory Visit: Admitting: Family Medicine

## 2024-02-04 ENCOUNTER — Ambulatory Visit (HOSPITAL_COMMUNITY)
Admission: RE | Admit: 2024-02-04 | Discharge: 2024-02-04 | Disposition: A | Discharge status: Home / Self Care | Source: Ambulatory Visit | Attending: Family Medicine | Admitting: Family Medicine

## 2024-02-04 VITALS — BP 144/78 | HR 84 | Temp 97.1°F | Ht 64.0 in | Wt 156.2 lb

## 2024-02-04 DIAGNOSIS — Y92009 Unspecified place in unspecified non-institutional (private) residence as the place of occurrence of the external cause: Secondary | ICD-10-CM | POA: Diagnosis not present

## 2024-02-04 DIAGNOSIS — M25552 Pain in left hip: Secondary | ICD-10-CM

## 2024-02-04 DIAGNOSIS — I878 Other specified disorders of veins: Secondary | ICD-10-CM | POA: Diagnosis not present

## 2024-02-04 DIAGNOSIS — M1909 Primary osteoarthritis, other specified site: Secondary | ICD-10-CM | POA: Insufficient documentation

## 2024-02-04 DIAGNOSIS — W19XXXA Unspecified fall, initial encounter: Secondary | ICD-10-CM | POA: Diagnosis not present

## 2024-02-04 DIAGNOSIS — Z96652 Presence of left artificial knee joint: Secondary | ICD-10-CM | POA: Diagnosis not present

## 2024-02-04 DIAGNOSIS — M160 Bilateral primary osteoarthritis of hip: Secondary | ICD-10-CM | POA: Insufficient documentation

## 2024-02-04 DIAGNOSIS — Z471 Aftercare following joint replacement surgery: Secondary | ICD-10-CM | POA: Diagnosis not present

## 2024-02-04 DIAGNOSIS — M25562 Pain in left knee: Secondary | ICD-10-CM

## 2024-02-04 NOTE — Progress Notes (Signed)
 BP (!) 144/78   Pulse 84   Temp (!) 97.1 F (36.2 C) (Temporal)   Ht 5' 4 (1.626 m)   Wt 156 lb 3.2 oz (70.9 kg)   SpO2 98%   BMI 26.81 kg/m    Subjective:   Patient ID: Debra Phillips, female    DOB: 22-Sep-1928, 89 y.o.   MRN: [id number]  HPI: Debra Phillips is a 89 y.o. female presenting on 02/04/2024 for Fall Debra Phillips last night, now having left leg and knee swelling and bruising, some soreness  )   HPI Left knee and hip pain Patient had a fall last night where she slipped off the end of her chair and fell onto her left side.  She says she has macular degeneration and that is why she does not see as well and that is why she thinks she missed the chair.  She still having pain in the left hip and left knee although she is able to weight-bear and walk on it it does hurt some.  She has bruising all of the left side of the knee and some on the left hip.  She has a little soreness in her left shoulder but not much and she is able to move that just fine.  She has a history of a knee replacement on the left knee.  Relevant past medical, surgical, family and social history reviewed and updated as indicated. Interim medical history since our last visit reviewed. Allergies and medications reviewed and updated.  Review of Systems  Constitutional:  Negative for chills and fever.  Eyes:  Negative for redness and visual disturbance.  Respiratory:  Negative for chest tightness and shortness of breath.   Cardiovascular:  Negative for chest pain and leg swelling.  Genitourinary:  Negative for difficulty urinating and dysuria.  Musculoskeletal:  Positive for arthralgias and gait problem. Negative for back pain and myalgias.  Skin:  Negative for rash.  Neurological:  Negative for dizziness, light-headedness and headaches.  Psychiatric/Behavioral:  Negative for agitation and behavioral problems.   All other systems reviewed and are negative.   Per HPI unless specifically indicated  above   Allergies as of 02/04/2024       Reactions   Codeine Nausea And Vomiting, Other (See Comments)   Doxycycline Hives   Other Nausea Only   Almost all antibiotics   Sulfonamide Derivatives Hives   Vancomycin Itching   Farxiga  [dapagliflozin ]    Recurrent UTIs        Medication List        Accurate as of February 04, 2024  4:17 PM. If you have any questions, ask your nurse or doctor.          aspirin  EC 81 MG tablet Take 81 mg by mouth daily.   Biotene Dry Mouth Moisturizing Soln Use as directed 5 mLs in the mouth or throat at bedtime.   diltiazem  180 MG 24 hr capsule Commonly known as: CARDIZEM  CD TAKE ONE CAPSULE ONCE DAILY FOR BLOOD PRESSURE   estradiol  0.1 MG/GM vaginal cream Commonly known as: ESTRACE  APPLY A PEA SIZE AMOUNT PER VAGINA 2-3 NIGHTS A WEEK -FOR VAGINAL USE-   Fish Oil 1200 MG Caps Take by mouth.   fluticasone  50 MCG/ACT nasal spray Commonly known as: FLONASE  Place 2 sprays into both nostrils daily.   lisinopril  40 MG tablet Commonly known as: ZESTRIL  TAKE ONE TABLET ONCE DAILY FOR BLOOD PRESSURE   metFORMIN  1000 MG tablet Commonly known as: GLUCOPHAGE  Take 1  tablet (1,000 mg total) by mouth daily with breakfast.   mirabegron  ER 50 MG Tb24 tablet Commonly known as: Myrbetriq  Take 1 tablet (50 mg total) by mouth daily.   multivitamin-lutein Caps capsule Take 1 capsule by mouth daily.   nitroGLYCERIN  0.4 MG SL tablet Commonly known as: NITROSTAT  Place 1 tablet (0.4 mg total) under the tongue every 5 (five) minutes as needed for chest pain.   OneTouch Delica Plus Lancet33G Misc CHECK BLOOD SUGAR ONCE A DAY Dx E11.69   OneTouch Ultra test strip Generic drug: glucose blood CHECK BLOOD SUGAR BID Dx E11.69   pantoprazole  40 MG tablet Commonly known as: PROTONIX  Take 1 tablet (40 mg total) by mouth daily.   pravastatin  40 MG tablet Commonly known as: PRAVACHOL  Take 1 tablet (40 mg total) by mouth daily.   sitaGLIPtin  50 MG  tablet Commonly known as: Januvia  TAKE ONE TABLET ONCE DAILY FOR SUGAR.   sodium chloride  0.65 % Soln nasal spray Commonly known as: OCEAN Place 1 spray into both nostrils as needed for congestion.   Vitamin D3 10 MCG (400 UNIT) Caps Take by mouth.         Objective:   BP (!) 144/78   Pulse 84   Temp (!) 97.1 F (36.2 C) (Temporal)   Ht 5' 4 (1.626 m)   Wt 156 lb 3.2 oz (70.9 kg)   SpO2 98%   BMI 26.81 kg/m   Wt Readings from Last 3 Encounters:  02/04/24 156 lb 3.2 oz (70.9 kg)  11/27/23 153 lb (69.4 kg)  11/16/23 153 lb 3.2 oz (69.5 kg)    Physical Exam Vitals and nursing note reviewed.  Constitutional:      General: She is not in acute distress.    Appearance: She is well-developed. She is not diaphoretic.   Eyes:     Conjunctiva/sclera: Conjunctivae normal.    Musculoskeletal:        General: Normal range of motion.     Lumbar back: No deformity, lacerations, spasms, tenderness or bony tenderness. Normal range of motion. Negative right straight leg raise test and negative left straight leg raise test.     Left hip: Tenderness (Lateral tenderness and slight bruising.) present.     Left knee: Swelling and ecchymosis present. No deformity or erythema. Tenderness present over the lateral joint line.     Left lower leg: Swelling present. No deformity. 2+ Edema present.   Skin:    General: Skin is warm and dry.     Findings: No rash.   Neurological:     Mental Status: She is alert and oriented to person, place, and time.     Coordination: Coordination normal.   Psychiatric:        Behavior: Behavior normal.       Assessment & Plan:   Problem List Items Addressed This Visit   None Visit Diagnoses       Fall in home, initial encounter    -  Primary   Relevant Orders   DG Knee 1-2 Views Left   DG HIP UNILAT W OR W/O PELVIS 2-3 VIEWS LEFT     Left hip pain       Relevant Orders   DG Knee 1-2 Views Left   DG HIP UNILAT W OR W/O PELVIS 2-3 VIEWS  LEFT     Acute pain of left knee       Relevant Orders   DG Knee 1-2 Views Left   DG HIP UNILAT W OR  W/O PELVIS 2-3 VIEWS LEFT       Will send patient out for x-ray because we do not have x-ray here in the house and will watch for that to see if there is any sign of fracture. Follow up plan: Return if symptoms worsen or fail to improve.  Counseling provided for all of the vaccine components Orders Placed This Encounter  Procedures   DG Knee 1-2 Views Left   DG HIP UNILAT W OR W/O PELVIS 2-3 VIEWS LEFT    Jolyne Needs, MD [facility] 02/04/2024, 4:17 PM

## 2024-02-08 ENCOUNTER — Ambulatory Visit: Payer: Self-pay | Admitting: Family Medicine

## 2024-02-10 ENCOUNTER — Encounter: Payer: Self-pay | Admitting: Nurse Practitioner

## 2024-02-10 ENCOUNTER — Ambulatory Visit: Admitting: Nurse Practitioner

## 2024-02-10 VITALS — BP 167/78 | HR 75 | Temp 97.4°F | Ht 64.0 in | Wt 157.8 lb

## 2024-02-10 DIAGNOSIS — J309 Allergic rhinitis, unspecified: Secondary | ICD-10-CM

## 2024-02-10 DIAGNOSIS — R300 Dysuria: Secondary | ICD-10-CM | POA: Diagnosis not present

## 2024-02-10 DIAGNOSIS — N3001 Acute cystitis with hematuria: Secondary | ICD-10-CM | POA: Diagnosis not present

## 2024-02-10 LAB — URINALYSIS, ROUTINE W REFLEX MICROSCOPIC
Bilirubin, UA: NEGATIVE
Nitrite, UA: POSITIVE — AB
Specific Gravity, UA: 1.02 (ref 1.005–1.030)
Urobilinogen, Ur: 0.2 mg/dL (ref 0.2–1.0)
pH, UA: 6 (ref 5.0–7.5)

## 2024-02-10 LAB — MICROSCOPIC EXAMINATION
Renal Epithel, UA: NONE SEEN /HPF
Yeast, UA: NONE SEEN

## 2024-02-10 MED ORDER — NITROFURANTOIN MONOHYD MACRO 100 MG PO CAPS: 100.0000 mg | ORAL_CAPSULE | Freq: Two times a day (BID) | ORAL | 0 refills | Status: DC

## 2024-02-10 MED ORDER — FLUCONAZOLE 150 MG PO TABS: 150.0000 mg | ORAL_TABLET | Freq: Once | ORAL | 0 refills | Status: AC

## 2024-02-10 MED ORDER — LEVOCETIRIZINE DIHYDROCHLORIDE 5 MG PO TABS
2.5000 mg | ORAL_TABLET | Freq: Every evening | ORAL | 0 refills | Status: DC
Start: 2024-02-10 — End: 2024-07-19

## 2024-02-10 NOTE — Progress Notes (Signed)
 Acute Office Visit  Subjective:     Patient ID: Debra Phillips, female    DOB: 15-Jun-1929, 89 y.o.   MRN: [id number]  Chief Complaint  Patient presents with   Cough    Cough for 2 days    Leg Swelling    Leg swelling and bruising from fall last week     HPI Debra Phillips is a 89 yrs old female presents with her daughter concerns for dysuria and cough urinary frequency, urgency and dysuria x 6 days, without flank pain, fever, chills, or abnormal vaginal discharge or bleeding.   Cough: Patient complains of nonproductive cough.  Symptoms began 3 days ago.  The cough is non-productive, without wheezing, dyspnea or hemoptysis and is aggravated by nothing Associated symptoms include:change in voice, postnasal drip, and nasal congestion. Patient does not have a history of asthma. Patient  have a history of environmental allergens. Patient does not have recent travel. Patient  have a history of smoking.    Active Ambulatory Problems    Diagnosis Date Noted   Hyperlipidemia associated with type 2 diabetes mellitus (HCC) 12/12/2008   Hypertension associated with diabetes (HCC) 12/12/2008   Coronary atherosclerosis 12/12/2009   Aortic aneurysm (HCC) 06/27/2009   GERD 12/12/2008   Osteoarthritis 12/12/2008   PALPITATIONS 02/27/2010   DYSPNEA 06/27/2009   Osteoporotic compression fracture of spine with routine healing 03/08/2014   Solitary pulmonary nodule 03/20/2014   Diabetes mellitus treated with oral medication (HCC) 04/26/2014   Vitamin D  deficiency 04/26/2014   Chronic cystitis 04/26/2014   Atrophic vaginitis 04/26/2014   Spondylosis of lumbar region without myelopathy or radiculopathy 09/01/2014   Osteoarthritis of lumbar spine 05/08/2015   Tremor 01/29/2016   Abnormality of gait 01/29/2016   Pelvic prolapse 08/28/2016   Age-related macular degeneration 07/13/2015   Keratoconjunctivitis sicca of both eyes 07/13/2015   Meibomian gland dysfunction (MGD), bilateral, both  upper and lower lids 07/13/2015   Pseudophakia of both eyes 07/13/2015   Sinus congestion 10/16/2020   Compression fracture of vertebral column (HCC) 03/08/2014   Meibomian gland dysfunction 07/13/2015   Allergic rhinitis 02/10/2024   Dysuria 02/10/2024   Resolved Ambulatory Problems    Diagnosis Date Noted   Precordial pain 02/16/2012   Chest pain 03/18/2014   Type 2 diabetes mellitus with hyperlipidemia (HCC) 01/14/2016   Dysuria 01/22/2017   Hyponatremia 12/22/2017   Vaginal yeast infection 04/22/2021   Dyslipidemia due to type 2 diabetes mellitus (HCC) 12/12/2008   Past Medical History:  Diagnosis Date   Aortic root dilatation (HCC)    Cataract    Diabetes mellitus without complication (HCC)    Dyslipidemia    Dyspnea    Dysrhythmia    Esophageal stricture    GERD (gastroesophageal reflux disease)    Headache    History of hiatal hernia    Hyperlipidemia    Hypertension    Macular pucker, left eye    Osteoporosis    Tinnitus       .  Review of Systems  Constitutional:  Negative for chills and fever.  HENT:  Negative for congestion and sore throat.   Respiratory:  Negative for cough and shortness of breath.   Cardiovascular:  Negative for chest pain and leg swelling.       Left  Gastrointestinal:  Negative for nausea and vomiting.  Genitourinary:  Positive for frequency and urgency.  Skin:  Negative for itching and rash.  Neurological:  Negative for dizziness and headaches.   Negative  unless indicated in HPI    Objective:    BP (!) 167/78   Pulse 75   Temp (!) 97.4 F (36.3 C) (Temporal)   Ht 5' 4 (1.626 m)   Wt 157 lb 12.8 oz (71.6 kg)   SpO2 98%   BMI 27.09 kg/m  BP Readings from Last 3 Encounters:  02/10/24 (!) 167/78  02/04/24 (!) 144/78  11/16/23 (!) 141/83   Wt Readings from Last 3 Encounters:  02/10/24 157 lb 12.8 oz (71.6 kg)  02/04/24 156 lb 3.2 oz (70.9 kg)  11/27/23 153 lb (69.4 kg)      Physical Exam Vitals and nursing note  reviewed.  Constitutional:      General: She is not in acute distress. HENT:     Head: Atraumatic.     Nose: Nose normal.   Eyes:     Extraocular Movements: Extraocular movements intact.     Conjunctiva/sclera: Conjunctivae normal.     Pupils: Pupils are equal, round, and reactive to light.    Cardiovascular:     Heart sounds: Normal heart sounds.  Pulmonary:     Effort: Pulmonary effort is normal.     Breath sounds: Normal breath sounds.  Abdominal:     Tenderness: There is no right CVA tenderness, left CVA tenderness or guarding.   Musculoskeletal:     Right lower leg: No edema.     Left lower leg: No edema.   Skin:    General: Skin is warm and dry.     Findings: Erythema present.     Comments: Left leg from recent fall   Neurological:     Mental Status: She is alert and oriented to person, place, and time.   Psychiatric:        Mood and Affect: Mood normal.        Behavior: Behavior normal.        Thought Content: Thought content normal.        Judgment: Judgment normal.     Urine dipstick shows positive for WBC's, positive for RBC's, positive for protein, positive for glucose, positive for nitrates, positive for leukocytes, and positive for ketones.  Micro exam: 11-30 WBC's per HPF,0-2 RBC's per HPF, many+ bacteria, and epithelial cells.  No results found for any visits on 02/10/24.      Assessment & Plan:  Dysuria -     Urinalysis, Routine w reflex microscopic -     Fluconazole ; Take 1 tablet (150 mg total) by mouth once for 1 dose.  Dispense: 1 tablet; Refill: 0 -     Nitrofurantoin  Monohyd Macro; Take 1 capsule (100 mg total) by mouth 2 (two) times daily. 1 po BId  Dispense: 14 capsule; Refill: 0 -     Urine Culture  Allergic rhinitis, unspecified seasonality, unspecified trigger -     Levocetirizine Dihydrochloride; Take 0.5 tablets (2.5 mg total) by mouth every evening.  Dispense: 45 tablet; Refill: 0  Acute cystitis with hematuria -     Fluconazole ;  Take 1 tablet (150 mg total) by mouth once for 1 dose.  Dispense: 1 tablet; Refill: 0 -     Nitrofurantoin  Monohyd Macro; Take 1 capsule (100 mg total) by mouth 2 (two) times daily. 1 po BId  Dispense: 14 capsule; Refill: 0 -     Urine Culture  Debra Phillips is a 89 yrs old Caucasian female seen for UTI, no acute distress Will treat with Microbid Bid for 7 days while waiting for culture. She understands based  on culture results an new ATB may be needed Increase hydration, may use OTC pyridium Allergic rhinitis: Xyzal 2.5 at bedtime Advised to get tedhose to improve circulation   The above assessment and management plan was discussed with the patient. The patient verbalized understanding of and has agreed to the management plan. Patient is aware to call the clinic if they develop any new symptoms or if symptoms persist or worsen. Patient is aware when to return to the clinic for a follow-up visit. Patient educated on when it is appropriate to go to the emergency department.  Return if symptoms worsen or fail to improve.  Debra Wisby [place] Thompson, DNP [facility] [address] [place], [place] [address] [phone]  Note: This document was prepared by [employer] voice dictation technology and any errors that results from this process are unintentional.

## 2024-02-11 ENCOUNTER — Ambulatory Visit: Payer: Self-pay | Admitting: Nurse Practitioner

## 2024-02-13 LAB — URINE CULTURE

## 2024-03-18 ENCOUNTER — Ambulatory Visit: Payer: Self-pay | Admitting: Family Medicine

## 2024-03-18 ENCOUNTER — Ambulatory Visit (INDEPENDENT_AMBULATORY_CARE_PROVIDER_SITE_OTHER): Admitting: Family Medicine

## 2024-03-18 ENCOUNTER — Encounter: Payer: Self-pay | Admitting: Family Medicine

## 2024-03-18 ENCOUNTER — Ambulatory Visit: Admitting: Family Medicine

## 2024-03-18 VITALS — BP 126/79 | HR 83 | Temp 98.7°F | Ht 64.0 in | Wt 152.0 lb

## 2024-03-18 DIAGNOSIS — E876 Hypokalemia: Secondary | ICD-10-CM

## 2024-03-18 DIAGNOSIS — E119 Type 2 diabetes mellitus without complications: Secondary | ICD-10-CM

## 2024-03-18 DIAGNOSIS — E785 Hyperlipidemia, unspecified: Secondary | ICD-10-CM

## 2024-03-18 DIAGNOSIS — I152 Hypertension secondary to endocrine disorders: Secondary | ICD-10-CM

## 2024-03-18 DIAGNOSIS — Z7984 Long term (current) use of oral hypoglycemic drugs: Secondary | ICD-10-CM

## 2024-03-18 DIAGNOSIS — E1159 Type 2 diabetes mellitus with other circulatory complications: Secondary | ICD-10-CM

## 2024-03-18 DIAGNOSIS — R2242 Localized swelling, mass and lump, left lower limb: Secondary | ICD-10-CM

## 2024-03-18 DIAGNOSIS — E1169 Type 2 diabetes mellitus with other specified complication: Secondary | ICD-10-CM | POA: Diagnosis not present

## 2024-03-18 LAB — BAYER DCA HB A1C WAIVED: HB A1C (BAYER DCA - WAIVED): 8.3 % — ABNORMAL HIGH (ref 4.8–5.6)

## 2024-03-18 NOTE — Progress Notes (Signed)
 Subjective: CC:DM PCP: Jolinda Norene HERO, DO YEP:Debra Phillips is a 89 y.o. female presenting to clinic today for:  1. Type 2 Diabetes with hypertension, hyperlipidemia with acute onset of left lower extremity swelling:  She is compliant with medication.  No reports of hypoglycemia.  Diabetes Health Maintenance Due  Topic Date Due   OPHTHALMOLOGY EXAM  07/23/2022   FOOT EXAM  03/15/2024   HEMOGLOBIN A1C  05/18/2024    Last A1c:  Lab Results  Component Value Date   HGBA1C 7.0 (H) 11/16/2023    ROS: No chest pain, shortness of breath.  She does have visual disturbance and has history of macular degeneration and sees retinal specialist regularly for this.  She reports bilateral lower extremity edema which is chronic for her but over the last couple of weeks she has had increasing left lower extremity edema and pain.  This apparently occurred after sustaining a fall from a very short stool.  She had imaging of that leg and hip which showed no acute fractures.  She has been seen on a couple of occasions since that time for ongoing pain and swelling and was advised to use compression hose, which she has been doing.  No advanced imaging has been obtained   ROS: Per HPI  Allergies  Allergen Reactions   Codeine Nausea And Vomiting and Other (See Comments)   Doxycycline Hives   Other Nausea Only    Almost all antibiotics   Sulfonamide Derivatives Hives   Vancomycin Itching   Farxiga  [Dapagliflozin ]     Recurrent UTIs   Past Medical History:  Diagnosis Date   Abnormality of gait 01/29/2016   Aortic root dilatation (HCC)    Mild   Cataract    Diabetes mellitus without complication (HCC)    type 2   Dyslipidemia    Dyspnea    with exertion   Dysrhythmia    hx of occ palpitations   Esophageal stricture    s/p dilation   GERD (gastroesophageal reflux disease)    Headache    History of hiatal hernia    Hyperlipidemia    Hypertension    Macular pucker, left eye     Osteoarthritis    Osteoporosis    Tinnitus    left ear last week or so    Current Outpatient Medications:    Artificial Saliva (BIOTENE DRY MOUTH MOISTURIZING) SOLN, Use as directed 5 mLs in the mouth or throat at bedtime., Disp: 44.3 mL, Rfl: 1   aspirin  EC 81 MG tablet, Take 81 mg by mouth daily., Disp: , Rfl:    Cholecalciferol  (VITAMIN D3) 10 MCG (400 UNIT) CAPS, Take by mouth., Disp: , Rfl:    diltiazem  (CARDIZEM  CD) 180 MG 24 hr capsule, TAKE ONE CAPSULE ONCE DAILY FOR BLOOD PRESSURE, Disp: 90 capsule, Rfl: 0   estradiol  (ESTRACE ) 0.1 MG/GM vaginal cream, APPLY A PEA SIZE AMOUNT PER VAGINA 2-3 NIGHTS A WEEK -FOR VAGINAL USE-, Disp: 42.5 g, Rfl: 6   fluticasone  (FLONASE ) 50 MCG/ACT nasal spray, Place 2 sprays into both nostrils daily., Disp: 16 g, Rfl: 6   Lancets (ONETOUCH DELICA PLUS LANCET33G) MISC, CHECK BLOOD SUGAR ONCE A DAY Dx E11.69, Disp: 100 each, Rfl: 4   levocetirizine (XYZAL ) 5 MG tablet, Take 0.5 tablets (2.5 mg total) by mouth every evening., Disp: 45 tablet, Rfl: 0   lisinopril  (ZESTRIL ) 40 MG tablet, TAKE ONE TABLET ONCE DAILY FOR BLOOD PRESSURE, Disp: 90 tablet, Rfl: 0   metFORMIN  (GLUCOPHAGE ) 1000 MG  tablet, Take 1 tablet (1,000 mg total) by mouth daily with breakfast., Disp: 90 tablet, Rfl: 4   mirabegron  ER (MYRBETRIQ ) 50 MG TB24 tablet, Take 1 tablet (50 mg total) by mouth daily., Disp: 90 tablet, Rfl: 4   multivitamin-lutein (OCUVITE-LUTEIN) CAPS capsule, Take 1 capsule by mouth daily., Disp: , Rfl:    nitrofurantoin , macrocrystal-monohydrate, (MACROBID ) 100 MG capsule, Take 1 capsule (100 mg total) by mouth 2 (two) times daily. 1 po BId, Disp: 14 capsule, Rfl: 0   nitroGLYCERIN  (NITROSTAT ) 0.4 MG SL tablet, Place 1 tablet (0.4 mg total) under the tongue every 5 (five) minutes as needed for chest pain., Disp: 25 tablet, Rfl: 3   Omega-3 Fatty Acids (FISH OIL) 1200 MG CAPS, Take by mouth., Disp: , Rfl:    ONETOUCH ULTRA test strip, CHECK BLOOD SUGAR BID Dx E11.69,  Disp: 100 strip, Rfl: 4   pantoprazole  (PROTONIX ) 40 MG tablet, Take 1 tablet (40 mg total) by mouth daily., Disp: 90 tablet, Rfl: 4   pravastatin  (PRAVACHOL ) 40 MG tablet, Take 1 tablet (40 mg total) by mouth daily., Disp: 90 tablet, Rfl: 3   sitaGLIPtin  (JANUVIA ) 50 MG tablet, TAKE ONE TABLET ONCE DAILY FOR SUGAR., Disp: 90 tablet, Rfl: 4   sodium chloride  (OCEAN) 0.65 % SOLN nasal spray, Place 1 spray into both nostrils as needed for congestion., Disp: 30 mL, Rfl: 0 Social History   Socioeconomic History   Marital status: Widowed    Spouse name: Not on file   Number of children: 5   Years of education: 5   Highest education level: Not on file  Occupational History   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    Comment: Retired  Tobacco Use   Smoking status: Never   Smokeless tobacco: Never  Vaping Use   Vaping status: Never Used  Substance and Sexual Activity   Alcohol  use: No   Drug use: No   Sexual activity: Not Currently  Other Topics Concern   Not on file  Social History Narrative   Pt lives at home alone   Husband w/ Alzheimer's passed away   Right-handed   Drinks 1-2 cups of coffee daily   Social Drivers of Corporate investment banker Strain: Low Risk  (11/27/2023)   Overall Financial Resource Strain (CARDIA)    Difficulty of Paying Living Expenses: Not hard at all  Food Insecurity: No Food Insecurity (11/27/2023)   Hunger Vital Sign    Worried About Running Out of Food in the Last Year: Never true    Ran Out of Food in the Last Year: Never true  Transportation Needs: No Transportation Needs (11/27/2023)   PRAPARE - Administrator, Civil Service (Medical): No    Lack of Transportation (Non-Medical): No  Physical Activity: Inactive (11/27/2023)   Exercise Vital Sign    Days of Exercise per Week: 0 days    Minutes of Exercise per Session: 0 min  Stress: No Stress Concern Present (11/27/2023)   [employer] of Occupational Health -  Occupational Stress Questionnaire    Feeling of Stress : Not at all  Social Connections: Moderately Isolated (11/27/2023)   Social Connection and Isolation Panel    Frequency of Communication with Friends and Family: More than three times a week    Frequency of Social Gatherings with Friends and Family: Three times a week    Attends Religious Services: More than 4 times per year    Active Member of Clubs or  Organizations: No    Attends Banker Meetings: Never    Marital Status: Widowed  Intimate Partner Violence: Not At Risk (11/27/2023)   Humiliation, Afraid, Rape, and Kick questionnaire    Fear of Current or Ex-Partner: No    Emotionally Abused: No    Physically Abused: No    Sexually Abused: No   Family History  Problem Relation Age of Onset   Pneumonia Mother    Brain cancer Father    Tremor Father    Arthritis Sister    COPD Sister    Emphysema Brother    COPD Brother    Tremor Brother    Lung cancer Brother    Heart disease Brother    Liver cancer Brother    Diabetes Other         1 child has diabetes at age 110 and the other 4 are healthy    Objective: Office vital signs reviewed. BP 126/79   Pulse 83   Temp 98.7 F (37.1 C)   Ht 5' 4 (1.626 m)   Wt 152 lb (68.9 kg)   SpO2 97%   BMI 26.09 kg/m   Physical Examination:  General: Awake, alert, well-nourished elderly female, No acute distress HEENT: Sclera white.  Moist mucous membranes Cardio: regular rate and rhythm, S1S2 heard, soft murmurs appreciated Pulm: clear to auscultation bilaterally, no wheezes, rhonchi or rales; normal work of breathing on room air Extremities: She has pitting edema of bilateral extremities but she has much tighter leg on the left compared to the right and calf girth measures 14 cm on the left and 13 cm on the right.  She has hyperemia of the left lower extremity below the knee.  It is tender to touch.  No palpable cords  Assessment/ Plan: 89 y.o. female   Diabetes  mellitus treated with oral medication (HCC) - Plan: Bayer DCA Hb A1c Waived, CMP14+EGFR  Hyperlipidemia associated with type 2 diabetes mellitus (HCC) - Plan: Lipid Panel, CMP14+EGFR  Hypertension associated with diabetes (HCC) - Plan: CMP14+EGFR  Localized swelling of left lower leg - Plan: D-dimer, quantitative, US  Venous Img Lower Unilateral Left  Check A1c.  Lipid panel and liver function test also collected  D-dimer ordered for localized swelling of left lower extremity as her daughter did not feel like she could get her to an imaging center today.  Since this has been ongoing for several weeks, we will hold off on ultrasound until Monday but discussed that this would probably be a good idea for her to have done.  She understands red flag signs and symptoms warranting further evaluation in the ER  Norene CHRISTELLA Fielding, DO [facility] [phone]

## 2024-03-19 LAB — CMP14+EGFR
ALT: 8 IU/L (ref 0–32)
AST: 12 IU/L (ref 0–40)
Albumin: 4.1 g/dL (ref 3.6–4.6)
Alkaline Phosphatase: 66 IU/L (ref 44–121)
BUN/Creatinine Ratio: 9 — ABNORMAL LOW (ref 12–28)
BUN: 9 mg/dL — ABNORMAL LOW (ref 10–36)
Bilirubin Total: 0.5 mg/dL (ref 0.0–1.2)
CO2: 23 mmol/L (ref 20–29)
Calcium: 9.5 mg/dL (ref 8.7–10.3)
Chloride: 96 mmol/L (ref 96–106)
Creatinine, Ser: 0.97 mg/dL (ref 0.57–1.00)
Globulin, Total: 2.3 g/dL (ref 1.5–4.5)
Glucose: 293 mg/dL — ABNORMAL HIGH (ref 70–99)
Potassium: 2.8 mmol/L — ABNORMAL LOW (ref 3.5–5.2)
Sodium: 140 mmol/L (ref 134–144)
Total Protein: 6.4 g/dL (ref 6.0–8.5)
eGFR: 54 mL/min/1.73 — ABNORMAL LOW (ref >59)

## 2024-03-19 LAB — LIPID PANEL
Chol/HDL Ratio: 3.3 ratio (ref 0.0–4.4)
Cholesterol, Total: 140 mg/dL (ref 100–199)
HDL: 43 mg/dL (ref >39)
LDL Chol Calc (NIH): 59 mg/dL (ref 0–99)
Triglycerides: 238 mg/dL — ABNORMAL HIGH (ref 0–149)
VLDL Cholesterol Cal: 38 mg/dL (ref 5–40)

## 2024-03-19 LAB — D-DIMER, QUANTITATIVE: D-DIMER: 2.29 mg{FEU}/L — ABNORMAL HIGH (ref 0.00–0.49)

## 2024-03-21 ENCOUNTER — Ambulatory Visit (HOSPITAL_COMMUNITY)
Admission: RE | Admit: 2024-03-21 | Discharge: 2024-03-21 | Disposition: A | Discharge status: Home / Self Care | Source: Ambulatory Visit | Attending: Family Medicine | Admitting: Family Medicine

## 2024-03-21 DIAGNOSIS — R2242 Localized swelling, mass and lump, left lower limb: Secondary | ICD-10-CM | POA: Diagnosis not present

## 2024-03-21 DIAGNOSIS — M7989 Other specified soft tissue disorders: Secondary | ICD-10-CM | POA: Diagnosis not present

## 2024-03-21 MED ORDER — POTASSIUM CHLORIDE CRYS ER 20 MEQ PO TBCR: 20.0000 meq | EXTENDED_RELEASE_TABLET | Freq: Two times a day (BID) | ORAL | 0 refills | Status: DC

## 2024-03-22 ENCOUNTER — Telehealth: Payer: Self-pay

## 2024-03-22 NOTE — Telephone Encounter (Signed)
 Discussed with pt's daughter. LS

## 2024-03-22 NOTE — Telephone Encounter (Signed)
 Copied from CRM [id number]. Topic: General - Other >> Mar 22, 2024  9:11 AM Rosaria BRAVO wrote: Reason for CRM: Pt's daughter Apolinar Side is requesting to speak to the clinic regarding US  results please advise  Best contact: [phone]

## 2024-03-25 ENCOUNTER — Other Ambulatory Visit

## 2024-03-25 DIAGNOSIS — E876 Hypokalemia: Secondary | ICD-10-CM | POA: Diagnosis not present

## 2024-03-26 LAB — BASIC METABOLIC PANEL WITH GFR
BUN/Creatinine Ratio: 8 — ABNORMAL LOW (ref 12–28)
BUN: 7 mg/dL — ABNORMAL LOW (ref 10–36)
CO2: 25 mmol/L (ref 20–29)
Calcium: 8.6 mg/dL — ABNORMAL LOW (ref 8.7–10.3)
Chloride: 97 mmol/L (ref 96–106)
Creatinine, Ser: 0.88 mg/dL (ref 0.57–1.00)
Glucose: 231 mg/dL — ABNORMAL HIGH (ref 70–99)
Potassium: 3.1 mmol/L — ABNORMAL LOW (ref 3.5–5.2)
Sodium: 140 mmol/L (ref 134–144)
eGFR: 61 mL/min/1.73 (ref >59)

## 2024-03-26 LAB — MAGNESIUM: Magnesium: 1.1 mg/dL — ABNORMAL LOW (ref 1.6–2.3)

## 2024-03-28 ENCOUNTER — Ambulatory Visit: Payer: Self-pay | Admitting: Family Medicine

## 2024-03-28 DIAGNOSIS — E876 Hypokalemia: Secondary | ICD-10-CM

## 2024-03-28 MED ORDER — POTASSIUM CHLORIDE CRYS ER 20 MEQ PO TBCR
20.0000 meq | EXTENDED_RELEASE_TABLET | Freq: Two times a day (BID) | ORAL | 0 refills | Status: DC
Start: 2024-03-28 — End: 2024-07-19

## 2024-03-28 MED ORDER — MAGNESIUM OXIDE -MG SUPPLEMENT 400 (240 MG) MG PO TABS: 400.0000 mg | ORAL_TABLET | Freq: Every day | ORAL | 3 refills | Status: AC

## 2024-04-07 ENCOUNTER — Ambulatory Visit: Payer: Self-pay
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
## 2024-04-08 NOTE — Telephone Encounter (Signed)
 FYI noted going to the urgent care

## 2024-04-27 ENCOUNTER — Other Ambulatory Visit: Payer: Self-pay | Admitting: Family Medicine

## 2024-04-27 DIAGNOSIS — E1159 Type 2 diabetes mellitus with other circulatory complications: Secondary | ICD-10-CM

## 2024-07-19 ENCOUNTER — Encounter: Payer: Self-pay | Admitting: Family Medicine

## 2024-07-19 ENCOUNTER — Ambulatory Visit: Payer: Self-pay | Admitting: Family Medicine

## 2024-07-19 VITALS — BP 194/96 | HR 81 | Temp 97.1°F | Ht 62.0 in | Wt 147.5 lb

## 2024-07-19 DIAGNOSIS — E1159 Type 2 diabetes mellitus with other circulatory complications: Secondary | ICD-10-CM

## 2024-07-19 DIAGNOSIS — Z23 Encounter for immunization: Secondary | ICD-10-CM

## 2024-07-19 DIAGNOSIS — Z7984 Long term (current) use of oral hypoglycemic drugs: Secondary | ICD-10-CM

## 2024-07-19 DIAGNOSIS — E119 Type 2 diabetes mellitus without complications: Secondary | ICD-10-CM | POA: Diagnosis not present

## 2024-07-19 DIAGNOSIS — E1169 Type 2 diabetes mellitus with other specified complication: Secondary | ICD-10-CM

## 2024-07-19 DIAGNOSIS — E785 Hyperlipidemia, unspecified: Secondary | ICD-10-CM

## 2024-07-19 DIAGNOSIS — E876 Hypokalemia: Secondary | ICD-10-CM | POA: Diagnosis not present

## 2024-07-19 DIAGNOSIS — I152 Hypertension secondary to endocrine disorders: Secondary | ICD-10-CM | POA: Diagnosis not present

## 2024-07-19 LAB — BAYER DCA HB A1C WAIVED: HB A1C (BAYER DCA - WAIVED): 8.3 % — ABNORMAL HIGH (ref 4.8–5.6)

## 2024-07-19 MED ORDER — DILTIAZEM HCL ER COATED BEADS 180 MG PO CP24: ORAL_CAPSULE | ORAL | 3 refills | Status: AC

## 2024-07-19 MED ORDER — LISINOPRIL 40 MG PO TABS
40.0000 mg | ORAL_TABLET | Freq: Every day | ORAL | 3 refills | Status: AC
Start: 2024-07-19 — End: ?

## 2024-07-19 NOTE — Patient Instructions (Signed)
 Ok to back off the Magnesium  to just 1 tablet twice weekly. Watch that blood pressure. Goal less than 150/90.

## 2024-07-19 NOTE — Progress Notes (Signed)
 Subjective: CC:DM PCP: Jolinda Norene HERO, DO YEP:Debra Phillips is a 89 y.o. female presenting to clinic today for:  Type 2 Diabetes with hypertension, hyperlipidemia with hypokalemia and hypomagnesemia:  Compliant with metformin , Januvia , Pravachol , lisinopril  and diltiazem .  Needs refills on diltiazem .  She does not restrict diet or carbohydrates.  She is accompanied today by her daughter  ROS: Has poor vision at baseline and has macular degeneration for which she sees a specialist once yearly.  No chest pain, shortness of breath, falls.  Has edema but this seems to be improved with use of compression hose.  No hypoglycemia reported.  She has had some loose stools with use of magnesium  which was found to be deficient on last lab draw.   Diabetes Health Maintenance Due  Topic Date Due   OPHTHALMOLOGY EXAM  03/09/2024   FOOT EXAM  03/15/2024   HEMOGLOBIN A1C  09/18/2024    ROS: Per HPI  Allergies  Allergen Reactions   Codeine Nausea And Vomiting and Other (See Comments)   Doxycycline Hives   Other Nausea Only    Almost all antibiotics   Sulfonamide Derivatives Hives   Vancomycin Itching   Farxiga  [Dapagliflozin ]     Recurrent UTIs   Past Medical History:  Diagnosis Date   Abnormality of gait 01/29/2016   Aortic root dilatation    Mild   Cataract    Diabetes mellitus without complication (HCC)    type 2   Dyslipidemia    Dyspnea    with exertion   Dysrhythmia    hx of occ palpitations   Esophageal stricture    s/p dilation   GERD (gastroesophageal reflux disease)    Headache    History of hiatal hernia    Hyperlipidemia    Hypertension    Macular pucker, left eye    Osteoarthritis    Osteoporosis    Tinnitus    left ear last week or so    Current Outpatient Medications:    Artificial Saliva (BIOTENE DRY MOUTH MOISTURIZING) SOLN, Use as directed 5 mLs in the mouth or throat at bedtime., Disp: 44.3 mL, Rfl: 1   aspirin  EC 81 MG tablet, Take 81 mg by  mouth daily., Disp: , Rfl:    Cholecalciferol  (VITAMIN D3) 10 MCG (400 UNIT) CAPS, Take by mouth., Disp: , Rfl:    estradiol  (ESTRACE ) 0.1 MG/GM vaginal cream, APPLY A PEA SIZE AMOUNT PER VAGINA 2-3 NIGHTS A WEEK -FOR VAGINAL USE-, Disp: 42.5 g, Rfl: 6   fluticasone  (FLONASE ) 50 MCG/ACT nasal spray, Place 2 sprays into both nostrils daily., Disp: 16 g, Rfl: 6   lisinopril  (ZESTRIL ) 40 MG tablet, TAKE ONE TABLET ONCE DAILY FOR BLOOD PRESSURE, Disp: 90 tablet, Rfl: 0   magnesium  oxide (MAGOX 400) 400 (240 Mg) MG tablet, Take 1 tablet (400 mg total) by mouth daily., Disp: 90 tablet, Rfl: 3   metFORMIN  (GLUCOPHAGE ) 1000 MG tablet, Take 1 tablet (1,000 mg total) by mouth daily with breakfast., Disp: 90 tablet, Rfl: 4   mirabegron  ER (MYRBETRIQ ) 50 MG TB24 tablet, Take 1 tablet (50 mg total) by mouth daily., Disp: 90 tablet, Rfl: 4   nitroGLYCERIN  (NITROSTAT ) 0.4 MG SL tablet, Place 1 tablet (0.4 mg total) under the tongue every 5 (five) minutes as needed for chest pain., Disp: 25 tablet, Rfl: 3   pantoprazole  (PROTONIX ) 40 MG tablet, Take 1 tablet (40 mg total) by mouth daily., Disp: 90 tablet, Rfl: 4   pravastatin  (PRAVACHOL ) 40 MG tablet, Take  1 tablet (40 mg total) by mouth daily., Disp: 90 tablet, Rfl: 3   sitaGLIPtin  (JANUVIA ) 50 MG tablet, TAKE ONE TABLET ONCE DAILY FOR SUGAR., Disp: 90 tablet, Rfl: 4   sodium chloride  (OCEAN) 0.65 % SOLN nasal spray, Place 1 spray into both nostrils as needed for congestion., Disp: 30 mL, Rfl: 0   diltiazem  (CARDIZEM  CD) 180 MG 24 hr capsule, TAKE ONE CAPSULE ONCE DAILY FOR BLOOD PRESSURE, Disp: 100 capsule, Rfl: 3 Social History   Socioeconomic History   Marital status: Widowed    Spouse name: Not on file   Number of children: 5   Years of education: 5   Highest education level: Not on file  Occupational History   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    Comment: Retired  Tobacco Use   Smoking status: Never   Smokeless tobacco: Never   Vaping Use   Vaping status: Never Used  Substance and Sexual Activity   Alcohol  use: No   Drug use: No   Sexual activity: Not Currently  Other Topics Concern   Not on file  Social History Narrative   Pt lives at home alone   Husband w/ Alzheimer's passed away   Right-handed   Drinks 1-2 cups of coffee daily   Social Drivers of Corporate Investment Banker Strain: Low Risk  (11/27/2023)   Overall Financial Resource Strain (CARDIA)    Difficulty of Paying Living Expenses: Not hard at all  Food Insecurity: No Food Insecurity (11/27/2023)   Hunger Vital Sign    Worried About Running Out of Food in the Last Year: Never true    Ran Out of Food in the Last Year: Never true  Transportation Needs: No Transportation Needs (11/27/2023)   PRAPARE - Administrator, Civil Service (Medical): No    Lack of Transportation (Non-Medical): No  Physical Activity: Inactive (11/27/2023)   Exercise Vital Sign    Days of Exercise per Week: 0 days    Minutes of Exercise per Session: 0 min  Stress: No Stress Concern Present (11/27/2023)   [employer] of Occupational Health - Occupational Stress Questionnaire    Feeling of Stress : Not at all  Social Connections: Moderately Isolated (11/27/2023)   Social Connection and Isolation Panel    Frequency of Communication with Friends and Family: More than three times a week    Frequency of Social Gatherings with Friends and Family: Three times a week    Attends Religious Services: More than 4 times per year    Active Member of Clubs or Organizations: No    Attends Banker Meetings: Never    Marital Status: Widowed  Intimate Partner Violence: Not At Risk (11/27/2023)   Humiliation, Afraid, Rape, and Kick questionnaire    Fear of Current or Ex-Partner: No    Emotionally Abused: No    Physically Abused: No    Sexually Abused: No   Family History  Problem Relation Age of Onset   Pneumonia Mother    Brain cancer Father    Tremor Father     Arthritis Sister    COPD Sister    Emphysema Brother    COPD Brother    Tremor Brother    Lung cancer Brother    Heart disease Brother    Liver cancer Brother    Diabetes Other         1 child has diabetes at age 82 and the other 4 are healthy  Objective: Office vital signs reviewed. BP (!) 194/96   Pulse 81   Temp (!) 97.1 F (36.2 C)   Ht 5' 2 (1.575 m)   Wt 147 lb 8 oz (66.9 kg)   SpO2 98%   BMI 26.98 kg/m   Physical Examination:  General: Awake, alert, well-nourished, well-appearing elderly female, No acute distress HEENT: Sclera white.  Moist mucous membranes Cardio: regular rate and rhythm, S1S2 heard, no murmurs appreciated Pulm: clear to auscultation bilaterally, no wheezes, rhonchi or rales; normal work of breathing on room air GI: soft, non-tender, non-distended, bowel sounds present x4, no hepatomegaly, no splenomegaly, no masses Extremities: warm, well perfused, trace to +1 lower extremity edema, no cyanosis or clubbing; +2 pulses bilaterally MSK: Ambulates with some assistance  Diabetic Foot Exam - Simple   Simple Foot Form Diabetic Foot exam was performed with the following findings: Yes 07/19/2024  1:39 PM  Visual Inspection No deformities, no ulcerations, no other skin breakdown bilaterally: Yes Sensation Testing Intact to touch and monofilament testing bilaterally: Yes Pulse Check Posterior Tibialis and Dorsalis pulse intact bilaterally: Yes Comments Trace edema present     Lab Results  Component Value Date   HGBA1C 8.3 (H) 03/18/2024    Assessment/ Plan: 89 y.o. female   Diabetes mellitus treated with oral medication (HCC) - Plan: CMP14+EGFR, Bayer DCA Hb A1c Waived  Hyperlipidemia associated with type 2 diabetes mellitus (HCC) - Plan: CMP14+EGFR  Hypertension associated with diabetes (HCC) - Plan: CMP14+EGFR, diltiazem  (CARDIZEM  CD) 180 MG 24 hr capsule, lisinopril  (ZESTRIL ) 40 MG tablet  Hypokalemia - Plan: CMP14+EGFR,  Magnesium   Okay to back down on magnesium  given abdominal cramping and loose stools.  Can take 1-2 times per week.  Will check magnesium  level as well as potassium level.  Check A1c.  Last A1c was above goal for age but if we can get her somewhere around 7.5 I think that would be acceptable.  Blood pressure is not at goal and I suspect this is likely due to high salt lunch intake prior to arrival and late use of antihypertensive.  She typically has very well-controlled blood pressure.  I have renewed her antihypertensive today and I will see her back in 24 hours for recheck to ensure that we are not allowing her to go untreated.  Reinforced salt restriction to her daughter and we will see her back tomorrow for blood pressure recheck.  If needed, could consider advancing the diltiazem .   Norene CHRISTELLA Fielding, DO [facility] [phone]

## 2024-07-20 ENCOUNTER — Ambulatory Visit: Payer: Self-pay | Admitting: Family Medicine

## 2024-07-20 ENCOUNTER — Ambulatory Visit (INDEPENDENT_AMBULATORY_CARE_PROVIDER_SITE_OTHER): Admitting: *Deleted

## 2024-07-20 DIAGNOSIS — I10 Essential (primary) hypertension: Secondary | ICD-10-CM

## 2024-07-20 LAB — CMP14+EGFR
ALT: 8 IU/L (ref 0–32)
AST: 13 IU/L (ref 0–40)
Albumin: 4.2 g/dL (ref 3.6–4.6)
Alkaline Phosphatase: 69 IU/L (ref 48–129)
BUN/Creatinine Ratio: 7 — ABNORMAL LOW (ref 12–28)
BUN: 7 mg/dL — ABNORMAL LOW (ref 10–36)
Bilirubin Total: 0.7 mg/dL (ref 0.0–1.2)
CO2: 28 mmol/L (ref 20–29)
Calcium: 9.4 mg/dL (ref 8.7–10.3)
Chloride: 95 mmol/L — ABNORMAL LOW (ref 96–106)
Creatinine, Ser: 0.96 mg/dL (ref 0.57–1.00)
Globulin, Total: 2.1 g/dL (ref 1.5–4.5)
Glucose: 230 mg/dL — ABNORMAL HIGH (ref 70–99)
Potassium: 3.6 mmol/L (ref 3.5–5.2)
Sodium: 140 mmol/L (ref 134–144)
Total Protein: 6.3 g/dL (ref 6.0–8.5)
eGFR: 55 mL/min/1.73 — ABNORMAL LOW (ref >59)

## 2024-07-20 LAB — MAGNESIUM: Magnesium: 1.4 mg/dL — ABNORMAL LOW (ref 1.6–2.3)

## 2024-07-20 NOTE — Progress Notes (Signed)
 Patient is in office today for a blood pressure check. Patient blood pressure is 170/90, patient is complaining of chest pain that comes and goes along with body pain that comes and goes. Rechecked blood pressure 5 minutes later, blood pressure reading is 156/77.

## 2024-11-16 ENCOUNTER — Ambulatory Visit: Admitting: Family Medicine

## 2024-11-28 ENCOUNTER — Ambulatory Visit: Payer: Self-pay
